# Patient Record
Sex: Male | Born: 1965 | Race: White | Hispanic: No | Marital: Married | Smoking: Never smoker
Health system: Southern US, Community
[De-identification: ages and names within clinical notes are randomized; demographics above are authoritative.]

## PROBLEM LIST (undated history)

## (undated) DIAGNOSIS — D802 Selective deficiency of immunoglobulin A [IgA]: Secondary | ICD-10-CM

## (undated) DIAGNOSIS — I10 Essential (primary) hypertension: Secondary | ICD-10-CM

## (undated) DIAGNOSIS — E119 Type 2 diabetes mellitus without complications: Secondary | ICD-10-CM

## (undated) DIAGNOSIS — E785 Hyperlipidemia, unspecified: Secondary | ICD-10-CM

## (undated) DIAGNOSIS — H9190 Unspecified hearing loss, unspecified ear: Secondary | ICD-10-CM

## (undated) DIAGNOSIS — G4730 Sleep apnea, unspecified: Secondary | ICD-10-CM

## (undated) DIAGNOSIS — L03116 Cellulitis of left lower limb: Secondary | ICD-10-CM

## (undated) DIAGNOSIS — Z8719 Personal history of other diseases of the digestive system: Secondary | ICD-10-CM

## (undated) DIAGNOSIS — F32A Depression, unspecified: Secondary | ICD-10-CM

## (undated) DIAGNOSIS — L02416 Cutaneous abscess of left lower limb: Secondary | ICD-10-CM

## (undated) DIAGNOSIS — M1990 Unspecified osteoarthritis, unspecified site: Secondary | ICD-10-CM

## (undated) DIAGNOSIS — F329 Major depressive disorder, single episode, unspecified: Secondary | ICD-10-CM

## (undated) HISTORY — PX: TOOTH EXTRACTION: SUR596

---

## 2005-07-16 ENCOUNTER — Emergency Department: Payer: Self-pay | Admitting: Emergency Medicine

## 2006-02-24 ENCOUNTER — Emergency Department: Payer: Self-pay | Admitting: Emergency Medicine

## 2006-03-02 ENCOUNTER — Emergency Department: Payer: Self-pay | Admitting: Emergency Medicine

## 2006-11-27 ENCOUNTER — Ambulatory Visit: Payer: Self-pay | Admitting: Family Medicine

## 2006-12-27 ENCOUNTER — Ambulatory Visit: Payer: Self-pay | Admitting: Internal Medicine

## 2007-01-03 ENCOUNTER — Ambulatory Visit: Payer: Self-pay | Admitting: Internal Medicine

## 2007-01-07 ENCOUNTER — Ambulatory Visit: Payer: Self-pay | Admitting: Internal Medicine

## 2007-02-02 ENCOUNTER — Ambulatory Visit: Payer: Self-pay

## 2007-12-08 ENCOUNTER — Emergency Department: Payer: Self-pay | Admitting: Emergency Medicine

## 2010-10-02 ENCOUNTER — Ambulatory Visit: Payer: Self-pay | Admitting: Family Medicine

## 2010-10-04 ENCOUNTER — Ambulatory Visit: Payer: Self-pay | Admitting: Family Medicine

## 2010-10-06 ENCOUNTER — Ambulatory Visit: Payer: Self-pay | Admitting: Family Medicine

## 2011-12-07 ENCOUNTER — Ambulatory Visit: Payer: Self-pay | Admitting: Family Medicine

## 2016-10-22 ENCOUNTER — Emergency Department: Payer: BLUE CROSS/BLUE SHIELD

## 2016-10-22 ENCOUNTER — Inpatient Hospital Stay
Admission: EM | Admit: 2016-10-22 | Discharge: 2016-10-26 | DRG: 603 | Disposition: A | Discharge status: Home / Self Care | Payer: BLUE CROSS/BLUE SHIELD | Attending: Specialist | Admitting: Specialist

## 2016-10-22 DIAGNOSIS — Z888 Allergy status to other drugs, medicaments and biological substances status: Secondary | ICD-10-CM | POA: Diagnosis not present

## 2016-10-22 DIAGNOSIS — I10 Essential (primary) hypertension: Secondary | ICD-10-CM | POA: Diagnosis present

## 2016-10-22 DIAGNOSIS — Z833 Family history of diabetes mellitus: Secondary | ICD-10-CM

## 2016-10-22 DIAGNOSIS — E782 Mixed hyperlipidemia: Secondary | ICD-10-CM | POA: Diagnosis present

## 2016-10-22 DIAGNOSIS — Z7984 Long term (current) use of oral hypoglycemic drugs: Secondary | ICD-10-CM | POA: Diagnosis not present

## 2016-10-22 DIAGNOSIS — L0390 Cellulitis, unspecified: Secondary | ICD-10-CM | POA: Diagnosis present

## 2016-10-22 DIAGNOSIS — E785 Hyperlipidemia, unspecified: Secondary | ICD-10-CM | POA: Diagnosis present

## 2016-10-22 DIAGNOSIS — E119 Type 2 diabetes mellitus without complications: Secondary | ICD-10-CM | POA: Diagnosis present

## 2016-10-22 DIAGNOSIS — L03116 Cellulitis of left lower limb: Secondary | ICD-10-CM | POA: Diagnosis present

## 2016-10-22 DIAGNOSIS — G4733 Obstructive sleep apnea (adult) (pediatric): Secondary | ICD-10-CM | POA: Diagnosis present

## 2016-10-22 DIAGNOSIS — F329 Major depressive disorder, single episode, unspecified: Secondary | ICD-10-CM | POA: Diagnosis present

## 2016-10-22 DIAGNOSIS — Z6841 Body Mass Index (BMI) 40.0 and over, adult: Secondary | ICD-10-CM | POA: Diagnosis not present

## 2016-10-22 DIAGNOSIS — Z881 Allergy status to other antibiotic agents status: Secondary | ICD-10-CM

## 2016-10-22 DIAGNOSIS — N179 Acute kidney failure, unspecified: Secondary | ICD-10-CM | POA: Diagnosis present

## 2016-10-22 DIAGNOSIS — Z9989 Dependence on other enabling machines and devices: Secondary | ICD-10-CM | POA: Diagnosis not present

## 2016-10-22 DIAGNOSIS — N400 Enlarged prostate without lower urinary tract symptoms: Secondary | ICD-10-CM | POA: Diagnosis present

## 2016-10-22 DIAGNOSIS — Z794 Long term (current) use of insulin: Secondary | ICD-10-CM

## 2016-10-22 HISTORY — DX: Essential (primary) hypertension: I10

## 2016-10-22 HISTORY — DX: Sleep apnea, unspecified: G47.30

## 2016-10-22 HISTORY — DX: Type 2 diabetes mellitus without complications: E11.9

## 2016-10-22 HISTORY — DX: Depression, unspecified: F32.A

## 2016-10-22 HISTORY — DX: Hyperlipidemia, unspecified: E78.5

## 2016-10-22 HISTORY — DX: Major depressive disorder, single episode, unspecified: F32.9

## 2016-10-22 LAB — CBC WITH DIFFERENTIAL/PLATELET
BASOS ABS: 0 10*3/uL (ref 0–0.1)
BASOS PCT: 0 %
EOS ABS: 0 10*3/uL (ref 0–0.7)
EOS PCT: 0 %
HCT: 34 % — ABNORMAL LOW (ref 40.0–52.0)
Hemoglobin: 11.7 g/dL — ABNORMAL LOW (ref 13.0–18.0)
Lymphocytes Relative: 7 %
Lymphs Abs: 0.6 10*3/uL — ABNORMAL LOW (ref 1.0–3.6)
MCH: 29.3 pg (ref 26.0–34.0)
MCHC: 34.5 g/dL (ref 32.0–36.0)
MCV: 84.9 fL (ref 80.0–100.0)
MONO ABS: 0.8 10*3/uL (ref 0.2–1.0)
Monocytes Relative: 9 %
Neutro Abs: 7.5 10*3/uL — ABNORMAL HIGH (ref 1.4–6.5)
Neutrophils Relative %: 84 %
PLATELETS: 177 10*3/uL (ref 150–440)
RBC: 4.01 MIL/uL — ABNORMAL LOW (ref 4.40–5.90)
RDW: 14.7 % — AB (ref 11.5–14.5)
WBC: 8.9 10*3/uL (ref 3.8–10.6)

## 2016-10-22 LAB — COMPREHENSIVE METABOLIC PANEL
ALT: 28 U/L (ref 17–63)
AST: 33 U/L (ref 15–41)
Albumin: 3.4 g/dL — ABNORMAL LOW (ref 3.5–5.0)
Alkaline Phosphatase: 38 U/L (ref 38–126)
Anion gap: 8 (ref 5–15)
BILIRUBIN TOTAL: 0.7 mg/dL (ref 0.3–1.2)
BUN: 49 mg/dL — AB (ref 6–20)
CO2: 26 mmol/L (ref 22–32)
CREATININE: 2.29 mg/dL — AB (ref 0.61–1.24)
Calcium: 9.2 mg/dL (ref 8.9–10.3)
Chloride: 101 mmol/L (ref 101–111)
GFR calc Af Amer: 37 mL/min — ABNORMAL LOW (ref >60)
GFR, EST NON AFRICAN AMERICAN: 32 mL/min — AB (ref >60)
Glucose, Bld: 139 mg/dL — ABNORMAL HIGH (ref 65–99)
Potassium: 3.5 mmol/L (ref 3.5–5.1)
Sodium: 135 mmol/L (ref 135–145)
TOTAL PROTEIN: 6.8 g/dL (ref 6.5–8.1)

## 2016-10-22 LAB — URINALYSIS, COMPLETE (UACMP) WITH MICROSCOPIC
Bacteria, UA: NONE SEEN
Bilirubin Urine: NEGATIVE
GLUCOSE, UA: NEGATIVE mg/dL
Hgb urine dipstick: NEGATIVE
Ketones, ur: NEGATIVE mg/dL
Leukocytes, UA: NEGATIVE
Nitrite: NEGATIVE
PROTEIN: NEGATIVE mg/dL
Specific Gravity, Urine: 1.017 (ref 1.005–1.030)
Squamous Epithelial / LPF: NONE SEEN
pH: 5 (ref 5.0–8.0)

## 2016-10-22 LAB — GLUCOSE, CAPILLARY: GLUCOSE-CAPILLARY: 165 mg/dL — AB (ref 65–99)

## 2016-10-22 LAB — LIPASE, BLOOD: LIPASE: 27 U/L (ref 11–51)

## 2016-10-22 LAB — INFLUENZA PANEL BY PCR (TYPE A & B)
INFLBPCR: NEGATIVE
Influenza A By PCR: NEGATIVE

## 2016-10-22 MED ORDER — SODIUM CHLORIDE 0.9 % IV BOLUS (SEPSIS)
1000.0000 mL | Freq: Once | INTRAVENOUS | Status: AC
  Administered 2016-10-22: 1000 mL via INTRAVENOUS

## 2016-10-22 MED ORDER — DEXTROSE 5 % IV SOLN
2.0000 g | Freq: Three times a day (TID) | INTRAVENOUS | Status: DC
  Filled 2016-10-22 (×2): qty 2

## 2016-10-22 MED ORDER — LEVOFLOXACIN IN D5W 750 MG/150ML IV SOLN: 750.0000 mg | INTRAVENOUS | Status: DC

## 2016-10-22 MED ORDER — METOPROLOL TARTRATE 50 MG PO TABS
50.0000 mg | ORAL_TABLET | Freq: Two times a day (BID) | ORAL | Status: DC
  Administered 2016-10-23 – 2016-10-26 (×8): 50 mg via ORAL
  Filled 2016-10-22 (×8): qty 1

## 2016-10-22 MED ORDER — IOPAMIDOL (ISOVUE-300) INJECTION 61%: 30.0000 mL | Freq: Once | INTRAVENOUS | Status: DC | PRN

## 2016-10-22 MED ORDER — TRAZODONE HCL 50 MG PO TABS
150.0000 mg | ORAL_TABLET | Freq: Every day | ORAL | Status: DC
  Administered 2016-10-23 – 2016-10-25 (×4): 150 mg via ORAL
  Filled 2016-10-22 (×5): qty 3

## 2016-10-22 MED ORDER — METOCLOPRAMIDE HCL 5 MG/ML IJ SOLN
10.0000 mg | Freq: Once | INTRAMUSCULAR | Status: AC
  Administered 2016-10-22: 10 mg via INTRAVENOUS
  Filled 2016-10-22: qty 2

## 2016-10-22 MED ORDER — LEVOFLOXACIN IN D5W 750 MG/150ML IV SOLN
750.0000 mg | Freq: Once | INTRAVENOUS | Status: AC
  Administered 2016-10-22: 750 mg via INTRAVENOUS
  Filled 2016-10-22: qty 150

## 2016-10-22 MED ORDER — SODIUM CHLORIDE 0.9 % IV SOLN
1.0000 g | Freq: Three times a day (TID) | INTRAVENOUS | Status: DC
  Administered 2016-10-23 (×2): 1 g via INTRAVENOUS
  Filled 2016-10-22 (×4): qty 1

## 2016-10-22 MED ORDER — ONDANSETRON HCL 4 MG/2ML IJ SOLN: 4.0000 mg | Freq: Four times a day (QID) | INTRAMUSCULAR | Status: DC | PRN

## 2016-10-22 MED ORDER — AMLODIPINE BESYLATE 10 MG PO TABS
10.0000 mg | ORAL_TABLET | Freq: Every day | ORAL | Status: DC
  Administered 2016-10-23 – 2016-10-26 (×4): 10 mg via ORAL
  Filled 2016-10-22 (×4): qty 1

## 2016-10-22 MED ORDER — ACETAMINOPHEN 325 MG PO TABS
650.0000 mg | ORAL_TABLET | Freq: Four times a day (QID) | ORAL | Status: DC | PRN
  Filled 2016-10-22: qty 2

## 2016-10-22 MED ORDER — TERAZOSIN HCL 5 MG PO CAPS
10.0000 mg | ORAL_CAPSULE | Freq: Every day | ORAL | Status: DC
  Administered 2016-10-23 – 2016-10-26 (×4): 10 mg via ORAL
  Filled 2016-10-22 (×4): qty 2

## 2016-10-22 MED ORDER — ACETAMINOPHEN 500 MG PO TABS
1000.0000 mg | ORAL_TABLET | Freq: Once | ORAL | Status: AC
  Administered 2016-10-22: 1000 mg via ORAL
  Filled 2016-10-22: qty 2

## 2016-10-22 MED ORDER — SODIUM CHLORIDE 0.9 % IV SOLN
1250.0000 mg | INTRAVENOUS | Status: DC
  Filled 2016-10-22: qty 1250

## 2016-10-22 MED ORDER — RAMIPRIL 10 MG PO CAPS
10.0000 mg | ORAL_CAPSULE | Freq: Two times a day (BID) | ORAL | Status: DC
  Administered 2016-10-23 – 2016-10-26 (×8): 10 mg via ORAL
  Filled 2016-10-22 (×10): qty 1

## 2016-10-22 MED ORDER — VANCOMYCIN HCL IN DEXTROSE 1-5 GM/200ML-% IV SOLN
1000.0000 mg | Freq: Once | INTRAVENOUS | Status: AC
  Administered 2016-10-22: 1000 mg via INTRAVENOUS
  Filled 2016-10-22: qty 200

## 2016-10-22 MED ORDER — VANCOMYCIN HCL 10 G IV SOLR
1250.0000 mg | INTRAVENOUS | Status: DC
  Administered 2016-10-23: 02:00:00 1250 mg via INTRAVENOUS
  Filled 2016-10-22 (×2): qty 1250

## 2016-10-22 MED ORDER — ACETAMINOPHEN 650 MG RE SUPP: 650.0000 mg | Freq: Four times a day (QID) | RECTAL | Status: DC | PRN

## 2016-10-22 MED ORDER — OXYCODONE HCL 5 MG PO TABS: 5.0000 mg | ORAL_TABLET | ORAL | Status: DC | PRN

## 2016-10-22 MED ORDER — INSULIN ASPART 100 UNIT/ML ~~LOC~~ SOLN
0.0000 [IU] | Freq: Three times a day (TID) | SUBCUTANEOUS | Status: DC
  Administered 2016-10-23 (×3): 2 [IU] via SUBCUTANEOUS
  Administered 2016-10-23 – 2016-10-24 (×2): 1 [IU] via SUBCUTANEOUS
  Administered 2016-10-24 – 2016-10-25 (×6): 2 [IU] via SUBCUTANEOUS
  Administered 2016-10-25: 09:00:00 1 [IU] via SUBCUTANEOUS
  Administered 2016-10-26 (×2): 2 [IU] via SUBCUTANEOUS
  Filled 2016-10-22 (×14): qty 1

## 2016-10-22 MED ORDER — SODIUM CHLORIDE 0.9 % IV SOLN
INTRAVENOUS | Status: AC
  Administered 2016-10-23: 02:00:00 via INTRAVENOUS

## 2016-10-22 MED ORDER — HEPARIN SODIUM (PORCINE) 5000 UNIT/ML IJ SOLN
5000.0000 [IU] | Freq: Three times a day (TID) | INTRAMUSCULAR | Status: DC
  Administered 2016-10-23: 5000 [IU] via SUBCUTANEOUS
  Filled 2016-10-22: qty 1

## 2016-10-22 MED ORDER — ONDANSETRON HCL 4 MG PO TABS: 4.0000 mg | ORAL_TABLET | Freq: Four times a day (QID) | ORAL | Status: DC | PRN

## 2016-10-22 MED ORDER — DEXTROSE 5 % IV SOLN
2.0000 g | Freq: Once | INTRAVENOUS | Status: AC
  Administered 2016-10-22: 2 g via INTRAVENOUS
  Filled 2016-10-22: qty 2

## 2016-10-22 NOTE — ED Triage Notes (Signed)
Pt to ED c/o multiple complaints. C/o bilateral leg pain, reports has been running fevers. Also has hip pain, fell last week off the steps. Temperature 99.0 in Ed.

## 2016-10-22 NOTE — ED Notes (Signed)
Last BM on Friday. Pt states he was recently on a long car trip at the beginning of Sept from [place].

## 2016-10-22 NOTE — Progress Notes (Addendum)
Pharmacy Antibiotic Note  Patrick Duncan is a 51 y.o. male admitted on 10/22/2016 with cellulitis.  Pharmacy has been consulted for vancomycin, levofloxacin, and aztreonam dosing.  Plan: 1. Vancomycin 1 gm IV x 1 followed in approximately 6 hours (stacked dosing) by vancomycin 1.25 gm IV Q18H, predicted trough 12 mcg/mL. Pharmacy will continue to follow and adjust as needed to maintain trough 10 to 15 mcg/mL (no abscess noted at this time).   Vd 69.7 L, 0.05 hr-1, T1/2 13.7 hr  2. Levofloxacin 750 mg IV Q24H 3. Aztreonam 2 gm IV Q8H  Aztreonam and Levaquin changed to meropenem per attending.  Height: 5\' 7"  (170.2 cm) Weight: (!) 330 lb (149.7 kg) IBW/kg (Calculated) : 66.1  Temp (24hrs), Avg:99 F (37.2 C), Min:99 F (37.2 C), Max:99 F (37.2 C)   Recent Labs Lab 10/22/16 1620  WBC 8.9  CREATININE 2.29*    Estimated Creatinine Clearance: 55.5 mL/min (A) (by C-G formula based on SCr of 2.29 mg/dL (H)).    Allergies  Allergen Reactions  . Ampicillin Diarrhea  . Claritin [Loratadine] Other (See Comments)    Irritates throat    Thank you for allowing pharmacy to be a part of this patient's care.  Laural Benes, Pharm.D., BCPS Clinical Pharmacist 10/22/2016 6:21 PM

## 2016-10-22 NOTE — ED Notes (Signed)
Pt finished with contrast, attempted to call CT, no answer.

## 2016-10-22 NOTE — Progress Notes (Signed)
Azactam 2 gm tubed to ED main

## 2016-10-22 NOTE — ED Provider Notes (Signed)
[facility] Emergency Department Provider Note  ____________________________________________  Time seen: Approximately 3:49 PM  I have reviewed the triage vital signs and the nursing notes.   HISTORY  Chief Complaint Leg Pain and Headache    HPI Patrick Duncan is a 51 y.o. male with multiple complaints that started yesterday. He reports low back pain, I'm really worsen the right, bilateral lower leg pain, fever to 100.6 yesterday. Also has some mild shortness of breath and nonproductive cough and a generalized headache. Also has generalized abdominal pain. Not sure if his abdomen is more distended. No vomiting. Last bowel movement 2 days ago.  Recently on a long car trip driving back from [place] about a month ago. On returning home he fell off the stairs bruising the right side of his body.     Past Medical History:  Diagnosis Date  . Diabetes mellitus without complication ([place])   . Hypertension   morbid obesity Obstructive sleep apnea    There are no active problems to display for this patient.    History reviewed. No pertinent surgical history. tooth extraction  Prior to Admission medications   Medication Sig Start Date End Date Taking? Authorizing Provider  amLODipine (NORVASC) 10 MG tablet Take 10 mg by mouth daily. 05/18/16  Yes [provider]  chlorthalidone (HYGROTON) 25 MG tablet Take 25 mg by mouth daily. 05/18/16  Yes [provider]  fexofenadine (ALLEGRA) 180 MG tablet Take 180 mg by mouth daily.   Yes [provider]  fluticasone (FLONASE) 50 MCG/ACT nasal spray Place 2 sprays into both nostrils daily as needed. 05/04/16  Yes [provider]  glimepiride (AMARYL) 1 MG tablet Take 1 mg by mouth daily. 09/22/16  Yes [provider]  metFORMIN (GLUCOPHAGE-XR) 500 MG 24 hr tablet Take 500 mg by mouth 2 (two) times daily. 07/31/16  Yes [provider]  metoprolol tartrate (LOPRESSOR) 50  MG tablet Take 50 mg by mouth 2 (two) times daily. 08/23/16  Yes [provider]  pioglitazone (ACTOS) 30 MG tablet Take 30 mg by mouth daily. 07/27/16  Yes [provider]  pseudoephedrine-guaifenesin (MUCINEX D) 60-600 MG 12 hr tablet Take 1 tablet by mouth every 12 (twelve) hours.   Yes [provider]  ramipril (ALTACE) 10 MG capsule Take 10 mg by mouth 2 (two) times daily. 05/18/16  Yes [provider]  terazosin (HYTRIN) 10 MG capsule Take 10 mg by mouth daily.  05/18/16  Yes [provider]  traZODone (DESYREL) 150 MG tablet Take 150 mg by mouth at bedtime. 09/06/16  Yes [provider]  triamcinolone ointment (KENALOG) 0.1 % Apply 1 application topically daily. 10/18/16  Yes [provider]  trolamine salicylate (ASPERCREME) 10 % cream Apply 1 application topically as needed for muscle pain.   Yes [provider]  vitamin B-12 (CYANOCOBALAMIN) 1000 MCG tablet Take 1,000 mcg by mouth daily.   Yes [provider]  Current Medications   Prescription Sig. Disp. Refills Start Date End Date Status  fexofenadine (ALLEGRA) 180 MG tablet  Take 180 mg by mouth once daily.     Active  naproxen sodium (ALEVE, ANAPROX) 220 MG tablet  Take 220 mg by mouth as needed for Pain.     Active  cyanocobalamin (VITAMIN B12) 1000 MCG tablet  Take 1,000 mcg by mouth once daily.     Active  dextromethorphan-guaifenesin (MUCINEX DM) 30-600 mg ER tablet  Take 1 tablet by mouth as needed.  Active  fluticasone (FLONASE) 50 mcg/actuation nasal spray  USE 2 SPRAYS INTO EACH NOSTRIL ONCE DAILY AS DIRECTED BY PHYSICIAN. 16 g  16 05/04/2016  Active  chlorthalidone 25 MG tablet  Indications: Hypertension, essential, benign TAKE ONE (1) TABLET BY MOUTH ONCE DAILY 30 tablet  6 05/18/2016  Active  amLODIPine (NORVASC) 10 MG tablet  Indications: Hypertension, essential, benign TAKE ONE TABLET EVERY MORNING 30 tablet  6  05/18/2016  Active  ramipril (ALTACE) 10 MG capsule  Indications: Hypertension, essential, benign TAKE ONE CAPSULE TWICE A DAY 60 capsule  6 05/18/2016  Active  terazosin (HYTRIN) 10 MG capsule  Indications: Hypertension, essential, benign TAKE ONE (1) CAPSULE EACH DAY. 30 capsule  6 05/18/2016  Active  fenofibrate 160 MG tablet  Indications: Mixed hyperlipidemia TAKE ONE (1) TABLET BY MOUTH ONCE DAILY 30 tablet  6 06/01/2016  Active  HYDROcodone-acetaminophen (NORCO) 5-325 mg tablet  Take 1 tablet by mouth every 4 (four) hours as needed for Pain for up to 20 doses. 20 tablet  0 06/20/2016  Active  pioglitazone (ACTOS) 30 MG tablet  TAKE ONE (1) TABLET BY MOUTH ONCE DAILY 30 tablet  3 07/27/2016  Active  metFORMIN (GLUCOPHAGE-XR) 500 MG XR tablet  Take 1 tablet (500 mg total) by mouth 2 (two) times daily. 180 tablet  0 07/31/2016  Active  metoprolol tartrate (LOPRESSOR) 50 MG tablet  Indications: Hypertension, essential, benign TAKE ONE TABLET BY MOUTH TWICE DAILY. 60 tablet  3 08/23/2016  Active  traZODone (DESYREL) 150 MG tablet  Indications: Recurrent major depressive disorder, in partial remission (CMS-HCC) 1 TABLET BY MOUTH AT BEDTIME. 30 tablet  2 09/06/2016  Active  glimepiride (AMARYL) 1 MG tablet             Allergies Ampicillin and Claritin [loratadine] glyburide  No family history on file.  Social History Social History  Substance Use Topics  . Smoking status: Never Smoker  . Smokeless tobacco: Never Used  . Alcohol use No    Review of Systems  Constitutional:   positive fever and chills.  ENT:   No sore throat. No rhinorrhea. Cardiovascular:   No chest pain or syncope. Respiratory:   positive shortness of breath and nonproductive cough. Gastrointestinal:   positive generalized abdominal pain without vomiting and diarrhea.  Musculoskeletal:   positive bilateral lower leg pain, left greater than right. All other systems reviewed and are  negative except as documented above in ROS and HPI.  ____________________________________________   PHYSICAL EXAM:  VITAL SIGNS: ED Triage Vitals  Enc Vitals Group     BP 10/22/16 1514 (!) 161/66     Pulse Rate 10/22/16 1514 97     Resp 10/22/16 1514 18     Temp 10/22/16 1514 99 F (37.2 C)     Temp Source 10/22/16 1514 Oral     SpO2 10/22/16 1514 97 %     Weight 10/22/16 1515 (!) 330 lb (149.7 kg)     Height 10/22/16 1515 5\' 7"  (1.702 m)     Head Circumference --      Peak Flow --      Pain Score --      Pain Loc --      Pain Edu? --      Excl. in [place]? --     Vital signs reviewed, nursing assessments reviewed.   Constitutional:   Alert and oriented. Well appearing and in no distress. Eyes:   No scleral icterus.  EOMI. No nystagmus. No conjunctival  pallor. PERRL. ENT   Head:   Normocephalic and atraumatic.   Nose:   No congestion/rhinnorhea.    Mouth/Throat:   dry mucous membranes, no pharyngeal erythema. No peritonsillar mass.    Neck:   No meningismus. Full ROM. Hematological/Lymphatic/Immunilogical:   No cervical lymphadenopathy. Cardiovascular:   RRR. Symmetric bilateral radial and DP pulses.  No murmurs.  Respiratory:   Normal respiratory effort without tachypnea/retractions. Breath sounds are clear and equal bilaterally. No wheezes/rales/rhonchi. Gastrointestinal:   Soft ROS tenderness. Moderately distended with some tympany.. There is no CVA tenderness.  No rebound, rigidity, or guarding. Genitourinary:   deferred Musculoskeletal:   Normal range of motion in all extremities. No joint effusions.  No lower extremity tenderness.  No edema.no midline spinal tenderness. There is tenderness in the musculature of the right lower back. Left lower extremity has increased calf circumference, 2+ pitting edema, erythema and tenderness over the lower leg. No edema on the right leg. Neurologic:   Normal speech and language.  Motor grossly intact. No gross focal  neurologic deficits are appreciated.  Skin:    Skin is warm, dry with left leg erythema as above. There is scattered insect lesions on the right lower leg which are being treated by his dermatologist.  ____________________________________________    Reva Bores (pertinent positives/negatives) (all labs ordered are listed, but only abnormal results are displayed) Labs Reviewed  COMPREHENSIVE METABOLIC PANEL - Abnormal; Notable for the following:       Result Value   Glucose, Bld 139 (*)    BUN 49 (*)    Creatinine, Ser 2.29 (*)    Albumin 3.4 (*)    GFR calc non Af Amer 32 (*)    GFR calc Af Amer 37 (*)    All other components within normal limits  CBC WITH DIFFERENTIAL/PLATELET - Abnormal; Notable for the following:    RBC 4.01 (*)    Hemoglobin 11.7 (*)    HCT 34.0 (*)    RDW 14.7 (*)    Neutro Abs 7.5 (*)    Lymphs Abs 0.6 (*)    All other components within normal limits  URINALYSIS, COMPLETE (UACMP) WITH MICROSCOPIC - Abnormal; Notable for the following:    Color, Urine YELLOW (*)    APPearance CLEAR (*)    All other components within normal limits  CULTURE, BLOOD (ROUTINE X 2)  CULTURE, BLOOD (ROUTINE X 2)  LIPASE, BLOOD  INFLUENZA PANEL BY PCR (TYPE A & B)   ____________________________________________   EKG  interpreted by me Sinus rhythm rate of 94, normal axis and intervals. Normal QRS ST segments and T waves.  ____________________________________________    RADIOLOGY  Ct Abdomen Pelvis Wo Contrast  Result Date: 10/22/2016 CLINICAL DATA:  51 y/o M; back pain and right leg pain since yesterday. Low-grade fever. EXAM: CT ABDOMEN AND PELVIS WITHOUT CONTRAST TECHNIQUE: Multidetector CT imaging of the abdomen and pelvis was performed following the standard protocol without IV contrast. COMPARISON:  10/06/2010 CT abdomen and pelvis. Left lower extremity venous ultrasound dated 10/22/2016. FINDINGS: Lower chest: No acute abnormality. Hepatobiliary: No focal liver  abnormality is seen. Hepatic steatosis. No gallstones, gallbladder wall thickening, or biliary dilatation. Pancreas: Unremarkable. No pancreatic ductal dilatation or surrounding inflammatory changes. Spleen: Normal in size without focal abnormality. Adrenals/Urinary Tract: Adrenal glands are unremarkable. Kidneys are normal, without renal calculi, focal lesion, or hydronephrosis. Bladder is unremarkable. Stomach/Bowel: Stomach is within normal limits. Appendix appears normal. No evidence of bowel wall thickening, distention, or inflammatory changes. Vascular/Lymphatic: Left-greater-than-right  iliofemoral mild lymphadenopathy and surrounding retroperitoneal edema overlying the iliacus muscles bilaterally. On the left edema extends into the left groin along the iliofemoral vessels. Reproductive: Prostate is unremarkable. Other: Small paraumbilical hernia containing fat. No abdominopelvic ascites. Musculoskeletal: Mild osteoarthrosis of the hip joints. No acute fracture. IMPRESSION: 1. Mild left-greater-than-right iliofemoral lymphadenopathy and surrounding edema of uncertain significance. Findings probably represent lymphadenitis, possibly related to an infectious or inflammatory process of the groin or lower extremities. Edema follows the left iliofemoral vessels which may be tracking along lymphatics or be related to underlying vasculitis/ vessel thrombosis. 2. Hepatic steatosis. Electronically Signed   By: Kristine Garbe M.D.   On: 10/22/2016 19:45   Dg Chest 2 View  Result Date: 10/22/2016 CLINICAL DATA:  Dyspnea and cough EXAM: CHEST  2 VIEW COMPARISON:  07/16/2005 chest radiograph. FINDINGS: Mild enlargement of the cardiac silhouette. Otherwise normal mediastinal contour. No pneumothorax. No pleural effusion. Mild pulmonary edema. No acute consolidative airspace disease. IMPRESSION: Mild congestive heart failure. Electronically Signed   By: Ilona Sorrel M.D.   On: 10/22/2016 16:30   US Venous Img  Lower Unilateral Left  Result Date: 10/22/2016 CLINICAL DATA:  Left lower extremity swelling and pain since last night EXAM: LEFT LOWER EXTREMITY VENOUS DOPPLER ULTRASOUND TECHNIQUE: Gray-scale sonography with graded compression, as well as color Doppler and duplex ultrasound were performed to evaluate the lower extremity deep venous systems from the level of the common femoral vein and including the common femoral, femoral, profunda femoral, popliteal and calf veins including the posterior tibial, peroneal and gastrocnemius veins when visible. The superficial great saphenous vein was also interrogated. Spectral Doppler was utilized to evaluate flow at rest and with distal augmentation maneuvers in the common femoral, femoral and popliteal veins. COMPARISON:  None. FINDINGS: Contralateral Common Femoral Vein: Respiratory phasicity is normal and symmetric with the symptomatic side. No evidence of thrombus. Normal compressibility. Common Femoral Vein: No evidence of thrombus. Normal compressibility, respiratory phasicity and response to augmentation. Saphenofemoral Junction: No evidence of thrombus. Normal compressibility and flow on color Doppler imaging. Profunda Femoral Vein: No evidence of thrombus. Normal compressibility and flow on color Doppler imaging. Femoral Vein: No evidence of thrombus. Normal compressibility, respiratory phasicity and response to augmentation. Popliteal Vein: No evidence of thrombus. Normal compressibility, respiratory phasicity and response to augmentation. Calf Veins: No evidence of thrombus. Normal compressibility and flow on color Doppler imaging. Superficial Great Saphenous Vein: No evidence of thrombus. Normal compressibility and flow on color Doppler imaging. Venous Reflux:  None. Other Findings: No significant abnormalities. A measured 1.0 cm short axis left inguinal lymph node demonstrates no overtly suspicious sonographic features. IMPRESSION: No evidence of DVT within the  left lower extremity. Electronically Signed   By: Ilona Sorrel M.D.   On: 10/22/2016 17:22    ____________________________________________   PROCEDURES Procedures  ____________________________________________   INITIAL IMPRESSION / ASSESSMENT AND PLAN / ED COURSE  Pertinent labs & imaging results that were available during my care of the patient were reviewed by me and considered in my medical decision making (see chart for details).  As part of my medical decision making, I reviewed the following data within the [facility] records, imaging reports, lab results, radiology reports and I personally reviewed the following imaging studies: radiograph(s), CT scan(s) and Ultrasound(s).   patient presents with constellation of symptoms for which the differential is broad, including viral syndrome including influenza, intra-abdominal process such as perforation, obstruction, appendicitis, pancreatitis, or diverticulitis, left leg cellulitis  or DVT. Due to his morbid obesity and diabetes, the patient is also at higher risk for medical complications.we'll check labs, chest x-ray, CT abdomen and pelvis, ultrasound left leg. IV fluids for hydration.  Clinical Course as of Oct 23 2023  Sun Oct 22, 2016  1719 Baseline creatinine 0.7 in EMR. CT without IV contrast. Continue ivf for acute renal insufficiency Creatinine: (!) 2.29 [PS]  1757 [place] RLE negative. Will start vancomycin for cellulitis.  [PS]  1850 Substantial delay in obtaining CT. Will give broad abx in the meantime.   [PS]    Clinical Course User Index [PS] Carrie Mew, MD     ----------------------------------------- 8:25 PM on 10/22/2016 -----------------------------------------  CT negative for any serious intra-abdominal pathology. Shows some inflammatory changes consistent with the distal left leg cellulitis. Patient and family updated on results, plan for admission. Case discussed with  the hospitalist.  ____________________________________________   FINAL CLINICAL IMPRESSION(S) / ED DIAGNOSES  Final diagnoses:  Left leg cellulitis  Acute renal failure, unspecified acute renal failure type [facility])      New Prescriptions   No medications on file     Portions of this note were generated with dragon dictation software. Dictation errors may occur despite best attempts at proofreading.    Carrie Mew, MD 10/22/16 2025

## 2016-10-22 NOTE — ED Notes (Addendum)
Pt states he started having back pain and right leg pain since yesterday. Pt also states he had a fever at work of 100.6 Pt states he is having left hip pain also radiating down left leg.  Pt does have swelling to left lower leg and redness present.  Pt c/o pain when coughing and walking. Pt also c/o headache since last night that he describes as feeling like a "mac truck" hitting him.  Pt has been eating and drinking at home. Pt states he did have some shortness of breath yesterday. Pt wears a CPAP at night.

## 2016-10-22 NOTE — H&P (Signed)
[facility] NAME: Patrick Duncan    MR#:  [id number]  DATE OF BIRTH:  May 20, 1965  DATE OF ADMISSION:  10/22/2016  PRIMARY CARE PHYSICIAN: Sofie Hartigan, MD   REQUESTING/REFERRING PHYSICIAN: Joni Fears, MD  CHIEF COMPLAINT:   Chief Complaint  Patient presents with  . Leg Pain  . Headache    HISTORY OF PRESENT ILLNESS:  Patrick Duncan  is a 51 y.o. male who presents with Left lower extremity pain, erythema, warmth for the past 24 hours or so. Workup in the ED showed no DVT, treatment for cellulitis was initiated and hospitalists were called for admission  PAST MEDICAL HISTORY:   Past Medical History:  Diagnosis Date  . Depression   . Diabetes mellitus without complication ([facility])   . HLD (hyperlipidemia)   . Hypertension   . Sleep apnea     PAST SURGICAL HISTORY:   Past Surgical History:  Procedure Laterality Date  . TOOTH EXTRACTION      SOCIAL HISTORY:   Social History  Substance Use Topics  . Smoking status: Never Smoker  . Smokeless tobacco: Never Used  . Alcohol use No    FAMILY HISTORY:   Family History  Problem Relation Age of Onset  . Benign prostatic hyperplasia Father   . Psoriasis Father   . Hypertension Mother   . Hyperlipidemia Mother   . Diabetes Maternal Grandmother   . Diabetes Paternal Grandmother     DRUG ALLERGIES:   Allergies  Allergen Reactions  . Ampicillin Diarrhea  . Claritin [Loratadine] Other (See Comments)    Irritates throat    MEDICATIONS AT HOME:   Prior to Admission medications   Medication Sig Start Date End Date Taking? Authorizing Provider  amLODipine (NORVASC) 10 MG tablet Take 10 mg by mouth daily. 05/18/16  Yes [provider]  chlorthalidone (HYGROTON) 25 MG tablet Take 25 mg by mouth daily. 05/18/16  Yes [provider]  fexofenadine (ALLEGRA) 180 MG tablet Take 180 mg by mouth daily.   Yes [provider]  fluticasone  (FLONASE) 50 MCG/ACT nasal spray Place 2 sprays into both nostrils daily as needed. 05/04/16  Yes [provider]  glimepiride (AMARYL) 1 MG tablet Take 1 mg by mouth daily. 09/22/16  Yes [provider]  metFORMIN (GLUCOPHAGE-XR) 500 MG 24 hr tablet Take 500 mg by mouth 2 (two) times daily. 07/31/16  Yes [provider]  metoprolol tartrate (LOPRESSOR) 50 MG tablet Take 50 mg by mouth 2 (two) times daily. 08/23/16  Yes [provider]  pioglitazone (ACTOS) 30 MG tablet Take 30 mg by mouth daily. 07/27/16  Yes [provider]  pseudoephedrine-guaifenesin (MUCINEX D) 60-600 MG 12 hr tablet Take 1 tablet by mouth every 12 (twelve) hours.   Yes [provider]  ramipril (ALTACE) 10 MG capsule Take 10 mg by mouth 2 (two) times daily. 05/18/16  Yes [provider]  terazosin (HYTRIN) 10 MG capsule Take 10 mg by mouth daily.  05/18/16  Yes [provider]  traZODone (DESYREL) 150 MG tablet Take 150 mg by mouth at bedtime. 09/06/16  Yes [provider]  triamcinolone ointment (KENALOG) 0.1 % Apply 1 application topically daily. 10/18/16  Yes [provider]  trolamine salicylate (ASPERCREME) 10 % cream Apply 1 application topically as needed for muscle pain.   Yes [provider]  vitamin B-12 (CYANOCOBALAMIN) 1000 MCG tablet Take 1,000 mcg by mouth daily.   Yes [provider]    REVIEW OF SYSTEMS:  Review of Systems  Constitutional: Negative for chills, fever, malaise/fatigue and weight loss.  HENT: Negative for ear pain, hearing loss and tinnitus.   Eyes: Negative for blurred vision, double vision, pain and redness.  Respiratory: Negative for cough, hemoptysis and shortness of breath.   Cardiovascular: Positive for leg swelling. Negative for chest pain, palpitations and orthopnea.  Gastrointestinal: Negative for abdominal pain, constipation, diarrhea, nausea and vomiting.  Genitourinary: Negative for  dysuria, frequency and hematuria.  Musculoskeletal: Negative for back pain, joint pain and neck pain.  Skin:       Erythema and tenderness of his left leg  Neurological: Negative for dizziness, tremors, focal weakness and weakness.  Endo/Heme/Allergies: Negative for polydipsia. Does not bruise/bleed easily.  Psychiatric/Behavioral: Negative for depression. The patient is not nervous/anxious and does not have insomnia.      VITAL SIGNS:   Vitals:   10/22/16 1930 10/22/16 2000 10/22/16 2015 10/22/16 2030  BP: (!) 122/45 (!) 116/49  (!) 106/42  Pulse: 99 98 98   Resp: (!) 29 19 (!) 23 13  Temp:      TempSrc:      SpO2: 94% 92% 94%   Weight:      Height:       Wt Readings from Last 3 Encounters:  10/22/16 (!) 149.7 kg (330 lb)    PHYSICAL EXAMINATION:  Physical Exam  Vitals reviewed. Constitutional: He is oriented to person, place, and time. He appears well-developed and well-nourished. No distress.  HENT:  Head: Normocephalic and atraumatic.  Mouth/Throat: Oropharynx is clear and moist.  Eyes: Pupils are equal, round, and reactive to light. Conjunctivae and EOM are normal. No scleral icterus.  Neck: Normal range of motion. Neck supple. No JVD present. No thyromegaly present.  Cardiovascular: Normal rate, regular rhythm and intact distal pulses.  Exam reveals no gallop and no friction rub.   No murmur heard. Respiratory: Effort normal and breath sounds normal. No respiratory distress. He has no wheezes. He has no rales.  GI: Soft. Bowel sounds are normal. He exhibits no distension. There is no tenderness.  Musculoskeletal: Normal range of motion. He exhibits no edema.  No arthritis, no gout  Lymphadenopathy:    He has no cervical adenopathy.  Neurological: He is alert and oriented to person, place, and time. No cranial nerve deficit.  No dysarthria, no aphasia  Skin: Skin is warm and dry. No rash noted. There is erythema (With tenderness and swelling of his left lower  extremity).  Psychiatric: He has a normal mood and affect. His behavior is normal. Judgment and thought content normal.    LABORATORY PANEL:   CBC  Recent Labs Lab 10/22/16 1620  WBC 8.9  HGB 11.7*  HCT 34.0*  PLT 177   ------------------------------------------------------------------------------------------------------------------  Chemistries   Recent Labs Lab 10/22/16 1620  NA 135  K 3.5  CL 101  CO2 26  GLUCOSE 139*  BUN 49*  CREATININE 2.29*  CALCIUM 9.2  AST 33  ALT 28  ALKPHOS 38  BILITOT 0.7   ------------------------------------------------------------------------------------------------------------------  Cardiac Enzymes No results for input(s): TROPONINI in the last 168 hours. ------------------------------------------------------------------------------------------------------------------  RADIOLOGY:  Ct Abdomen Pelvis Wo Contrast  Result Date: 10/22/2016 CLINICAL DATA:  51 y/o M; back pain and right leg pain since yesterday. Low-grade fever. EXAM: CT ABDOMEN AND PELVIS WITHOUT CONTRAST TECHNIQUE: Multidetector CT imaging of the abdomen and pelvis was performed following the standard protocol without IV contrast. COMPARISON:  10/06/2010  CT abdomen and pelvis. Left lower extremity venous ultrasound dated 10/22/2016. FINDINGS: Lower chest: No acute abnormality. Hepatobiliary: No focal liver abnormality is seen. Hepatic steatosis. No gallstones, gallbladder wall thickening, or biliary dilatation. Pancreas: Unremarkable. No pancreatic ductal dilatation or surrounding inflammatory changes. Spleen: Normal in size without focal abnormality. Adrenals/Urinary Tract: Adrenal glands are unremarkable. Kidneys are normal, without renal calculi, focal lesion, or hydronephrosis. Bladder is unremarkable. Stomach/Bowel: Stomach is within normal limits. Appendix appears normal. No evidence of bowel wall thickening, distention, or inflammatory changes. Vascular/Lymphatic:  Left-greater-than-right iliofemoral mild lymphadenopathy and surrounding retroperitoneal edema overlying the iliacus muscles bilaterally. On the left edema extends into the left groin along the iliofemoral vessels. Reproductive: Prostate is unremarkable. Other: Small paraumbilical hernia containing fat. No abdominopelvic ascites. Musculoskeletal: Mild osteoarthrosis of the hip joints. No acute fracture. IMPRESSION: 1. Mild left-greater-than-right iliofemoral lymphadenopathy and surrounding edema of uncertain significance. Findings probably represent lymphadenitis, possibly related to an infectious or inflammatory process of the groin or lower extremities. Edema follows the left iliofemoral vessels which may be tracking along lymphatics or be related to underlying vasculitis/ vessel thrombosis. 2. Hepatic steatosis. Electronically Signed   By: Kristine Garbe M.D.   On: 10/22/2016 19:45   Dg Chest 2 View  Result Date: 10/22/2016 CLINICAL DATA:  Dyspnea and cough EXAM: CHEST  2 VIEW COMPARISON:  07/16/2005 chest radiograph. FINDINGS: Mild enlargement of the cardiac silhouette. Otherwise normal mediastinal contour. No pneumothorax. No pleural effusion. Mild pulmonary edema. No acute consolidative airspace disease. IMPRESSION: Mild congestive heart failure. Electronically Signed   By: Ilona Sorrel M.D.   On: 10/22/2016 16:30   US Venous Img Lower Unilateral Left  Result Date: 10/22/2016 CLINICAL DATA:  Left lower extremity swelling and pain since last night EXAM: LEFT LOWER EXTREMITY VENOUS DOPPLER ULTRASOUND TECHNIQUE: Gray-scale sonography with graded compression, as well as color Doppler and duplex ultrasound were performed to evaluate the lower extremity deep venous systems from the level of the common femoral vein and including the common femoral, femoral, profunda femoral, popliteal and calf veins including the posterior tibial, peroneal and gastrocnemius veins when visible. The superficial great  saphenous vein was also interrogated. Spectral Doppler was utilized to evaluate flow at rest and with distal augmentation maneuvers in the common femoral, femoral and popliteal veins. COMPARISON:  None. FINDINGS: Contralateral Common Femoral Vein: Respiratory phasicity is normal and symmetric with the symptomatic side. No evidence of thrombus. Normal compressibility. Common Femoral Vein: No evidence of thrombus. Normal compressibility, respiratory phasicity and response to augmentation. Saphenofemoral Junction: No evidence of thrombus. Normal compressibility and flow on color Doppler imaging. Profunda Femoral Vein: No evidence of thrombus. Normal compressibility and flow on color Doppler imaging. Femoral Vein: No evidence of thrombus. Normal compressibility, respiratory phasicity and response to augmentation. Popliteal Vein: No evidence of thrombus. Normal compressibility, respiratory phasicity and response to augmentation. Calf Veins: No evidence of thrombus. Normal compressibility and flow on color Doppler imaging. Superficial Great Saphenous Vein: No evidence of thrombus. Normal compressibility and flow on color Doppler imaging. Venous Reflux:  None. Other Findings: No significant abnormalities. A measured 1.0 cm short axis left inguinal lymph node demonstrates no overtly suspicious sonographic features. IMPRESSION: No evidence of DVT within the left lower extremity. Electronically Signed   By: Ilona Sorrel M.D.   On: 10/22/2016 17:22    EKG:   Orders placed or performed during the hospital encounter of 10/22/16  . ED EKG  . ED EKG  . EKG 12-Lead  . EKG 12-Lead  IMPRESSION AND PLAN:  Principal Problem:   Cellulitis - IV antibiotics Active Problems:   AKI (acute kidney injury) ([facility]) - gentle IV fluids tonight, avoid nephrotoxins and monitor for improvement   Diabetes ([facility]) - sliding scale insulin with corresponding glucose checks   HTN (hypertension) - continue home meds   HLD (hyperlipidemia)  - continue home meds   OSA (obstructive sleep apnea) - CPAP daily at bedtime  All the records are reviewed and case discussed with ED provider. Management plans discussed with the patient and/or family.  DVT PROPHYLAXIS: SubQ heparin  GI PROPHYLAXIS: None  ADMISSION STATUS: Inpatient  CODE STATUS: Full Code Status History    This patient does not have a recorded code status. Please follow your organizational policy for patients in this situation.      TOTAL TIME TAKING CARE OF THIS PATIENT: 45 minutes.   Georgi Tuel [place] 10/22/2016, 9:26 PM  [facility]  Office  [phone]  CC: Primary care physician; Sofie Hartigan, MD  Note:  This document was prepared using Dragon voice recognition software and may include unintentional dictation errors.

## 2016-10-22 NOTE — ED Notes (Signed)
CT notified pt finished with contrast. 

## 2016-10-23 LAB — CBC
HCT: 31 % — ABNORMAL LOW (ref 40.0–52.0)
Hemoglobin: 10.6 g/dL — ABNORMAL LOW (ref 13.0–18.0)
MCH: 28.8 pg (ref 26.0–34.0)
MCHC: 34.3 g/dL (ref 32.0–36.0)
MCV: 84 fL (ref 80.0–100.0)
Platelets: 143 10*3/uL — ABNORMAL LOW (ref 150–440)
RBC: 3.69 MIL/uL — ABNORMAL LOW (ref 4.40–5.90)
RDW: 14.6 % — ABNORMAL HIGH (ref 11.5–14.5)
WBC: 6.6 10*3/uL (ref 3.8–10.6)

## 2016-10-23 LAB — BASIC METABOLIC PANEL
Anion gap: 7 (ref 5–15)
BUN: 36 mg/dL — AB (ref 6–20)
CALCIUM: 8.4 mg/dL — AB (ref 8.9–10.3)
CO2: 25 mmol/L (ref 22–32)
CREATININE: 1.22 mg/dL (ref 0.61–1.24)
Chloride: 103 mmol/L (ref 101–111)
GFR calc Af Amer: >60 mL/min (ref >60)
GFR calc non Af Amer: >60 mL/min (ref >60)
GLUCOSE: 168 mg/dL — AB (ref 65–99)
POTASSIUM: 3.6 mmol/L (ref 3.5–5.1)
SODIUM: 135 mmol/L (ref 135–145)

## 2016-10-23 LAB — GLUCOSE, CAPILLARY
Glucose-Capillary: 147 mg/dL — ABNORMAL HIGH (ref 65–99)
Glucose-Capillary: 159 mg/dL — ABNORMAL HIGH (ref 65–99)
Glucose-Capillary: 166 mg/dL — ABNORMAL HIGH (ref 65–99)
Glucose-Capillary: 165 mg/dL — ABNORMAL HIGH (ref 65–99)

## 2016-10-23 MED ORDER — SODIUM CHLORIDE 0.9 % IV SOLN
1250.0000 mg | Freq: Three times a day (TID) | INTRAVENOUS | Status: DC
  Administered 2016-10-23 – 2016-10-24 (×4): 1250 mg via INTRAVENOUS
  Filled 2016-10-23 (×6): qty 1250

## 2016-10-23 MED ORDER — CETIRIZINE HCL 10 MG PO TABS
10.0000 mg | ORAL_TABLET | Freq: Every day | ORAL | Status: DC
  Filled 2016-10-23 (×4): qty 1

## 2016-10-23 MED ORDER — VITAMIN B-12 1000 MCG PO TABS
1000.0000 ug | ORAL_TABLET | Freq: Every day | ORAL | Status: DC
  Administered 2016-10-23 – 2016-10-26 (×4): 1000 ug via ORAL
  Filled 2016-10-23 (×4): qty 1

## 2016-10-23 MED ORDER — ENOXAPARIN SODIUM 40 MG/0.4ML ~~LOC~~ SOLN
40.0000 mg | Freq: Two times a day (BID) | SUBCUTANEOUS | Status: DC
  Administered 2016-10-23 – 2016-10-26 (×6): 40 mg via SUBCUTANEOUS
  Filled 2016-10-23 (×6): qty 0.4

## 2016-10-23 NOTE — Progress Notes (Addendum)
Pharmacy Antibiotic Note  Patrick Duncan is a 51 y.o. male admitted on 10/22/2016 with cellulitis.  Pharmacy has been consulted for vancomycin and meropenem dosing.   Plan: 1.Renal function has improved. Dosing weight is 100kg  Updated: Ke: 0.092   T1/2: 7.5   VD: 70  Will adjust vancomycin dose to Vancomycin 1.25 gm IV Q8H, predicted trough 14.6 mcg/mL. Pharmacy will continue to follow and adjust as needed to maintain trough 10 to 15 mcg/mL (no abscess noted at this time).   2. Continue meropenem dose of 1g every 8 hours.    Height: 5\' 7"  (170.2 cm) Weight: (!) 330 lb (149.7 kg) IBW/kg (Calculated) : 66.1  Temp (24hrs), Avg:98.3 F (36.8 C), Min:98 F (36.7 C), Max:99 F (37.2 C)   Recent Labs Lab 10/22/16 1620 10/23/16 0429  WBC 8.9 6.6  CREATININE 2.29* 1.22    Estimated Creatinine Clearance: 104.2 mL/min (by C-G formula based on SCr of 1.22 mg/dL).    Allergies  Allergen Reactions  . Ampicillin Diarrhea  . Claritin [Loratadine] Other (See Comments)    Irritates throat    Thank you for allowing pharmacy to be a part of this patient's care.  Patrick Duncan M Patrick Duncan, Pharm.D., BCPS Clinical Pharmacist 10/23/2016 8:24 AM

## 2016-10-23 NOTE — Progress Notes (Signed)
Pnt oriented to unit and room. Pnt denies any pain or discomfort  CPAP set up at bedside per RT.  No issues or concerns at this time. Pnt resting and appears comfortable. Bed low and locked will continue to monitor and assess.

## 2016-10-23 NOTE — Progress Notes (Signed)
[facility] NAME: Patrick Duncan    MR#:  [id number]  DATE OF BIRTH:  [birth year]  SUBJECTIVE:   Patient is here due to left lower extremity redness swelling secondary to cellulitis. Patient's mother is at bedside. Pain in the left lower extremity improved since yesterday. No fever.  REVIEW OF SYSTEMS:    Review of Systems  Constitutional: Negative for chills and fever.  HENT: Negative for congestion and tinnitus.   Eyes: Negative for blurred vision and double vision.  Respiratory: Negative for cough, shortness of breath and wheezing.   Cardiovascular: Negative for chest pain, orthopnea and PND.  Gastrointestinal: Negative for abdominal pain, diarrhea, nausea and vomiting.  Genitourinary: Negative for dysuria and hematuria.  Skin: Positive for rash (LLE redness, swelling.  ).  Neurological: Negative for dizziness, sensory change and focal weakness.  All other systems reviewed and are negative.   Nutrition: Heart Healthy/Carb control Tolerating Diet: Yes Tolerating PT: Await Eval.    DRUG ALLERGIES:   Allergies  Allergen Reactions  . Ampicillin Diarrhea  . Claritin [Loratadine] Other (See Comments)    Irritates throat    VITALS:  Blood pressure (!) 110/35, pulse 72, temperature 98 F (36.7 C), resp. rate (!) 22, height 5\' 7"  (1.702 m), weight (!) 149.7 kg (330 lb), SpO2 95 %.  PHYSICAL EXAMINATION:   Physical Exam  GENERAL:  51 y.o.-year-old obese patient lying in bed in no acute distress.  EYES: Pupils equal, round, reactive to light and accommodation. No scleral icterus. Extraocular muscles intact.  HEENT: Head atraumatic, normocephalic. Oropharynx and nasopharynx clear.  NECK:  Supple, no jugular venous distention. No thyroid enlargement, no tenderness.  LUNGS: Normal breath sounds bilaterally, no wheezing, rales, rhonchi. No use of accessory muscles of respiration.  CARDIOVASCULAR: S1, S2 normal. No murmurs,  rubs, or gallops.  ABDOMEN: Soft, protuberant, nontender, nondistended. Bowel sounds present. No organomegaly or mass.  EXTREMITIES: No cyanosis, clubbing or edema b/l.   Left lower extremity redness swelling and warmth secondary to cellulitis. NEUROLOGIC: Cranial nerves II through XII are intact. No focal Motor or sensory deficits b/l. Globally weak  PSYCHIATRIC: The patient is alert and oriented x 3.  SKIN: No obvious rash, lesion, or ulcer.    LABORATORY PANEL:   CBC  Recent Labs Lab 10/23/16 0429  WBC 6.6  HGB 10.6*  HCT 31.0*  PLT 143*   ------------------------------------------------------------------------------------------------------------------  Chemistries   Recent Labs Lab 10/22/16 1620 10/23/16 0429  NA 135 135  K 3.5 3.6  CL 101 103  CO2 26 25  GLUCOSE 139* 168*  BUN 49* 36*  CREATININE 2.29* 1.22  CALCIUM 9.2 8.4*  AST 33  --   ALT 28  --   ALKPHOS 38  --   BILITOT 0.7  --    ------------------------------------------------------------------------------------------------------------------  Cardiac Enzymes No results for input(s): TROPONINI in the last 168 hours. ------------------------------------------------------------------------------------------------------------------  RADIOLOGY:  Ct Abdomen Pelvis Wo Contrast  Result Date: 10/22/2016 CLINICAL DATA:  51 y/o M; back pain and right leg pain since yesterday. Low-grade fever. EXAM: CT ABDOMEN AND PELVIS WITHOUT CONTRAST TECHNIQUE: Multidetector CT imaging of the abdomen and pelvis was performed following the standard protocol without IV contrast. COMPARISON:  10/06/2010 CT abdomen and pelvis. Left lower extremity venous ultrasound dated 10/22/2016. FINDINGS: Lower chest: No acute abnormality. Hepatobiliary: No focal liver abnormality is seen. Hepatic steatosis. No gallstones, gallbladder wall thickening, or biliary dilatation. Pancreas: Unremarkable. No pancreatic ductal dilatation or surrounding  inflammatory  changes. Spleen: Normal in size without focal abnormality. Adrenals/Urinary Tract: Adrenal glands are unremarkable. Kidneys are normal, without renal calculi, focal lesion, or hydronephrosis. Bladder is unremarkable. Stomach/Bowel: Stomach is within normal limits. Appendix appears normal. No evidence of bowel wall thickening, distention, or inflammatory changes. Vascular/Lymphatic: Left-greater-than-right iliofemoral mild lymphadenopathy and surrounding retroperitoneal edema overlying the iliacus muscles bilaterally. On the left edema extends into the left groin along the iliofemoral vessels. Reproductive: Prostate is unremarkable. Other: Small paraumbilical hernia containing fat. No abdominopelvic ascites. Musculoskeletal: Mild osteoarthrosis of the hip joints. No acute fracture. IMPRESSION: 1. Mild left-greater-than-right iliofemoral lymphadenopathy and surrounding edema of uncertain significance. Findings probably represent lymphadenitis, possibly related to an infectious or inflammatory process of the groin or lower extremities. Edema follows the left iliofemoral vessels which may be tracking along lymphatics or be related to underlying vasculitis/ vessel thrombosis. 2. Hepatic steatosis. Electronically Signed   By: Kristine Garbe M.D.   On: 10/22/2016 19:45   Dg Chest 2 View  Result Date: 10/22/2016 CLINICAL DATA:  Dyspnea and cough EXAM: CHEST  2 VIEW COMPARISON:  07/16/2005 chest radiograph. FINDINGS: Mild enlargement of the cardiac silhouette. Otherwise normal mediastinal contour. No pneumothorax. No pleural effusion. Mild pulmonary edema. No acute consolidative airspace disease. IMPRESSION: Mild congestive heart failure. Electronically Signed   By: Ilona Sorrel M.D.   On: 10/22/2016 16:30   US Venous Img Lower Unilateral Left  Result Date: 10/22/2016 CLINICAL DATA:  Left lower extremity swelling and pain since last night EXAM: LEFT LOWER EXTREMITY VENOUS DOPPLER ULTRASOUND  TECHNIQUE: Gray-scale sonography with graded compression, as well as color Doppler and duplex ultrasound were performed to evaluate the lower extremity deep venous systems from the level of the common femoral vein and including the common femoral, femoral, profunda femoral, popliteal and calf veins including the posterior tibial, peroneal and gastrocnemius veins when visible. The superficial great saphenous vein was also interrogated. Spectral Doppler was utilized to evaluate flow at rest and with distal augmentation maneuvers in the common femoral, femoral and popliteal veins. COMPARISON:  None. FINDINGS: Contralateral Common Femoral Vein: Respiratory phasicity is normal and symmetric with the symptomatic side. No evidence of thrombus. Normal compressibility. Common Femoral Vein: No evidence of thrombus. Normal compressibility, respiratory phasicity and response to augmentation. Saphenofemoral Junction: No evidence of thrombus. Normal compressibility and flow on color Doppler imaging. Profunda Femoral Vein: No evidence of thrombus. Normal compressibility and flow on color Doppler imaging. Femoral Vein: No evidence of thrombus. Normal compressibility, respiratory phasicity and response to augmentation. Popliteal Vein: No evidence of thrombus. Normal compressibility, respiratory phasicity and response to augmentation. Calf Veins: No evidence of thrombus. Normal compressibility and flow on color Doppler imaging. Superficial Great Saphenous Vein: No evidence of thrombus. Normal compressibility and flow on color Doppler imaging. Venous Reflux:  None. Other Findings: No significant abnormalities. A measured 1.0 cm short axis left inguinal lymph node demonstrates no overtly suspicious sonographic features. IMPRESSION: No evidence of DVT within the left lower extremity. Electronically Signed   By: Ilona Sorrel M.D.   On: 10/22/2016 17:22     ASSESSMENT AND PLAN:   51 year old male with past medical history of  obesity, diabetes, obstructive sleep apnea, depression, essential hypertension who presented to the hospital due to due to left lower extremity redness swelling and pain.  1. Left lower extremity cellulitis-this is the cause of patient's symptoms as mentioned above. -We'll continue IV vancomycin, follow cultures. Dopplers are negative for DVT. Patient currently afebrile and hemodynamically stable.  2. Diabetes type  2 without complication-continue sliding scale insulin.  3. Essential hypertension-continue metoprolol, Norvasc, ramipril.  4. BPH-continue Terazosin.    All the records are reviewed and case discussed with Care Management/Social Worker. Management plans discussed with the patient, family and they are in agreement.  CODE STATUS: Full code  DVT Prophylaxis: Lovenox  TOTAL TIME TAKING CARE OF THIS PATIENT: 30 minutes.   POSSIBLE D/C IN 1-2 DAYS, DEPENDING ON CLINICAL CONDITION.   Henreitta Leber M.D on 10/23/2016 at 1:05 PM  Between 7am to 6pm - Pager - [phone]  After 6pm go to [email or web address] - Proofreader  [facility]  Office  [phone]  CC: Primary care physician; Sofie Hartigan, MD

## 2016-10-23 NOTE — Care Management (Signed)
Admitted with cellulitis lower extremities.  Patient lives with his wife and up until this episode of illness, he was independent in all adls and employed.  Current with pcp.  Denies issues accessing medical care, obtaining medications or with transportation.  Receiving IV antibiotics.  Cultures neg thus far

## 2016-10-24 LAB — CBC
HEMATOCRIT: 31.7 % — AB (ref 40.0–52.0)
Hemoglobin: 11 g/dL — ABNORMAL LOW (ref 13.0–18.0)
MCH: 29.2 pg (ref 26.0–34.0)
MCHC: 34.7 g/dL (ref 32.0–36.0)
MCV: 84.1 fL (ref 80.0–100.0)
Platelets: 158 10*3/uL (ref 150–440)
RBC: 3.77 MIL/uL — ABNORMAL LOW (ref 4.40–5.90)
RDW: 14.7 % — AB (ref 11.5–14.5)
WBC: 4 10*3/uL (ref 3.8–10.6)

## 2016-10-24 LAB — HIV ANTIBODY (ROUTINE TESTING W REFLEX): HIV SCREEN 4TH GENERATION: NONREACTIVE

## 2016-10-24 LAB — GLUCOSE, CAPILLARY
GLUCOSE-CAPILLARY: 156 mg/dL — AB (ref 65–99)
GLUCOSE-CAPILLARY: 139 mg/dL — AB (ref 65–99)
GLUCOSE-CAPILLARY: 153 mg/dL — AB (ref 65–99)
Glucose-Capillary: 170 mg/dL — ABNORMAL HIGH (ref 65–99)

## 2016-10-24 LAB — VANCOMYCIN, TROUGH: Vancomycin Tr: 15 ug/mL (ref 15–20)

## 2016-10-24 MED ORDER — VANCOMYCIN HCL IN DEXTROSE 1-5 GM/200ML-% IV SOLN
1000.0000 mg | Freq: Three times a day (TID) | INTRAVENOUS | Status: DC
  Administered 2016-10-24 – 2016-10-25 (×3): 1000 mg via INTRAVENOUS
  Filled 2016-10-24 (×5): qty 200

## 2016-10-24 NOTE — Progress Notes (Signed)
[place] at [place] NAME: Patrick Duncan    MR#:  [id number]  DATE OF BIRTH:  10/15/1965  SUBJECTIVE:   Still has significant LLE redness.  Still having some pain.  No fever or any other associated symptoms.   REVIEW OF SYSTEMS:    Review of Systems  Constitutional: Negative for chills and fever.  HENT: Negative for congestion and tinnitus.   Eyes: Negative for blurred vision and double vision.  Respiratory: Negative for cough, shortness of breath and wheezing.   Cardiovascular: Negative for chest pain, orthopnea and PND.  Gastrointestinal: Negative for abdominal pain, diarrhea, nausea and vomiting.  Genitourinary: Negative for dysuria and hematuria.  Skin: Positive for rash (LLE redness, swelling.  ).  Neurological: Negative for dizziness, sensory change and focal weakness.  All other systems reviewed and are negative.   Nutrition: Heart Healthy/Carb control Tolerating Diet: Yes Tolerating PT: Await Eval.    DRUG ALLERGIES:   Allergies  Allergen Reactions  . Ampicillin Diarrhea  . Claritin [Loratadine] Other (See Comments)    Irritates throat    VITALS:  Blood pressure (!) 152/62, pulse 71, temperature 97.9 F (36.6 C), temperature source Oral, resp. rate 20, height 5\' 7"  (1.702 m), weight (!) 149.7 kg (330 lb), SpO2 97 %.  PHYSICAL EXAMINATION:   Physical Exam  GENERAL:  51 y.o.-year-old obese patient lying in bed in no acute distress.  EYES: Pupils equal, round, reactive to light and accommodation. No scleral icterus. Extraocular muscles intact.  HEENT: Head atraumatic, normocephalic. Oropharynx and nasopharynx clear.  NECK:  Supple, no jugular venous distention. No thyroid enlargement, no tenderness.  LUNGS: Normal breath sounds bilaterally, no wheezing, rales, rhonchi. No use of accessory muscles of respiration.  CARDIOVASCULAR: S1, S2 normal. No murmurs, rubs, or gallops.  ABDOMEN: Soft, protuberant, nontender,  nondistended. Bowel sounds present. No organomegaly or mass.  EXTREMITIES: No cyanosis, clubbing or edema b/l.   Left lower extremity redness swelling and warmth secondary to cellulitis. NEUROLOGIC: Cranial nerves II through XII are intact. No focal Motor or sensory deficits b/l. Globally weak  PSYCHIATRIC: The patient is alert and oriented x 3.  SKIN: No obvious rash, lesion, or ulcer.    LABORATORY PANEL:   CBC  Recent Labs Lab 10/24/16 0317  WBC 4.0  HGB 11.0*  HCT 31.7*  PLT 158   ------------------------------------------------------------------------------------------------------------------  Chemistries   Recent Labs Lab 10/22/16 1620 10/23/16 0429  NA 135 135  K 3.5 3.6  CL 101 103  CO2 26 25  GLUCOSE 139* 168*  BUN 49* 36*  CREATININE 2.29* 1.22  CALCIUM 9.2 8.4*  AST 33  --   ALT 28  --   ALKPHOS 38  --   BILITOT 0.7  --    ------------------------------------------------------------------------------------------------------------------  Cardiac Enzymes No results for input(s): TROPONINI in the last 168 hours. ------------------------------------------------------------------------------------------------------------------  RADIOLOGY:  Ct Abdomen Pelvis Wo Contrast  Result Date: 10/22/2016 CLINICAL DATA:  51 y/o M; back pain and right leg pain since yesterday. Low-grade fever. EXAM: CT ABDOMEN AND PELVIS WITHOUT CONTRAST TECHNIQUE: Multidetector CT imaging of the abdomen and pelvis was performed following the standard protocol without IV contrast. COMPARISON:  10/06/2010 CT abdomen and pelvis. Left lower extremity venous ultrasound dated 10/22/2016. FINDINGS: Lower chest: No acute abnormality. Hepatobiliary: No focal liver abnormality is seen. Hepatic steatosis. No gallstones, gallbladder wall thickening, or biliary dilatation. Pancreas: Unremarkable. No pancreatic ductal dilatation or surrounding inflammatory changes. Spleen: Normal in size without focal  abnormality.  Adrenals/Urinary Tract: Adrenal glands are unremarkable. Kidneys are normal, without renal calculi, focal lesion, or hydronephrosis. Bladder is unremarkable. Stomach/Bowel: Stomach is within normal limits. Appendix appears normal. No evidence of bowel wall thickening, distention, or inflammatory changes. Vascular/Lymphatic: Left-greater-than-right iliofemoral mild lymphadenopathy and surrounding retroperitoneal edema overlying the iliacus muscles bilaterally. On the left edema extends into the left groin along the iliofemoral vessels. Reproductive: Prostate is unremarkable. Other: Small paraumbilical hernia containing fat. No abdominopelvic ascites. Musculoskeletal: Mild osteoarthrosis of the hip joints. No acute fracture. IMPRESSION: 1. Mild left-greater-than-right iliofemoral lymphadenopathy and surrounding edema of uncertain significance. Findings probably represent lymphadenitis, possibly related to an infectious or inflammatory process of the groin or lower extremities. Edema follows the left iliofemoral vessels which may be tracking along lymphatics or be related to underlying vasculitis/ vessel thrombosis. 2. Hepatic steatosis. Electronically Signed   By: Kristine Garbe M.D.   On: 10/22/2016 19:45   Dg Chest 2 View  Result Date: 10/22/2016 CLINICAL DATA:  Dyspnea and cough EXAM: CHEST  2 VIEW COMPARISON:  07/16/2005 chest radiograph. FINDINGS: Mild enlargement of the cardiac silhouette. Otherwise normal mediastinal contour. No pneumothorax. No pleural effusion. Mild pulmonary edema. No acute consolidative airspace disease. IMPRESSION: Mild congestive heart failure. Electronically Signed   By: Ilona Sorrel M.D.   On: 10/22/2016 16:30   US Venous Img Lower Unilateral Left  Result Date: 10/22/2016 CLINICAL DATA:  Left lower extremity swelling and pain since last night EXAM: LEFT LOWER EXTREMITY VENOUS DOPPLER ULTRASOUND TECHNIQUE: Gray-scale sonography with graded compression, as  well as color Doppler and duplex ultrasound were performed to evaluate the lower extremity deep venous systems from the level of the common femoral vein and including the common femoral, femoral, profunda femoral, popliteal and calf veins including the posterior tibial, peroneal and gastrocnemius veins when visible. The superficial great saphenous vein was also interrogated. Spectral Doppler was utilized to evaluate flow at rest and with distal augmentation maneuvers in the common femoral, femoral and popliteal veins. COMPARISON:  None. FINDINGS: Contralateral Common Femoral Vein: Respiratory phasicity is normal and symmetric with the symptomatic side. No evidence of thrombus. Normal compressibility. Common Femoral Vein: No evidence of thrombus. Normal compressibility, respiratory phasicity and response to augmentation. Saphenofemoral Junction: No evidence of thrombus. Normal compressibility and flow on color Doppler imaging. Profunda Femoral Vein: No evidence of thrombus. Normal compressibility and flow on color Doppler imaging. Femoral Vein: No evidence of thrombus. Normal compressibility, respiratory phasicity and response to augmentation. Popliteal Vein: No evidence of thrombus. Normal compressibility, respiratory phasicity and response to augmentation. Calf Veins: No evidence of thrombus. Normal compressibility and flow on color Doppler imaging. Superficial Great Saphenous Vein: No evidence of thrombus. Normal compressibility and flow on color Doppler imaging. Venous Reflux:  None. Other Findings: No significant abnormalities. A measured 1.0 cm short axis left inguinal lymph node demonstrates no overtly suspicious sonographic features. IMPRESSION: No evidence of DVT within the left lower extremity. Electronically Signed   By: Ilona Sorrel M.D.   On: 10/22/2016 17:22     ASSESSMENT AND PLAN:   51 year old male with past medical history of obesity, diabetes, obstructive sleep apnea, depression, essential  hypertension who presented to the hospital due to due to left lower extremity redness swelling and pain.  1. Left lower extremity cellulitis- this is the cause of patient's symptoms as mentioned above. - We'll continue IV vancomycin.  Still has some redness, swelling presently. Doppler (-) for DVT.   2. Diabetes type 2 without complication-continue sliding scale insulin.  3. Essential hypertension-continue metoprolol, Norvasc, ramipril.  4. BPH-continue Terazosin.   Hope d/c tomorrow on Oral abx if clinically improved.   All the records are reviewed and case discussed with Care Management/Social Worker. Management plans discussed with the patient, family and they are in agreement.  CODE STATUS: Full code  DVT Prophylaxis: Lovenox  TOTAL TIME TAKING CARE OF THIS PATIENT: 25 minutes.   POSSIBLE D/C IN 1-2 DAYS, DEPENDING ON CLINICAL CONDITION.   Henreitta Leber M.D on 10/24/2016 at 1:31 PM  Between 7am to 6pm - Pager - [phone]  After 6pm go to [email or web address] - Proofreader  [facility]  Office  [phone]  CC: Primary care physician; Sofie Hartigan, MD

## 2016-10-24 NOTE — Progress Notes (Addendum)
Pharmacy Antibiotic Note  Patrick Duncan is a 51 y.o. male admitted on 10/22/2016 with cellulitis.  Pharmacy has been consulted for vancomycin dosing.   Plan: 1. Dosing weight is 100kg  Updated: Ke: 0.092   T1/2: 7.5   VD: 70  10/9 1017 Vanc trough 15. Level was drawn 45 min late. Patient is high risk of accumulation. Will adjust dose to Vancomycin 1 gm IV Q8H, and redraw vanc trough after 4 additional doses. Patient is at high risk of accumulation.   Pharmacy will continue to follow and adjust as needed to maintain trough 10 to 15 mcg/mL (no abscess noted at this time).   Height: 5\' 7"  (170.2 cm) Weight: (!) 330 lb (149.7 kg) IBW/kg (Calculated) : 66.1  Temp (24hrs), Avg:98.1 F (36.7 C), Min:97.5 F (36.4 C), Max:98.7 F (37.1 C)   Recent Labs Lab 10/22/16 1620 10/23/16 0429 10/24/16 0317 10/24/16 1017  WBC 8.9 6.6 4.0  --   CREATININE 2.29* 1.22  --   --   VANCOTROUGH  --   --   --  15    Estimated Creatinine Clearance: 104.2 mL/min (by C-G formula based on SCr of 1.22 mg/dL).    Allergies  Allergen Reactions  . Ampicillin Diarrhea  . Claritin [Loratadine] Other (See Comments)    Irritates throat    Thank you for allowing pharmacy to be a part of this patient's care.  Ramsey Midgett M [name], Pharm.D., BCPS Clinical Pharmacist 10/24/2016 10:49 AM

## 2016-10-25 LAB — CREATININE, SERUM: CREATININE: 0.8 mg/dL (ref 0.61–1.24)

## 2016-10-25 LAB — GLUCOSE, CAPILLARY
GLUCOSE-CAPILLARY: 181 mg/dL — AB (ref 65–99)
GLUCOSE-CAPILLARY: 157 mg/dL — AB (ref 65–99)
Glucose-Capillary: 176 mg/dL — ABNORMAL HIGH (ref 65–99)
Glucose-Capillary: 193 mg/dL — ABNORMAL HIGH (ref 65–99)

## 2016-10-25 MED ORDER — DEXTROSE 5 % IV SOLN
Freq: Three times a day (TID) | INTRAVENOUS | Status: DC
  Administered 2016-10-25 – 2016-10-26 (×3): via INTRAVENOUS
  Filled 2016-10-25 (×6): qty 20

## 2016-10-25 MED ORDER — METFORMIN HCL ER 500 MG PO TB24
500.0000 mg | ORAL_TABLET | Freq: Two times a day (BID) | ORAL | Status: DC
  Administered 2016-10-25 – 2016-10-26 (×2): 500 mg via ORAL
  Filled 2016-10-25 (×3): qty 1

## 2016-10-25 MED ORDER — GLIMEPIRIDE 2 MG PO TABS
1.0000 mg | ORAL_TABLET | Freq: Every day | ORAL | Status: DC
  Administered 2016-10-26: 09:00:00 1 mg via ORAL
  Filled 2016-10-25: qty 1

## 2016-10-25 MED ORDER — METFORMIN HCL 500 MG PO TABS
500.0000 mg | ORAL_TABLET | Freq: Two times a day (BID) | ORAL | Status: DC
  Filled 2016-10-25: qty 1

## 2016-10-25 MED ORDER — CEFAZOLIN SODIUM-DEXTROSE 2-4 GM/100ML-% IV SOLN
2.0000 g | Freq: Three times a day (TID) | INTRAVENOUS | Status: DC
  Filled 2016-10-25 (×3): qty 100

## 2016-10-25 NOTE — Progress Notes (Signed)
[place] at [place] NAME: Patrick Duncan    MR#:  [id number]  DATE OF BIRTH:  09/19/1965  SUBJECTIVE:   Redness has somewhat improved since yesterday.  No fever.  Overall feels better.  REVIEW OF SYSTEMS:    Review of Systems  Constitutional: Negative for chills and fever.  HENT: Negative for congestion and tinnitus.   Eyes: Negative for blurred vision and double vision.  Respiratory: Negative for cough, shortness of breath and wheezing.   Cardiovascular: Negative for chest pain, orthopnea and PND.  Gastrointestinal: Negative for abdominal pain, diarrhea, nausea and vomiting.  Genitourinary: Negative for dysuria and hematuria.  Skin: Positive for rash (LLE redness, swelling.  ).  Neurological: Negative for dizziness, sensory change and focal weakness.  All other systems reviewed and are negative.   Nutrition: Heart Healthy/Carb control Tolerating Diet: Yes Tolerating PT: Await Eval.    DRUG ALLERGIES:   Allergies  Allergen Reactions  . Ampicillin Diarrhea  . Claritin [Loratadine] Other (See Comments)    Irritates throat    VITALS:  Blood pressure 127/64, pulse 72, temperature 97.9 F (36.6 C), temperature source Oral, resp. rate 20, height 5\' 7"  (1.702 m), weight (!) 149.7 kg (330 lb), SpO2 95 %.  PHYSICAL EXAMINATION:   Physical Exam  GENERAL:  51 y.o.-year-old obese patient lying in bed in no acute distress.  EYES: Pupils equal, round, reactive to light and accommodation. No scleral icterus. Extraocular muscles intact.  HEENT: Head atraumatic, normocephalic. Oropharynx and nasopharynx clear.  NECK:  Supple, no jugular venous distention. No thyroid enlargement, no tenderness.  LUNGS: Normal breath sounds bilaterally, no wheezing, rales, rhonchi. No use of accessory muscles of respiration.  CARDIOVASCULAR: S1, S2 normal. No murmurs, rubs, or gallops.  ABDOMEN: Soft, protuberant, nontender, nondistended. Bowel sounds  present. No organomegaly or mass.  EXTREMITIES: No cyanosis, clubbing or edema b/l.   Left lower extremity redness swelling and warmth secondary to cellulitis. NEUROLOGIC: Cranial nerves II through XII are intact. No focal Motor or sensory deficits b/l. Globally weak  PSYCHIATRIC: The patient is alert and oriented x 3.  SKIN: No obvious rash, lesion, or ulcer.    LABORATORY PANEL:   CBC  Recent Labs Lab 10/24/16 0317  WBC 4.0  HGB 11.0*  HCT 31.7*  PLT 158   ------------------------------------------------------------------------------------------------------------------  Chemistries   Recent Labs Lab 10/22/16 1620 10/23/16 0429 10/25/16 0529  NA 135 135  --   K 3.5 3.6  --   CL 101 103  --   CO2 26 25  --   GLUCOSE 139* 168*  --   BUN 49* 36*  --   CREATININE 2.29* 1.22 0.80  CALCIUM 9.2 8.4*  --   AST 33  --   --   ALT 28  --   --   ALKPHOS 38  --   --   BILITOT 0.7  --   --    ------------------------------------------------------------------------------------------------------------------  Cardiac Enzymes No results for input(s): TROPONINI in the last 168 hours. ------------------------------------------------------------------------------------------------------------------  RADIOLOGY:  No results found.   ASSESSMENT AND PLAN:   51 year old male with past medical history of obesity, diabetes, obstructive sleep apnea, depression, essential hypertension who presented to the hospital due to due to left lower extremity redness swelling and pain.  1. Left lower extremity cellulitis- this is the cause of patient's symptoms as mentioned above. -Redness and warmth has improved since admission. I will change IV vancomycin to Ancef. Anticipate possible discharge tomorrow on  oral antibiotics. - Doppler (-) for DVT.   2. Diabetes type 2 without complication-continue sliding scale insulin. - Blood sugar stable so far.  3. Essential hypertension-continue  metoprolol, Norvasc, ramipril.  4. BPH-continue Terazosin.   All the records are reviewed and case discussed with Care Management/Social Worker. Management plans discussed with the patient, family and they are in agreement.  CODE STATUS: Full code  DVT Prophylaxis: Lovenox  TOTAL TIME TAKING CARE OF THIS PATIENT: 25 minutes.   POSSIBLE D/C IN 1-2 DAYS, DEPENDING ON CLINICAL CONDITION.   Henreitta Leber M.D on 10/25/2016 at 1:13 PM  Between 7am to 6pm - Pager - [phone]  After 6pm go to [email or web address] - Proofreader  [facility]  Office  [phone]  CC: Primary care physician; Sofie Hartigan, MD

## 2016-10-26 LAB — GLUCOSE, CAPILLARY
GLUCOSE-CAPILLARY: 187 mg/dL — AB (ref 65–99)
GLUCOSE-CAPILLARY: 178 mg/dL — AB (ref 65–99)

## 2016-10-26 MED ORDER — DOXYCYCLINE HYCLATE 100 MG PO CAPS: 100.0000 mg | ORAL_CAPSULE | Freq: Two times a day (BID) | ORAL | 0 refills | Status: AC

## 2016-10-26 NOTE — Discharge Summary (Signed)
[place] at [place] NAME: Patrick Duncan    MR#:  [id number]  [place] OF BIRTH:  1965/02/13  DATE OF ADMISSION:  10/22/2016 ADMITTING PHYSICIAN: Lance Coon, MD  DATE OF DISCHARGE: 10/26/2016  PRIMARY CARE PHYSICIAN: Sofie Hartigan, MD    ADMISSION DIAGNOSIS:  Left leg cellulitis [Q94.765] Acute renal failure, unspecified acute renal failure type ([place]) [N17.9]  DISCHARGE DIAGNOSIS:  Principal Problem:   Cellulitis Active Problems:   Diabetes ([place])   HTN (hypertension)   HLD (hyperlipidemia)   OSA (obstructive sleep apnea)   AKI (acute kidney injury) ([place])   SECONDARY DIAGNOSIS:   Past Medical History:  Diagnosis Date  . Depression   . Diabetes mellitus without complication ([place])   . HLD (hyperlipidemia)   . Hypertension   . Sleep apnea     HOSPITAL COURSE:   51 year old male with past medical history of obesity, diabetes, obstructive sleep apnea, depression, essential hypertension who presented to the hospital due to due to left lower extremity redness swelling and pain.  1. Left lower extremity cellulitis- this was the cause of patient's symptoms as mentioned above. he patient underwent Dopplers of his lower extremities which were negative for DVT. Patient was treated with IV vancomycin initially and then switched over to IV Ancef. His redness and swelling and warmth in his leg is improved. He is able to ambulate without much pain. She is being discharged on oral Keflex for additional 10 days.  2. Diabetes type 2 without complication- while in the hospital pt. Was on SSI and now being discharged On Oral Metformin, Glimeperide.  - Blood sugar stable so far.  3. Essential hypertension- pt. Will continue metoprolol, Norvasc, ramipril.  4. BPH- pt. Will continue Terazosin.   DISCHARGE CONDITIONS:   Stable.   CONSULTS OBTAINED:    DRUG ALLERGIES:   Allergies  Allergen Reactions  . Ampicillin Diarrhea  .  Claritin [Loratadine] Other (See Comments)    Irritates throat    DISCHARGE MEDICATIONS:   Allergies as of 10/26/2016      Reactions   Ampicillin Diarrhea   Claritin [loratadine] Other (See Comments)   Irritates throat      Medication List    TAKE these medications   amLODipine 10 MG tablet Commonly known as:  NORVASC Take 10 mg by mouth daily.   chlorthalidone 25 MG tablet Commonly known as:  HYGROTON Take 25 mg by mouth daily.   doxycycline 100 MG capsule Commonly known as:  VIBRAMYCIN Take 1 capsule (100 mg total) by mouth 2 (two) times daily.   fexofenadine 180 MG tablet Commonly known as:  ALLEGRA Take 180 mg by mouth daily.   fluticasone 50 MCG/ACT nasal spray Commonly known as:  FLONASE Place 2 sprays into both nostrils daily as needed.   glimepiride 1 MG tablet Commonly known as:  AMARYL Take 1 mg by mouth daily.   metFORMIN 500 MG 24 hr tablet Commonly known as:  GLUCOPHAGE-XR Take 500 mg by mouth 2 (two) times daily.   metoprolol tartrate 50 MG tablet Commonly known as:  LOPRESSOR Take 50 mg by mouth 2 (two) times daily.   pioglitazone 30 MG tablet Commonly known as:  ACTOS Take 30 mg by mouth daily.   pseudoephedrine-guaifenesin 60-600 MG 12 hr tablet Commonly known as:  MUCINEX D Take 1 tablet by mouth every 12 (twelve) hours.   ramipril 10 MG capsule Commonly known as:  ALTACE Take 10 mg by mouth 2 (two) times  daily.   terazosin 10 MG capsule Commonly known as:  HYTRIN Take 10 mg by mouth daily.   traZODone 150 MG tablet Commonly known as:  DESYREL Take 150 mg by mouth at bedtime.   triamcinolone ointment 0.1 % Commonly known as:  KENALOG Apply 1 application topically daily.   trolamine salicylate 10 % cream Commonly known as:  ASPERCREME Apply 1 application topically as needed for muscle pain.   vitamin B-12 1000 MCG tablet Commonly known as:  CYANOCOBALAMIN Take 1,000 mcg by mouth daily.         DISCHARGE  INSTRUCTIONS:   DIET:  Cardiac diet and Diabetic diet  DISCHARGE CONDITION:  Stable  ACTIVITY:  Activity as tolerated  OXYGEN:  Home Oxygen: No.   Oxygen Delivery: room air  DISCHARGE LOCATION:  home   If you experience worsening of your admission symptoms, develop shortness of breath, life threatening emergency, suicidal or homicidal thoughts you must seek medical attention immediately by calling 911 or calling your MD immediately  if symptoms less severe.  You Must read complete instructions/literature along with all the possible adverse reactions/side effects for all the Medicines you take and that have been prescribed to you. Take any new Medicines after you have completely understood and accpet all the possible adverse reactions/side effects.   Please note  You were cared for by a hospitalist during your hospital stay. If you have any questions about your discharge medications or the care you received while you were in the hospital after you are discharged, you can call the unit and asked to speak with the hospitalist on call if the hospitalist that took care of you is not available. Once you are discharged, your primary care physician will handle any further medical issues. Please note that NO REFILLS for any discharge medications will be authorized once you are discharged, as it is imperative that you return to your primary care physician (or establish a relationship with a primary care physician if you do not have one) for your aftercare needs so that they can reassess your need for medications and monitor your lab values.     Today   Redness, swelling, warmth much improved since admission.  No fever, N/V or any other associated symptoms. Will d/c home today.   VITAL SIGNS:  Blood pressure (!) 134/59, pulse 76, temperature 97.6 F (36.4 C), temperature source Oral, resp. rate 18, height 5\' 7"  (1.702 m), weight (!) 149.7 kg (330 lb), SpO2 95 %.  I/O:   Intake/Output  Summary (Last 24 hours) at 10/26/16 1323 Last data filed at 10/25/16 1848  Gross per 24 hour  Intake              580 ml  Output                0 ml  Net              580 ml    PHYSICAL EXAMINATION:   GENERAL:  51 y.o.-year-old obese patient lying in bed in no acute distress.  EYES: Pupils equal, round, reactive to light and accommodation. No scleral icterus. Extraocular muscles intact.  HEENT: Head atraumatic, normocephalic. Oropharynx and nasopharynx clear.  NECK:  Supple, no jugular venous distention. No thyroid enlargement, no tenderness.  LUNGS: Normal breath sounds bilaterally, no wheezing, rales, rhonchi. No use of accessory muscles of respiration.  CARDIOVASCULAR: S1, S2 normal. No murmurs, rubs, or gallops.  ABDOMEN: Soft, protuberant, nontender, nondistended. Bowel sounds present. No organomegaly or  mass.  EXTREMITIES: No cyanosis, clubbing or edema b/l.   Left lower extremity redness swelling and warmth improved. NEUROLOGIC: Cranial nerves II through XII are intact. No focal Motor or sensory deficits b/l. Globally weak  PSYCHIATRIC: The patient is alert and oriented x 3.  SKIN: No obvious rash, lesion, or ulcer.   DATA REVIEW:   CBC  Recent Labs Lab 10/24/16 0317  WBC 4.0  HGB 11.0*  HCT 31.7*  PLT 158    Chemistries   Recent Labs Lab 10/22/16 1620 10/23/16 0429 10/25/16 0529  NA 135 135  --   K 3.5 3.6  --   CL 101 103  --   CO2 26 25  --   GLUCOSE 139* 168*  --   BUN 49* 36*  --   CREATININE 2.29* 1.22 0.80  CALCIUM 9.2 8.4*  --   AST 33  --   --   ALT 28  --   --   ALKPHOS 38  --   --   BILITOT 0.7  --   --     Cardiac Enzymes No results for input(s): TROPONINI in the last 168 hours.  Microbiology Results  Results for orders placed or performed during the hospital encounter of 10/22/16  Culture, blood (routine x 2)     Status: None (Preliminary result)   Collection Time: 10/22/16  4:20 PM  Result Value Ref Range Status   Specimen  Description BLOOD RIGHT ANTECUBITAL  Final   Special Requests   Final    BOTTLES DRAWN AEROBIC AND ANAEROBIC Blood Culture adequate volume   Culture NO GROWTH 4 DAYS  Final   Report Status PENDING  Incomplete  Culture, blood (routine x 2)     Status: None (Preliminary result)   Collection Time: 10/22/16  4:26 PM  Result Value Ref Range Status   Specimen Description BLOOD LEFT ANTECUBITAL  Final   Special Requests   Final    BOTTLES DRAWN AEROBIC AND ANAEROBIC Blood Culture adequate volume   Culture NO GROWTH 4 DAYS  Final   Report Status PENDING  Incomplete    RADIOLOGY:  No results found.    Management plans discussed with the patient, family and they are in agreement.  CODE STATUS:     Code Status Orders        Start     Ordered   10/22/16 2342  Full code  Continuous     10/22/16 2341    Code Status History    Date Active Date Inactive Code Status Order ID Comments User Context   This patient has a current code status but no historical code status.      TOTAL TIME TAKING CARE OF THIS PATIENT: 40 minutes.    Henreitta Leber M.D on 10/26/2016 at 1:23 PM  Between 7am to 6pm - Pager - [phone]  After 6pm go to [email or web address] - Proofreader  [facility]  Office  [phone]  CC: Primary care physician; Sofie Hartigan, MD

## 2016-10-26 NOTE — Progress Notes (Signed)
Pt is being discharged. Discharge papers given and ex[plained to pt. Pt verbalized understanding. Meds and f/u appointment reviewed with pt. RX given.

## 2016-10-27 LAB — CULTURE, BLOOD (ROUTINE X 2)
Culture: NO GROWTH
Culture: NO GROWTH
SPECIAL REQUESTS: ADEQUATE
Special Requests: ADEQUATE

## 2016-10-27 NOTE — Progress Notes (Signed)
[place] was admitted to the Hospital on 10/22/2016 and Discharged  10/27/2016 and should be excused from work/school   for 7 days starting 10/22/2016 , may return to work/school without any restrictions.  Call Abel Presto MD, [facility]  [phone] with questions.  Henreitta Leber M.D on 10/27/2016,at 3:30 PM

## 2018-03-14 ENCOUNTER — Ambulatory Visit
Admission: EM | Admit: 2018-03-14 | Discharge: 2018-03-14 | Disposition: A | Discharge status: Home / Self Care | Payer: PRIVATE HEALTH INSURANCE | Attending: Family Medicine | Admitting: Family Medicine

## 2018-03-14 ENCOUNTER — Other Ambulatory Visit: Payer: Self-pay

## 2018-03-14 ENCOUNTER — Encounter: Payer: Self-pay | Admitting: Emergency Medicine

## 2018-03-14 DIAGNOSIS — L03012 Cellulitis of left finger: Secondary | ICD-10-CM

## 2018-03-14 HISTORY — DX: Morbid (severe) obesity due to excess calories: E66.01

## 2018-03-14 MED ORDER — DOXYCYCLINE HYCLATE 100 MG PO CAPS: 100.0000 mg | ORAL_CAPSULE | Freq: Two times a day (BID) | ORAL | 0 refills | Status: DC

## 2018-03-14 MED ORDER — MUPIROCIN 2 % EX OINT: 1.0000 "application " | TOPICAL_OINTMENT | Freq: Three times a day (TID) | CUTANEOUS | 0 refills | Status: DC

## 2018-03-14 NOTE — ED Triage Notes (Signed)
Patient in today c/o left ring finger infection, pain and swelling x 3 days. Patient denies fever.

## 2018-03-14 NOTE — Discharge Instructions (Addendum)
Wet a washcloth under the faucet and then place in the microwave oven for 10 to 15 seconds.  Place the cloth over the abscess leaving it there for 10 minutes.  Dry the area thoroughly and apply mupirocin ointment to all areas of hardness/redness.  Perform this 3-4 times each and every day until the abscess has resolved.  Be certain to take all of the doxycycline.  If the area appears worsening return to our clinic or go to your primary care physician .  Follow-up in 2 days for recheck of the wound

## 2018-03-14 NOTE — ED Provider Notes (Signed)
[facility]    CSN: [id number] Arrival date & time: 03/14/18  1238     History   Chief Complaint Chief Complaint  Patient presents with  . infected finger    left ring    HPI Patrick Duncan is a 53 y.o. male.   HPI  53 year Old male presents with left ring finger paronychial infection.  He has had the pain and swelling for 3 days.  Has had no fever or chills.  He has had more pain recently and has become tense.  He has a large amount of erythema along the nail bed base of the third nondominant finger.       Past Medical History:  Diagnosis Date  . Depression   . Diabetes mellitus without complication ([place])   . HLD (hyperlipidemia)   . Hypertension   . Morbid obesity ([place])   . Sleep apnea     Patient Active Problem List   Diagnosis Date Noted  . Cellulitis 10/22/2016  . Diabetes ([place]) 10/22/2016  . HTN (hypertension) 10/22/2016  . HLD (hyperlipidemia) 10/22/2016  . OSA (obstructive sleep apnea) 10/22/2016  . AKI (acute kidney injury) ([place]) 10/22/2016    Past Surgical History:  Procedure Laterality Date  . TOOTH EXTRACTION         Home Medications    Prior to Admission medications   Medication Sig Start Date End Date Taking? Authorizing Provider  amLODipine (NORVASC) 10 MG tablet Take 10 mg by mouth daily. 05/18/16  Yes [provider]  chlorthalidone (HYGROTON) 25 MG tablet Take 25 mg by mouth daily. 05/18/16  Yes [provider]  fenofibrate 160 MG tablet Take 1 tablet by mouth daily. 07/25/17  Yes [provider]  fexofenadine (ALLEGRA) 180 MG tablet Take 180 mg by mouth daily.   Yes [provider]  fluticasone (FLONASE) 50 MCG/ACT nasal spray Place 2 sprays into both nostrils daily as needed. 05/04/16  Yes [provider]  glimepiride (AMARYL) 1 MG tablet Take 1 mg by mouth daily. 09/22/16  Yes [provider]  metFORMIN (GLUCOPHAGE-XR) 500 MG 24 hr tablet Take 500 mg by mouth 2 (two)  times daily. 07/31/16  Yes [provider]  metoprolol tartrate (LOPRESSOR) 50 MG tablet Take 50 mg by mouth 2 (two) times daily. 08/23/16  Yes [provider]  pioglitazone (ACTOS) 30 MG tablet Take 30 mg by mouth daily. 07/27/16  Yes [provider]  pseudoephedrine-guaifenesin (MUCINEX D) 60-600 MG 12 hr tablet Take 1 tablet by mouth every 12 (twelve) hours.   Yes [provider]  ramipril (ALTACE) 10 MG capsule Take 10 mg by mouth 2 (two) times daily. 05/18/16  Yes [provider]  Semaglutide 7 MG TABS Take 1 tablet by mouth daily. 02/04/18  Yes [provider]  terazosin (HYTRIN) 10 MG capsule Take 10 mg by mouth daily.  05/18/16  Yes [provider]  traZODone (DESYREL) 150 MG tablet Take 150 mg by mouth at bedtime. 09/06/16  Yes [provider]  trolamine salicylate (ASPERCREME) 10 % cream Apply 1 application topically as needed for muscle pain.   Yes [provider]  vitamin B-12 (CYANOCOBALAMIN) 1000 MCG tablet Take 1,000 mcg by mouth daily.   Yes [provider]  doxycycline (VIBRAMYCIN) 100 MG capsule Take 1 capsule (100 mg total) by mouth 2 (two) times daily. 03/14/18   Lorin Picket, PA-C  mupirocin ointment (BACTROBAN) 2 % Apply 1 application topically 3 (three) times daily.  03/14/18   Lorin Picket, PA-C  naproxen sodium (ALEVE) 220 MG tablet Take 220 mg by mouth daily as needed.    [provider]    Family History Family History  Problem Relation Age of Onset  . Benign prostatic hyperplasia Father   . Psoriasis Father   . Hypertension Mother   . Hyperlipidemia Mother   . Diabetes Maternal Grandmother   . Diabetes Paternal Grandmother     Social History Social History   Tobacco Use  . Smoking status: Never Smoker  . Smokeless tobacco: Never Used  Substance Use Topics  . Alcohol use: No  . Drug use: No     Allergies   Ampicillin and Claritin [loratadine]   Review of  Systems Review of Systems  Constitutional: Positive for activity change. Negative for chills, fatigue and fever.  Skin: Positive for color change and wound.  All other systems reviewed and are negative.    Physical Exam Triage Vital Signs ED Triage Vitals  Enc Vitals Group     BP 03/14/18 1302 139/66     Pulse Rate 03/14/18 1302 81     Resp 03/14/18 1302 18     Temp 03/14/18 1302 97.7 F (36.5 C)     Temp Source 03/14/18 1302 Oral     SpO2 03/14/18 1302 95 %     Weight 03/14/18 1302 (!) 350 lb (158.8 kg)     Height 03/14/18 1302 5\' 7"  (1.702 m)     Head Circumference --      Peak Flow --      Pain Score 03/14/18 1301 5     Pain Loc --      Pain Edu? --      Excl. in [place]? --    No data found.  Updated Vital Signs BP 139/66 (BP Location: Left Arm)   Pulse 81   Temp 97.7 F (36.5 C) (Oral)   Resp 18   Ht 5\' 7"  (1.702 m)   Wt (!) 350 lb (158.8 kg)   SpO2 95%   BMI 54.82 kg/m   Visual Acuity Right Eye Distance:   Left Eye Distance:   Bilateral Distance:    Right Eye Near:   Left Eye Near:    Bilateral Near:     Physical Exam Vitals signs and nursing note reviewed.  Constitutional:      General: He is not in acute distress.    Appearance: Normal appearance. He is obese. He is not ill-appearing, toxic-appearing or diaphoretic.  HENT:     Head: Normocephalic and atraumatic.     Nose: Nose normal.  Eyes:     General:        Right eye: No discharge.        Left eye: No discharge.     Conjunctiva/sclera: Conjunctivae normal.  Neck:     Musculoskeletal: Normal range of motion and neck supple.  Musculoskeletal: Normal range of motion.        General: Swelling and tenderness present.  Skin:    General: Skin is warm and dry.     Findings: Erythema present.     Comments: Emanation of the left nondominant ring finger shows a large paronychia of the base of the nail.  Very purulent.  Has surrounding erythema.  The finger pulp is firm but no specific fluctuance is  perceived.  Neurological:     General: No focal deficit present.     Mental Status: He is alert and oriented to person,  place, and time.  Psychiatric:        Mood and Affect: Mood normal.        Behavior: Behavior normal.        Thought Content: Thought content normal.        Judgment: Judgment normal.      UC Treatments / Results  Labs (all labs ordered are listed, but only abnormal results are displayed) Labs Reviewed - No data to display  EKG None  Radiology No results found.  Procedures After informed consent and explaining the procedure to the patient.  The area of the distal finger was prepped with Hibiclens.  18-gauge needle on a 5 cc syringe was then utilized to lightly puncture the area of purulence with immediate return of large amount of pus.  This was expressed more with gentle pressure.  Afterwards a dry sterile dressing was applied along with a stack splint for protection.  She tolerated the procedure well  Medications Ordered in UC Medications - No data to display  Initial Impression / Assessment and Plan / UC Course  I have reviewed the triage vital signs and the nursing notes.  Pertinent labs & imaging results that were available during my care of the patient were reviewed by me and considered in my medical decision making (see chart for details).   Patient will apply warm compresses 3-4 times a day.  Full instructions were provided to the patient.  The area thoroughly and apply Bactroban ointment.  I also placed him on doxycycline twice daily for 7 days.  We will see him in 2 days for follow-up to make sure that a felon has not developed.  Final Clinical Impressions(s) / UC Diagnoses   Final diagnoses:  Paronychia of left ring finger     Discharge Instructions     Wet a washcloth under the faucet and then place in the microwave oven for 10 to 15 seconds.  Place the cloth over the abscess leaving it there for 10 minutes.  Dry the area thoroughly and  apply mupirocin ointment to all areas of hardness/redness.  Perform this 3-4 times each and every day until the abscess has resolved.  Be certain to take all of the doxycycline.  If the area appears worsening return to our clinic or go to your primary care physician .  Follow-up in 2 days for recheck of the wound   ED Prescriptions    Medication Sig Dispense Auth. Provider   mupirocin ointment (BACTROBAN) 2 % Apply 1 application topically 3 (three) times daily. 22 g Crecencio Mc P, PA-C   doxycycline (VIBRAMYCIN) 100 MG capsule Take 1 capsule (100 mg total) by mouth 2 (two) times daily. 14 capsule Lorin Picket, PA-C     Controlled Substance Prescriptions [place] Controlled Substance Registry consulted? Not Applicable   Lorin Picket, PA-C 03/14/18 [id number]

## 2018-03-16 ENCOUNTER — Other Ambulatory Visit: Payer: Self-pay

## 2018-03-16 ENCOUNTER — Ambulatory Visit
Admission: EM | Admit: 2018-03-16 | Discharge: 2018-03-16 | Disposition: A | Discharge status: Home / Self Care | Payer: PRIVATE HEALTH INSURANCE | Attending: Family Medicine | Admitting: Family Medicine

## 2018-03-16 ENCOUNTER — Encounter: Payer: Self-pay | Admitting: Emergency Medicine

## 2018-03-16 DIAGNOSIS — Z5189 Encounter for other specified aftercare: Secondary | ICD-10-CM

## 2018-03-16 DIAGNOSIS — Z4800 Encounter for change or removal of nonsurgical wound dressing: Secondary | ICD-10-CM

## 2018-03-16 DIAGNOSIS — L03012 Cellulitis of left finger: Secondary | ICD-10-CM

## 2018-03-16 NOTE — ED Provider Notes (Signed)
[facility] ____________________________________________  Time seen: Approximately 11:05 AM  I have reviewed the triage vital signs and the nursing notes.   HISTORY  Chief Complaint Wound Check   HPI Patrick Duncan is a 53 y.o. male presenting for evaluation of wound check to left ring finger.  Patient was seen in urgent care 2 days ago for infection to ring finger and he had an I&D performed.  Started on doxycycline and Bactroban.  Patient states that the area is still red and only slightly tender, but states he feels much better than previously.  States no accompanying fevers.  States pain is better and able to sleep better at night.  Also reports swelling is improved.  Has continued to apply warm compresses and keeping clean.  Denies decreased range of motion, increased pain or pain radiation.  Sofie Hartigan, MD: PCP   Past Medical History:  Diagnosis Date  . Depression   . Diabetes mellitus without complication ([place])   . HLD (hyperlipidemia)   . Hypertension   . Morbid obesity ([place])   . Sleep apnea     Patient Active Problem List   Diagnosis Date Noted  . Cellulitis 10/22/2016  . Diabetes ([place]) 10/22/2016  . HTN (hypertension) 10/22/2016  . HLD (hyperlipidemia) 10/22/2016  . OSA (obstructive sleep apnea) 10/22/2016  . AKI (acute kidney injury) ([place]) 10/22/2016    Past Surgical History:  Procedure Laterality Date  . TOOTH EXTRACTION       No current facility-administered medications for this encounter.   Current Outpatient Medications:  .  amLODipine (NORVASC) 10 MG tablet, Take 10 mg by mouth daily., Disp: , Rfl:  .  chlorthalidone (HYGROTON) 25 MG tablet, Take 25 mg by mouth daily., Disp: , Rfl:  .  doxycycline (VIBRAMYCIN) 100 MG capsule, Take 1 capsule (100 mg total) by mouth 2 (two) times daily., Disp: 14 capsule, Rfl: 0 .  fenofibrate 160 MG tablet, Take 1 tablet by mouth daily., Disp: , Rfl:  .  fexofenadine (ALLEGRA) 180 MG tablet,  Take 180 mg by mouth daily., Disp: , Rfl:  .  fluticasone (FLONASE) 50 MCG/ACT nasal spray, Place 2 sprays into both nostrils daily as needed., Disp: , Rfl:  .  glimepiride (AMARYL) 1 MG tablet, Take 1 mg by mouth daily., Disp: , Rfl:  .  metFORMIN (GLUCOPHAGE-XR) 500 MG 24 hr tablet, Take 500 mg by mouth 2 (two) times daily., Disp: , Rfl:  .  metoprolol tartrate (LOPRESSOR) 50 MG tablet, Take 50 mg by mouth 2 (two) times daily., Disp: , Rfl:  .  mupirocin ointment (BACTROBAN) 2 %, Apply 1 application topically 3 (three) times daily., Disp: 22 g, Rfl: 0 .  naproxen sodium (ALEVE) 220 MG tablet, Take 220 mg by mouth daily as needed., Disp: , Rfl:  .  pioglitazone (ACTOS) 30 MG tablet, Take 30 mg by mouth daily., Disp: , Rfl:  .  ramipril (ALTACE) 10 MG capsule, Take 10 mg by mouth 2 (two) times daily., Disp: , Rfl:  .  Semaglutide 7 MG TABS, Take 1 tablet by mouth daily., Disp: , Rfl:  .  terazosin (HYTRIN) 10 MG capsule, Take 10 mg by mouth daily. , Disp: , Rfl:  .  traZODone (DESYREL) 150 MG tablet, Take 150 mg by mouth at bedtime., Disp: , Rfl:  .  trolamine salicylate (ASPERCREME) 10 % cream, Apply 1 application topically as needed for muscle pain., Disp: , Rfl:  .  vitamin B-12 (CYANOCOBALAMIN) 1000 MCG tablet, Take  1,000 mcg by mouth daily., Disp: , Rfl:  .  pseudoephedrine-guaifenesin (MUCINEX D) 60-600 MG 12 hr tablet, Take 1 tablet by mouth every 12 (twelve) hours., Disp: , Rfl:   Allergies Ampicillin and Claritin [loratadine]  Family History  Problem Relation Age of Onset  . Benign prostatic hyperplasia Father   . Psoriasis Father   . Hypertension Mother   . Hyperlipidemia Mother   . Diabetes Maternal Grandmother   . Diabetes Paternal Grandmother     Social History Social History   Tobacco Use  . Smoking status: Never Smoker  . Smokeless tobacco: Never Used  Substance Use Topics  . Alcohol use: No  . Drug use: No    Review of Systems Constitutional: No  fever Cardiovascular: Denies chest pain. Respiratory: Denies shortness of breath. Gastrointestinal: No abdominal pain.  Skin: Negative for rash.  ____________________________________________   PHYSICAL EXAM:  VITAL SIGNS: ED Triage Vitals  Enc Vitals Group     BP 03/16/18 0919 (!) 147/67     Pulse Rate 03/16/18 0919 82     Resp 03/16/18 0919 17     Temp 03/16/18 0919 97.7 F (36.5 C)     Temp Source 03/16/18 0919 Oral     SpO2 03/16/18 0919 96 %     Weight 03/16/18 0916 (!) 350 lb (158.8 kg)     Height 03/16/18 0916 5\' 7"  (1.702 m)     Head Circumference --      Peak Flow --      Pain Score 03/16/18 0916 0     Pain Loc --      Pain Edu? --      Excl. in [place]? --     Constitutional: Alert and oriented. Well appearing and in no acute distress.  ENT      Head: Normocephalic and atraumatic. Cardiovascular: Normal rate, regular rhythm. Grossly normal heart sounds.  Good peripheral circulation. Respiratory: Normal respiratory effort without tachypnea nor retractions. Breath sounds are clear and equal bilaterally. No wheezes, rales, rhonchi. Musculoskeletal: Steady gait. Neurologic:  Normal speech and language. Speech is normal. No gait instability.  Skin:  Skin is warm, dry.  Except: Left hand ring finger distal dorsal aspect and surrounding base of nail erythema with minimal edema, minimal purulence at cuticle base with expression, mild tenderness to palpation, full range of motion present, normal distal sensation, no motor or tendon deficit noted, no palmar tenderness or edema. Psychiatric: Mood and affect are normal. Speech and behavior are normal. Patient exhibits appropriate insight and judgment   ___________________________________________   LABS (all labs ordered are listed, but only abnormal results are displayed)  Labs Reviewed - No data to display ____________________________________________  PROCEDURES Procedures   INITIAL IMPRESSION / ASSESSMENT AND PLAN / ED  COURSE  Pertinent labs & imaging results that were available during my care of the patient were reviewed by me and considered in my medical decision making (see chart for details).  Well-appearing patient.  No acute distress.  Wound appears to be improving.  Continue current care, discussed cleaning as well as supportive care.  Discussed red follow-up and return parameters.  Discussed follow up with Primary care physician this week. Discussed follow up and return parameters including no resolution or any worsening concerns. Patient verbalized understanding and agreed to plan.   ____________________________________________   FINAL CLINICAL IMPRESSION(S) / ED DIAGNOSES  Final diagnoses:  Acute paronychia of finger of left hand  Visit for wound check     ED Discharge Orders  None       Note: This dictation was prepared with Dragon dictation along with smaller phrase technology. Any transcriptional errors that result from this process are unintentional.         Marylene Land, NP 03/16/18 1151

## 2018-03-16 NOTE — Discharge Instructions (Addendum)
Continue medication as prescribed.  Clean as discussed.  Allow some open air time.  Continue warm compresses.  Closely monitor as discussed.  Follow-up in 2 to 3 days as needed.  Follow up with your primary care physician this week as needed. Return to Urgent care for new or worsening concerns.

## 2018-03-16 NOTE — ED Triage Notes (Signed)
Patient here for wound check to his left ring finger.  Patient was last seen here on 03/14/18. Patient reports continued redness.

## 2018-06-13 ENCOUNTER — Other Ambulatory Visit: Payer: Self-pay

## 2018-06-13 ENCOUNTER — Other Ambulatory Visit (HOSPITAL_COMMUNITY): Payer: Self-pay | Admitting: Internal Medicine

## 2018-06-13 ENCOUNTER — Ambulatory Visit
Admission: RE | Admit: 2018-06-13 | Discharge: 2018-06-13 | Disposition: A | Discharge status: Home / Self Care | Payer: PRIVATE HEALTH INSURANCE | Source: Ambulatory Visit | Attending: Internal Medicine | Admitting: Internal Medicine

## 2018-06-13 ENCOUNTER — Other Ambulatory Visit: Payer: Self-pay | Admitting: Internal Medicine

## 2018-06-13 DIAGNOSIS — R600 Localized edema: Secondary | ICD-10-CM | POA: Diagnosis not present

## 2019-06-30 ENCOUNTER — Other Ambulatory Visit: Payer: Self-pay | Admitting: Dermatology

## 2019-09-11 ENCOUNTER — Ambulatory Visit (INDEPENDENT_AMBULATORY_CARE_PROVIDER_SITE_OTHER): Payer: PRIVATE HEALTH INSURANCE | Admitting: Dermatology

## 2019-09-11 ENCOUNTER — Other Ambulatory Visit: Payer: Self-pay

## 2019-09-11 DIAGNOSIS — H33329 Round hole, unspecified eye: Secondary | ICD-10-CM

## 2019-09-11 DIAGNOSIS — R351 Nocturia: Secondary | ICD-10-CM | POA: Diagnosis not present

## 2019-09-11 DIAGNOSIS — L304 Erythema intertrigo: Secondary | ICD-10-CM

## 2019-09-11 DIAGNOSIS — L732 Hidradenitis suppurativa: Secondary | ICD-10-CM | POA: Diagnosis not present

## 2019-09-11 DIAGNOSIS — Z8669 Personal history of other diseases of the nervous system and sense organs: Secondary | ICD-10-CM

## 2019-09-11 MED ORDER — DOXYCYCLINE HYCLATE 100 MG PO CAPS: ORAL_CAPSULE | ORAL | 5 refills | Status: DC

## 2019-09-11 MED ORDER — KETOCONAZOLE 2 % EX CREA: TOPICAL_CREAM | CUTANEOUS | 5 refills | Status: DC

## 2019-09-11 NOTE — Patient Instructions (Signed)
Continue doxycycline 100mg  take 1 capsule by mouth every day with food and plenty of fluids. Doxycycline should be taken with food to prevent nausea. Do not lay down for 30 minutes after taking. Be cautious with sun exposure and use good sun protection while on this medication. Pregnant women should not take this medication.    Continue CLn wash to the groin/buttocks/upper thighs in the shower daily.   Continue ketoconazole 2% cream to affected areas rash in groin once a day as needed.  Start CeraVe Cream - Apply to groin area every night before putting on diaper.

## 2019-09-11 NOTE — Progress Notes (Signed)
   Follow-Up Visit   Subjective  Patrick Duncan is a 54 y.o. male who presents for the following: Hidradenitis suppurativa (6 month follow-up, improved. Patient states he has 1-2 outbreaks on buttocks, upper legs each month. He is taking doxycycline 100 1 po QD and using CLn sport wash. The medicine helps decrease size and number of bumps.) and Intertrigo (groin. Few flares, but improves with ketoconazole 2% cream.). Pt is satisfied with his improvement and control for Hidradenitis and Intertrigo on current treatments and prefers to continue for now.  He does not want to pursue Humira at this time.  The following portions of the chart were reviewed this encounter and updated as appropriate:  Tobacco  Allergies  Meds  Problems  Med Hx  Surg Hx  Fam Hx     Review of Systems:  No other skin or systemic complaints except as noted in HPI or Assessment and Plan.  Objective  Well appearing patient in no apparent distress; mood and affect are within normal limits.  A focused examination was performed including face, groin, legs. Relevant physical exam findings are noted in the Assessment and Plan.  Objective  Buttocks, Upper thighs: No outbreaks today per patient. Not examined today.  Objective  Groin: Patient states he is clear today. Not examined.  Objective  Groin: Patient not examined today.   Assessment & Plan  Hidradenitis suppurativa Buttocks, Upper thighs  Continue doxycycline 100mg  take 1 po QD with food and plenty of drink dsp #30 5Rf.  Continue CLn Sport Wash QD in shower.  Discussed Humira, but don't recommend at this time. Patient is pleased and doing well with current treatment.  Doxycycline should be taken with food to prevent nausea. Do not lay down for 30 minutes after taking. Be cautious with sun exposure and use good sun protection while on this medication. Pregnant women should not take this medication.     Erythema intertrigo Groin  Improved with  current treatment.  Continue ketoconazole 2% cream Apply to AAs QD prn rash dsp 60g 5Rf.  ketoconazole (NIZORAL) 2 % cream - Groin  Nocturia Groin  With accidents  Discussed with patient this may exacerbate intertrigo of the groin and may need to use Ketoconazole 2% Cream more often.  Recommend patient use CeraVe Cream at night to groin area under diaper.  Retinal hole, unspecified laterality Eye  History of  Patient to continue regular follow-ups with ophthalmologist.  History of sleep apnea Head  Patient to continue regular follow-ups with pulmonologist.  Return in about 6 months (around 03/13/2020) for HS, Intertrigo.  Lindi Adie, CMA, am acting as scribe for Sarina Ser, MD .  Documentation: I have reviewed the above documentation for accuracy and completeness, and I agree with the above.  Sarina Ser, MD

## 2019-09-18 ENCOUNTER — Encounter: Payer: Self-pay | Admitting: Dermatology

## 2020-03-16 ENCOUNTER — Other Ambulatory Visit: Payer: Self-pay

## 2020-03-16 ENCOUNTER — Ambulatory Visit (INDEPENDENT_AMBULATORY_CARE_PROVIDER_SITE_OTHER): Payer: 59 | Admitting: Dermatology

## 2020-03-16 ENCOUNTER — Encounter: Payer: Self-pay | Admitting: Dermatology

## 2020-03-16 DIAGNOSIS — L02416 Cutaneous abscess of left lower limb: Secondary | ICD-10-CM

## 2020-03-16 DIAGNOSIS — L0291 Cutaneous abscess, unspecified: Secondary | ICD-10-CM

## 2020-03-16 DIAGNOSIS — L732 Hidradenitis suppurativa: Secondary | ICD-10-CM

## 2020-03-16 DIAGNOSIS — L03116 Cellulitis of left lower limb: Secondary | ICD-10-CM | POA: Diagnosis not present

## 2020-03-16 MED ORDER — DOXYCYCLINE HYCLATE 100 MG PO TABS: 100.0000 mg | ORAL_TABLET | Freq: Two times a day (BID) | ORAL | 2 refills | Status: AC

## 2020-03-16 MED ORDER — MUPIROCIN 2 % EX OINT: 1.0000 "application " | TOPICAL_OINTMENT | Freq: Two times a day (BID) | CUTANEOUS | 1 refills | Status: DC

## 2020-03-16 NOTE — Patient Instructions (Signed)
Increase Doxycycline 100 mg 1 tablet twice daily with food and plenty of fluid.  Clean area daily with soap and water. Apply Mupirocin ointment daily.

## 2020-03-16 NOTE — Progress Notes (Signed)
   Follow-Up Visit   Subjective  Patrick Duncan is a 55 y.o. male who presents for the following: Other (Sores on back of left leg since January. They have been bleeding and are very sore. Had a flare of hidradenitis in January as well as a upper respiratory infection).  The following portions of the chart were reviewed this encounter and updated as appropriate:   Tobacco  Allergies  Meds  Problems  Med Hx  Surg Hx  Fam Hx     Review of Systems:  No other skin or systemic complaints except as noted in HPI or Assessment and Plan.  Objective  Well appearing patient in no apparent distress; mood and affect are within normal limits.  A focused examination was performed including left post thigh. Relevant physical exam findings are noted in the Assessment and Plan.  Objective  Left Thigh - Posterior: 2 ulcers with crust   Assessment & Plan  Hidradenitis suppurativa - flare Left Thigh - Posterior Increase Doxycyline 100 mg 1 po bid with food and plenty of fluid  Hidradenitis Suppurativa is a chronic; persistent; non-curable, but treatable condition due to abnormal inflamed sweat glands in the body folds (axilla, inframammary, groin, medial thighs), causing recurrent painful cysts and scarring. It can be associated with severe scarring acne and cysts; abscesses and scarring of scalp. The goal is control and prevention of flares, as it is not curable. Scars are permanent and can be thickened. Treatment may include daily use of topical medication and oral antibiotics.  Oral isotretinoin may also be helpful.  For more severe cases, Humira (a biologic injection) may be prescribed to decrease the inflammatory process and prevent flares.  When indicated, inflamed cysts may also be treated surgically.  doxycycline (VIBRA-TABS) 100 MG tablet - Left Thigh - Posterior  Cellulitis of left lower extremity Left Thigh - Posterior I & D followed by bacterial culture performed today followed by  mupirocin and dressing mupirocin ointment (BACTROBAN) 2 % - Left Thigh - Posterior  Anaerobic and Aerobic Culture - Left Thigh - Posterior  Incision and Drainage - Left Thigh - Posterior Location: left post thigh  Informed Consent: Discussed risks (permanent scarring, light or dark discoloration, infection, pain, bleeding, bruising, redness, damage to adjacent structures, and recurrence of the lesion) and benefits of the procedure, as well as the alternatives.  Informed consent was obtained.  Preparation: The area was prepped with alcohol.  Anesthesia: Lidocaine 1% with epinephrine  Procedure Details: An incision was made overlying the lesion. The lesion drained pus and blood.  A small amount of fluid was drained.    Antibiotic ointment and a sterile pressure dressing were applied. The patient tolerated procedure well.  Total number of lesions drained: 1  Plan: The patient was instructed on post-op care. Recommend OTC analgesia as needed for pain.  Return for Follow up as scheduled.  I, Ashok Cordia, CMA, am acting as scribe for Sarina Ser, MD .  Documentation: I have reviewed the above documentation for accuracy and completeness, and I agree with the above.  Sarina Ser, MD

## 2020-03-21 LAB — ANAEROBIC AND AEROBIC CULTURE

## 2020-03-22 ENCOUNTER — Ambulatory Visit: Payer: PRIVATE HEALTH INSURANCE | Admitting: Dermatology

## 2020-03-22 ENCOUNTER — Telehealth: Payer: Self-pay

## 2020-03-22 MED ORDER — CEPHALEXIN 500 MG PO CAPS: 500.0000 mg | ORAL_CAPSULE | Freq: Two times a day (BID) | ORAL | 0 refills | Status: AC

## 2020-03-22 NOTE — Telephone Encounter (Signed)
Tried to call patient regarding cultures results. Mailbox is full and could not leave a message/hd

## 2020-03-22 NOTE — Telephone Encounter (Signed)
Patient advised and RX sent in 

## 2020-03-22 NOTE — Telephone Encounter (Signed)
-----   Message from Ralene Bathe, MD sent at 03/22/2020  9:05 AM EST ----- Culture + for Staphylococcus aureus (not susceptible to Tetracycline) Discontinue Doxycycline for now. Start Cephalexin 500 mg 1 po bid disp #20 0rf   -- send in to pharmacy  Keep follow up appt

## 2020-03-22 NOTE — Addendum Note (Signed)
Addended by: Johnsie Kindred R on: 03/22/2020 11:36 AM   Modules accepted: Orders

## 2020-04-05 ENCOUNTER — Other Ambulatory Visit: Payer: Self-pay

## 2020-04-05 ENCOUNTER — Ambulatory Visit (INDEPENDENT_AMBULATORY_CARE_PROVIDER_SITE_OTHER): Payer: 59 | Admitting: Dermatology

## 2020-04-05 DIAGNOSIS — L03116 Cellulitis of left lower limb: Secondary | ICD-10-CM | POA: Diagnosis not present

## 2020-04-05 DIAGNOSIS — L03115 Cellulitis of right lower limb: Secondary | ICD-10-CM

## 2020-04-05 DIAGNOSIS — L304 Erythema intertrigo: Secondary | ICD-10-CM

## 2020-04-05 DIAGNOSIS — L732 Hidradenitis suppurativa: Secondary | ICD-10-CM | POA: Diagnosis not present

## 2020-04-05 DIAGNOSIS — L03119 Cellulitis of unspecified part of limb: Secondary | ICD-10-CM

## 2020-04-05 MED ORDER — CEPHALEXIN 500 MG PO CAPS: 500.0000 mg | ORAL_CAPSULE | Freq: Two times a day (BID) | ORAL | 0 refills | Status: AC

## 2020-04-05 NOTE — Progress Notes (Signed)
   Follow-Up Visit   Subjective  Patrick Duncan is a 55 y.o. male who presents for the following: Follow-up (Cellulitis with + staph culture follow up of left post thigh - healed but he had another place come up on his right thigh. That place is healing. He finished Cephalexin last Thursday). Patient also with hidradenitis and intertrigo.  The following portions of the chart were reviewed this encounter and updated as appropriate:   Tobacco  Allergies  Meds  Problems  Med Hx  Surg Hx  Fam Hx     Review of Systems:  No other skin or systemic complaints except as noted in HPI or Assessment and Plan.  Objective  Well appearing patient in no apparent distress; mood and affect are within normal limits.  A focused examination was performed including legs. Relevant physical exam findings are noted in the Assessment and Plan.  Objective  Right post lat thigh, left post thigh: Healing I&D sites of left post thigh.  Furuncle of right post lat thigh.   Assessment & Plan  Cellulitis of lower extremity - new spot + culture for Staph aureus (resistant to doxycycline; sensitive to Cephalexin) Right post lat thigh, left post thigh New area of cellulitis and furunculosis of right post lat thigh - restart Cephalexin 500 mg 1 po bid   Start Cln sport wash to legs daily  cephALEXin (KEFLEX) 500 MG capsule - Right post lat thigh, left post thigh  Hidradenitis suppurativa Groin area Hidradenitis Suppurativa is a chronic; persistent; non-curable, but treatable condition due to abnormal inflamed sweat glands in the body folds (axilla, inframammary, groin, medial thighs), causing recurrent painful cysts and scarring. It can be associated with severe scarring acne and cysts; abscesses and scarring of scalp. The goal is control and prevention of flares, as it is not curable. Scars are permanent and can be thickened. Treatment may include daily use of topical medication and oral antibiotics.  Oral  isotretinoin may also be helpful.  For more severe cases, Humira (a biologic injection) may be prescribed to decrease the inflammatory process and prevent flares.  When indicated, inflamed cysts may also be treated surgically.  Restart Doxycycline 100 mg 1 po qd with food and plenty of fluid in 3 weeks.  Continue Cln Sport wash daily  Consider Humira and/or Isotretinoin in future.  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  It is exacerbated by increased movement / activity; sweating; and higher atmospheric temperature.  Continue Ketoconazole 2% cream qhs prn  Other Related Medications ketoconazole (NIZORAL) 2 % cream  Return in about 2 months (around 06/05/2020).   I, Ashok Cordia, CMA, am acting as scribe for Sarina Ser, MD .  Documentation: I have reviewed the above documentation for accuracy and completeness, and I agree with the above.  Sarina Ser, MD

## 2020-04-05 NOTE — Patient Instructions (Addendum)
Restart Cephalexin 500 mg 1 tablet twice daily  Continue Cln sport wash to legs and groin daily  Restart Doxycycline 100 mg 1 tablet daily with food and plenty of fluid in 3 weeks.

## 2020-04-07 ENCOUNTER — Encounter: Payer: Self-pay | Admitting: Dermatology

## 2020-04-20 ENCOUNTER — Telehealth: Payer: Self-pay

## 2020-04-20 NOTE — Telephone Encounter (Signed)
Hives (if he has hives) can be due to infection or medication or several other reasons. He had and infection and is on a new medication (cephalexin). It sounds fine to try Benadryl to see how he does.

## 2020-04-20 NOTE — Telephone Encounter (Signed)
Pt called to let us know about a rash that has started on mainly his left arm and some on the right arm. He states that he thinks it may be due to the cephalexin. Pt saw his primary again this morning and they told him it looked like hives and told him to call us. I offered pt a spot with Dr. Nicole Kindred today to be seen due to your schedule being full. Pt declined and wants to try benadryl and will call back if not cleared.

## 2020-05-11 ENCOUNTER — Telehealth: Payer: Self-pay

## 2020-05-11 NOTE — Telephone Encounter (Signed)
Patient called over the weekend with a new spot on leg. I called patient yesterday but he did not answer and his voicemail is full. Spoke with patient this morning and patient went to Urgent Care yesterday. He was prescribed a 7 day course of Sulfa Antibiotic 800-160mg . Advised patient to finish course prescribed by urgent care yesterday and too call early next week if he has no improvement.

## 2020-05-19 ENCOUNTER — Ambulatory Visit (INDEPENDENT_AMBULATORY_CARE_PROVIDER_SITE_OTHER): Payer: 59 | Admitting: Dermatology

## 2020-05-19 ENCOUNTER — Other Ambulatory Visit: Payer: Self-pay

## 2020-05-19 DIAGNOSIS — L03115 Cellulitis of right lower limb: Secondary | ICD-10-CM

## 2020-05-19 DIAGNOSIS — L03818 Cellulitis of other sites: Secondary | ICD-10-CM

## 2020-05-19 NOTE — Progress Notes (Signed)
   Follow-Up Visit   Subjective  Patrick Duncan is a 55 y.o. male who presents for the following: Skin Problem (Pt c/o sore spot on right leg x 2 weeks pt went to the walk in clinic and was prescribed Sulfa ds tablets he finished 2 nights ago ).  The following portions of the chart were reviewed this encounter and updated as appropriate:   Tobacco  Allergies  Meds  Problems  Med Hx  Surg Hx  Fam Hx     Review of Systems:  No other skin or systemic complaints except as noted in HPI or Assessment and Plan.  Objective  Well appearing patient in no apparent distress; mood and affect are within normal limits.  A focused examination was performed including face, right thigh . Relevant physical exam findings are noted in the Assessment and Plan.  Objective  Right Thigh: 6 cm edematous, erythematous plaque with a central 1 cm ulcer    Assessment & Plan  Cellulitis with ulcer and drainage of the right thigh. Treated with oral sulfur antibiotics and improving Right Thigh Bacterial culture performed today  Patient finished Bactrim ds 2 days ago, so we will wait to prescribe anything until we get the bacterial culture results   Area cleansed with Puracyn, applied Mupirocin ointment and non stick pad and a wrap. Continue cleansing and mupirocin application at least once a day.  Other Related Procedures Anaerobic and Aerobic Culture  Return if symptoms worsen or fail to improve.  IMarye Round, CMA, am acting as scribe for Sarina Ser, MD .  Documentation: I have reviewed the above documentation for accuracy and completeness, and I agree with the above.  Sarina Ser, MD

## 2020-05-21 ENCOUNTER — Encounter: Payer: Self-pay | Admitting: Dermatology

## 2020-05-27 ENCOUNTER — Telehealth: Payer: Self-pay

## 2020-05-27 LAB — ANAEROBIC AND AEROBIC CULTURE

## 2020-05-27 LAB — SPECIMEN STATUS REPORT

## 2020-05-27 MED ORDER — SULFAMETHOXAZOLE-TRIMETHOPRIM 800-160 MG PO TABS: 1.0000 | ORAL_TABLET | Freq: Two times a day (BID) | ORAL | 0 refills | Status: AC

## 2020-05-27 NOTE — Telephone Encounter (Signed)
Voicemail box full unable to leave a message. Medication sent to pharmacy.

## 2020-05-27 NOTE — Telephone Encounter (Signed)
Patient advised of information per results. Patient was prescribed Doxycycline by PCP due to cellulitis in the groin area. Advised patient to call PCP to advise them of results that we received today and the RX that was sent in by Dr. Nehemiah Massed.   Dr. Nehemiah Massed sending you this message to review due to office visit with PCP yesterday and possible infection there.

## 2020-05-27 NOTE — Telephone Encounter (Signed)
-----   Message from Ralene Bathe, MD sent at 05/27/2020  1:02 PM EDT ----- + Staph aureus - sensitive to sulfa (pt allergic to Penicillin and Cephalexin) Start Bactrim DS 1 po bid x 10 days disp #20 with 1 rf  (generic OK)  Call pt and send in med to Pharmacy

## 2020-05-31 NOTE — Telephone Encounter (Signed)
Called patient but no answer and voicemail box is full. 

## 2020-05-31 NOTE — Telephone Encounter (Signed)
Please advise pt that he had rash to Cephalexin recently. He had just discontinued Bactrim DS (sulfa antibiotic) 2 days prior to his last visit. So, I believe he can take Bactrim DS.  Please let me know if this is incorrect. thanks

## 2020-05-31 NOTE — Telephone Encounter (Signed)
Patient called and left voicemail Friday that he has had rash to Bactrim in the past and does not want to take this. Patient's PCP advised patient to continue Doxycycline due to issue in groin area but also suggested new RX from you.

## 2020-06-02 NOTE — Telephone Encounter (Signed)
Called patient but no answer. Voicemail full.

## 2020-06-02 NOTE — Telephone Encounter (Signed)
Patient advised of information.

## 2020-06-02 NOTE — Telephone Encounter (Signed)
I agree with the PCPs recommendations -- and if all clears, he should be OK as this area was already draining. But - in the future, if further problems -- due to the sensitivities of the bacteria cultured and the allergy to Penicillin; Cepalexin (Oxacillin) and Sulfa meds - all manifest by rashes; we may need to consult an Infectious Disease doctor. We will not do that yet - only if persistent / recurrent issues. Please advise pt and he should keep his fu appts

## 2020-06-02 NOTE — Telephone Encounter (Signed)
Spoke with patient this morning. Patient state he has had hives/rashes to both Cephalexin and Bactrim. Patient went back to PCP who had prescribed him a round of Prednisone which he has completed and told him to continue Doxycycline.

## 2020-06-07 ENCOUNTER — Other Ambulatory Visit: Payer: Self-pay

## 2020-06-07 ENCOUNTER — Ambulatory Visit (INDEPENDENT_AMBULATORY_CARE_PROVIDER_SITE_OTHER): Payer: 59 | Admitting: Dermatology

## 2020-06-07 DIAGNOSIS — L03115 Cellulitis of right lower limb: Secondary | ICD-10-CM

## 2020-06-07 DIAGNOSIS — L02415 Cutaneous abscess of right lower limb: Secondary | ICD-10-CM

## 2020-06-07 MED ORDER — MUPIROCIN 2 % EX OINT: 1.0000 "application " | TOPICAL_OINTMENT | Freq: Two times a day (BID) | CUTANEOUS | 3 refills | Status: DC

## 2020-06-07 MED ORDER — HIBICLENS 4 % EX LIQD: Freq: Every day | CUTANEOUS | 3 refills | Status: DC | PRN

## 2020-06-07 NOTE — Patient Instructions (Signed)

## 2020-06-07 NOTE — Progress Notes (Signed)
   Follow-Up Visit   Subjective  Patrick Duncan is a 55 y.o. male who presents for the following: Follow-up (Cellulitis follow up of right thigh - culture positive for staph and sensitive to cephalexin and sulfa both of which he is sensitive to. He had a rash come up on his scrotum and he saw his PCP, who put him on Prednisone and Doxycycline.).  The following portions of the chart were reviewed this encounter and updated as appropriate:   Tobacco  Allergies  Meds  Problems  Med Hx  Surg Hx  Fam Hx     Review of Systems:  No other skin or systemic complaints except as noted in HPI or Assessment and Plan.  Objective  Well appearing patient in no apparent distress; mood and affect are within normal limits.  A focused examination was performed including legs. Relevant physical exam findings are noted in the Assessment and Plan.  Objective  Right Thigh - Posterior: Healing ulcer sites   Assessment & Plan  Recurrent Cellulitis and abscess of right lower extremity - Culture + for Staph aureus sensitive to Sulfa and Oxicillin (resistant to tetracycline); but pt has allergy to Sulfa and Penicillin and Cephalexin. Currently calm today. Right Thigh - Posterior Areas cleaned with Puracyn. Mupirocin and band aids applied.   Advised patient, due to his rash reactions/ allergy to cephalexin and sulfa antibiotics, if he continues to get abscesses, he will need to evaluated by an infectious disease physician.  Recommend wash legs and groin with Hibiclens daily. Advised patient to apply mupirocin to any open areas.  chlorhexidine (HIBICLENS) 4 % external liquid - Right Thigh - Posterior  mupirocin ointment (BACTROBAN) 2 % - Right Thigh - Posterior  Return in about 6 months (around 12/08/2020) for Follow up.   I, Ashok Cordia, CMA, am acting as scribe for Sarina Ser, MD .  Documentation: I have reviewed the above documentation for accuracy and completeness, and I agree with the  above.  Sarina Ser, MD

## 2020-06-12 ENCOUNTER — Encounter: Payer: Self-pay | Admitting: Dermatology

## 2020-07-12 ENCOUNTER — Encounter: Payer: Self-pay | Admitting: *Deleted

## 2020-07-13 ENCOUNTER — Other Ambulatory Visit: Payer: Self-pay

## 2020-07-13 ENCOUNTER — Encounter: Payer: Self-pay | Admitting: *Deleted

## 2020-07-13 ENCOUNTER — Ambulatory Visit: Payer: 59 | Admitting: Anesthesiology

## 2020-07-13 ENCOUNTER — Encounter
Admission: RE | Disposition: A | Discharge status: Home / Self Care | Payer: Self-pay | Source: Home / Self Care | Attending: Gastroenterology

## 2020-07-13 ENCOUNTER — Ambulatory Visit
Admission: RE | Admit: 2020-07-13 | Discharge: 2020-07-13 | Disposition: A | Discharge status: Home / Self Care | Payer: 59 | Attending: Gastroenterology | Admitting: Gastroenterology

## 2020-07-13 DIAGNOSIS — E119 Type 2 diabetes mellitus without complications: Secondary | ICD-10-CM | POA: Insufficient documentation

## 2020-07-13 DIAGNOSIS — Z881 Allergy status to other antibiotic agents status: Secondary | ICD-10-CM | POA: Insufficient documentation

## 2020-07-13 DIAGNOSIS — Z79899 Other long term (current) drug therapy: Secondary | ICD-10-CM | POA: Diagnosis not present

## 2020-07-13 DIAGNOSIS — Z880 Allergy status to penicillin: Secondary | ICD-10-CM | POA: Insufficient documentation

## 2020-07-13 DIAGNOSIS — Z7984 Long term (current) use of oral hypoglycemic drugs: Secondary | ICD-10-CM | POA: Insufficient documentation

## 2020-07-13 DIAGNOSIS — I10 Essential (primary) hypertension: Secondary | ICD-10-CM | POA: Insufficient documentation

## 2020-07-13 DIAGNOSIS — Z6841 Body Mass Index (BMI) 40.0 and over, adult: Secondary | ICD-10-CM | POA: Insufficient documentation

## 2020-07-13 DIAGNOSIS — Z1211 Encounter for screening for malignant neoplasm of colon: Secondary | ICD-10-CM | POA: Diagnosis not present

## 2020-07-13 HISTORY — DX: Unspecified hearing loss, unspecified ear: H91.90

## 2020-07-13 HISTORY — DX: Cutaneous abscess of left lower limb: L02.416

## 2020-07-13 HISTORY — DX: Unspecified osteoarthritis, unspecified site: M19.90

## 2020-07-13 HISTORY — PX: COLONOSCOPY WITH PROPOFOL: SHX5780

## 2020-07-13 HISTORY — DX: Selective deficiency of immunoglobulin a (iga): D80.2

## 2020-07-13 HISTORY — DX: Cutaneous abscess of left lower limb: L03.116

## 2020-07-13 HISTORY — DX: Cellulitis of left lower limb: L03.116

## 2020-07-13 HISTORY — DX: Personal history of other diseases of the digestive system: Z87.19

## 2020-07-13 LAB — GLUCOSE, CAPILLARY: Glucose-Capillary: 210 mg/dL — ABNORMAL HIGH (ref 70–99)

## 2020-07-13 SURGERY — COLONOSCOPY WITH PROPOFOL
Anesthesia: General

## 2020-07-13 MED ORDER — PROPOFOL 500 MG/50ML IV EMUL
INTRAVENOUS | Status: AC
  Filled 2020-07-13: qty 50

## 2020-07-13 MED ORDER — SODIUM CHLORIDE 0.9 % IV SOLN: INTRAVENOUS | Status: DC

## 2020-07-13 MED ORDER — PHENYLEPHRINE HCL (PRESSORS) 10 MG/ML IV SOLN
INTRAVENOUS | Status: DC | PRN
  Administered 2020-07-13: 150 ug via INTRAVENOUS

## 2020-07-13 MED ORDER — PROPOFOL 10 MG/ML IV BOLUS
INTRAVENOUS | Status: DC | PRN
  Administered 2020-07-13: 80 mg via INTRAVENOUS

## 2020-07-13 MED ORDER — PROPOFOL 10 MG/ML IV BOLUS
INTRAVENOUS | Status: AC
  Filled 2020-07-13: qty 20

## 2020-07-13 MED ORDER — PROPOFOL 500 MG/50ML IV EMUL
INTRAVENOUS | Status: DC | PRN
  Administered 2020-07-13: 140 ug/kg/min via INTRAVENOUS

## 2020-07-13 NOTE — Op Note (Signed)
[facility] Patient Name: Patrick Duncan Procedure Date: 07/13/2020 11:33 AM MRN: [id number] Account #: [id number] Date of Birth: 1965-05-29 Admit Type: Outpatient Age: 55 Room: [facility] ENDO ROOM 1 Gender: Male Note Status: Finalized Procedure:             Colonoscopy Indications:           Screening for colorectal malignant neoplasm Providers:             Andrey Farmer MD, MD Referring MD:          Sofie Hartigan (Referring MD) Medicines:             Monitored Anesthesia Care Complications:         No immediate complications. Procedure:             Pre-Anesthesia Assessment:                        - Prior to the procedure, a History and Physical was                         performed, and patient medications and allergies were                         reviewed. The patient is competent. The risks and                         benefits of the procedure and the sedation options and                         risks were discussed with the patient. All questions                         were answered and informed consent was obtained.                         Patient identification and proposed procedure were                         verified by the physician, the nurse, the anesthetist                         and the technician in the endoscopy suite. Mental                         Status Examination: alert and oriented. Airway                         Examination: normal oropharyngeal airway and neck                         mobility. Respiratory Examination: clear to                         auscultation. CV Examination: normal. Prophylactic                         Antibiotics: The patient does not require prophylactic  antibiotics. Prior Anticoagulants: The patient has                         taken no previous anticoagulant or antiplatelet                         agents. ASA Grade Assessment: III - A patient with                          severe systemic disease. After reviewing the risks and                         benefits, the patient was deemed in satisfactory                         condition to undergo the procedure. The anesthesia                         plan was to use monitored anesthesia care (MAC).                         Immediately prior to administration of medications,                         the patient was re-assessed for adequacy to receive                         sedatives. The heart rate, respiratory rate, oxygen                         saturations, blood pressure, adequacy of pulmonary                         ventilation, and response to care were monitored                         throughout the procedure. The physical status of the                         patient was re-assessed after the procedure.                        After obtaining informed consent, the colonoscope was                         passed under direct vision. Throughout the procedure,                         the patient's blood pressure, pulse, and oxygen                         saturations were monitored continuously. The                         Colonoscope was introduced through the anus with the                         intention of advancing to the cecum. The scope was  advanced to the ascending colon before the procedure                         was aborted. Medications were given. The colonoscopy                         was aborted due to the difficulty of the procedure.                         The patient tolerated the procedure well. The quality                         of the bowel preparation was good. Findings:      The perianal and digital rectal examinations were normal.      The colon (entire examined portion) appeared normal.      The exam was otherwise without abnormality on direct and retroflexion       views. Impression:            - The entire examined colon is normal.                         - The examination was otherwise normal on direct and                         retroflexion views.                        - No specimens collected. Recommendation:        - Discharge patient to home.                        - Resume previous diet.                        - Perform a virtual colonoscopy at appointment to be                         scheduled. Procedure Code(s):     --- Professional ---                        Z3086, 55, Colorectal cancer screening; colonoscopy on                         individual not meeting criteria for high risk Diagnosis Code(s):     --- Professional ---                        Z12.11, Encounter for screening for malignant neoplasm                         of colon CPT copyright 2019 [employer]. All rights reserved. The codes documented in this report are preliminary and upon coder review may  be revised to meet current compliance requirements. Andrey Farmer MD, MD 07/13/2020 12:17:37 PM Number of Addenda: 0 Note Initiated On: 07/13/2020 11:33 AM Total Procedure Duration: 0 hours 19 minutes 24 seconds  Estimated Blood Loss:  Estimated blood loss: none. Estimated blood loss: none.      [facility]

## 2020-07-13 NOTE — Interval H&P Note (Signed)
History and Physical Interval Note:  07/13/2020 11:43 AM  Patrick Duncan  has presented today for surgery, with the diagnosis of COLON CANCER SCREENING.  The various methods of treatment have been discussed with the patient and family. After consideration of risks, benefits and other options for treatment, the patient has consented to  Procedure(s): COLONOSCOPY WITH PROPOFOL (N/A) as a surgical intervention.  The patient's history has been reviewed, patient examined, no change in status, stable for surgery.  I have reviewed the patient's chart and labs.  Questions were answered to the patient's satisfaction.     Lesly Rubenstein  Ok to proceed with colonoscopy

## 2020-07-13 NOTE — Anesthesia Postprocedure Evaluation (Signed)
Anesthesia Post Note  Patient: DINK CREPS  Procedure(s) Performed: COLONOSCOPY WITH PROPOFOL  Patient location during evaluation: Endoscopy Anesthesia Type: General Level of consciousness: awake and alert Pain management: pain level controlled Vital Signs Assessment: post-procedure vital signs reviewed and stable Respiratory status: spontaneous breathing, nonlabored ventilation, respiratory function stable and patient connected to nasal cannula oxygen Cardiovascular status: blood pressure returned to baseline and stable Postop Assessment: no apparent nausea or vomiting Anesthetic complications: no   No notable events documented.   Last Vitals:  Vitals:   07/13/20 1045 07/13/20 1214  BP: (!) 169/68 (!) 108/43  Pulse: 76 80  Resp: 17 15  Temp: (!) 36.2 C   SpO2: 98% 100%    Last Pain:  Vitals:   07/13/20 1214  TempSrc:   PainSc: 0-No pain                 Martha Clan

## 2020-07-13 NOTE — Transfer of Care (Signed)
Immediate Anesthesia Transfer of Care Note  Patient: Patrick Duncan  Procedure(s) Performed: COLONOSCOPY WITH PROPOFOL  Patient Location: PACU  Anesthesia Type:General  Level of Consciousness: awake and alert   Airway & Oxygen Therapy: Patient Spontanous Breathing and Patient connected to nasal cannula oxygen  Post-op Assessment: Report given to RN and Post -op Vital signs reviewed and stable  Post vital signs: Reviewed and stable  Last Vitals:  Vitals Value Taken Time  BP 108/43 07/13/20 1214  Temp    Pulse 80 07/13/20 1214  Resp 18 07/13/20 1214  SpO2 100 % 07/13/20 1214  Vitals shown include unvalidated device data.  Last Pain:  Vitals:   07/13/20 1045  TempSrc: Temporal  PainSc: 0-No pain         Complications: No notable events documented.

## 2020-07-13 NOTE — H&P (Signed)
Outpatient short stay form Pre-procedure 07/13/2020 11:41 AM Patrick Duncan  Primary Physician: Dr. Ellison Hughs  Reason for visit:  Screening colonoscopy  History of present illness:   55 y/o gentleman with morbid obesity, hypertension, and DM II here for screening colonoscopy. No family history of GI malignancies. No blood thinners. No abdominal surgeries.    Current Facility-Administered Medications:    0.9 %  sodium chloride infusion, , Intravenous, Continuous, Bostyn Kunkler, Hilton Cork, Duncan, Last Rate: 20 mL/hr at 07/13/20 1108, New Bag at 07/13/20 1108  Medications Prior to Admission  Medication Sig Dispense Refill Last Dose   acarbose (PRECOSE) 25 MG tablet Take 25 mg by mouth 3 (three) times daily with meals.   Past Week   acetaminophen (TYLENOL) 650 MG CR tablet Take 650 mg by mouth every 8 (eight) hours as needed for pain.   Past Week   amLODipine (NORVASC) 10 MG tablet Take 10 mg by mouth daily.   07/13/2020   chlorhexidine (HIBICLENS) 4 % external liquid Apply topically daily as needed. To wash legs and groin area 120 mL 3 Past Month   chlorthalidone (HYGROTON) 25 MG tablet Take 25 mg by mouth daily.   07/13/2020   dorzolamide-timolol (COSOPT) 22.3-6.8 MG/ML ophthalmic solution Place 1 drop into the right eye 2 (two) times daily.      doxycycline (VIBRAMYCIN) 100 MG capsule Take 100 mg by mouth daily.   Past Month   fenofibrate 160 MG tablet Take 1 tablet by mouth daily.   Past Week   fexofenadine (ALLEGRA) 180 MG tablet Take 180 mg by mouth daily.   Past Week   fluticasone (FLONASE) 50 MCG/ACT nasal spray Place 2 sprays into both nostrils daily as needed.   07/13/2020   glimepiride (AMARYL) 1 MG tablet Take 1 mg by mouth daily.   Past Week   Hydrocortisone-Aloe 1 % CREA Apply 1 application topically in the morning and at bedtime. To itchy area on legs   Past Month   ketoconazole (NIZORAL) 2 % cream Apply to rash in groin once a day as needed for flares. 60 g 5 Past Month    lidocaine (LMX) 4 % cream Apply 1 application topically as needed.      lovastatin (MEVACOR) 10 MG tablet Take 10 mg by mouth daily.   07/12/2020   metFORMIN (GLUCOPHAGE-XR) 500 MG 24 hr tablet Take 500 mg by mouth 2 (two) times daily.   Past Week   metoprolol tartrate (LOPRESSOR) 50 MG tablet Take 50 mg by mouth 2 (two) times daily.   07/13/2020   naproxen sodium (ALEVE) 220 MG tablet Take 220 mg by mouth daily as needed.   Past Week   pioglitazone (ACTOS) 30 MG tablet Take 30 mg by mouth daily.   Past Week   ramipril (ALTACE) 10 MG capsule Take 10 mg by mouth 2 (two) times daily.   07/13/2020   Semaglutide 7 MG TABS Take 1 tablet by mouth daily.   Past Week   spironolactone (ALDACTONE) 50 MG tablet Take 50 mg by mouth daily.   Past Week   STEGLATRO 15 MG TABS tablet Take 15 mg by mouth daily.   Past Week   terazosin (HYTRIN) 10 MG capsule Take 10 mg by mouth daily.    07/12/2020   traZODone (DESYREL) 150 MG tablet Take 150 mg by mouth at bedtime.   Past Week   trolamine salicylate (ASPERCREME) 10 % cream Apply 1 application topically as needed for muscle pain.   Past  Month   vitamin B-12 (CYANOCOBALAMIN) 1000 MCG tablet Take 1,000 mcg by mouth daily.   Past Week   mupirocin ointment (BACTROBAN) 2 % Apply 1 application topically 3 (three) times daily. 22 g 0    mupirocin ointment (BACTROBAN) 2 % Apply 1 application topically 2 (two) times daily. 22 g 1    mupirocin ointment (BACTROBAN) 2 % Apply 1 application topically 2 (two) times daily. 22 g 3    pseudoephedrine-guaifenesin (MUCINEX D) 60-600 MG 12 hr tablet Take 1 tablet by mouth every 12 (twelve) hours.        Allergies  Allergen Reactions   Ampicillin Diarrhea   Bactrim [Sulfamethoxazole-Trimethoprim] Hives   Cephalexin    Claritin [Loratadine] Other (See Comments)    Irritates throat   Penicillins Rash     Past Medical History:  Diagnosis Date   Arthritis    Cellulitis and abscess of left leg    Depression    Diabetes  mellitus without complication (HCC)    Hearing loss    History of IBS    HLD (hyperlipidemia)    Hypertension    IgA deficiency (HCC)    Morbid obesity (HCC)    Sleep apnea     Review of systems:  Otherwise negative.    Physical Exam  Gen: Alert, oriented. Appears stated age.  HEENT: PERRLA. Lungs: No respiratory distress CV: RRR Abd: soft, benign, no masses Ext: No edema    Planned procedures: Proceed with colonoscopy. The patient understands the nature of the planned procedure, indications, risks, alternatives and potential complications including but not limited to bleeding, infection, perforation, damage to internal organs and possible oversedation/side effects from anesthesia. The patient agrees and gives consent to proceed.  Please refer to procedure notes for findings, recommendations and patient disposition/instructions.     Patrick Duncan [facility] 07/13/2020  11:41 AM

## 2020-07-13 NOTE — Anesthesia Preprocedure Evaluation (Signed)
Anesthesia Evaluation  Patient identified by MRN, date of birth, ID band Patient awake    Reviewed: Allergy & Precautions, H&P , NPO status , Patient's Chart, lab work & pertinent test results, reviewed documented beta blocker date and time   History of Anesthesia Complications Negative for: history of anesthetic complications  Airway Mallampati: III  TM Distance: >3 FB Neck ROM: full  Mouth opening: Limited Mouth Opening  Dental  (+) Dental Advidsory Given, Teeth Intact, Missing   Pulmonary neg shortness of breath, sleep apnea and Continuous Positive Airway Pressure Ventilation , neg COPD, neg recent URI,    Pulmonary exam normal breath sounds clear to auscultation       Cardiovascular Exercise Tolerance: Good hypertension, (-) angina(-) Past MI and (-) Cardiac Stents Normal cardiovascular exam(-) dysrhythmias (-) Valvular Problems/Murmurs Rhythm:regular Rate:Normal     Neuro/Psych PSYCHIATRIC DISORDERS Depression negative neurological ROS     GI/Hepatic Neg liver ROS, GERD  ,  Endo/Other  diabetesMorbid obesity  Renal/GU negative Renal ROS  negative genitourinary   Musculoskeletal   Abdominal   Peds  Hematology negative hematology ROS (+)   Anesthesia Other Findings Past Medical History: No date: Arthritis No date: Cellulitis and abscess of left leg No date: Depression No date: Diabetes mellitus without complication (HCC) No date: Hearing loss No date: History of IBS No date: HLD (hyperlipidemia) No date: Hypertension 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[place]) No date: Morbid obesity ([place]) No date: Sleep apnea   Reproductive/Obstetrics negative OB ROS                             Anesthesia Physical Anesthesia Plan  ASA: 3  Anesthesia Plan: General   Post-op Pain Management:    Induction: Intravenous  PONV Risk Score and Plan: 2 and TIVA and Propofol infusion  Airway Management  Planned: Natural Airway and Nasal Cannula  Additional Equipment:   Intra-op Plan:   Post-operative Plan:   Informed Consent: I have reviewed the patients History and Physical, chart, labs and discussed the procedure including the risks, benefits and alternatives for the proposed anesthesia with the patient or authorized representative who has indicated his/her understanding and acceptance.     Dental Advisory Given  Plan Discussed with: Anesthesiologist, CRNA and Surgeon  Anesthesia Plan Comments:         Anesthesia Quick Evaluation

## 2020-07-14 ENCOUNTER — Encounter: Payer: Self-pay | Admitting: Gastroenterology

## 2020-07-14 ENCOUNTER — Other Ambulatory Visit: Payer: Self-pay | Admitting: Gastroenterology

## 2020-12-06 ENCOUNTER — Other Ambulatory Visit: Payer: Self-pay | Admitting: Dermatology

## 2020-12-06 DIAGNOSIS — L03115 Cellulitis of right lower limb: Secondary | ICD-10-CM

## 2020-12-14 ENCOUNTER — Emergency Department: Payer: Medicare HMO

## 2020-12-14 ENCOUNTER — Other Ambulatory Visit: Payer: Self-pay

## 2020-12-14 ENCOUNTER — Encounter: Payer: Self-pay | Admitting: Intensive Care

## 2020-12-14 ENCOUNTER — Emergency Department
Admission: EM | Admit: 2020-12-14 | Discharge: 2020-12-14 | Disposition: A | Discharge status: Home / Self Care | Payer: Medicare HMO | Attending: Emergency Medicine | Admitting: Emergency Medicine

## 2020-12-14 DIAGNOSIS — D72819 Decreased white blood cell count, unspecified: Secondary | ICD-10-CM | POA: Diagnosis not present

## 2020-12-14 DIAGNOSIS — Z7984 Long term (current) use of oral hypoglycemic drugs: Secondary | ICD-10-CM | POA: Diagnosis not present

## 2020-12-14 DIAGNOSIS — E1165 Type 2 diabetes mellitus with hyperglycemia: Secondary | ICD-10-CM | POA: Diagnosis present

## 2020-12-14 DIAGNOSIS — Z79899 Other long term (current) drug therapy: Secondary | ICD-10-CM | POA: Diagnosis not present

## 2020-12-14 DIAGNOSIS — I10 Essential (primary) hypertension: Secondary | ICD-10-CM | POA: Diagnosis not present

## 2020-12-14 DIAGNOSIS — D649 Anemia, unspecified: Secondary | ICD-10-CM | POA: Insufficient documentation

## 2020-12-14 DIAGNOSIS — R739 Hyperglycemia, unspecified: Secondary | ICD-10-CM

## 2020-12-14 LAB — CBG MONITORING, ED
Glucose-Capillary: 430 mg/dL — ABNORMAL HIGH (ref 70–99)
Glucose-Capillary: 325 mg/dL — ABNORMAL HIGH (ref 70–99)

## 2020-12-14 LAB — CBC
HCT: 23.5 % — ABNORMAL LOW (ref 39.0–52.0)
Hemoglobin: 8.2 g/dL — ABNORMAL LOW (ref 13.0–17.0)
MCH: 33.6 pg (ref 26.0–34.0)
MCHC: 34.9 g/dL (ref 30.0–36.0)
MCV: 96.3 fL (ref 80.0–100.0)
Platelets: 321 10*3/uL (ref 150–400)
RBC: 2.44 MIL/uL — ABNORMAL LOW (ref 4.22–5.81)
RDW: 16.1 % — ABNORMAL HIGH (ref 11.5–15.5)
WBC: 2.7 10*3/uL — ABNORMAL LOW (ref 4.0–10.5)
nRBC: 1.1 % — ABNORMAL HIGH (ref 0.0–0.2)

## 2020-12-14 LAB — URINALYSIS, ROUTINE W REFLEX MICROSCOPIC
Bilirubin Urine: NEGATIVE
Glucose, UA: >1000 mg/dL — AB
Hgb urine dipstick: NEGATIVE
Ketones, ur: NEGATIVE mg/dL
Leukocytes,Ua: NEGATIVE
Nitrite: NEGATIVE
Protein, ur: NEGATIVE mg/dL
Specific Gravity, Urine: <1.005 — ABNORMAL LOW (ref 1.005–1.030)
pH: 5 (ref 5.0–8.0)

## 2020-12-14 LAB — TROPONIN I (HIGH SENSITIVITY): Troponin I (High Sensitivity): 6 ng/L (ref <18)

## 2020-12-14 LAB — BASIC METABOLIC PANEL
Anion gap: 11 (ref 5–15)
BUN: 40 mg/dL — ABNORMAL HIGH (ref 6–20)
CO2: 23 mmol/L (ref 22–32)
Calcium: 8.9 mg/dL (ref 8.9–10.3)
Chloride: 93 mmol/L — ABNORMAL LOW (ref 98–111)
Creatinine, Ser: 1.23 mg/dL (ref 0.61–1.24)
GFR, Estimated: >60 mL/min (ref >60)
Glucose, Bld: 417 mg/dL — ABNORMAL HIGH (ref 70–99)
Potassium: 4.2 mmol/L (ref 3.5–5.1)
Sodium: 127 mmol/L — ABNORMAL LOW (ref 135–145)

## 2020-12-14 NOTE — Discharge Instructions (Addendum)
Your red blood cell count also known as the hemoglobin was 8.2 today which is lower than it has been in the past.  It is not at the level that you would need a transfusion however this is something you should discussed with your doctor as you may need to have another colonoscopy or further work-up for why you are anemic.  Your white blood cell count was also mildly low which is noticed leukopenia.  This is also something you should follow-up with your primary care provider about.  Your blood glucose was elevated today but you were not found to be in diabetic ketoacidosis.  Please take your diabetes medicines as prescribed.  Continue to check your blood sugar daily and please follow-up with your endocrinologist as you may need to have your medication adjusted.  Please also try to limit your carbohydrate intake and attempt to control the hyperglycemia.

## 2020-12-14 NOTE — ED Triage Notes (Signed)
Patient c/o sob and hyperglycemia. Patient stated his blood sugar was over 600 at home. Takes metformin and fluid pill daily. C/o dry mouth.

## 2020-12-14 NOTE — ED Notes (Signed)
Pt waving this RN over upset because he hasn't eaten since this morning, wanting to know when he can eat and how much longer its going to be, pt telling this RN that "someone needs to get off their butt and get him back to a room" pt offered peanut butter or for his wife to go downstairs to get him protein as to not eat carbs and raise his blood sugar further Pt requesting water and peanut butter

## 2020-12-14 NOTE — ED Provider Notes (Signed)
[facility]  ____________________________________________   Event Date/Time   First MD Initiated Contact with Patient 12/14/20 1639     (approximate)  I have reviewed the triage vital signs and the nursing notes.   HISTORY  Chief Complaint Hyperglycemia and Shortness of Breath    HPI Patrick Duncan is a 55 y.o. male with past medical history of type 2 diabetes on metformin, obesity, hypertension, hyperlipidemia who presents with an elevated blood sugar and shortness of breath.  Patient has not checked his blood sugar consistently and then over the weekend started checking it and it was significantly elevated in the 500s.  He notes that he has had dry mouth and increased urination and feeling somewhat fatigued.  He also notes more chronic exertional dyspnea but denies chest pain, palpitations or syncope.  He denies nausea, vomiting abdominal pain.  Denies fevers or chills.  He denies dysuria.  She is on multiple oral antihyperglycemic's including metformin, acro Prose, glimepiride and semaglutide.  He is compliant with this.  He does follow with an endocrinologist is not on insulin.  Prior to this weekend he is not routinely checking his blood sugars.         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[place])    Morbid obesity ([place])    Sleep apnea     Patient Active Problem List   Diagnosis Date Noted   Cellulitis 10/22/2016   Diabetes ([place]) 10/22/2016   HTN (hypertension) 10/22/2016   HLD (hyperlipidemia) 10/22/2016   OSA (obstructive sleep apnea) 10/22/2016   AKI (acute kidney injury) ([place]) 10/22/2016    Past Surgical History:  Procedure Laterality Date   COLONOSCOPY WITH PROPOFOL N/A 07/13/2020   Procedure: COLONOSCOPY WITH PROPOFOL;   Surgeon: Lesly Rubenstein, MD;  Location: [facility] ENDOSCOPY;  Service: Endoscopy;  Laterality: N/A;   TOOTH EXTRACTION      Prior to Admission medications   Medication Sig Start Date End Date Taking? Authorizing Provider  acarbose (PRECOSE) 25 MG tablet Take 25 mg by mouth 3 (three) times daily with meals.    [provider]  acetaminophen (TYLENOL) 650 MG CR tablet Take 650 mg by mouth every 8 (eight) hours as needed for pain.    [provider]  amLODipine (NORVASC) 10 MG tablet Take 10 mg by mouth daily. 05/18/16   [provider]  chlorthalidone (HYGROTON) 25 MG tablet Take 25 mg by mouth daily. 05/18/16   [provider]  dorzolamide-timolol (COSOPT) 22.3-6.8 MG/ML ophthalmic solution Place 1 drop into the right eye 2 (two) times daily.    [provider]  doxycycline (VIBRAMYCIN) 100 MG capsule Take 100 mg by mouth daily.    [provider]  fenofibrate 160 MG tablet Take 1 tablet by mouth daily. 07/25/17   [provider]  fexofenadine (ALLEGRA) 180 MG tablet Take 180 mg by mouth daily.    [provider]  fluticasone (FLONASE) 50 MCG/ACT nasal spray Place 2 sprays into both nostrils daily as needed. 05/04/16   [provider]  glimepiride (AMARYL) 1 MG tablet Take 1 mg by mouth daily. 09/22/16   [provider]  HIBICLENS 4 % external liquid APPLY TOPICALLY DAILY AS NEEDED.  TO [place] AREA 12/06/20   Ralene Bathe, MD  Hydrocortisone-Aloe 1 % CREA Apply 1 application topically in the morning and at bedtime. To itchy area on legs    [provider]  ketoconazole (NIZORAL) 2 % cream Apply to rash in groin once a day as needed for flares. 09/11/19   Ralene Bathe, MD  lidocaine (LMX) 4 % cream Apply 1 application topically as needed.    [provider]  lovastatin (MEVACOR) 10 MG tablet Take 10 mg by mouth daily. 08/12/19   [provider]  metFORMIN (GLUCOPHAGE-XR)  500 MG 24 hr tablet Take 500 mg by mouth 2 (two) times daily. 07/31/16   [provider]  metoprolol tartrate (LOPRESSOR) 50 MG tablet Take 50 mg by mouth 2 (two) times daily. 08/23/16   [provider]  mupirocin ointment (BACTROBAN) 2 % Apply 1 application topically 3 (three) times daily. 03/14/18   Lorin Picket, PA-C  mupirocin ointment (BACTROBAN) 2 % Apply 1 application topically 2 (two) times daily. 03/16/20   Ralene Bathe, MD  mupirocin ointment (BACTROBAN) 2 % Apply 1 application topically 2 (two) times daily. 06/07/20   Ralene Bathe, MD  naproxen sodium (ALEVE) 220 MG tablet Take 220 mg by mouth daily as needed.    [provider]  pioglitazone (ACTOS) 30 MG tablet Take 30 mg by mouth daily. 07/27/16   [provider]  pseudoephedrine-guaifenesin (MUCINEX D) 60-600 MG 12 hr tablet Take 1 tablet by mouth every 12 (twelve) hours.    [provider]  ramipril (ALTACE) 10 MG capsule Take 10 mg by mouth 2 (two) times daily. 05/18/16   [provider]  Semaglutide 7 MG TABS Take 1 tablet by mouth daily. 02/04/18   [provider]  spironolactone (ALDACTONE) 50 MG tablet Take 50 mg by mouth daily.    [provider]  STEGLATRO 15 MG TABS tablet Take 15 mg by mouth daily. 08/25/19   [provider]  terazosin (HYTRIN) 10 MG capsule Take 10 mg by mouth daily.  05/18/16   [provider]  traZODone (DESYREL) 150 MG tablet Take 150 mg by mouth at bedtime. 09/06/16   [provider]  trolamine salicylate (ASPERCREME) 10 % cream Apply 1 application topically as needed for muscle pain.    [provider]  vitamin B-12 (CYANOCOBALAMIN) 1000 MCG tablet Take 1,000 mcg by mouth daily.    [provider]    Allergies Ampicillin, Bactrim [sulfamethoxazole-trimethoprim], Cephalexin, Claritin [loratadine], and Penicillins  Family History  Problem Relation Age of Onset   Benign prostatic  hyperplasia Father    Psoriasis Father    Hypertension Mother    Hyperlipidemia Mother    Diabetes Maternal Grandmother    Diabetes Paternal Grandmother     Social History Social History   Tobacco Use   Smoking status: Never   Smokeless tobacco: Never  Vaping Use   Vaping Use: Never used  Substance Use Topics   Alcohol use: No   Drug use: No    Review of Systems   Review of Systems  Constitutional:  Negative for chills and fever.  Respiratory:  Positive for shortness of breath. Negative for cough.   Cardiovascular:  Negative for chest pain.  Gastrointestinal:  Negative for abdominal pain, nausea and vomiting.  Endocrine: Positive for polydipsia and polyuria.  Genitourinary:  Negative for dysuria.  All other systems reviewed and are negative.  Physical Exam Updated Vital Signs BP Marland Kitchen)  119/53 (BP Location: Right Arm)   Pulse 94   Temp 98 F (36.7 C) (Oral)   Resp 18   Ht 5\' 7"  (1.702 m)   Wt (!) 158.3 kg   SpO2 96%   BMI 54.66 kg/m   Physical Exam Vitals and nursing note reviewed.  Constitutional:      General: He is not in acute distress.    Appearance: Normal appearance. He is obese.  HENT:     Head: Normocephalic and atraumatic.  Eyes:     General: No scleral icterus.    Conjunctiva/sclera: Conjunctivae normal.  Cardiovascular:     Rate and Rhythm: Normal rate and regular rhythm.  Pulmonary:     Effort: Pulmonary effort is normal. No respiratory distress.     Breath sounds: Normal breath sounds. No wheezing.  Abdominal:     Palpations: Abdomen is soft. There is no mass.  Musculoskeletal:        General: No deformity or signs of injury.     Cervical back: Normal range of motion.  Skin:    General: Skin is warm.     Coloration: Skin is not jaundiced or pale.     Comments: Small wound on the left inner thigh, no surrounding cellulitis or drainage  Neurological:     General: No focal deficit present.     Mental Status: He is alert and oriented to  person, place, and time. Mental status is at baseline.  Psychiatric:        Mood and Affect: Mood normal.        Behavior: Behavior normal.     LABS (all labs ordered are listed, but only abnormal results are displayed)  Labs Reviewed  BASIC METABOLIC PANEL - Abnormal; Notable for the following components:      Result Value   Sodium 127 (*)    Chloride 93 (*)    Glucose, Bld 417 (*)    BUN 40 (*)    All other components within normal limits  CBC - Abnormal; Notable for the following components:   WBC 2.7 (*)    RBC 2.44 (*)    Hemoglobin 8.2 (*)    HCT 23.5 (*)    RDW 16.1 (*)    nRBC 1.1 (*)    All other components within normal limits  URINALYSIS, ROUTINE W REFLEX MICROSCOPIC - Abnormal; Notable for the following components:   Specific Gravity, Urine <1.005 (*)    Glucose, UA >1,000 (*)    All other components within normal limits  CBG MONITORING, ED - Abnormal; Notable for the following components:   Glucose-Capillary 430 (*)    All other components within normal limits  CBG MONITORING, ED - Abnormal; Notable for the following components:   Glucose-Capillary 325 (*)    All other components within normal limits  CBG MONITORING, ED  TROPONIN I (HIGH SENSITIVITY)   ____________________________________________  EKG  Incomplete right bundle branch block, normal sinus rhythm, normal axis, no acute ischemic changes ____________________________________________  RADIOLOGY I, Madelin Headings, personally viewed and evaluated these images (plain radiographs) as part of my medical decision making, as well as reviewing the written report by the radiologist.  ED MD interpretation:  I reviewed the CXR which does not show any acute cardiopulmonary process      ____________________________________________   PROCEDURES  Procedure(s) performed (including Critical Care):  Procedures   ____________________________________________   INITIAL IMPRESSION / ASSESSMENT AND  PLAN / ED COURSE  55 year old male presents with both shortness  of breath and hyperglycemia.  Hyperglycemia was really the acute issue shortness of breath is been going on longer.  Blood sugars in the 400s.  Had been about 500 at home.  Notably patient does not really check his blood sugar consistently and overall knee the past several days when he is started checking it has been high at home.  Does endorse some polyuria and polydipsia.  He has no other focal infectious symptoms.  Patient is on multiple p.o. agents but not on insulin.  He does follow with an endocrinologist.  Labs obtained which do not show any evidence of DKA, normal anion gap and bicarb.  Blood sugar on repeat in the ED was around 300.  In terms of his shortness of breath, this seems to been more of a longstanding issue.  He is not hypoxic here is no infiltrate on x-ray negative troponin and reassuring EKG.  Low suspicion for PE given this is been going on for some time and he has no chest pain or abnormal vital signs.  Also low suspicion for ACS given his reassuring EKG negative troponin and again that this seems to have been more longstanding.  Of note patient's hemoglobin is 8.2 from a prior that was around 11.  He denies any melena or bright red blood per rectum or any other source of bleeding.  He is also mildly leukopenic with a white blood cell count of 2.7.  Platelets are normal.  I did recommend that he follow-up with his primary care provider given this new anemia.  Discussed endocrinology follow-up as well.  Patient is stable for discharge.  Clinical Course as of 12/15/20 0032  Tue Dec 14, 2020  1752 Troponin I (High Sensitivity): 6 [KM]    Clinical Course User Index [KM] Rada Hay, MD     ____________________________________________   FINAL CLINICAL IMPRESSION(S) / ED DIAGNOSES  Final diagnoses:  Hyperglycemia  Anemia, unspecified type  Leukopenia, unspecified type     ED Discharge Orders     None         Note:  This document was prepared using Dragon voice recognition software and may include unintentional dictation errors.    Rada Hay, MD 12/15/20 [id number]

## 2020-12-14 NOTE — ED Provider Notes (Signed)
  Emergency Medicine Provider Triage Evaluation Note  Patrick Duncan , a 55 y.o.male,  was evaluated in triage.  Pt complains of shortness of breath and hyperglycemia.  Patient states that he has been short of breath for the past few days, he noticed that his blood sugar was in the high 500s.  Denies chest pain, abdominal pain, nausea/vomiting, or fever   Review of Systems  Positive: SOB Negative: Denies fever, chest pain, vomiting  Physical Exam   Vitals:   12/14/20 1125  BP: (!) 105/47  Pulse: 85  Resp: 19  Temp: 99.2 F (37.3 C)  SpO2: 100%   Gen:   Awake, no distress   Resp:  Normal effort  MSK:   Moves extremities without difficulty  Other:    Medical Decision Making  Given the patient's initial medical screening exam, the following diagnostic evaluation has been ordered. The patient will be placed in the appropriate treatment space, once one is available, to complete the evaluation and treatment. I have discussed the plan of care with the patient and I have advised the patient that an ED physician or mid-level practitioner will reevaluate their condition after the test results have been received, as the results may give them additional insight into the type of treatment they may need.    Diagnostics: Labs, EKG, CXR  Treatments: none immediately   Teodoro Spray, [place] 12/14/20 1135    Vladimir Crofts, MD 12/14/20 1310

## 2020-12-21 ENCOUNTER — Other Ambulatory Visit: Payer: Self-pay

## 2020-12-21 ENCOUNTER — Ambulatory Visit: Payer: Medicare HMO | Admitting: Dermatology

## 2020-12-21 ENCOUNTER — Inpatient Hospital Stay
Admission: EM | Admit: 2020-12-21 | Discharge: 2020-12-23 | DRG: 394 | Disposition: A | Discharge status: Home / Self Care | Payer: Medicare HMO | Attending: Internal Medicine | Admitting: Internal Medicine

## 2020-12-21 DIAGNOSIS — Z20822 Contact with and (suspected) exposure to covid-19: Secondary | ICD-10-CM | POA: Diagnosis present

## 2020-12-21 DIAGNOSIS — E611 Iron deficiency: Secondary | ICD-10-CM | POA: Diagnosis present

## 2020-12-21 DIAGNOSIS — Z7984 Long term (current) use of oral hypoglycemic drugs: Secondary | ICD-10-CM

## 2020-12-21 DIAGNOSIS — K921 Melena: Secondary | ICD-10-CM | POA: Diagnosis present

## 2020-12-21 DIAGNOSIS — L97121 Non-pressure chronic ulcer of left thigh limited to breakdown of skin: Secondary | ICD-10-CM

## 2020-12-21 DIAGNOSIS — R350 Frequency of micturition: Secondary | ICD-10-CM | POA: Diagnosis present

## 2020-12-21 DIAGNOSIS — G4733 Obstructive sleep apnea (adult) (pediatric): Secondary | ICD-10-CM | POA: Diagnosis present

## 2020-12-21 DIAGNOSIS — E871 Hypo-osmolality and hyponatremia: Secondary | ICD-10-CM | POA: Diagnosis present

## 2020-12-21 DIAGNOSIS — D649 Anemia, unspecified: Secondary | ICD-10-CM | POA: Diagnosis not present

## 2020-12-21 DIAGNOSIS — D802 Selective deficiency of immunoglobulin A [IgA]: Secondary | ICD-10-CM | POA: Diagnosis present

## 2020-12-21 DIAGNOSIS — I129 Hypertensive chronic kidney disease with stage 1 through stage 4 chronic kidney disease, or unspecified chronic kidney disease: Secondary | ICD-10-CM | POA: Diagnosis present

## 2020-12-21 DIAGNOSIS — K922 Gastrointestinal hemorrhage, unspecified: Secondary | ICD-10-CM | POA: Diagnosis present

## 2020-12-21 DIAGNOSIS — T8089XA Other complications following infusion, transfusion and therapeutic injection, initial encounter: Secondary | ICD-10-CM | POA: Diagnosis not present

## 2020-12-21 DIAGNOSIS — I3139 Other pericardial effusion (noninflammatory): Secondary | ICD-10-CM | POA: Diagnosis present

## 2020-12-21 DIAGNOSIS — E1122 Type 2 diabetes mellitus with diabetic chronic kidney disease: Secondary | ICD-10-CM | POA: Diagnosis present

## 2020-12-21 DIAGNOSIS — I4510 Unspecified right bundle-branch block: Secondary | ICD-10-CM | POA: Diagnosis present

## 2020-12-21 DIAGNOSIS — K640 First degree hemorrhoids: Secondary | ICD-10-CM | POA: Diagnosis present

## 2020-12-21 DIAGNOSIS — D62 Acute posthemorrhagic anemia: Secondary | ICD-10-CM | POA: Diagnosis present

## 2020-12-21 DIAGNOSIS — L03116 Cellulitis of left lower limb: Secondary | ICD-10-CM | POA: Diagnosis not present

## 2020-12-21 DIAGNOSIS — N182 Chronic kidney disease, stage 2 (mild): Secondary | ICD-10-CM | POA: Diagnosis present

## 2020-12-21 DIAGNOSIS — L98491 Non-pressure chronic ulcer of skin of other sites limited to breakdown of skin: Secondary | ICD-10-CM

## 2020-12-21 DIAGNOSIS — Y848 Other medical procedures as the cause of abnormal reaction of the patient, or of later complication, without mention of misadventure at the time of the procedure: Secondary | ICD-10-CM | POA: Diagnosis not present

## 2020-12-21 DIAGNOSIS — Z880 Allergy status to penicillin: Secondary | ICD-10-CM | POA: Diagnosis not present

## 2020-12-21 DIAGNOSIS — Z8249 Family history of ischemic heart disease and other diseases of the circulatory system: Secondary | ICD-10-CM | POA: Diagnosis not present

## 2020-12-21 DIAGNOSIS — Z6841 Body Mass Index (BMI) 40.0 and over, adult: Secondary | ICD-10-CM | POA: Diagnosis not present

## 2020-12-21 DIAGNOSIS — Z833 Family history of diabetes mellitus: Secondary | ICD-10-CM

## 2020-12-21 DIAGNOSIS — N179 Acute kidney failure, unspecified: Secondary | ICD-10-CM | POA: Diagnosis present

## 2020-12-21 DIAGNOSIS — E785 Hyperlipidemia, unspecified: Secondary | ICD-10-CM | POA: Diagnosis present

## 2020-12-21 DIAGNOSIS — Z79899 Other long term (current) drug therapy: Secondary | ICD-10-CM

## 2020-12-21 LAB — URINALYSIS, ROUTINE W REFLEX MICROSCOPIC
Bacteria, UA: NONE SEEN
Bilirubin Urine: NEGATIVE
Glucose, UA: >=500 mg/dL — AB
Hgb urine dipstick: NEGATIVE
Ketones, ur: NEGATIVE mg/dL
Leukocytes,Ua: NEGATIVE
Nitrite: NEGATIVE
Protein, ur: NEGATIVE mg/dL
Specific Gravity, Urine: 1.012 (ref 1.005–1.030)
pH: 6 (ref 5.0–8.0)

## 2020-12-21 LAB — CBC
HCT: 20.5 % — ABNORMAL LOW (ref 39.0–52.0)
Hemoglobin: 7 g/dL — ABNORMAL LOW (ref 13.0–17.0)
MCH: 34 pg (ref 26.0–34.0)
MCHC: 34.1 g/dL (ref 30.0–36.0)
MCV: 99.5 fL (ref 80.0–100.0)
Platelets: 341 10*3/uL (ref 150–400)
RBC: 2.06 MIL/uL — ABNORMAL LOW (ref 4.22–5.81)
RDW: 16.8 % — ABNORMAL HIGH (ref 11.5–15.5)
WBC: 2.3 10*3/uL — ABNORMAL LOW (ref 4.0–10.5)
nRBC: 0 % (ref 0.0–0.2)

## 2020-12-21 LAB — RESP PANEL BY RT-PCR (FLU A&B, COVID) ARPGX2
Influenza A by PCR: NEGATIVE
Influenza B by PCR: NEGATIVE
SARS Coronavirus 2 by RT PCR: NEGATIVE

## 2020-12-21 LAB — IRON AND TIBC
Iron: 78 ug/dL (ref 45–182)
Saturation Ratios: 17 % — ABNORMAL LOW (ref 17.9–39.5)
TIBC: 452 ug/dL — ABNORMAL HIGH (ref 250–450)
UIBC: 374 ug/dL

## 2020-12-21 LAB — BASIC METABOLIC PANEL
Anion gap: 10 (ref 5–15)
BUN: 80 mg/dL — ABNORMAL HIGH (ref 6–20)
CO2: 23 mmol/L (ref 22–32)
Calcium: 9.1 mg/dL (ref 8.9–10.3)
Chloride: 93 mmol/L — ABNORMAL LOW (ref 98–111)
Creatinine, Ser: 1.85 mg/dL — ABNORMAL HIGH (ref 0.61–1.24)
GFR, Estimated: 43 mL/min — ABNORMAL LOW (ref >60)
Glucose, Bld: 338 mg/dL — ABNORMAL HIGH (ref 70–99)
Potassium: 4.3 mmol/L (ref 3.5–5.1)
Sodium: 126 mmol/L — ABNORMAL LOW (ref 135–145)

## 2020-12-21 LAB — HEMOGLOBIN AND HEMATOCRIT, BLOOD
HCT: 19.1 % — ABNORMAL LOW (ref 39.0–52.0)
Hemoglobin: 6.5 g/dL — ABNORMAL LOW (ref 13.0–17.0)

## 2020-12-21 LAB — SODIUM: Sodium: 129 mmol/L — ABNORMAL LOW (ref 135–145)

## 2020-12-21 LAB — CBG MONITORING, ED: Glucose-Capillary: 286 mg/dL — ABNORMAL HIGH (ref 70–99)

## 2020-12-21 LAB — FERRITIN: Ferritin: 242 ng/mL (ref 24–336)

## 2020-12-21 LAB — ABO/RH: ABO/RH(D): O POS

## 2020-12-21 MED ORDER — SODIUM CHLORIDE 0.9 % IV SOLN: INTRAVENOUS | Status: DC

## 2020-12-21 MED ORDER — PANTOPRAZOLE SODIUM 40 MG IV SOLR
40.0000 mg | Freq: Once | INTRAVENOUS | Status: AC
  Administered 2020-12-21: 40 mg via INTRAVENOUS
  Filled 2020-12-21: qty 40

## 2020-12-21 MED ORDER — INSULIN ASPART 100 UNIT/ML IJ SOLN
0.0000 [IU] | INTRAMUSCULAR | Status: DC
  Administered 2020-12-21: 3 [IU] via SUBCUTANEOUS
  Administered 2020-12-22 (×3): 2 [IU] via SUBCUTANEOUS
  Administered 2020-12-22: 3 [IU] via SUBCUTANEOUS
  Administered 2020-12-22 (×3): 2 [IU] via SUBCUTANEOUS
  Administered 2020-12-23 (×2): 1 [IU] via SUBCUTANEOUS
  Filled 2020-12-21 (×9): qty 1

## 2020-12-21 MED ORDER — FENOFIBRATE 160 MG PO TABS
160.0000 mg | ORAL_TABLET | Freq: Every day | ORAL | Status: DC
  Administered 2020-12-22 – 2020-12-23 (×2): 160 mg via ORAL
  Filled 2020-12-21 (×2): qty 1

## 2020-12-21 MED ORDER — PANTOPRAZOLE SODIUM 40 MG IV SOLR: 40.0000 mg | Freq: Two times a day (BID) | INTRAVENOUS | Status: DC

## 2020-12-21 MED ORDER — SODIUM CHLORIDE 0.9% IV SOLUTION: Freq: Once | INTRAVENOUS | Status: DC

## 2020-12-21 MED ORDER — ONDANSETRON HCL 4 MG/2ML IJ SOLN: 4.0000 mg | Freq: Four times a day (QID) | INTRAMUSCULAR | Status: DC | PRN

## 2020-12-21 MED ORDER — ONDANSETRON HCL 4 MG PO TABS: 4.0000 mg | ORAL_TABLET | Freq: Four times a day (QID) | ORAL | Status: DC | PRN

## 2020-12-21 MED ORDER — DOXYCYCLINE HYCLATE 100 MG PO TABS: ORAL_TABLET | ORAL | 2 refills | Status: DC

## 2020-12-21 MED ORDER — MUPIROCIN 2 % EX OINT: TOPICAL_OINTMENT | CUTANEOUS | 3 refills | Status: DC

## 2020-12-21 MED ORDER — BISACODYL 5 MG PO TBEC: 5.0000 mg | DELAYED_RELEASE_TABLET | Freq: Every day | ORAL | Status: DC | PRN

## 2020-12-21 MED ORDER — MORPHINE SULFATE (PF) 2 MG/ML IV SOLN: 1.0000 mg | INTRAVENOUS | Status: DC | PRN

## 2020-12-21 MED ORDER — OXYCODONE HCL 5 MG PO TABS: 5.0000 mg | ORAL_TABLET | ORAL | Status: DC | PRN

## 2020-12-21 MED ORDER — PRAVASTATIN SODIUM 20 MG PO TABS
10.0000 mg | ORAL_TABLET | Freq: Every day | ORAL | Status: DC
  Administered 2020-12-22: 18:00:00 10 mg via ORAL
  Filled 2020-12-21: qty 1

## 2020-12-21 NOTE — ED Provider Notes (Signed)
[facility] Emergency Department Provider Note  ____________________________________________   Event Date/Time   First MD Initiated Contact with Patient 12/21/20 1656     (approximate)  I have reviewed the triage vital signs and the nursing notes.   HISTORY  Chief Complaint Weakness    HPI Patrick Duncan is a 55 y.o. male with diabetes, hypertension, hyperlipidemia, obesity who comes in with concerns for drop in hemoglobin.  Patient was seen in the emergency room his hemoglobin was found to be 8 but he was denied any bleeding at that time.  Since that he has developed some bright red blood per rectum.  He followed up with his primary care doctor and they did a stool occult test that was positive for blood.  They repeated the CBC and had dropped to 7.6.  They are try to get him outpatient GI follow-up and they had labs drawn again this morning but it sounds like it had dropped further so he was told to go to the emergency room to be evaluated.  Patient does report some increasing weakness, fatigue, constant, nothing makes it better or worse.  He states that he has not had any rectal bleeding in the last 1 to 2 days.          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)    Morbid obesity ([place])    Sleep apnea     Patient Active Problem List   Diagnosis Date Noted   Cellulitis 10/22/2016   Diabetes ([place]) 10/22/2016   HTN (hypertension) 10/22/2016   HLD (hyperlipidemia) 10/22/2016   OSA (obstructive sleep apnea) 10/22/2016   AKI (acute kidney injury) ([place]) 10/22/2016    Past Surgical History:  Procedure Laterality Date   COLONOSCOPY WITH PROPOFOL N/A 07/13/2020   Procedure: COLONOSCOPY WITH PROPOFOL;  Surgeon: Lesly Rubenstein, MD;   Location: [facility] ENDOSCOPY;  Service: Endoscopy;  Laterality: N/A;   TOOTH EXTRACTION      Prior to Admission medications   Medication Sig Start Date End Date Taking? Authorizing Provider  acarbose (PRECOSE) 25 MG tablet Take 25 mg by mouth 3 (three) times daily with meals.    [provider]  acetaminophen (TYLENOL) 650 MG CR tablet Take 650 mg by mouth every 8 (eight) hours as needed for pain.    [provider]  amLODipine (NORVASC) 10 MG tablet Take 10 mg by mouth daily. 05/18/16   [provider]  chlorthalidone (HYGROTON) 25 MG tablet Take 25 mg by mouth daily. 05/18/16   [provider]  dorzolamide-timolol (COSOPT) 22.3-6.8 MG/ML ophthalmic solution Place 1 drop into the right eye 2 (two) times daily.    [provider]  doxycycline (VIBRA-TABS) 100 MG tablet For boils - take one tab po BID x 14 days. Take with food. 12/21/20   Ralene Bathe, MD  doxycycline (VIBRAMYCIN) 100 MG capsule Take 100 mg by mouth daily.    [provider]  FARXIGA 10 MG TABS tablet Take 10 mg by mouth daily. 12/13/20   [provider]  fenofibrate 160 MG tablet Take 1 tablet by mouth daily. 07/25/17   [provider]  fexofenadine (ALLEGRA) 180 MG tablet Take 180 mg by  mouth daily.    [provider]  fluticasone (FLONASE) 50 MCG/ACT nasal spray Place 2 sprays into both nostrils daily as needed. 05/04/16   [provider]  glimepiride (AMARYL) 1 MG tablet Take 1 mg by mouth daily. 09/22/16   [provider]  HIBICLENS 4 % external liquid APPLY TOPICALLY DAILY AS NEEDED. TO [place] AREA 12/06/20   Ralene Bathe, MD  Hydrocortisone-Aloe 1 % CREA Apply 1 application topically in the morning and at bedtime. To itchy area on legs    [provider]  ketoconazole (NIZORAL) 2 % cream Apply to rash in groin once a day as needed for flares. 09/11/19   Ralene Bathe, MD  lidocaine (LMX) 4 % cream Apply 1  application topically as needed.    [provider]  lovastatin (MEVACOR) 10 MG tablet Take 10 mg by mouth daily. 08/12/19   [provider]  metFORMIN (GLUCOPHAGE-XR) 500 MG 24 hr tablet Take 500 mg by mouth 2 (two) times daily. 07/31/16   [provider]  metoprolol tartrate (LOPRESSOR) 50 MG tablet Take 50 mg by mouth 2 (two) times daily. 08/23/16   [provider]  mupirocin ointment (BACTROBAN) 2 % Apply to boils and ulcers QD PRN flares. 12/21/20   Ralene Bathe, MD  naproxen sodium (ALEVE) 220 MG tablet Take 220 mg by mouth daily as needed.    [provider]  pioglitazone (ACTOS) 30 MG tablet Take 30 mg by mouth daily. 07/27/16   [provider]  pseudoephedrine-guaifenesin (MUCINEX D) 60-600 MG 12 hr tablet Take 1 tablet by mouth every 12 (twelve) hours. Patient not taking: Reported on 12/21/2020    [provider]  ramipril (ALTACE) 10 MG capsule Take 10 mg by mouth 2 (two) times daily. 05/18/16   [provider]  Semaglutide 7 MG TABS Take 1 tablet by mouth daily. 02/04/18   [provider]  spironolactone (ALDACTONE) 50 MG tablet Take 50 mg by mouth daily.    [provider]  STEGLATRO 15 MG TABS tablet Take 15 mg by mouth daily. 08/25/19   [provider]  terazosin (HYTRIN) 10 MG capsule Take 10 mg by mouth daily.  05/18/16   [provider]  traZODone (DESYREL) 150 MG tablet Take 150 mg by mouth at bedtime. 09/06/16   [provider]  TRESIBA FLEXTOUCH 200 UNIT/ML FlexTouch Pen Inject into the skin. 12/17/20   [provider]  trolamine salicylate (ASPERCREME) 10 % cream Apply 1 application topically as needed for muscle pain.    [provider]  vitamin B-12 (CYANOCOBALAMIN) 1000 MCG tablet Take 1,000 mcg by mouth daily.    [provider]    Allergies Ampicillin, Bactrim [sulfamethoxazole-trimethoprim], Cephalexin, Claritin [loratadine], and  Penicillins  Family History  Problem Relation Age of Onset   Benign prostatic hyperplasia Father    Psoriasis Father    Hypertension Mother    Hyperlipidemia Mother    Diabetes Maternal Grandmother    Diabetes Paternal Grandmother     Social History Social History   Tobacco Use   Smoking status: Never   Smokeless tobacco: Never  Vaping Use   Vaping Use: Never used  Substance Use Topics   Alcohol use: No   Drug use: No      Review of Systems Constitutional: No fever/chills weakness, fatigue Eyes: No visual changes. ENT: No sore throat. Cardiovascular: Denies chest pain. Respiratory: Denies shortness of breath. Gastrointestinal: No abdominal pain.  No  nausea, no vomiting.  No diarrhea.  No constipation. Genitourinary: Negative for dysuria. Musculoskeletal: Negative for back pain.  GI bleeding Skin: Negative for rash. Neurological: Negative for headaches, focal weakness or numbness. All other ROS negative ____________________________________________   PHYSICAL EXAM:  VITAL SIGNS: ED Triage Vitals  Enc Vitals Group     BP 12/21/20 1427 (!) 132/51     Pulse Rate 12/21/20 1427 86     Resp 12/21/20 1427 19     Temp 12/21/20 1427 97.7 F (36.5 C)     Temp Source 12/21/20 1427 Oral     SpO2 12/21/20 1427 97 %     Weight --      Height --      Head Circumference --      Peak Flow --      Pain Score 12/21/20 1422 0     Pain Loc --      Pain Edu? --      Excl. in [place]? --     Constitutional: Alert and oriented.  Elevated BMI eyes: Conjunctivae are normal. EOMI. Head: Atraumatic. Nose: No congestion/rhinnorhea. Mouth/Throat: Mucous membranes are moist.   Neck: No stridor. Trachea Midline. FROM Cardiovascular: Normal rate, regular rhythm. Grossly normal heart sounds.  Good peripheral circulation. Respiratory: Normal respiratory effort.  No retractions. Lungs CTAB. Gastrointestinal: Soft and nontender. No distention. No abdominal bruits.  Musculoskeletal:  Compression socks noted no joint effusions. Neurologic:  Normal speech and language. No gross focal neurologic deficits are appreciated.  Skin:  Skin is warm, dry and intact. No rash noted. Psychiatric: Mood and affect are normal. Speech and behavior are normal. GU: No stool test but no gross blood  ____________________________________________   LABS (all labs ordered are listed, but only abnormal results are displayed)  Labs Reviewed  BASIC METABOLIC PANEL - Abnormal; Notable for the following components:      Result Value   Sodium 126 (*)    Chloride 93 (*)    Glucose, Bld 338 (*)    BUN 80 (*)    Creatinine, Ser 1.85 (*)    GFR, Estimated 43 (*)    All other components within normal limits  CBC - Abnormal; Notable for the following components:   WBC 2.3 (*)    RBC 2.06 (*)    Hemoglobin 7.0 (*)    HCT 20.5 (*)    RDW 16.8 (*)    All other components within normal limits  URINALYSIS, ROUTINE W REFLEX MICROSCOPIC - Abnormal; Notable for the following components:   Color, Urine STRAW (*)    APPearance CLEAR (*)    Glucose, UA >=500 (*)    All other components within normal limits  IRON AND TIBC  FERRITIN  CBG MONITORING, ED  TYPE AND SCREEN  ABO/RH   ____________________________________________   ED ECG REPORT I, Vanessa Oxford, the attending physician, personally viewed and interpreted this ECG.  Normal sinus rate of 89, no ST elevation, no T wave versions except for V2, normal intervals except incomplete right bundle branch block ____________________________________________    PROCEDURES  Procedure(s) performed (including Critical Care):  .Critical Care Performed by: Vanessa Washburn, MD Authorized by: Vanessa , MD   Critical care provider statement:    Critical care time (minutes):  30   Critical care was necessary to treat or prevent imminent or life-threatening deterioration of the following conditions: symptomatic anemia.   Critical care was time  spent personally by me on the following activities:  Development of  treatment plan with patient or surrogate, discussions with consultants, evaluation of patient's response to treatment, examination of patient, ordering and review of laboratory studies, ordering and review of radiographic studies, ordering and performing treatments and interventions, pulse oximetry, re-evaluation of patient's condition and review of old charts   ____________________________________________   INITIAL IMPRESSION / ASSESSMENT AND PLAN / ED COURSE  Patrick Duncan was evaluated in Emergency Department on 12/21/2020 for the symptoms described in the history of present illness. He was evaluated in the context of the global COVID-19 pandemic, which necessitated consideration that the patient might be at risk for infection with the SARS-CoV-2 virus that causes COVID-19. Institutional protocols and algorithms that pertain to the evaluation of patients at risk for COVID-19 are in a state of rapid change based on information released by regulatory bodies including the CDC and federal and state organizations. These policies and algorithms were followed during the patient's care in the ED.    Patient comes in with lowering hemoglobin the setting of concern for GI bleed.  Sounds less likely to be upper GI bleed but will give a dose of Protonix.  On my rectal exam he had no stool to evaluate for melena but denies symptoms of melena.  I do not have any gross blood noted and he states that he had a bowel movement today that was normal.  His hemoglobin is downtrending to 7.  We discussed doing a blood transfusions for symptoms but patient has a lot of antibodies noted therefore will discuss further with the hospital team.  After discussion with hospitalist Given hemoglobin is downtrending at 7 we will start prepare and transfuse 1 unit.  Sugars are elevated but no evidence of DKA.  Will admit to the hospital team for further work-up  and management   ____________________________________________   FINAL CLINICAL IMPRESSION(S) / ED DIAGNOSES   Final diagnoses:  Symptomatic anemia      MEDICATIONS GIVEN DURING THIS VISIT:  Medications  pantoprazole (PROTONIX) injection 40 mg (has no administration in time range)  0.9 %  sodium chloride infusion (Manually program via Guardrails IV Fluids) (has no administration in time range)     ED Discharge Orders     None        Note:  This document was prepared using Dragon voice recognition software and may include unintentional dictation errors.    Vanessa Granjeno, MD 12/21/20 [phone]

## 2020-12-21 NOTE — ED Notes (Signed)
Received word from [facility] regarding pt's blood type. Pt has + antibodies screen and needs another lav tube.

## 2020-12-21 NOTE — Progress Notes (Signed)
   Follow-Up Visit   Subjective  Patrick Duncan is a 55 y.o. male who presents for the following: Skin lesion (Around thanksgiving patient did have a lesion come up on the back of his L post leg - he used Ketoconazole 2% cream but then lesion started oozing so he switched to Mupirocin 2% ointment. History of Recurrent Cellulitis and abscess of right lower extremity - Culture + for Staph aureus sensitive to Sulfa and Oxicillin (resistant to tetracycline); but pt has allergy to Sulfa and Penicillin and Cephalexin. ).  The following portions of the chart were reviewed this encounter and updated as appropriate:   Tobacco  Allergies  Meds  Problems  Med Hx  Surg Hx  Fam Hx     Review of Systems:  No other skin or systemic complaints except as noted in HPI or Assessment and Plan.  Objective  Well appearing patient in no apparent distress; mood and affect are within normal limits.  A focused examination was performed including the B/L lower legs. Relevant physical exam findings are noted in the Assessment and Plan.  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 Left Medial Thigh  Hx of drainage but doesn't appear to be infected today - Recurrent Cellulitis and abscess of right lower extremity - Culture + for Staph aureus sensitive to Sulfa and Oxicillin (resistant to tetracycline); but pt has allergy to Sulfa and Penicillin and Cephalexin. Currently calm today.  Continue Mupirocin 2% ointment to aa until healed. Will send in refills of Doxycycline 100mg  po BID x 10 days PRN recurrence.   doxycycline (VIBRA-TABS) 100 MG tablet - Left Medial Thigh For boils - take one tab po BID x 14 days. Take with food.  mupirocin ointment (BACTROBAN) 2 % - Left Medial Thigh Apply to boils and ulcers QD PRN flares.  Return in about 6 months (around 06/21/2021) for hx of cellulitis  .  Luther Redo, CMA, am acting as scribe for Sarina Ser, MD . Documentation: I have reviewed the above documentation for accuracy and completeness, and I agree with the above.  Sarina Ser, MD

## 2020-12-21 NOTE — ED Triage Notes (Signed)
Pt presents to ED with c/o of weakness. Pt states PCP sent pt here for "all my blood work dropping" Pt states he had a few instances of wiping after a BM and noticing bright red blood. Pt states weakness started a week ago. Pt states being seen recently for SOB and sent home. NAD noted at this time.

## 2020-12-21 NOTE — Patient Instructions (Signed)

## 2020-12-21 NOTE — ED Triage Notes (Signed)
Pt comes with c/o weakness and abnormal labs. Pt states his MD told him his hgb was dropping. Pt states he just wants to sleep. Pt denies any pain.  Pt states sugars have been elevated.

## 2020-12-21 NOTE — H&P (Signed)
History and Physical    CHANANYA CANIZALEZ NWG:[id number] DOB: 1965/10/24 DOA: 12/21/2020  PCP: Sofie Hartigan, MD Patient coming from: home    Chief Complaint: bright red blood per rectum   HPI: 55 y/o M w/ PMH of morbid obesity, DM2, HTN, HLD, OSA on CPAP, IgA deficiency who presents w/ bright red blood per rectum x 2 days. The last time the pt saw bright red blood per rectum was on 2 days ago on saturaday. Pt had seen his PCP and H&H continued to drop so pt was sent the ER. Of note, pt had a colonoscopy in June 2022 here and they did not find anything as per pt. Pt denies ever having a EGD. Pt does use NSAIDs but cannot tell me how often he takes NSAIDs. Pt denies any fever, chills, sweating, cough, chest pain, shortness of breath, nausea, vomiting, abd pain, dysuria, urinary urgency, urinary frequency, diarrhea or constipation.   Review of Systems: As per HPI otherwise 14 point review of systems negative.    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[place])    Morbid obesity ([place])    Sleep apnea     Past Surgical History:  Procedure Laterality Date   COLONOSCOPY WITH PROPOFOL N/A 07/13/2020   Procedure: COLONOSCOPY WITH PROPOFOL;  Surgeon: Lesly Rubenstein, MD;  Location: [facility] ENDOSCOPY;  Service: Endoscopy;  Laterality: N/A;   TOOTH EXTRACTION       reports that he has never smoked. He has never used smokeless tobacco. He reports that he does not drink alcohol and does not use drugs.  Allergies  Allergen Reactions   Ampicillin Diarrhea   Bactrim [Sulfamethoxazole-Trimethoprim] Hives   Cephalexin    Claritin [Loratadine] Other (See Comments)    Irritates throat   Penicillins Rash    Family History  Problem Relation Age of Onset   Benign prostatic hyperplasia  Father    Psoriasis Father    Hypertension Mother    Hyperlipidemia Mother    Diabetes Maternal Grandmother    Diabetes Paternal Grandmother     Prior to Admission medications   Medication Sig Start Date End Date Taking? Authorizing Provider  acarbose (PRECOSE) 25 MG tablet Take 25 mg by mouth 3 (three) times daily with meals.    [provider]  acetaminophen (TYLENOL) 650 MG CR tablet Take 650 mg by mouth every 8 (eight) hours as needed for pain.    [provider]  amLODipine (NORVASC) 10 MG tablet Take 10 mg by mouth daily. 05/18/16   [provider]  chlorthalidone (HYGROTON) 25 MG tablet Take 25 mg by mouth daily. 05/18/16   [provider]  dorzolamide-timolol (COSOPT) 22.3-6.8 MG/ML ophthalmic solution Place 1 drop into the right eye 2 (two) times daily.    [provider]  doxycycline (VIBRA-TABS) 100 MG tablet For boils - take one tab po BID x 14 days. Take with food. 12/21/20   Ralene Bathe, MD  doxycycline (VIBRAMYCIN) 100 MG capsule Take 100 mg by mouth daily.    [provider]  FARXIGA 10 MG TABS tablet Take 10 mg by mouth daily. 12/13/20   [provider]  fenofibrate 160 MG tablet Take 1 tablet by mouth  daily. 07/25/17   [provider]  fexofenadine (ALLEGRA) 180 MG tablet Take 180 mg by mouth daily.    [provider]  fluticasone (FLONASE) 50 MCG/ACT nasal spray Place 2 sprays into both nostrils daily as needed. 05/04/16   [provider]  glimepiride (AMARYL) 1 MG tablet Take 1 mg by mouth daily. 09/22/16   [provider]  HIBICLENS 4 % external liquid APPLY TOPICALLY DAILY AS NEEDED. TO [place] AREA 12/06/20   Ralene Bathe, MD  Hydrocortisone-Aloe 1 % CREA Apply 1 application topically in the morning and at bedtime. To itchy area on legs    [provider]  ketoconazole (NIZORAL) 2 % cream Apply to rash in groin once a day as needed for flares. 09/11/19    Ralene Bathe, MD  lidocaine (LMX) 4 % cream Apply 1 application topically as needed.    [provider]  lovastatin (MEVACOR) 10 MG tablet Take 10 mg by mouth daily. 08/12/19   [provider]  metFORMIN (GLUCOPHAGE-XR) 500 MG 24 hr tablet Take 500 mg by mouth 2 (two) times daily. 07/31/16   [provider]  metoprolol tartrate (LOPRESSOR) 50 MG tablet Take 50 mg by mouth 2 (two) times daily. 08/23/16   [provider]  mupirocin ointment (BACTROBAN) 2 % Apply to boils and ulcers QD PRN flares. 12/21/20   Ralene Bathe, MD  naproxen sodium (ALEVE) 220 MG tablet Take 220 mg by mouth daily as needed.    [provider]  pioglitazone (ACTOS) 30 MG tablet Take 30 mg by mouth daily. 07/27/16   [provider]  pseudoephedrine-guaifenesin (MUCINEX D) 60-600 MG 12 hr tablet Take 1 tablet by mouth every 12 (twelve) hours. Patient not taking: Reported on 12/21/2020    [provider]  ramipril (ALTACE) 10 MG capsule Take 10 mg by mouth 2 (two) times daily. 05/18/16   [provider]  Semaglutide 7 MG TABS Take 1 tablet by mouth daily. 02/04/18   [provider]  spironolactone (ALDACTONE) 50 MG tablet Take 50 mg by mouth daily.    [provider]  STEGLATRO 15 MG TABS tablet Take 15 mg by mouth daily. 08/25/19   [provider]  terazosin (HYTRIN) 10 MG capsule Take 10 mg by mouth daily.  05/18/16   [provider]  traZODone (DESYREL) 150 MG tablet Take 150 mg by mouth at bedtime. 09/06/16   [provider]  TRESIBA FLEXTOUCH 200 UNIT/ML FlexTouch Pen Inject into the skin. 12/17/20   [provider]  trolamine salicylate (ASPERCREME) 10 % cream Apply 1 application topically as needed for muscle pain.    [provider]  vitamin B-12 (CYANOCOBALAMIN) 1000 MCG tablet Take 1,000 mcg by mouth daily.    [provider]    Physical Exam: Vitals:   12/21/20 1427  BP: (!)  132/51  Pulse: 86  Resp: 19  Temp: 97.7 F (36.5 C)  TempSrc: Oral  SpO2: 97%    Constitutional: NAD, calm, comfortable. Morbidly obese Vitals:   12/21/20 1427  BP: (!) 132/51  Pulse: 86  Resp: 19  Temp: 97.7 F (36.5 C)  TempSrc: Oral  SpO2: 97%   Eyes: PERRL, lids and conjunctivae normal ENMT: Mucous membranes are moist. Neck: normal, supple Respiratory: dimished breath sounds b/l otherwise clear  Cardiovascular: S1/S2+, no rubs / gallops. Abdomen: soft, obese, no tenderness, Bowel sounds positive.  Musculoskeletal: no clubbing / cyanosis. No joint deformity upper and lower  extremities.  Skin: no rashes, lesions, ulcers.  Neurologic: CN 2-12 grossly intact. Moves all extremities  Psychiatric: Normal judgment and insight. Alert and oriented x 3. Normal mood.    Labs on Admission: I have personally reviewed following labs and imaging studies  CBC: Recent Labs  Lab 12/21/20 1435  WBC 2.3*  HGB 7.0*  HCT 20.5*  MCV 99.5  PLT 518   Basic Metabolic Panel: Recent Labs  Lab 12/21/20 1435  NA 126*  K 4.3  CL 93*  CO2 23  GLUCOSE 338*  BUN 80*  CREATININE 1.85*  CALCIUM 9.1   GFR: Estimated Creatinine Clearance: 67.3 mL/min (A) (by C-G formula based on SCr of 1.85 mg/dL (H)). Liver Function Tests: No results for input(s): AST, ALT, ALKPHOS, BILITOT, PROT, ALBUMIN in the last 168 hours. No results for input(s): LIPASE, AMYLASE in the last 168 hours. No results for input(s): AMMONIA in the last 168 hours. Coagulation Profile: No results for input(s): INR, PROTIME in the last 168 hours. Cardiac Enzymes: No results for input(s): CKTOTAL, CKMB, CKMBINDEX, TROPONINI in the last 168 hours. BNP (last 3 results) No results for input(s): PROBNP in the last 8760 hours. HbA1C: No results for input(s): HGBA1C in the last 72 hours. CBG: No results for input(s): GLUCAP in the last 168 hours. Lipid Profile: No results for input(s): CHOL, HDL, LDLCALC, TRIG, CHOLHDL,  LDLDIRECT in the last 72 hours. Thyroid Function Tests: No results for input(s): TSH, T4TOTAL, FREET4, T3FREE, THYROIDAB in the last 72 hours. Anemia Panel: No results for input(s): VITAMINB12, FOLATE, FERRITIN, TIBC, IRON, RETICCTPCT in the last 72 hours. Urine analysis:    Component Value Date/Time   COLORURINE STRAW (A) 12/21/2020 1435   APPEARANCEUR CLEAR (A) 12/21/2020 1435   LABSPEC 1.012 12/21/2020 1435   PHURINE 6.0 12/21/2020 1435   GLUCOSEU >=500 (A) 12/21/2020 1435   HGBUR NEGATIVE 12/21/2020 1435   BILIRUBINUR NEGATIVE 12/21/2020 1435   KETONESUR NEGATIVE 12/21/2020 1435   PROTEINUR NEGATIVE 12/21/2020 1435   NITRITE NEGATIVE 12/21/2020 1435   LEUKOCYTESUR NEGATIVE 12/21/2020 1435    Radiological Exams on Admission: No results found.  EKG: Independently reviewed.   Assessment/Plan Active Problems:   * No active hospital problems. * Possible GI bleed: bright red blood per rectum. Outpatient fecal occult was positive. H&H was dropping recently as outpatient & sent here by PCP. Uses NSAIDs at home but cannot say often he does. Continue on IV PPI. NPO. Will transfuse 1 unit of pRBCs as Hb 7.0. GI consulted, Dr. Bonna Gains   Likely acute blood loss anemia: likely secondary to above. Will transfuse 1 unit of pRBCs. Repeat H&H 4 hours post transfusion  AKI vs CKD: likely CKD. Unknown baseline Cr/GFR. Currently IIIa. Avoid nephrotoxic meds. Continue on IVFs  Hyponatremia: will start IVFs. Repeat Na level ordered. Will continue to monitor   IgA deficiency: dx when pt was 5 years.   HTN: hold home dose of amlodipine, chlorthalidone, ramipril, metoprolol, spironolactone secondary to possible GI bleed and concern for hypotension  OSA: continue on CPAP qhs   DM2: likely poorly controlled. HbA1c ordered. Will start SSI w/ accuchecks. Hold home dose of metformin, glimepiride, actos   Morbid obesity: BMI is >35. Complicates overall care and prognosis  HLD: will continue on  home dose of fenofibrate, statin    DVT prophylaxis: SCDs Code Status: full  Family Communication: discussed pt's care w/ pt's wife at bedside and answered her questions  Disposition Plan: unclear Consults called: GI, Dr. Bonna Gains  Admission  status: admit inpatient    Wyvonnia Dusky MD [facility]   If 7PM-7AM, please contact night-coverage   12/21/2020, 5:24 PM

## 2020-12-22 ENCOUNTER — Encounter: Payer: Self-pay | Admitting: Internal Medicine

## 2020-12-22 DIAGNOSIS — K922 Gastrointestinal hemorrhage, unspecified: Secondary | ICD-10-CM

## 2020-12-22 DIAGNOSIS — D649 Anemia, unspecified: Secondary | ICD-10-CM

## 2020-12-22 LAB — CBC
HCT: 18.7 % — ABNORMAL LOW (ref 39.0–52.0)
HCT: 21.6 % — ABNORMAL LOW (ref 39.0–52.0)
HCT: 24 % — ABNORMAL LOW (ref 39.0–52.0)
Hemoglobin: 6.3 g/dL — ABNORMAL LOW (ref 13.0–17.0)
Hemoglobin: 7.4 g/dL — ABNORMAL LOW (ref 13.0–17.0)
Hemoglobin: 8.3 g/dL — ABNORMAL LOW (ref 13.0–17.0)
MCH: 33.3 pg (ref 26.0–34.0)
MCH: 33 pg (ref 26.0–34.0)
MCH: 33.3 pg (ref 26.0–34.0)
MCHC: 33.7 g/dL (ref 30.0–36.0)
MCHC: 34.3 g/dL (ref 30.0–36.0)
MCHC: 34.6 g/dL (ref 30.0–36.0)
MCV: 98.9 fL (ref 80.0–100.0)
MCV: 96.4 fL (ref 80.0–100.0)
MCV: 96.4 fL (ref 80.0–100.0)
Platelets: 330 10*3/uL (ref 150–400)
Platelets: 306 10*3/uL (ref 150–400)
Platelets: 341 10*3/uL (ref 150–400)
RBC: 1.89 MIL/uL — ABNORMAL LOW (ref 4.22–5.81)
RBC: 2.24 MIL/uL — ABNORMAL LOW (ref 4.22–5.81)
RBC: 2.49 MIL/uL — ABNORMAL LOW (ref 4.22–5.81)
RDW: 17.1 % — ABNORMAL HIGH (ref 11.5–15.5)
RDW: 18 % — ABNORMAL HIGH (ref 11.5–15.5)
RDW: 18.1 % — ABNORMAL HIGH (ref 11.5–15.5)
WBC: 1.8 10*3/uL — ABNORMAL LOW (ref 4.0–10.5)
WBC: 3.8 10*3/uL — ABNORMAL LOW (ref 4.0–10.5)
WBC: 3.8 10*3/uL — ABNORMAL LOW (ref 4.0–10.5)
nRBC: 0 % (ref 0.0–0.2)
nRBC: 0.5 % — ABNORMAL HIGH (ref 0.0–0.2)
nRBC: 0.5 % — ABNORMAL HIGH (ref 0.0–0.2)

## 2020-12-22 LAB — URINALYSIS, COMPLETE (UACMP) WITH MICROSCOPIC
Bilirubin Urine: NEGATIVE
Glucose, UA: 500 mg/dL — AB
Hgb urine dipstick: NEGATIVE
Ketones, ur: 15 mg/dL — AB
Leukocytes,Ua: NEGATIVE
Nitrite: NEGATIVE
Protein, ur: NEGATIVE mg/dL
Specific Gravity, Urine: 1.015 (ref 1.005–1.030)
Squamous Epithelial / HPF: NONE SEEN (ref 0–5)
pH: 7 (ref 5.0–8.0)

## 2020-12-22 LAB — GLUCOSE, CAPILLARY
Glucose-Capillary: 209 mg/dL — ABNORMAL HIGH (ref 70–99)
Glucose-Capillary: 219 mg/dL — ABNORMAL HIGH (ref 70–99)
Glucose-Capillary: 214 mg/dL — ABNORMAL HIGH (ref 70–99)
Glucose-Capillary: 214 mg/dL — ABNORMAL HIGH (ref 70–99)
Glucose-Capillary: 226 mg/dL — ABNORMAL HIGH (ref 70–99)
Glucose-Capillary: 285 mg/dL — ABNORMAL HIGH (ref 70–99)
Glucose-Capillary: 224 mg/dL — ABNORMAL HIGH (ref 70–99)

## 2020-12-22 LAB — IRON AND TIBC
Iron: 78 ug/dL (ref 45–182)
Saturation Ratios: 17 % — ABNORMAL LOW (ref 17.9–39.5)
TIBC: 455 ug/dL — ABNORMAL HIGH (ref 250–450)
UIBC: 377 ug/dL

## 2020-12-22 LAB — BASIC METABOLIC PANEL
Anion gap: 8 (ref 5–15)
BUN: 70 mg/dL — ABNORMAL HIGH (ref 6–20)
CO2: 22 mmol/L (ref 22–32)
Calcium: 9 mg/dL (ref 8.9–10.3)
Chloride: 99 mmol/L (ref 98–111)
Creatinine, Ser: 1.38 mg/dL — ABNORMAL HIGH (ref 0.61–1.24)
GFR, Estimated: >60 mL/min (ref >60)
Glucose, Bld: 215 mg/dL — ABNORMAL HIGH (ref 70–99)
Potassium: 4.1 mmol/L (ref 3.5–5.1)
Sodium: 129 mmol/L — ABNORMAL LOW (ref 135–145)

## 2020-12-22 LAB — PREPARE RBC (CROSSMATCH)

## 2020-12-22 LAB — FOLATE: Folate: 21.4 ng/mL (ref >5.9)

## 2020-12-22 LAB — MAGNESIUM: Magnesium: 2.4 mg/dL (ref 1.7–2.4)

## 2020-12-22 LAB — VITAMIN B12: Vitamin B-12: 990 pg/mL — ABNORMAL HIGH (ref 180–914)

## 2020-12-22 LAB — HIV ANTIBODY (ROUTINE TESTING W REFLEX): HIV Screen 4th Generation wRfx: NONREACTIVE

## 2020-12-22 LAB — FERRITIN: Ferritin: 232 ng/mL (ref 24–336)

## 2020-12-22 MED ORDER — DIPHENHYDRAMINE HCL 50 MG/ML IJ SOLN
25.0000 mg | Freq: Once | INTRAMUSCULAR | Status: AC
  Administered 2020-12-22: 25 mg via INTRAVENOUS
  Filled 2020-12-22: qty 1

## 2020-12-22 MED ORDER — PEG 3350-KCL-NA BICARB-NACL 420 G PO SOLR
4000.0000 mL | Freq: Once | ORAL | Status: AC
  Administered 2020-12-22: 4000 mL via ORAL
  Filled 2020-12-22: qty 4000

## 2020-12-22 MED ORDER — SODIUM CHLORIDE 0.9 % IV BOLUS
500.0000 mL | Freq: Once | INTRAVENOUS | Status: AC
Start: 2020-12-22 — End: 2020-12-22
  Administered 2020-12-22: 500 mL via INTRAVENOUS

## 2020-12-22 MED ORDER — SODIUM CHLORIDE 0.9 % IV SOLN: INTRAVENOUS | Status: DC

## 2020-12-22 MED ORDER — SODIUM CHLORIDE 0.9% IV SOLUTION: Freq: Once | INTRAVENOUS | Status: AC

## 2020-12-22 MED ORDER — ACETAMINOPHEN 325 MG PO TABS
650.0000 mg | ORAL_TABLET | Freq: Once | ORAL | Status: AC
  Administered 2020-12-22: 650 mg via ORAL
  Filled 2020-12-22: qty 2

## 2020-12-22 MED ORDER — INSULIN GLARGINE-YFGN 100 UNIT/ML ~~LOC~~ SOLN
10.0000 [IU] | Freq: Every day | SUBCUTANEOUS | Status: DC
  Administered 2020-12-22 – 2020-12-23 (×2): 10 [IU] via SUBCUTANEOUS
  Filled 2020-12-22 (×3): qty 0.1

## 2020-12-22 NOTE — Progress Notes (Addendum)
Blood transfusion reaction and blood completed at 12:02pm. Blood products,IV tubing and saline transported to lab - returned @ 12:20pm.

## 2020-12-22 NOTE — Progress Notes (Signed)
Inpatient Diabetes Program Recommendations  AACE/ADA: New Consensus Statement on Inpatient Glycemic Control (2015)  Target Ranges:  Prepandial:   less than 140 mg/dL      Peak postprandial:   less than 180 mg/dL (1-2 hours)      Critically ill patients:  140 - 180 mg/dL   Lab Results  Component Value Date   GLUCAP 214 (H) 12/22/2020    Review of Glycemic Control  Latest Reference Range & Units 12/14/20 11:24 12/14/20 16:58 12/21/20 19:32 12/22/20 01:05 12/22/20 04:58 12/22/20 07:42 12/22/20 11:59  Glucose-Capillary 70 - 99 mg/dL 430 (H) 325 (H) 286 (H) 209 (H) 219 (H) 214 (H) 214 (H)  (H): Data is abnormally high Diabetes history: DM2 Outpatient Diabetes medications: Tresiba 30 units qd, Actos 30 mg qd, Farxiga 10 mg qd, Metformin 500 mg am, 1 gm q pm Current orders for Inpatient glycemic control: Novolog correction 0-6 units q 4 hrs.  Inpatient Diabetes Program Recommendations:   Noted patient is NPO. CBGs running > 180 range. Please consider: -Semglee 15 units qd Secure chat sent to Dr. Reesa Chew.  Thank you, Nani Gasser. Tilly Pernice, RN, MSN, CDE  Diabetes Coordinator Inpatient Glycemic Control Team Team Pager [phone] (8am-5pm) 12/22/2020 12:57 PM

## 2020-12-22 NOTE — Progress Notes (Signed)
   12/22/20 1300  Assess: MEWS Score  Temp 98.7 F (37.1 C)  BP (!) 98/44  Pulse Rate (!) 117  Resp 18  SpO2 95 %  O2 Device Room Air  Assess: MEWS Score  MEWS Temp 0  MEWS Systolic 1  MEWS Pulse 2  MEWS RR 0  MEWS LOC 0  MEWS Score 3  MEWS Score Color Yellow  Assess: if the MEWS score is Yellow or Red  Were vital signs taken at a resting state? Yes  Focused Assessment No change from prior assessment  Does the patient meet 2 or more of the SIRS criteria? No  MEWS guidelines implemented *See Row Information* Yes  Treat  MEWS Interventions Escalated (See documentation below)  Pain Scale 0-10  Pain Score 0  Assess: SIRS CRITERIA  SIRS Temperature  0  SIRS Pulse 1  SIRS Respirations  0  SIRS WBC 0  SIRS Score Sum  1

## 2020-12-22 NOTE — Plan of Care (Signed)

## 2020-12-22 NOTE — Progress Notes (Signed)
PROGRESS NOTE    Patrick Duncan  BEM:[id number] DOB: August 09, 1965 DOA: 12/21/2020 PCP: Sofie Hartigan, MD   Brief Narrative: Taken from H&P.  55 y/o M w/ PMH of morbid obesity, DM2, HTN, HLD, OSA on CPAP, IgA deficiency who presents w/ bright red blood per rectum x 2 days. The last time the pt saw bright red blood per rectum was on 2 days ago on saturaday. Pt had seen his PCP and H&H continued to drop so pt was sent the ER.  Apparently patient had a colonoscopy done in June 22 followed by cholangiography at [facility] due to incomplete prep, angiography done in September 2022 was negative for any abnormal findings.  Did show a small pericardial effusion and patient was seeing his cardiologist for that reason. Patient do take Aleve as needed for headaches and some arthritis pills from [place], unable to tell me the name couple of times daily.  Patient did not had any bleeding since Saturday, last bowel movement was yesterday and it was brown.  Patient denies any dark-colored stools.  No nausea or vomiting.  He denies any other recent illness.  Patient was also found to have warm antibodies in his blood during cross and match.  Initially 1 unit was dispatched, patient developed some shakiness and tachycardia after getting half of the unit.  Remained afebrile and no breathing difficulties.  Blood transfusion was discontinued and blood bank was notified, they did not find any mismatch on testing.  After discussing with medical director of blood transfusion we decided to proceed with washed blood due to his IgA deficiency.  His hemoglobin was 6.3 this morning and apparently he cannot get any procedure or anesthesia unless hemoglobin is above 7. GI also ask for cardiology clearance for his pericardial effusion which was noted during cholangiography at [facility] in September 2022-Dr. Nehemiah Massed was notified as he is Dr. Etta Quill patient.  Subjective: Patient was seen and examined twice, 1 during morning rounds  and 1 after getting blood transfusion reaction.  Denies any complaints.  Shakiness improved after stopping the blood.  He was feeling hungry and wants to eat something.  Has not noticed any bleeding per rectum since Saturday.  Denies any melena.  Last bowel movement was yesterday.  Assessment & Plan:   Principal Problem:   GI bleed  Acute blood loss anemia.  Patient did had bright blood per rectum, not sure about the amount that he noticed it twice, first on Friday and then second on Saturday.  Drop in hemoglobin seems disproportionate.  Both WBCs and hemoglobin is decreasing.  Platelets within normal limit.  Denies any acute illness as bone marrow suppression with some GI bleeding can be a possibility. History of all NSAIDs use but he denies any melena GI was consulted and they are planning to do an EGD and colonoscopy tomorrow but need cardiac clearance for a pericardial effusion noted on colonosgraphy in September 2022. Iron studies with iron and ferritin levels but decreased saturation and increased TIBC which is consistent with some iron deficiency, most likely acute with bleeding. -Please see blood transfusion reaction below. -We will give him a clear liquid diet, and most likely colonoscopy prep later today and n.p.o. after midnight. -Monitor hemoglobin and transfuse to keep above 7.  Blood transfusion reaction.  Patient with IgA deficiency and also found to have warm antibodies during crossmatch.  Received half a unit and developed some shakiness and tachycardia.  Blood transfusion was discontinued and sent back to blood bank, repeat  testing was negative for any mismatch.  Most likely secondary to his IgA deficiency.  After discussion with blood bank we will proceed with washed blood, ordered and should be coming from [place] later today. GI wants hemoglobin to be above 7 for any anesthesia. -Blood transfusion with 1 unit of washed blood. -Monitor for any adverse reaction.  AKI with CKD  stage II.  Most likely with GI bleed.  BUN and creatinine improving, baseline around 1.2, creatinine at 1.38 today. -Continue to monitor -Avoid nephrotoxins  Concern of pericardial effusion.  Patient had some exertional dyspnea which appears to be at his baseline.  Denies any chest pain or difficulty breathing. -Repeat echocardiogram -Cardiology consult for clearance  Mild hyponatremia.  Corrected sodium this morning was 131.  Patient was on chlorthalidone at home. -Continue to monitor  Hypertension.  Blood pressure within goal, did become little softer with transfusion reaction. -Keep holding home meds, he was on amlodipine, chlorthalidone, ramipril, metoprolol and spironolactone at home. -We will discontinue chlorthalidone for hyponatremia.  Hyperlipidemia. -Continue home dose of fenofibrate and statin.  Type 2 diabetes mellitus.  On multiple home medications.  CBG elevated. -Continue with SSI-we will titrate once started taking p.o. -Add 10 units of Semglee  IgA deficiency.  Diagnosed at the age of 62. -Should get washed blood products  Morbid obesity. Estimated body mass index is 54.66 kg/m as calculated from the following:   Height as of 12/14/20: 5\' 7"  (1.702 m).   Weight as of 12/14/20: 158.3 kg.   OSA. -Continue with CPAP at night and during sleep  Objective: Vitals:   12/22/20 0946 12/22/20 1042 12/22/20 1057 12/22/20 1205  BP: (!) 123/48 (!) 126/53 (!) 119/52 (!) 128/44  Pulse: (!) 103 89 89 (!) 126  Resp: 16 18 18 20   Temp: 98.2 F (36.8 C) 98.3 F (36.8 C) 97.6 F (36.4 C) 98.3 F (36.8 C)  TempSrc:  Oral Oral Oral  SpO2: 98% 100% 100% 100%    Intake/Output Summary (Last 24 hours) at 12/22/2020 1253 Last data filed at 12/22/2020 1202 Gross per 24 hour  Intake 842 ml  Output 200 ml  Net 642 ml   There were no vitals filed for this visit.  Examination:  General exam: Appears calm and comfortable  Respiratory system: Clear to auscultation.  Respiratory effort normal. Cardiovascular system: S1 & S2 heard, RRR.  Gastrointestinal system: Soft, obese belly, nontender, nondistended, bowel sounds positive. Central nervous system: Alert and oriented. No focal neurological deficits.Symmetric 5 x 5 power. Extremities: No edema, no cyanosis, pulses intact and symmetrical. Psychiatry: Judgement and insight appear normal.   DVT prophylaxis: SCDs Code Status: Full Family Communication: Discussed with wife at bedside Disposition Plan:  Status is: Inpatient  Remains inpatient appropriate because: Severity of illness   Level of care: Med-Surg  All the records are reviewed and case discussed with Care Management/Social Worker. Management plans discussed with the patient, nursing and they are in agreement.  Consultants:  GI Cardiology  Procedures:  Antimicrobials:   Data Reviewed: I have personally reviewed following labs and imaging studies  CBC: Recent Labs  Lab 12/21/20 1435 12/21/20 2316 12/22/20 0217  WBC 2.3*  --  1.8*  HGB 7.0* 6.5* 6.3*  HCT 20.5* 19.1* 18.7*  MCV 99.5  --  98.9  PLT 341  --  161   Basic Metabolic Panel: Recent Labs  Lab 12/21/20 1435 12/21/20 2316 12/22/20 0217  NA 126* 129* 129*  K 4.3  --  4.1  CL  93*  --  99  CO2 23  --  22  GLUCOSE 338*  --  215*  BUN 80*  --  70*  CREATININE 1.85*  --  1.38*  CALCIUM 9.1  --  9.0  MG  --   --  2.4   GFR: Estimated Creatinine Clearance: 90.2 mL/min (A) (by C-G formula based on SCr of 1.38 mg/dL (H)). Liver Function Tests: No results for input(s): AST, ALT, ALKPHOS, BILITOT, PROT, ALBUMIN in the last 168 hours. No results for input(s): LIPASE, AMYLASE in the last 168 hours. No results for input(s): AMMONIA in the last 168 hours. Coagulation Profile: No results for input(s): INR, PROTIME in the last 168 hours. Cardiac Enzymes: No results for input(s): CKTOTAL, CKMB, CKMBINDEX, TROPONINI in the last 168 hours. BNP (last 3 results) No results  for input(s): PROBNP in the last 8760 hours. HbA1C: No results for input(s): HGBA1C in the last 72 hours. CBG: Recent Labs  Lab 12/21/20 1932 12/22/20 0105 12/22/20 0458 12/22/20 0742 12/22/20 1159  GLUCAP 286* 209* 219* 214* 214*   Lipid Profile: No results for input(s): CHOL, HDL, LDLCALC, TRIG, CHOLHDL, LDLDIRECT in the last 72 hours. Thyroid Function Tests: No results for input(s): TSH, T4TOTAL, FREET4, T3FREE, THYROIDAB in the last 72 hours. Anemia Panel: Recent Labs    12/21/20 1435 12/22/20 0217  VITAMINB12  --  990*  FOLATE  --  21.4  FERRITIN 242 232  TIBC 452* 455*  IRON 78 78   Sepsis Labs: No results for input(s): PROCALCITON, LATICACIDVEN in the last 168 hours.  Recent Results (from the past 240 hour(s))  Resp Panel by RT-PCR (Flu A&B, Covid) Nasopharyngeal Swab     Status: None   Collection Time: 12/21/20  5:11 PM   Specimen: Nasopharyngeal Swab; Nasopharyngeal(NP) swabs in vial transport medium  Result Value Ref Range Status   SARS Coronavirus 2 by RT PCR NEGATIVE NEGATIVE Final    Comment: (NOTE) SARS-CoV-2 target nucleic acids are NOT DETECTED.  The SARS-CoV-2 RNA is generally detectable in upper respiratory specimens during the acute phase of infection. The lowest concentration of SARS-CoV-2 viral copies this assay can detect is 138 copies/mL. A negative result does not preclude SARS-Cov-2 infection and should not be used as the sole basis for treatment or other patient management decisions. A negative result may occur with  improper specimen collection/handling, submission of specimen other than nasopharyngeal swab, presence of viral mutation(s) within the areas targeted by this assay, and inadequate number of viral copies(<138 copies/mL). A negative result must be combined with clinical observations, patient history, and epidemiological information. The expected result is Negative.  Fact Sheet for Patients:   EntrepreneurPulse.com.au  Fact Sheet for Healthcare Providers:  IncredibleEmployment.be  This test is no t yet approved or cleared by the [place] FDA and  has been authorized for detection and/or diagnosis of SARS-CoV-2 by FDA under an Emergency Use Authorization (EUA). This EUA will remain  in effect (meaning this test can be used) for the duration of the COVID-19 declaration under Section 564(b)(1) of the Act, 21 U.S.C.section 360bbb-3(b)(1), unless the authorization is terminated  or revoked sooner.       Influenza A by PCR NEGATIVE NEGATIVE Final   Influenza B by PCR NEGATIVE NEGATIVE Final    Comment: (NOTE) The Xpert Xpress SARS-CoV-2/FLU/RSV plus assay is intended as an aid in the diagnosis of influenza from Nasopharyngeal swab specimens and should not be used as a sole basis for treatment. Nasal washings and aspirates  are unacceptable for Xpert Xpress SARS-CoV-2/FLU/RSV testing.  Fact Sheet for Patients: EntrepreneurPulse.com.au  Fact Sheet for Healthcare Providers: IncredibleEmployment.be  This test is not yet approved or cleared by the [place] FDA and has been authorized for detection and/or diagnosis of SARS-CoV-2 by FDA under an Emergency Use Authorization (EUA). This EUA will remain in effect (meaning this test can be used) for the duration of the COVID-19 declaration under Section 564(b)(1) of the Act, 21 U.S.C. section 360bbb-3(b)(1), unless the authorization is terminated or revoked.  Performed at [facility], [address], [place], [place] [address]      Radiology Studies: No results found.  Scheduled Meds:  sodium chloride   Intravenous Once   fenofibrate  160 mg Oral Daily   insulin aspart  0-6 Units Subcutaneous Q4H   [START ON 12/25/2020] pantoprazole  40 mg Intravenous Q12H   polyethylene glycol-electrolytes  4,000 mL Oral Once   pravastatin  10 mg  Oral q1800   Continuous Infusions:  sodium chloride 60 mL/hr at 12/22/20 0722   sodium chloride       LOS: 1 day   Time spent: 50 minutes. More than 50% of the time was spent in counseling/coordination of care  [name], MD [facility]  If 7PM-7AM, please contact night-coverage [email or web address]  12/22/2020, 12:53 PM   This record has been created using Systems analyst. Errors have been sought and corrected,but may not always be located. Such creation errors do not reflect on the standard of care.

## 2020-12-22 NOTE — Consult Note (Addendum)
[facility] Consultation Note  Patient ID: Patrick Duncan, MRN: [id number], DOB/AGE: 06-19-1965 55 y.o. Admit date: 12/21/2020   Date of Consult: 12/22/2020 Primary Physician: Sofie Hartigan, MD Primary Cardiologist: Dr. Lujean Amel   Chief Complaint:  Chief Complaint  Patient presents with   Weakness   Reason for Consult: medical optimization prior to colonoscopy on 12/23/2020.  HPI: 55 y.o. male with a past medical history of current posteriorpericardial effusion, chronic dyspnea on exertion, hypertension, hyperlipidemia, type 2 diabetes, obesity, obstructive sleep apnea, and IgA deficiency who presented to [facility] emergency department on 12/21/2020 with bright red blood per rectum.  Cardiology consulted for medical optimization prior to colonoscopy on 12/23/2020.  During interview pt states he currently feels slightly SOB at rest and gets winded when walking to the bathroom. He felt some heart racing earlier in the day with his tranfusion reaction and admits to some LEE in his left leg he states is chronic in nature from a prior cellulitis infection this year. He normally sleeps with 3 pillows underneath him at night but thinks he can lay flat for a procedure, provided he is mindful of his back. He denies any chest pain. He reports difficulty ambulating long distances due to a prior ankle injury in 2008.   He reports a history of wisdom teeth removal in 9983 without complication.  No family history of reactions to anesthesia.  He has never smoked tobacco and does not drink alcohol. He is allergic to ampicillin, bactrim, keflex, claritin, and penicillins.   He reports history of moderate pericardial effusion seen on CT colonography 09/2020.  Subsequent echocardiogram 10/2020 revealed LVEF greater than 55% and a moderate pericardial effusion.  Echocardiogram 11/22/2020 revealed LVEF greater than 55% and a small pericardial effusion with trivial MR and TR and the plan was to  perform serial echocardiograms and diuretic therapy which patient has been tolerating well. He denies prior history of stress testing or prior cardiac catheterization.   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[place])    Morbid obesity ([place])    Sleep apnea       Surgical History:  Past Surgical History:  Procedure Laterality Date   COLONOSCOPY WITH PROPOFOL N/A 07/13/2020   Procedure: COLONOSCOPY WITH PROPOFOL;  Surgeon: Lesly Rubenstein, MD;  Location: [facility] ENDOSCOPY;  Service: Endoscopy;  Laterality: N/A;   TOOTH EXTRACTION       Home Meds: Prior to Admission medications   Medication Sig Start Date End Date Taking? Authorizing Provider  acarbose (PRECOSE) 25 MG tablet Take 25 mg by mouth 3 (three) times daily with meals.   Yes [provider]  acetaminophen (TYLENOL) 650 MG CR tablet Take 650 mg by mouth every 8 (eight) hours as needed for pain.   Yes [provider]  amLODipine (NORVASC) 10 MG tablet Take 10 mg by mouth daily. 05/18/16  Yes [provider]  chlorthalidone (HYGROTON) 25 MG tablet Take 25 mg by mouth daily. 05/18/16  Yes [provider]  dorzolamide-timolol (COSOPT) 22.3-6.8 MG/ML ophthalmic solution Place 1 drop into the right eye 2 (two) times daily.   Yes [provider]  FARXIGA 10 MG TABS tablet Take 10 mg by mouth daily. 12/13/20  Yes [provider]  fenofibrate 160 MG tablet Take 160 mg by mouth daily. 07/25/17  Yes [provider]  fexofenadine (ALLEGRA) 180 MG tablet Take 180 mg by mouth daily.   Yes [provider]  fluticasone (FLONASE) 50 MCG/ACT nasal spray Place 2 sprays into both nostrils daily as needed. 05/04/16  Yes [provider]  HIBICLENS 4 % external liquid APPLY  TOPICALLY DAILY AS NEEDED. TO [place] AREA 12/06/20  Yes Ralene Bathe, MD  Hydrocortisone-Aloe 1 % CREA Apply 1 application topically in the morning and at bedtime. To itchy area on legs   Yes [provider]  ketoconazole (NIZORAL) 2 % cream Apply to rash in groin once a day as needed for flares. 09/11/19  Yes Ralene Bathe, MD  lidocaine (LMX) 4 % cream Apply 1 application topically as needed.   Yes [provider]  lovastatin (MEVACOR) 10 MG tablet Take 10 mg by mouth daily. 08/12/19  Yes [provider]  metFORMIN (GLUCOPHAGE-XR) 500 MG 24 hr tablet Take 500-1,000 mg by mouth 2 (two) times daily. Takes 1 tablet with breakfast and 2 tablets with supper 07/31/16  Yes [provider]  metoprolol tartrate (LOPRESSOR) 50 MG tablet Take 50 mg by mouth 2 (two) times daily. 08/23/16  Yes [provider]  mupirocin ointment (BACTROBAN) 2 % Apply to boils and ulcers QD PRN flares. 12/21/20  Yes Ralene Bathe, MD  naproxen sodium (ALEVE) 220 MG tablet Take 220 mg by mouth daily as needed.   Yes [provider]  pioglitazone (ACTOS) 30 MG tablet Take 30 mg by mouth daily. 07/27/16  Yes [provider]  pseudoephedrine-guaifenesin (MUCINEX D) 60-600 MG 12 hr tablet Take 1 tablet by mouth every 12 (twelve) hours.   Yes [provider]  ramipril (ALTACE) 10 MG capsule Take 10 mg by mouth 2 (two) times daily. 05/18/16  Yes [provider]  spironolactone (ALDACTONE) 50 MG tablet Take 50 mg by mouth daily.   Yes [provider]  terazosin (HYTRIN) 10 MG capsule Take 10 mg by mouth daily.  05/18/16  Yes [provider]  torsemide (DEMADEX) 20 MG tablet Take 20 mg by mouth daily.   Yes [provider]  traZODone (DESYREL) 150 MG tablet Take 150 mg by mouth at bedtime. 09/06/16  Yes [provider]  TRESIBA FLEXTOUCH 200 UNIT/ML FlexTouch Pen Inject 30 Units into the skin in the morning.  12/17/20  Yes [provider]  trolamine salicylate (ASPERCREME) 10 % cream Apply 1 application topically as needed for muscle pain.   Yes [provider]  vitamin B-12 (CYANOCOBALAMIN) 1000 MCG tablet Take 1,000 mcg by mouth daily.   Yes [provider]  dorzolamide (TRUSOPT) 2 % ophthalmic solution SMARTSIG:In Eye(s) Patient not taking: Reported on 12/21/2020 12/18/20   [provider]  doxycycline (VIBRA-TABS) 100 MG tablet For boils - take one tab po BID x 14 days. Take with food. Patient not taking: Reported on 12/21/2020 12/21/20   Ralene Bathe, MD  glimepiride (AMARYL) 1 MG tablet Take 1 mg by mouth daily. Patient not taking: Reported on 12/21/2020 09/22/16   [provider]  Semaglutide 7 MG TABS Take 1 tablet by mouth daily. Patient not taking: Reported on 12/21/2020 02/04/18   [provider]  STEGLATRO 15 MG TABS tablet Take 15 mg by mouth daily. Patient not taking: Reported on 12/21/2020 08/25/19   [provider]    Inpatient Medications:  sodium chloride   Intravenous Once   fenofibrate  160 mg Oral Daily   insulin aspart  0-6 Units Subcutaneous Q4H   insulin glargine-yfgn  10 Units Subcutaneous Daily   [START ON 12/25/2020] pantoprazole  40 mg Intravenous Q12H   polyethylene glycol-electrolytes  4,000 mL Oral Once   pravastatin  10 mg Oral q1800    sodium chloride 60 mL/hr at 12/22/20 0722   sodium chloride      Allergies:  Allergies  Allergen Reactions   Ampicillin Diarrhea   Bactrim [Sulfamethoxazole-Trimethoprim] Hives   Cephalexin    Claritin [Loratadine] Other (See Comments)    Irritates throat   Penicillins Rash    Social History   Socioeconomic History   Marital status: Married    Spouse name: Not on file   Number of children: Not on file   Years of education: Not on file   Highest education level: Not on file  Occupational History   Not on file  Tobacco Use   Smoking status: Never    Smokeless tobacco: Never  Vaping Use   Vaping Use: Never used  Substance and Sexual Activity   Alcohol use: No   Drug use: No   Sexual activity: Not on file  Other Topics Concern   Not on file  Social History Narrative   Not on file   Social Determinants of Health   Financial Resource Strain: Not on file  Food Insecurity: Not on file  Transportation Needs: Not on file  Physical Activity: Not on file  Stress: Not on file  Social Connections: Not on file  Intimate Partner Violence: Not on file     Family History  Problem Relation Age of Onset   Benign prostatic hyperplasia Father    Psoriasis Father    Hypertension Mother    Hyperlipidemia Mother    Diabetes Maternal Grandmother    Diabetes Paternal Grandmother      Review of Systems Positive for mild shortness of breath, orthopnea Negative for: General:  chills, fever, night sweats or weight changes.  Cardiovascular: PND orthopnea syncope dizziness  Dermatological skin lesions rashes Respiratory: Cough congestion Urologic: Frequent urination urination at night and hematuria Abdominal: negative for nausea, vomiting, diarrhea, bright red blood per rectum, melena, or hematemesis Neurologic: negative for visual changes, and/or hearing changes  All other systems reviewed and are otherwise negative except as noted above.  Labs: No results for input(s): CKTOTAL, CKMB, TROPONINI in the last 72 hours. Lab Results  Component Value Date   WBC 1.8 (L) 12/22/2020   HGB 6.3 (L) 12/22/2020   HCT 18.7 (L) 12/22/2020   MCV 98.9 12/22/2020   PLT 330 12/22/2020    Recent Labs  Lab 12/22/20 0217  NA 129*  K 4.1  CL 99  CO2 22  BUN 70*  CREATININE 1.38*  CALCIUM 9.0  GLUCOSE 215*   No results found for: CHOL, HDL, LDLCALC, TRIG No results found for: DDIMER  Radiology/Studies:  DG Chest 2 View  Result Date: 12/14/2020 CLINICAL DATA:  Short of breath and hyperglycemia EXAM: CHEST - 2 VIEW COMPARISON:  None. FINDINGS:  Normal mediastinum and cardiac silhouette. Normal pulmonary vasculature. No evidence of effusion, infiltrate, or pneumothorax. No acute bony abnormality. IMPRESSION: No acute cardiopulmonary process. Electronically Signed   By: Suzy Bouchard M.D.   On: 12/14/2020 12:20    EKG: Normal sinus rhythm with incomplete right bundle branch block,rate of 89.  Weights: There were no vitals filed for this visit.   Physical  Exam: Blood pressure (!) 98/44, pulse (!) 117, temperature 98.7 F (37.1 C), resp. rate 18, SpO2 95 %. There is no height or weight on file to calculate BMI. General: Pleasant obese Caucasian male, well nourished, in no acute distress sitting upright in hospital bed eating lunch.  Wife present at bedside Head eyes ears nose throat: Normocephalic, atraumatic, sclera non-icteric. Neck: supple, no apparent masses Lungs: Normal respiratory effort on room air.  no wheezes, no rales, no rhonchi.  Heart: Tachycardic rate and normal rhythm with easily ascultated S1 S2. no murmur gallop, no rub, jugular venous pressure is normal Abdomen: protuberant appearing.  Extremities: Left lower extremity with trace edema and some mild dermatitis on anterior shin and RLE without edema. no cyanosis, no clubbing, no ulcers  Peripheral : 2+ bilateral radial pulses,  2+ bilateral dorsal pedal pulse Neuro: Alert and oriented. Moves all extremities spontaneously. Musculoskeletal: Normal muscle tone without kyphosis Psych:  Responds to questions appropriately with a normal affect.    Assessment: 55 year old male past medical history of current posterior pericardial effusion, chronic dyspnea on exertion, hypertension, hyperlipidemia, type 2 diabetes, obesity, obstructive sleep apnea, and IgA deficiency who presented to [facility] emergency department on 12/21/2020 with bright red blood per rectum.  Cardiology consulted for medical optimization prior to colonoscopy on 12/23/2020.  Plan: #small pericardial effusion  by echocardiogram 11/22/2020 -Since the effusion appears to be small and improving without evidence of significant hemodynamic complications, no further testing required before colonoscopy.  -Patient is considered low risk for cardiac complications prior to procedure and his pending echocardiogram barrier to his colonoscopy tomorrow, per Dr. Nehemiah Massed.  Discussed in detail with patient and he is in agreement with this plan of care.  #Hypertension -current home regimen for blood pressure is Amlodipine 10 mg, metoprolol 50 mg BID, ramipril 10 mg, torsemide 20 mg once daily, and spironolactone which are currently being held after his transfusion reaction and soft BP. -can start back medications if blood pressure is outside goal  #hyperlipidemia -Continue fenofibrate and statin  #AKI on CKD -avoid nephrotoxic agents   Signed, Blain PA-C [facility] Cardiology 12/22/2020, 1:24 PM  The patient has been interviewed and examined. I agree with assessment and plan above. Serafina Royals MD [facility]

## 2020-12-22 NOTE — Consult Note (Signed)
Patrick Duncan , MD [address] [place], [place], [place], [address] [id number], [place], [place], [place], [address] Phone: [phone]  Fax: [phone]  Consultation  Referring Provider:     Dr Reesa Chew  Primary Care Physician:  Sofie Hartigan, MD Primary Gastroenterologist:  Dr. Haig Prophet         Reason for Consultation:     GI bleed  Date of Admission:  12/21/2020 Date of Consultation:  12/22/2020         HPI:   SHAFIQ LARCH is a 55 y.o. male he underwent a screening colonoscopy in June 2022 by Dr. Haig Prophet.  .  Note mentions that the scope was passed to the ascending colon before the procedure was aborted.  It was aborted due to difficulty of the procedure.  Quality of the preparation was good.Subsequently he underwent a CT colonography that showed the exam is limited due to body habitus but no polyps or masses identified within the colon.  Moderate-sized pericardial effusion was noted. No clear diverticulosis of the colon was noted either.   The patient presents to the hospital with bright red blood per rectum of 2 days duration.  Due to drop in hemoglobin the patient was sent to the ER.  No prior EGD.  In February 2020 hemoglobin is 11.3 g.  On admission hemoglobin was 7 g with an MCV of 99.5 and this morning hemoglobin of 6.3 g BUN was 70 creatinine of 1.38.  Denies any hematemesis.  He says he takes some arthritis medication but does not contested on a regular basis which is over-the-counter.  Unsure of which 1.  When I went to the room to speak to him he was having a transfusion reaction and was shivering.  Denies any bleeding at this point of time.  No abdominal pain at this point of time. 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[place])    Morbid obesity ([place])     Sleep apnea     Past Surgical History:  Procedure Laterality Date   COLONOSCOPY WITH PROPOFOL N/A 07/13/2020   Procedure: COLONOSCOPY WITH PROPOFOL;  Surgeon: Lesly Rubenstein, MD;  Location: [facility] ENDOSCOPY;  Service: Endoscopy;  Laterality: N/A;   TOOTH EXTRACTION      Prior to Admission medications   Medication Sig Start Date End Date Taking? Authorizing Provider  acarbose (PRECOSE) 25 MG tablet Take 25 mg by mouth 3 (three) times daily with meals.   Yes [provider]  acetaminophen (TYLENOL) 650 MG CR tablet Take 650 mg by mouth every 8 (eight) hours as needed for pain.   Yes [provider]  amLODipine (NORVASC) 10 MG tablet Take 10 mg by mouth daily. 05/18/16  Yes [provider]  chlorthalidone (HYGROTON) 25 MG tablet Take 25 mg by mouth daily. 05/18/16  Yes [provider]  dorzolamide-timolol (COSOPT) 22.3-6.8 MG/ML ophthalmic solution Place 1 drop into the right eye 2 (two) times daily.   Yes [provider]  FARXIGA 10 MG TABS tablet Take 10 mg by mouth daily. 12/13/20  Yes [provider]  fenofibrate 160 MG tablet Take 160 mg by mouth daily. 07/25/17  Yes [provider]  fexofenadine (ALLEGRA) 180  MG tablet Take 180 mg by mouth daily.   Yes [provider]  fluticasone (FLONASE) 50 MCG/ACT nasal spray Place 2 sprays into both nostrils daily as needed. 05/04/16  Yes [provider]  HIBICLENS 4 % external liquid APPLY TOPICALLY DAILY AS NEEDED. TO [place] AREA 12/06/20  Yes Ralene Bathe, MD  Hydrocortisone-Aloe 1 % CREA Apply 1 application topically in the morning and at bedtime. To itchy area on legs   Yes [provider]  ketoconazole (NIZORAL) 2 % cream Apply to rash in groin once a day as needed for flares. 09/11/19  Yes Ralene Bathe, MD  lidocaine (LMX) 4 % cream Apply 1 application topically as needed.   Yes [provider]  lovastatin (MEVACOR) 10 MG tablet  Take 10 mg by mouth daily. 08/12/19  Yes [provider]  metFORMIN (GLUCOPHAGE-XR) 500 MG 24 hr tablet Take 500-1,000 mg by mouth 2 (two) times daily. Takes 1 tablet with breakfast and 2 tablets with supper 07/31/16  Yes [provider]  metoprolol tartrate (LOPRESSOR) 50 MG tablet Take 50 mg by mouth 2 (two) times daily. 08/23/16  Yes [provider]  mupirocin ointment (BACTROBAN) 2 % Apply to boils and ulcers QD PRN flares. 12/21/20  Yes Ralene Bathe, MD  naproxen sodium (ALEVE) 220 MG tablet Take 220 mg by mouth daily as needed.   Yes [provider]  pioglitazone (ACTOS) 30 MG tablet Take 30 mg by mouth daily. 07/27/16  Yes [provider]  pseudoephedrine-guaifenesin (MUCINEX D) 60-600 MG 12 hr tablet Take 1 tablet by mouth every 12 (twelve) hours.   Yes [provider]  ramipril (ALTACE) 10 MG capsule Take 10 mg by mouth 2 (two) times daily. 05/18/16  Yes [provider]  spironolactone (ALDACTONE) 50 MG tablet Take 50 mg by mouth daily.   Yes [provider]  terazosin (HYTRIN) 10 MG capsule Take 10 mg by mouth daily.  05/18/16  Yes [provider]  torsemide (DEMADEX) 20 MG tablet Take 20 mg by mouth daily.   Yes [provider]  traZODone (DESYREL) 150 MG tablet Take 150 mg by mouth at bedtime. 09/06/16  Yes [provider]  TRESIBA FLEXTOUCH 200 UNIT/ML FlexTouch Pen Inject 30 Units into the skin in the morning. 12/17/20  Yes [provider]  trolamine salicylate (ASPERCREME) 10 % cream Apply 1 application topically as needed for muscle pain.   Yes [provider]  vitamin B-12 (CYANOCOBALAMIN) 1000 MCG tablet Take 1,000 mcg by mouth daily.   Yes [provider]  dorzolamide (TRUSOPT) 2 % ophthalmic solution SMARTSIG:In Eye(s) Patient not taking: Reported on 12/21/2020 12/18/20   [provider]  doxycycline (VIBRA-TABS) 100 MG tablet For boils - take one tab po  BID x 14 days. Take with food. Patient not taking: Reported on 12/21/2020 12/21/20   Ralene Bathe, MD  glimepiride (AMARYL) 1 MG tablet Take 1 mg by mouth daily. Patient not taking: Reported on 12/21/2020 09/22/16   [provider]  Semaglutide 7 MG TABS Take 1 tablet by mouth daily. Patient not taking: Reported on 12/21/2020 02/04/18   [provider]  STEGLATRO 15 MG TABS tablet Take 15 mg by mouth daily. Patient not taking: Reported on 12/21/2020 08/25/19   [provider]    Family History  Problem Relation Age of Onset   Benign prostatic hyperplasia Father    Psoriasis Father    Hypertension Mother  Hyperlipidemia Mother    Diabetes Maternal Grandmother    Diabetes Paternal Grandmother      Social History   Tobacco Use   Smoking status: Never   Smokeless tobacco: Never  Vaping Use   Vaping Use: Never used  Substance Use Topics   Alcohol use: No   Drug use: No    Allergies as of 12/21/2020 - Review Complete 12/21/2020  Allergen Reaction Noted   Ampicillin Diarrhea 10/22/2016   Bactrim [sulfamethoxazole-trimethoprim] Hives 07/12/2020   Cephalexin  05/19/2020   Claritin [loratadine] Other (See Comments) 10/22/2016   Penicillins Rash 07/12/2020    Review of Systems:    All systems reviewed and negative except where noted in HPI.   Physical Exam:  Vital signs in last 24 hours: Temp:  [97.6 F (36.4 C)-98.4 F (36.9 C)] 98.3 F (36.8 C) (12/07 1205) Pulse Rate:  [86-126] 126 (12/07 1205) Resp:  [16-20] 20 (12/07 1205) BP: (119-151)/(43-53) 128/44 (12/07 1205) SpO2:  [93 %-100 %] 100 % (12/07 1205) Last BM Date: 12/21/20 General:   Just had his blood transfusion stopped he was having a reaction shivering. Head:  Normocephalic and atraumatic. Eyes:   No icterus.   Conjunctiva pink. PERRLA. Ears:  Normal auditory acuity. Neck:  Supple; no masses or thyroidomegaly Lungs: Respirations even and unlabored. Lungs clear to auscultation  bilaterally.   No wheezes, crackles, or rhonchi.  Heart:  Regular rate and rhythm;  Without murmur, clicks, rubs or gallops Abdomen:  Soft, nondistended, nontender. Normal bowel sounds. No appreciable masses or hepatomegaly.  No rebound or guarding.  Neurologic:  Alert and oriented x3;  grossly normal neurologically. Skin:  Intact without significant lesions or rashes. Cervical Nodes:  No significant cervical adenopathy. Psych:  Alert and cooperative. Normal affect.  LAB RESULTS: Recent Labs    12/21/20 1435 12/21/20 2316 12/22/20 0217  WBC 2.3*  --  1.8*  HGB 7.0* 6.5* 6.3*  HCT 20.5* 19.1* 18.7*  PLT 341  --  330   BMET Recent Labs    12/21/20 1435 12/21/20 2316 12/22/20 0217  NA 126* 129* 129*  K 4.3  --  4.1  CL 93*  --  99  CO2 23  --  22  GLUCOSE 338*  --  215*  BUN 80*  --  70*  CREATININE 1.85*  --  1.38*  CALCIUM 9.1  --  9.0   LFT No results for input(s): PROT, ALBUMIN, AST, ALT, ALKPHOS, BILITOT, BILIDIR, IBILI in the last 72 hours. PT/INR No results for input(s): LABPROT, INR in the last 72 hours.  STUDIES: No results found.    Impression / Plan:   MARTI ACEBO is a 55 y.o. y/o male Presents to the emergency room with a 2-day history of rectal bleeding.  Patient had a colonoscopy in June 2022 where Dr. Haig Prophet could have decreased the ascending colon due to difficulty with the procedure could not reach the cecum.  This was followed by CT colonography both of which showed no diverticulosis of colon.  No internal hemorrhoids were noted on evaluation.  No polyps were resected.  No prior EGD.  On this admission hemoglobin dropped from a baseline of 11 g to 6 g.  No significant elevation in BUN/creatinine ratio.  The patient has multiple comorbidities including hypertension hyperlipidemia OSA on CPAP IgA deficiency.  Differentials include a small bowel bleed versus a diverticular bleed which was not initially documented versus AVMs in the ascending colon  which was not clearly visualized versus  possible NSAID related bleeding.  Plan 1. Monitor CBC and transfuse as needed 2.  Check iron studies and give IV iron if needed. 3.  EGD and colonoscopy tomorrow after hemoglobin levels are greater than 7 and the patient has had cardiac evaluation for the prior pericardial effusion seen on the CT scan of the abdomen back in 10/05/2020.  If EGD and colonoscopy is negative will need capsule study of the small bowel. 4.  In the interim if patient has active bleeding consider a tagged RBC scan or CT angiogram. 5.  Stop all NSAID use  I have discussed alternative options, risks & benefits,  which include, but are not limited to, bleeding, infection, perforation,respiratory complication & drug reaction.  The patient agrees with this plan & written consent will be obtained.     Thank you for involving me in the care of this patient.      LOS: 1 day   Patrick Bellows, MD  12/22/2020, 12:08 PM

## 2020-12-23 ENCOUNTER — Inpatient Hospital Stay: Payer: Medicare HMO | Admitting: Anesthesiology

## 2020-12-23 ENCOUNTER — Encounter
Admission: EM | Disposition: A | Discharge status: Home / Self Care | Payer: Self-pay | Source: Home / Self Care | Attending: Internal Medicine

## 2020-12-23 ENCOUNTER — Encounter: Payer: Self-pay | Admitting: Internal Medicine

## 2020-12-23 ENCOUNTER — Inpatient Hospital Stay (HOSPITAL_COMMUNITY)
Admit: 2020-12-23 | Discharge: 2020-12-23 | Disposition: A | Discharge status: Home / Self Care | Payer: Medicare HMO | Attending: Internal Medicine | Admitting: Internal Medicine

## 2020-12-23 DIAGNOSIS — D649 Anemia, unspecified: Secondary | ICD-10-CM | POA: Diagnosis not present

## 2020-12-23 DIAGNOSIS — I3139 Other pericardial effusion (noninflammatory): Secondary | ICD-10-CM | POA: Diagnosis not present

## 2020-12-23 HISTORY — PX: ESOPHAGOGASTRODUODENOSCOPY (EGD) WITH PROPOFOL: SHX5813

## 2020-12-23 HISTORY — PX: COLONOSCOPY WITH PROPOFOL: SHX5780

## 2020-12-23 LAB — ECHOCARDIOGRAM COMPLETE
AR max vel: 4.64 cm2
AV Area VTI: 4.99 cm2
AV Area mean vel: 3.99 cm2
AV Mean grad: 4 mmHg
AV Peak grad: 7.4 mmHg
Ao pk vel: 1.36 m/s
Area-P 1/2: 3.33 cm2
MV VTI: 3.76 cm2
S' Lateral: 3.5 cm

## 2020-12-23 LAB — BASIC METABOLIC PANEL
Anion gap: 7 (ref 5–15)
BUN: 35 mg/dL — ABNORMAL HIGH (ref 6–20)
CO2: 23 mmol/L (ref 22–32)
Calcium: 8.6 mg/dL — ABNORMAL LOW (ref 8.9–10.3)
Chloride: 105 mmol/L (ref 98–111)
Creatinine, Ser: 1.08 mg/dL (ref 0.61–1.24)
GFR, Estimated: >60 mL/min (ref >60)
Glucose, Bld: 191 mg/dL — ABNORMAL HIGH (ref 70–99)
Potassium: 4.1 mmol/L (ref 3.5–5.1)
Sodium: 135 mmol/L (ref 135–145)

## 2020-12-23 LAB — CBC
HCT: 21.9 % — ABNORMAL LOW (ref 39.0–52.0)
HCT: 25 % — ABNORMAL LOW (ref 39.0–52.0)
Hemoglobin: 7.5 g/dL — ABNORMAL LOW (ref 13.0–17.0)
Hemoglobin: 8.4 g/dL — ABNORMAL LOW (ref 13.0–17.0)
MCH: 33.2 pg (ref 26.0–34.0)
MCH: 32.2 pg (ref 26.0–34.0)
MCHC: 34.2 g/dL (ref 30.0–36.0)
MCHC: 33.6 g/dL (ref 30.0–36.0)
MCV: 96.9 fL (ref 80.0–100.0)
MCV: 95.8 fL (ref 80.0–100.0)
Platelets: 303 10*3/uL (ref 150–400)
Platelets: 335 10*3/uL (ref 150–400)
RBC: 2.26 MIL/uL — ABNORMAL LOW (ref 4.22–5.81)
RBC: 2.61 MIL/uL — ABNORMAL LOW (ref 4.22–5.81)
RDW: 19.3 % — ABNORMAL HIGH (ref 11.5–15.5)
RDW: 19 % — ABNORMAL HIGH (ref 11.5–15.5)
WBC: 1.6 10*3/uL — ABNORMAL LOW (ref 4.0–10.5)
WBC: 3.3 10*3/uL — ABNORMAL LOW (ref 4.0–10.5)
nRBC: 0 % (ref 0.0–0.2)
nRBC: 0 % (ref 0.0–0.2)

## 2020-12-23 LAB — GLUCOSE, CAPILLARY
Glucose-Capillary: 200 mg/dL — ABNORMAL HIGH (ref 70–99)
Glucose-Capillary: 169 mg/dL — ABNORMAL HIGH (ref 70–99)
Glucose-Capillary: 160 mg/dL — ABNORMAL HIGH (ref 70–99)
Glucose-Capillary: 191 mg/dL — ABNORMAL HIGH (ref 70–99)

## 2020-12-23 LAB — HEMOGLOBIN A1C
Hgb A1c MFr Bld: 9.4 % — ABNORMAL HIGH (ref 4.8–5.6)
Mean Plasma Glucose: 223 mg/dL

## 2020-12-23 SURGERY — COLONOSCOPY WITH PROPOFOL
Anesthesia: General

## 2020-12-23 MED ORDER — FERROUS SULFATE 325 (65 FE) MG PO TBEC: 325.0000 mg | DELAYED_RELEASE_TABLET | Freq: Two times a day (BID) | ORAL | 3 refills | Status: DC

## 2020-12-23 MED ORDER — SODIUM CHLORIDE 0.9 % IV SOLN
INTRAVENOUS | Status: DC
  Administered 2020-12-23: 1000 mL via INTRAVENOUS

## 2020-12-23 MED ORDER — PROPOFOL 500 MG/50ML IV EMUL
INTRAVENOUS | Status: DC | PRN
  Administered 2020-12-23: 150 ug/kg/min via INTRAVENOUS

## 2020-12-23 MED ORDER — FENTANYL CITRATE (PF) 100 MCG/2ML IJ SOLN
INTRAMUSCULAR | Status: DC | PRN
  Administered 2020-12-23: 25 ug via INTRAVENOUS
  Administered 2020-12-23: 50 ug via INTRAVENOUS
  Administered 2020-12-23: 25 ug via INTRAVENOUS

## 2020-12-23 MED ORDER — LIDOCAINE HCL (CARDIAC) PF 100 MG/5ML IV SOSY
PREFILLED_SYRINGE | INTRAVENOUS | Status: DC | PRN
  Administered 2020-12-23: 100 mg via INTRAVENOUS

## 2020-12-23 MED ORDER — PROPOFOL 500 MG/50ML IV EMUL
INTRAVENOUS | Status: AC
  Filled 2020-12-23: qty 50

## 2020-12-23 MED ORDER — LIDOCAINE HCL (PF) 2 % IJ SOLN
INTRAMUSCULAR | Status: AC
  Filled 2020-12-23: qty 5

## 2020-12-23 MED ORDER — FENTANYL CITRATE (PF) 100 MCG/2ML IJ SOLN
INTRAMUSCULAR | Status: AC
  Filled 2020-12-23: qty 2

## 2020-12-23 NOTE — Progress Notes (Signed)
Attempted hand off to receiving floor. RN unavailable

## 2020-12-23 NOTE — Plan of Care (Signed)
Problem: Education: Goal: Knowledge of General Education information will improve Description: Including pain rating scale, medication(s)/side effects and non-pharmacologic comfort measures 12/23/2020 1500 by Trula Slade, RN Outcome: Adequate for Discharge 12/23/2020 [phone] by Trula Slade, RN Outcome: Progressing 12/23/2020 0828 by Trula Slade, RN Outcome: Progressing   Problem: Health Behavior/Discharge Planning: Goal: Ability to manage health-related needs will improve 12/23/2020 1500 by Trula Slade, RN Outcome: Adequate for Discharge 12/23/2020 [phone] by Trula Slade, RN Outcome: Progressing 12/23/2020 0828 by Trula Slade, RN Outcome: Progressing   Problem: Clinical Measurements: Goal: Ability to maintain clinical measurements within normal limits will improve 12/23/2020 1500 by Trula Slade, RN Outcome: Adequate for Discharge 12/23/2020 [phone] by Trula Slade, RN Outcome: Progressing 12/23/2020 0828 by Trula Slade, RN Outcome: Progressing Goal: Will remain free from infection 12/23/2020 1500 by Trula Slade, RN Outcome: Adequate for Discharge 12/23/2020 [phone] by Trula Slade, RN Outcome: Progressing 12/23/2020 0828 by Trula Slade, RN Outcome: Progressing Goal: Diagnostic test results will improve 12/23/2020 1500 by Trula Slade, RN Outcome: Adequate for Discharge 12/23/2020 [phone] by Trula Slade, RN Outcome: Progressing 12/23/2020 0828 by Trula Slade, RN Outcome: Progressing Goal: Respiratory complications will improve 12/23/2020 1500 by Trula Slade, RN Outcome: Adequate for Discharge 12/23/2020 [phone] by Trula Slade, RN Outcome: Progressing 12/23/2020 0828 by Trula Slade, RN Outcome: Progressing Goal: Cardiovascular complication will be avoided 12/23/2020 1500 by Trula Slade, RN Outcome: Adequate for Discharge 12/23/2020 [phone] by Trula Slade, RN Outcome: Progressing 12/23/2020  0828 by Trula Slade, RN Outcome: Progressing   Problem: Activity: Goal: Risk for activity intolerance will decrease 12/23/2020 1500 by Trula Slade, RN Outcome: Adequate for Discharge 12/23/2020 [phone] by Trula Slade, RN Outcome: Progressing 12/23/2020 0828 by Trula Slade, RN Outcome: Progressing   Problem: Nutrition: Goal: Adequate nutrition will be maintained 12/23/2020 1500 by Trula Slade, RN Outcome: Adequate for Discharge 12/23/2020 [phone] by Trula Slade, RN Outcome: Progressing 12/23/2020 0828 by Trula Slade, RN Outcome: Progressing   Problem: Coping: Goal: Level of anxiety will decrease 12/23/2020 1500 by Trula Slade, RN Outcome: Adequate for Discharge 12/23/2020 [phone] by Trula Slade, RN Outcome: Progressing 12/23/2020 0828 by Trula Slade, RN Outcome: Progressing   Problem: Elimination: Goal: Will not experience complications related to bowel motility 12/23/2020 1500 by Trula Slade, RN Outcome: Adequate for Discharge 12/23/2020 [phone] by Trula Slade, RN Outcome: Progressing 12/23/2020 0828 by Trula Slade, RN Outcome: Progressing Goal: Will not experience complications related to urinary retention 12/23/2020 1500 by Trula Slade, RN Outcome: Adequate for Discharge 12/23/2020 [phone] by Trula Slade, RN Outcome: Progressing 12/23/2020 0828 by Trula Slade, RN Outcome: Progressing   Problem: Pain Managment: Goal: General experience of comfort will improve 12/23/2020 1500 by Trula Slade, RN Outcome: Adequate for Discharge 12/23/2020 [phone] by Trula Slade, RN Outcome: Progressing 12/23/2020 0828 by Trula Slade, RN Outcome: Progressing   Problem: Safety: Goal: Ability to remain free from injury will improve 12/23/2020 1500 by Trula Slade, RN Outcome: Adequate for Discharge 12/23/2020 [phone] by Trula Slade, RN Outcome: Progressing 12/23/2020 0828 by Trula Slade,  RN Outcome: Progressing   Problem: Skin Integrity: Goal: Risk for impaired skin integrity will decrease 12/23/2020 1500 by Trula Slade, RN Outcome: Adequate for Discharge 12/23/2020 0838 by Trula Slade, RN Outcome: Progressing 12/23/2020 0828 by Trula Slade, RN  Outcome: Progressing   

## 2020-12-23 NOTE — Discharge Summary (Signed)
Physician Discharge Summary  Patrick Duncan DTO:[id number] DOB: August 21, 1965 DOA: 12/21/2020  PCP: Sofie Hartigan, MD  Admit date: 12/21/2020 Discharge date: 12/23/2020  Admitted From: Home Disposition: Home  Recommendations for Outpatient Follow-up:  Follow up with PCP in 1-2 weeks Follow-up with gastroenterology in 1 to 2 weeks Follow-up with your cardiologist according to their recommendations Please obtain BMP/CBC in one week Please follow up on the following pending results: None  Home Health: No Equipment/Devices: None Discharge Condition: Stable CODE STATUS: Full Diet recommendation: Heart Healthy / Carb Modified   Brief/Interim Summary:  55 y/o M w/ PMH of morbid obesity, DM2, HTN, HLD, OSA on CPAP, IgA deficiency who presents w/ bright red blood per rectum x 2 days. The last time the pt saw bright red blood per rectum was on 2 days ago on saturaday. Pt had seen his PCP and H&H continued to drop so pt was sent the ER.  Apparently patient had a colonoscopy done in June 22 followed by cholangiography at [facility] due to incomplete prep, cholangiography done in September 2022 was negative for any abnormal findings. Patient was found to have hemoglobin of 6.5 with elevated BUN and creatinine on admission.  He was admitted for GI bleed.  He was also found to have warm antibodies in his blood during cross and match.  Initially 1 unit was dispatched, patient developed some shakiness and tachycardia after getting half of the unit.  Remained afebrile and no breathing difficulties.  Blood transfusion was discontinued and blood bank was notified, they did not find any mismatch on testing.  After discussing with medical director of blood transfusion we decided to proceed with washed blood due to his IgA deficiency.  Patient received 1 unit of washed blood without any difficulty.  Hemoglobin in the morning was 7.5.  There was some fluctuation in his hemoglobin which seems disproportionate from his  concern of GI bleed.  There was also some leukopenia noted.  If his counts continue to get worse or remain persistently low then he will get benefit from a hematology evaluation. Because of the concern of IgA nephropathy he should be getting washed blood products in the future.  GI was consulted and after getting clearance from cardiology he underwent EGD and colonoscopy.  Did not had any more bleeding while in the hospital.  EGD was unremarkable.  Colonoscopy with some internal hemorrhoids which could be the reason of his bleeding per rectum.  He was discharged on iron supplement and will follow-up with his gastroenterologist as an outpatient.  He was noted to have some pericardial effusion during cholangiography at [facility], he was seeing his cardiologist for that reason.  Repeat echocardiogram with no changes, no sign of cardiac tamponade. Cardiology cleared him for anesthesia and he will continue follow-up with them as an outpatient for further management.  Patient was also found to have mild hyponatremia.  He was on chlorthalidone at home which was discontinued.  His PCP should be able to follow-up. He was also found to have an multiple medications for high blood pressure.  His blood pressure remained within normal limit without any antihypertensives.  He was advised to follow-up with his primary care provider and titrate his antihypertensives as needed.  Chlorthalidone was discontinued as mentioned above.  He was also on multiple medications for diabetes on his med list.  Not sure what he was ready taking.  His PCP should be able to review his med rec and made appropriate changes.  Patient also has  an history of obstructive sleep apnea and continue to use CPAP at night.  He was also found to have morbid obesity with BMI of 29.52 which will complicate his prognosis.  He was counseled for weight loss and continue to have counseling by PCP.  Patient will continue his home medications and need a close  follow-up with his PCP for med rec and further management.  Discharge Diagnoses:  Principal Problem:   GI bleed Active Problems:   Symptomatic anemia   Discharge Instructions  Discharge Instructions     Diet - low sodium heart healthy   Complete by: As directed    Discharge instructions   Complete by: As directed    It was pleasure taking care of you. You EGD and colonoscopy was without any obvious bleeding.  You do have some internal hemorrhoids which can cause similar presentation and painless bleeding as you experience before. Please follow-up with your gastroenterologist for further recommendations. You were on multiple medications for blood pressure, your blood pressure was within normal limit without any of them while in the hospital.  We discontinued chlorthalidone due to borderline low sodium. You need to discuss with your primary care doctor the need of keeping multiple antihypertensives. You are also being started on iron supplement. If your counts remains low, you need to see an hematologist for further recommendations. You need to have your levels checked earlier next week by your primary care doctor.   Increase activity slowly   Complete by: As directed       Allergies as of 12/23/2020       Reactions   Ampicillin Diarrhea   Bactrim [sulfamethoxazole-trimethoprim] Hives   Cephalexin    Claritin [loratadine] Other (See Comments)   Irritates throat   Penicillins Rash        Medication List     STOP taking these medications    chlorthalidone 25 MG tablet Commonly known as: HYGROTON   dorzolamide 2 % ophthalmic solution Commonly known as: TRUSOPT   doxycycline 100 MG tablet Commonly known as: VIBRA-TABS   glimepiride 1 MG tablet Commonly known as: AMARYL   naproxen sodium 220 MG tablet Commonly known as: ALEVE   pseudoephedrine-guaifenesin 60-600 MG 12 hr tablet Commonly known as: MUCINEX D   Semaglutide 7 MG Tabs   Steglatro 15 MG Tabs  tablet Generic drug: ertugliflozin L-PyroglutamicAc       TAKE these medications    acarbose 25 MG tablet Commonly known as: PRECOSE Take 25 mg by mouth 3 (three) times daily with meals.   acetaminophen 650 MG CR tablet Commonly known as: TYLENOL Take 650 mg by mouth every 8 (eight) hours as needed for pain.   amLODipine 10 MG tablet Commonly known as: NORVASC Take 10 mg by mouth daily.   dorzolamide-timolol 22.3-6.8 MG/ML ophthalmic solution Commonly known as: COSOPT Place 1 drop into the right eye 2 (two) times daily.   Farxiga 10 MG Tabs tablet Generic drug: dapagliflozin propanediol Take 10 mg by mouth daily.   fenofibrate 160 MG tablet Take 160 mg by mouth daily.   ferrous sulfate 325 (65 FE) MG EC tablet Take 1 tablet (325 mg total) by mouth 2 (two) times daily.   fexofenadine 180 MG tablet Commonly known as: ALLEGRA Take 180 mg by mouth daily.   fluticasone 50 MCG/ACT nasal spray Commonly known as: FLONASE Place 2 sprays into both nostrils daily as needed.   Hibiclens 4 % external liquid Generic drug: chlorhexidine APPLY TOPICALLY DAILY AS NEEDED. TO  WASHLEGS AND GROIN AREA   Hydrocortisone-Aloe 1 % Crea Apply 1 application topically in the morning and at bedtime. To itchy area on legs   ketoconazole 2 % cream Commonly known as: NIZORAL Apply to rash in groin once a day as needed for flares.   lidocaine 4 % cream Commonly known as: LMX Apply 1 application topically as needed.   lovastatin 10 MG tablet Commonly known as: MEVACOR Take 10 mg by mouth daily.   metFORMIN 500 MG 24 hr tablet Commonly known as: GLUCOPHAGE-XR Take 500-1,000 mg by mouth 2 (two) times daily. Takes 1 tablet with breakfast and 2 tablets with supper   metoprolol tartrate 50 MG tablet Commonly known as: LOPRESSOR Take 50 mg by mouth 2 (two) times daily.   mupirocin ointment 2 % Commonly known as: BACTROBAN Apply to boils and ulcers QD PRN flares.   pioglitazone 30 MG  tablet Commonly known as: ACTOS Take 30 mg by mouth daily.   ramipril 10 MG capsule Commonly known as: ALTACE Take 10 mg by mouth 2 (two) times daily.   spironolactone 50 MG tablet Commonly known as: ALDACTONE Take 50 mg by mouth daily.   terazosin 10 MG capsule Commonly known as: HYTRIN Take 10 mg by mouth daily.   torsemide 20 MG tablet Commonly known as: DEMADEX Take 20 mg by mouth daily.   traZODone 150 MG tablet Commonly known as: DESYREL Take 150 mg by mouth at bedtime.   Tyler Aas FlexTouch 200 UNIT/ML FlexTouch Pen Generic drug: insulin degludec Inject 30 Units into the skin in the morning.   trolamine salicylate 10 % cream Commonly known as: ASPERCREME Apply 1 application topically as needed for muscle pain.   vitamin B-12 1000 MCG tablet Commonly known as: CYANOCOBALAMIN Take 1,000 mcg by mouth daily.        Follow-up Information     Feldpausch, Chrissie Noa, MD. Schedule an appointment as soon as possible for a visit.   Specialty: Family Medicine Contact information: [address] [place] [id number] [phone]         Lesly Rubenstein, MD. Schedule an appointment as soon as possible for a visit in 1 week(s).   Specialty: Gastroenterology Contact information: [place] [address] [phone]                Allergies  Allergen Reactions   Ampicillin Diarrhea   Bactrim [Sulfamethoxazole-Trimethoprim] Hives   Cephalexin    Claritin [Loratadine] Other (See Comments)    Irritates throat   Penicillins Rash    Consultations: GI Cardiology  Procedures/Studies: DG Chest 2 View  Result Date: 12/14/2020 CLINICAL DATA:  Short of breath and hyperglycemia EXAM: CHEST - 2 VIEW COMPARISON:  None. FINDINGS: Normal mediastinum and cardiac silhouette. Normal pulmonary vasculature. No evidence of effusion, infiltrate, or pneumothorax. No acute bony abnormality. IMPRESSION: No acute cardiopulmonary process. Electronically  Signed   By: Suzy Bouchard M.D.   On: 12/14/2020 12:20   ECHOCARDIOGRAM COMPLETE  Result Date: 12/23/2020    ECHOCARDIOGRAM REPORT   Patient Name:   Patrick Duncan Date of Exam: 12/23/2020 Medical Rec #:  [id number]         Height:       67.0 in Accession #:    [id number]        Weight:       349.0 lb Date of Birth:  04-22-1965        BSA:  2.562 m Patient Age:    30 years          BP:           129/44 mmHg Patient Gender: M                 HR:           82 bpm. Exam Location:  [facility] Procedure: 2D Echo, Cardiac Doppler and Color Doppler Indications:     Pericardial Effusion I31.3  History:         Patient has no prior history of Echocardiogram examinations.                  Risk Factors:Diabetes, Hypertension, Dyslipidemia and Sleep                  Apnea.  Sonographer:     Sherrie Sport Referring Phys:  [id number] [facility] Makhari Dovidio Diagnosing Phys: Ida Rogue MD  Sonographer Comments: Suboptimal apical window and no subcostal window. IMPRESSIONS  1. Left ventricular ejection fraction, by estimation, is 60 to 65%. The left ventricle has normal function. The left ventricle has no regional wall motion abnormalities. There is moderate left ventricular hypertrophy. Left ventricular diastolic parameters are consistent with Grade II diastolic dysfunction (pseudonormalization).  2. Right ventricular systolic function is normal. The right ventricular size is normal. There is normal pulmonary artery systolic pressure. The estimated right ventricular systolic pressure is 07.6 mmHg.  3. Left atrial size was mildly dilated.  4. A small to moderate noncircumferential pericardial effusion is present.No tamponade.  5. The mitral valve is normal in structure. No evidence of mitral valve regurgitation. No evidence of mitral stenosis.  6. The aortic valve was not well visualized. Aortic valve regurgitation is not visualized. No aortic stenosis is present.  7. The inferior vena cava is normal in size with greater than 50%  respiratory variability, suggesting right atrial pressure of 3 mmHg. FINDINGS  Left Ventricle: Left ventricular ejection fraction, by estimation, is 60 to 65%. The left ventricle has normal function. The left ventricle has no regional wall motion abnormalities. The left ventricular internal cavity size was normal in size. There is  moderate left ventricular hypertrophy. Left ventricular diastolic parameters are consistent with Grade II diastolic dysfunction (pseudonormalization). Right Ventricle: The right ventricular size is normal. No increase in right ventricular wall thickness. Right ventricular systolic function is normal. There is normal pulmonary artery systolic pressure. The tricuspid regurgitant velocity is 1.59 m/s, and  with an assumed right atrial pressure of 5 mmHg, the estimated right ventricular systolic pressure is 22.6 mmHg. Left Atrium: Left atrial size was mildly dilated. Right Atrium: Right atrial size was normal in size. Pericardium: A small pericardial effusion is present. There is no evidence of cardiac tamponade. Mitral Valve: The mitral valve is normal in structure. No evidence of mitral valve regurgitation. No evidence of mitral valve stenosis. MV peak gradient, 5.7 mmHg. The mean mitral valve gradient is 3.0 mmHg. Tricuspid Valve: The tricuspid valve is normal in structure. Tricuspid valve regurgitation is mild . No evidence of tricuspid stenosis. Aortic Valve: The aortic valve was not well visualized. Aortic valve regurgitation is not visualized. No aortic stenosis is present. Aortic valve mean gradient measures 4.0 mmHg. Aortic valve peak gradient measures 7.4 mmHg. Aortic valve area, by VTI measures 4.99 cm. Pulmonic Valve: The pulmonic valve was normal in structure. Pulmonic valve regurgitation is not visualized. No evidence of pulmonic stenosis. Aorta: The aortic root is normal in size and  structure. Venous: The inferior vena cava is normal in size with greater than 50% respiratory  variability, suggesting right atrial pressure of 3 mmHg. IAS/Shunts: No atrial level shunt detected by color flow Doppler.  LEFT VENTRICLE PLAX 2D LVIDd:         5.70 cm   Diastology LVIDs:         3.50 cm   LV e' medial:    6.64 cm/s LV PW:         1.40 cm   LV E/e' medial:  18.4 LV IVS:        1.20 cm   LV e' lateral:   10.30 cm/s LVOT diam:     2.20 cm   LV E/e' lateral: 11.8 LV SV:         111 LV SV Index:   43 LVOT Area:     3.80 cm  RIGHT VENTRICLE RV Basal diam:  5.00 cm RV S prime:     14.90 cm/s TAPSE (M-mode): 5.2 cm LEFT ATRIUM              Index        RIGHT ATRIUM           Index LA diam:        4.70 cm  1.83 cm/m   RA Area:     46.30 cm LA Vol (A2C):   126.0 ml 49.18 ml/m  RA Volume:   221.00 ml 86.26 ml/m LA Vol (A4C):   105.0 ml 40.98 ml/m LA Biplane Vol: 118.0 ml 46.06 ml/m  AORTIC VALVE                    PULMONIC VALVE AV Area (Vmax):    4.64 cm     PV Vmax:        1.06 m/s AV Area (Vmean):   3.99 cm     PV Vmean:       82.200 cm/s AV Area (VTI):     4.99 cm     PV VTI:         0.204 m AV Vmax:           136.00 cm/s  PV Peak grad:   4.5 mmHg AV Vmean:          92.900 cm/s  PV Mean grad:   3.0 mmHg AV VTI:            0.222 m      RVOT Peak grad: 7 mmHg AV Peak Grad:      7.4 mmHg AV Mean Grad:      4.0 mmHg LVOT Vmax:         166.00 cm/s LVOT Vmean:        97.500 cm/s LVOT VTI:          0.291 m LVOT/AV VTI ratio: 1.31  AORTA Ao Root diam: 3.33 cm MITRAL VALVE                TRICUSPID VALVE MV Area (PHT): 3.33 cm     TR Peak grad:   10.1 mmHg MV Area VTI:   3.76 cm     TR Vmax:        159.00 cm/s MV Peak grad:  5.7 mmHg MV Mean grad:  3.0 mmHg     SHUNTS MV Vmax:       1.19 m/s     Systemic VTI:  0.29 m MV Vmean:      82.0 cm/s  Systemic Diam: 2.20 cm MV Decel Time: 228 msec     Pulmonic VTI:  0.260 m MV E velocity: 122.00 cm/s MV A velocity: 121.00 cm/s MV E/A ratio:  1.01 Ida Rogue MD Electronically signed by Ida Rogue MD Signature Date/Time: 12/23/2020/10:15:34 AM    Final      Subjective: Patient was seen and examined during morning rounds today.  He was waiting for his procedure.  No more per rectum bleeding.  Patient was having watery brown bowel movements with colonoscopy prep.  Denies any abdominal pain.  Discharge Exam: Vitals:   12/23/20 1313 12/23/20 1324  BP: 137/72 138/72  Pulse: 94 95  Resp: 15 14  Temp: 97.7 F (36.5 C)   SpO2: 99% 100%   Vitals:   12/23/20 1251 12/23/20 1300 12/23/20 1313 12/23/20 1324  BP: (!) 117/50 129/63 137/72 138/72  Pulse: 99 98 94 95  Resp: (!) 25 (!) 23 15 14   Temp:   97.7 F (36.5 C)   TempSrc:   Temporal   SpO2: 95% 98% 99% 100%    General: Pt is alert, awake, not in acute distress Cardiovascular: RRR, S1/S2 +, no rubs, no gallops Respiratory: CTA bilaterally, no wheezing, no rhonchi Abdominal: Soft, NT, ND, bowel sounds + Extremities: no edema, no cyanosis   The results of significant diagnostics from this hospitalization (including imaging, microbiology, ancillary and laboratory) are listed below for reference.    Microbiology: Recent Results (from the past 240 hour(s))  Resp Panel by RT-PCR (Flu A&B, Covid) Nasopharyngeal Swab     Status: None   Collection Time: 12/21/20  5:11 PM   Specimen: Nasopharyngeal Swab; Nasopharyngeal(NP) swabs in vial transport medium  Result Value Ref Range Status   SARS Coronavirus 2 by RT PCR NEGATIVE NEGATIVE Final    Comment: (NOTE) SARS-CoV-2 target nucleic acids are NOT DETECTED.  The SARS-CoV-2 RNA is generally detectable in upper respiratory specimens during the acute phase of infection. The lowest concentration of SARS-CoV-2 viral copies this assay can detect is 138 copies/mL. A negative result does not preclude SARS-Cov-2 infection and should not be used as the sole basis for treatment or other patient management decisions. A negative result may occur with  improper specimen collection/handling, submission of specimen other than nasopharyngeal swab,  presence of viral mutation(s) within the areas targeted by this assay, and inadequate number of viral copies(<138 copies/mL). A negative result must be combined with clinical observations, patient history, and epidemiological information. The expected result is Negative.  Fact Sheet for Patients:  EntrepreneurPulse.com.au  Fact Sheet for Healthcare Providers:  IncredibleEmployment.be  This test is no t yet approved or cleared by the [place] FDA and  has been authorized for detection and/or diagnosis of SARS-CoV-2 by FDA under an Emergency Use Authorization (EUA). This EUA will remain  in effect (meaning this test can be used) for the duration of the COVID-19 declaration under Section 564(b)(1) of the Act, 21 U.S.C.section 360bbb-3(b)(1), unless the authorization is terminated  or revoked sooner.       Influenza A by PCR NEGATIVE NEGATIVE Final   Influenza B by PCR NEGATIVE NEGATIVE Final    Comment: (NOTE) The Xpert Xpress SARS-CoV-2/FLU/RSV plus assay is intended as an aid in the diagnosis of influenza from Nasopharyngeal swab specimens and should not be used as a sole basis for treatment. Nasal washings and aspirates are unacceptable for Xpert Xpress SARS-CoV-2/FLU/RSV testing.  Fact Sheet for Patients: EntrepreneurPulse.com.au  Fact Sheet for Healthcare Providers: IncredibleEmployment.be  This test is  not yet approved or cleared by the [place] and has been authorized for detection and/or diagnosis of SARS-CoV-2 by FDA under an Emergency Use Authorization (EUA). This EUA will remain in effect (meaning this test can be used) for the duration of the COVID-19 declaration under Section 564(b)(1) of the Act, 21 U.S.C. section 360bbb-3(b)(1), unless the authorization is terminated or revoked.  Performed at [facility], [address] [place], [place] [address]       Labs: BNP (last 3 results) No results for input(s): BNP in the last 8760 hours. Basic Metabolic Panel: Recent Labs  Lab 12/21/20 1435 12/21/20 2316 12/22/20 0217 12/23/20 0631  NA 126* 129* 129* 135  K 4.3  --  4.1 4.1  CL 93*  --  99 105  CO2 23  --  22 23  GLUCOSE 338*  --  215* 191*  BUN 80*  --  70* 35*  CREATININE 1.85*  --  1.38* 1.08  CALCIUM 9.1  --  9.0 8.6*  MG  --   --  2.4  --    Liver Function Tests: No results for input(s): AST, ALT, ALKPHOS, BILITOT, PROT, ALBUMIN in the last 168 hours. No results for input(s): LIPASE, AMYLASE in the last 168 hours. No results for input(s): AMMONIA in the last 168 hours. CBC: Recent Labs  Lab 12/21/20 1435 12/21/20 2316 12/22/20 0217 12/22/20 1333 12/22/20 1650 12/23/20 0631  WBC 2.3*  --  1.8* 3.8* 3.8* 1.6*  HGB 7.0* 6.5* 6.3* 7.4* 8.3* 7.5*  HCT 20.5* 19.1* 18.7* 21.6* 24.0* 21.9*  MCV 99.5  --  98.9 96.4 96.4 96.9  PLT 341  --  330 306 341 303   Cardiac Enzymes: No results for input(s): CKTOTAL, CKMB, CKMBINDEX, TROPONINI in the last 168 hours. BNP: Invalid input(s): POCBNP CBG: Recent Labs  Lab 12/22/20 1956 12/22/20 2326 12/23/20 0425 12/23/20 0759 12/23/20 1127  GLUCAP 285* 224* 200* 169* 160*   D-Dimer No results for input(s): DDIMER in the last 72 hours. Hgb A1c Recent Labs    12/21/20 1435  HGBA1C 9.4*   Lipid Profile No results for input(s): CHOL, HDL, LDLCALC, TRIG, CHOLHDL, LDLDIRECT in the last 72 hours. Thyroid function studies No results for input(s): TSH, T4TOTAL, T3FREE, THYROIDAB in the last 72 hours.  Invalid input(s): FREET3 Anemia work up Recent Labs    12/21/20 1435 12/22/20 0217  VITAMINB12  --  990*  FOLATE  --  21.4  FERRITIN 242 232  TIBC 452* 455*  IRON 78 78   Urinalysis    Component Value Date/Time   COLORURINE YELLOW 12/22/2020 1500   APPEARANCEUR CLEAR (A) 12/22/2020 1500   LABSPEC 1.015 12/22/2020 1500   PHURINE 7.0 12/22/2020 1500   GLUCOSEU 500  (A) 12/22/2020 1500   HGBUR NEGATIVE 12/22/2020 1500   BILIRUBINUR NEGATIVE 12/22/2020 1500   KETONESUR 15 (A) 12/22/2020 1500   PROTEINUR NEGATIVE 12/22/2020 1500   NITRITE NEGATIVE 12/22/2020 1500   LEUKOCYTESUR NEGATIVE 12/22/2020 1500   Sepsis Labs Invalid input(s): PROCALCITONIN,  WBC,  LACTICIDVEN Microbiology Recent Results (from the past 240 hour(s))  Resp Panel by RT-PCR (Flu A&B, Covid) Nasopharyngeal Swab     Status: None   Collection Time: 12/21/20  5:11 PM   Specimen: Nasopharyngeal Swab; Nasopharyngeal(NP) swabs in vial transport medium  Result Value Ref Range Status   SARS Coronavirus 2 by RT PCR NEGATIVE NEGATIVE Final    Comment: (NOTE) SARS-CoV-2 target nucleic acids are NOT DETECTED.  The SARS-CoV-2 RNA  is generally detectable in upper respiratory specimens during the acute phase of infection. The lowest concentration of SARS-CoV-2 viral copies this assay can detect is 138 copies/mL. A negative result does not preclude SARS-Cov-2 infection and should not be used as the sole basis for treatment or other patient management decisions. A negative result may occur with  improper specimen collection/handling, submission of specimen other than nasopharyngeal swab, presence of viral mutation(s) within the areas targeted by this assay, and inadequate number of viral copies(<138 copies/mL). A negative result must be combined with clinical observations, patient history, and epidemiological information. The expected result is Negative.  Fact Sheet for Patients:  EntrepreneurPulse.com.au  Fact Sheet for Healthcare Providers:  IncredibleEmployment.be  This test is no t yet approved or cleared by the [place] FDA and  has been authorized for detection and/or diagnosis of SARS-CoV-2 by FDA under an Emergency Use Authorization (EUA). This EUA will remain  in effect (meaning this test can be used) for the duration of the COVID-19  declaration under Section 564(b)(1) of the Act, 21 U.S.C.section 360bbb-3(b)(1), unless the authorization is terminated  or revoked sooner.       Influenza A by PCR NEGATIVE NEGATIVE Final   Influenza B by PCR NEGATIVE NEGATIVE Final    Comment: (NOTE) The Xpert Xpress SARS-CoV-2/FLU/RSV plus assay is intended as an aid in the diagnosis of influenza from Nasopharyngeal swab specimens and should not be used as a sole basis for treatment. Nasal washings and aspirates are unacceptable for Xpert Xpress SARS-CoV-2/FLU/RSV testing.  Fact Sheet for Patients: EntrepreneurPulse.com.au  Fact Sheet for Healthcare Providers: IncredibleEmployment.be  This test is not yet approved or cleared by the [place] FDA and has been authorized for detection and/or diagnosis of SARS-CoV-2 by FDA under an Emergency Use Authorization (EUA). This EUA will remain in effect (meaning this test can be used) for the duration of the COVID-19 declaration under Section 564(b)(1) of the Act, 21 U.S.C. section 360bbb-3(b)(1), unless the authorization is terminated or revoked.  Performed at [facility], [address], [place], [place] [address]     Time coordinating discharge: Over 30 minutes  SIGNED:  [name], MD  [facility] 12/23/2020, 2:28 PM  If 7PM-7AM, please contact night-coverage [email or web address]  This record has been created using Systems analyst. Errors have been sought and corrected,but may not always be located. Such creation errors do not reflect on the standard of care.

## 2020-12-23 NOTE — Progress Notes (Signed)
*  PRELIMINARY RESULTS* Echocardiogram 2D Echocardiogram has been performed.  Patrick Duncan 12/23/2020, 8:53 AM

## 2020-12-23 NOTE — Plan of Care (Signed)

## 2020-12-23 NOTE — Op Note (Signed)
[facility] Gastroenterology Patient Name: Patrick Duncan Procedure Date: 12/23/2020 12:09 PM MRN: [id number] Account #: [id number] Date of Birth: 05-11-1965 Admit Type: Inpatient Age: 55 Room: [facility] ENDO ROOM 3 Gender: Male Note Status: Finalized Instrument Name: Michaelle Birks [id number] Procedure:             Upper GI endoscopy Indications:           Hematochezia Providers:             Lucilla Lame MD, MD Referring MD:          No Local Md, MD (Referring MD) Medicines:             Propofol per Anesthesia Complications:         No immediate complications. Procedure:             Pre-Anesthesia Assessment:                        - Prior to the procedure, a History and Physical was                         performed, and patient medications and allergies were                         reviewed. The patient's tolerance of previous                         anesthesia was also reviewed. The risks and benefits                         of the procedure and the sedation options and risks                         were discussed with the patient. All questions were                         answered, and informed consent was obtained. Prior                         Anticoagulants: The patient has taken no previous                         anticoagulant or antiplatelet agents. ASA Grade                         Assessment: III - A patient with severe systemic                         disease. After reviewing the risks and benefits, the                         patient was deemed in satisfactory condition to                         undergo the procedure.                        After obtaining informed consent, the endoscope was  passed under direct vision. Throughout the procedure,                         the patient's blood pressure, pulse, and oxygen                         saturations were monitored continuously. The Endoscope                         was  introduced through the mouth, and advanced to the                         second part of duodenum. The upper GI endoscopy was                         accomplished without difficulty. The patient tolerated                         the procedure well. Findings:      The examined esophagus was normal.      The stomach was normal.      The examined duodenum was normal. Impression:            - Normal esophagus.                        - Normal stomach.                        - Normal examined duodenum.                        - No specimens collected. Recommendation:        - Return patient to hospital ward for ongoing care.                        - Resume previous diet.                        - Continue present medications.                        - Perform a colonoscopy today. Procedure Code(s):     --- Professional ---                        [phone], Esophagogastroduodenoscopy, flexible,                         transoral; diagnostic, including collection of                         specimen(s) by brushing or washing, when performed                         (separate procedure) Diagnosis Code(s):     --- Professional ---                        K92.1, Melena (includes Hematochezia) CPT copyright 2019 American Medical Association. All rights reserved. The codes documented in this report are preliminary and upon coder review may  be revised to meet  current compliance requirements. Lucilla Lame MD, MD 12/23/2020 12:23:47 PM This report has been signed electronically. Number of Addenda: 0 Note Initiated On: 12/23/2020 12:09 PM Estimated Blood Loss:  Estimated blood loss: none.      [facility]

## 2020-12-23 NOTE — Op Note (Signed)
[facility] Gastroenterology Patient Name: Patrick Duncan Procedure Date: 12/23/2020 12:08 PM MRN: [id number] Account #: [id number] Date of Birth: [birth year] Admit Type: Inpatient Age: 55 Room: [facility] ENDO ROOM 3 Gender: Male Note Status: Finalized Instrument Name: Jasper Riling [id number] Procedure:             Colonoscopy Indications:           Hematochezia Providers:             Lucilla Lame MD, MD Referring MD:          No Local Md, MD (Referring MD) Medicines:             Propofol per Anesthesia Complications:         No immediate complications. Procedure:             Pre-Anesthesia Assessment:                        - Prior to the procedure, a History and Physical was                         performed, and patient medications and allergies were                         reviewed. The patient's tolerance of previous                         anesthesia was also reviewed. The risks and benefits                         of the procedure and the sedation options and risks                         were discussed with the patient. All questions were                         answered, and informed consent was obtained. Prior                         Anticoagulants: The patient has taken no previous                         anticoagulant or antiplatelet agents. ASA Grade                         Assessment: III - A patient with severe systemic                         disease. After reviewing the risks and benefits, the                         patient was deemed in satisfactory condition to                         undergo the procedure.                        After obtaining informed consent, the colonoscope was  passed under direct vision. Throughout the procedure,                         the patient's blood pressure, pulse, and oxygen                         saturations were monitored continuously. The                         Colonoscope was introduced  through the anus and                         advanced to the the cecum, identified by appendiceal                         orifice and ileocecal valve. The colonoscopy was                         performed without difficulty. The patient tolerated                         the procedure well. The quality of the bowel                         preparation was excellent. Findings:      The perianal and digital rectal examinations were normal.      Non-bleeding internal hemorrhoids were found during retroflexion. The       hemorrhoids were Grade I (internal hemorrhoids that do not prolapse). Impression:            - Non-bleeding internal hemorrhoids.                        - No specimens collected. Recommendation:        - Return patient to hospital ward for ongoing care.                        - Resume previous diet.                        - Continue present medications. Procedure Code(s):     --- Professional ---                        [phone], Colonoscopy, flexible; diagnostic, including                         collection of specimen(s) by brushing or washing, when                         performed (separate procedure) Diagnosis Code(s):     --- Professional ---                        K92.1, Melena (includes Hematochezia) CPT copyright 2019 American Medical Association. All rights reserved. The codes documented in this report are preliminary and upon coder review may  be revised to meet current compliance requirements. Lucilla Lame MD, MD 12/23/2020 12:46:39 PM This report has been signed electronically. Number of Addenda: 0 Note Initiated On: 12/23/2020 12:08 PM Scope Withdrawal Time: 0 hours 4 minutes  28 seconds  Total Procedure Duration: 0 hours 20 minutes 30 seconds  Estimated Blood Loss:  Estimated blood loss: none.      [facility]

## 2020-12-23 NOTE — Transfer of Care (Signed)
Immediate Anesthesia Transfer of Care Note  Patient: Patrick Duncan  Procedure(s) Performed: COLONOSCOPY WITH PROPOFOL ESOPHAGOGASTRODUODENOSCOPY (EGD) WITH PROPOFOL  Patient Location: PACU  Anesthesia Type:General  Level of Consciousness: awake, alert  and oriented  Airway & Oxygen Therapy: Patient Spontanous Breathing  Post-op Assessment: Report given to RN and Post -op Vital signs reviewed and stable  Post vital signs: Reviewed and stable  Last Vitals:  Vitals Value Taken Time  BP 117/50 12/23/20 1251  Temp    Pulse 99 12/23/20 1251  Resp 28 12/23/20 1251  SpO2 95 % 12/23/20 1251  Vitals shown include unvalidated device data.  Last Pain:  Vitals:   12/23/20 1133  TempSrc: Temporal  PainSc: 0-No pain      Patients Stated Pain Goal: 0 (02/98/47 3085)  Complications: No notable events documented.

## 2020-12-23 NOTE — Progress Notes (Addendum)
[facility] Encounter Note  Patient: Patrick Duncan / Admit Date: 12/21/2020 / Date of Encounter: 12/23/2020, 11:12 AM   Subjective: Patient is in good spirits this morning and states he feels better than yesterday.  Wife is present at bedside.  He had his echo done this morning which showed a small-moderate non-circumferential pericardial effusion which is unchanged from prior study 1 month ago.  Patient denies any chest pain, shortness of breath, worsening leg swelling. He is NPO in anticipation of colonoscopy this morning.   Review of Systems: Positive for: None Negative for: Vision change, hearing change, syncope, dizziness, nausea, vomiting,diarrhea, bloody stool, stomach pain, cough, congestion, diaphoresis, urinary frequency, urinary pain,skin lesions, skin rashes Others previously listed  Objective:  Physical Exam: Blood pressure (!) 143/43, pulse 81, temperature 97.8 F (36.6 C), temperature source Oral, resp. rate 16, SpO2 100 %. There is no height or weight on file to calculate BMI. General: Pleasant obese Caucasian male, well nourished, in no acute distress sitting upright in hospital bed eating lunch.  Wife present at bedside Head eyes ears nose throat: Normocephalic, atraumatic, sclera non-icteric. Neck: supple, no apparent masses Lungs: Normal respiratory effort on room air.  no wheezes, no rales, no rhonchi.  Heart: Regular rate and normal rhythm with easily ascultated S1 S2. no murmur gallop, no rub, jugular venous pressure is normal Abdomen: obese appearing without distention.  Extremities: Left lower extremity with trace edema and some mild dermatitis on anterior shin and RLE without edema. no cyanosis, no clubbing, no ulcers  Peripheral : 2+ bilateral radial pulses,  2+ bilateral dorsal pedal pulse Neuro: Alert and oriented. Moves all extremities spontaneously. Psych:  Responds to questions appropriately with a normal affect.  Intake/Output  Summary (Last 24 hours) at 12/23/2020 1112 Last data filed at 12/22/2020 2323 Gross per 24 hour  Intake 574 ml  Output --  Net 574 ml    Inpatient Medications:   sodium chloride   Intravenous Once   fenofibrate  160 mg Oral Daily   insulin aspart  0-6 Units Subcutaneous Q4H   insulin glargine-yfgn  10 Units Subcutaneous Daily   [START ON 12/25/2020] pantoprazole  40 mg Intravenous Q12H   pravastatin  10 mg Oral q1800   Infusions:   sodium chloride 60 mL/hr at 12/22/20 2343   sodium chloride 500 mL/hr at 12/22/20 1449   sodium chloride      Labs: Recent Labs    12/22/20 0217 12/23/20 0631  NA 129* 135  K 4.1 4.1  CL 99 105  CO2 22 23  GLUCOSE 215* 191*  BUN 70* 35*  CREATININE 1.38* 1.08  CALCIUM 9.0 8.6*  MG 2.4  --    No results for input(s): AST, ALT, ALKPHOS, BILITOT, PROT, ALBUMIN in the last 72 hours. Recent Labs    12/22/20 1650 12/23/20 0631  WBC 3.8* 1.6*  HGB 8.3* 7.5*  HCT 24.0* 21.9*  MCV 96.4 96.9  PLT 341 303   No results for input(s): CKTOTAL, CKMB, TROPONINI in the last 72 hours. Invalid input(s): POCBNP Recent Labs    12/21/20 1435  HGBA1C 9.4*     Weights: There were no vitals filed for this visit.   Radiology/Studies:  DG Chest 2 View  Result Date: 12/14/2020 CLINICAL DATA:  Short of breath and hyperglycemia EXAM: CHEST - 2 VIEW COMPARISON:  None. FINDINGS: Normal mediastinum and cardiac silhouette. Normal pulmonary vasculature. No evidence of effusion, infiltrate, or pneumothorax. No acute bony abnormality. IMPRESSION: No acute cardiopulmonary  process. Electronically Signed   By: Suzy Bouchard M.D.   On: 12/14/2020 12:20   ECHOCARDIOGRAM COMPLETE  Result Date: 12/23/2020    ECHOCARDIOGRAM REPORT   Patient Name:   Patrick Duncan Date of Exam: 12/23/2020 Medical Rec #:  [id number]         Height:       67.0 in Accession #:    [id number]        Weight:       349.0 lb Date of Birth:  [birth year]        BSA:          2.562 m Patient  Age:    55 years          BP:           129/44 mmHg Patient Gender: M                 HR:           82 bpm. Exam Location:  [facility] Procedure: 2D Echo, Cardiac Doppler and Color Doppler Indications:     Pericardial Effusion I31.3  History:         Patient has no prior history of Echocardiogram examinations.                  Risk Factors:Diabetes, Hypertension, Dyslipidemia and Sleep                  Apnea.  Sonographer:     Sherrie Sport Referring Phys:  [id number] [facility] AMIN Diagnosing Phys: Ida Rogue MD  Sonographer Comments: Suboptimal apical window and no subcostal window. IMPRESSIONS  1. Left ventricular ejection fraction, by estimation, is 60 to 65%. The left ventricle has normal function. The left ventricle has no regional wall motion abnormalities. There is moderate left ventricular hypertrophy. Left ventricular diastolic parameters are consistent with Grade II diastolic dysfunction (pseudonormalization).  2. Right ventricular systolic function is normal. The right ventricular size is normal. There is normal pulmonary artery systolic pressure. The estimated right ventricular systolic pressure is 34.1 mmHg.  3. Left atrial size was mildly dilated.  4. A small to moderate noncircumferential pericardial effusion is present.No tamponade.  5. The mitral valve is normal in structure. No evidence of mitral valve regurgitation. No evidence of mitral stenosis.  6. The aortic valve was not well visualized. Aortic valve regurgitation is not visualized. No aortic stenosis is present.  7. The inferior vena cava is normal in size with greater than 50% respiratory variability, suggesting right atrial pressure of 3 mmHg. FINDINGS  Left Ventricle: Left ventricular ejection fraction, by estimation, is 60 to 65%. The left ventricle has normal function. The left ventricle has no regional wall motion abnormalities. The left ventricular internal cavity size was normal in size. There is  moderate left ventricular hypertrophy. Left  ventricular diastolic parameters are consistent with Grade II diastolic dysfunction (pseudonormalization). Right Ventricle: The right ventricular size is normal. No increase in right ventricular wall thickness. Right ventricular systolic function is normal. There is normal pulmonary artery systolic pressure. The tricuspid regurgitant velocity is 1.59 m/s, and  with an assumed right atrial pressure of 5 mmHg, the estimated right ventricular systolic pressure is 96.2 mmHg. Left Atrium: Left atrial size was mildly dilated. Right Atrium: Right atrial size was normal in size. Pericardium: A small pericardial effusion is present. There is no evidence of cardiac tamponade. Mitral Valve: The mitral valve is normal in structure. No evidence of mitral valve regurgitation. No evidence  of mitral valve stenosis. MV peak gradient, 5.7 mmHg. The mean mitral valve gradient is 3.0 mmHg. Tricuspid Valve: The tricuspid valve is normal in structure. Tricuspid valve regurgitation is mild . No evidence of tricuspid stenosis. Aortic Valve: The aortic valve was not well visualized. Aortic valve regurgitation is not visualized. No aortic stenosis is present. Aortic valve mean gradient measures 4.0 mmHg. Aortic valve peak gradient measures 7.4 mmHg. Aortic valve area, by VTI measures 4.99 cm. Pulmonic Valve: The pulmonic valve was normal in structure. Pulmonic valve regurgitation is not visualized. No evidence of pulmonic stenosis. Aorta: The aortic root is normal in size and structure. Venous: The inferior vena cava is normal in size with greater than 50% respiratory variability, suggesting right atrial pressure of 3 mmHg. IAS/Shunts: No atrial level shunt detected by color flow Doppler.  LEFT VENTRICLE PLAX 2D LVIDd:         5.70 cm   Diastology LVIDs:         3.50 cm   LV e' medial:    6.64 cm/s LV PW:         1.40 cm   LV E/e' medial:  18.4 LV IVS:        1.20 cm   LV e' lateral:   10.30 cm/s LVOT diam:     2.20 cm   LV E/e' lateral:  11.8 LV SV:         111 LV SV Index:   43 LVOT Area:     3.80 cm  RIGHT VENTRICLE RV Basal diam:  5.00 cm RV S prime:     14.90 cm/s TAPSE (M-mode): 5.2 cm LEFT ATRIUM              Index        RIGHT ATRIUM           Index LA diam:        4.70 cm  1.83 cm/m   RA Area:     46.30 cm LA Vol (A2C):   126.0 ml 49.18 ml/m  RA Volume:   221.00 ml 86.26 ml/m LA Vol (A4C):   105.0 ml 40.98 ml/m LA Biplane Vol: 118.0 ml 46.06 ml/m  AORTIC VALVE                    PULMONIC VALVE AV Area (Vmax):    4.64 cm     PV Vmax:        1.06 m/s AV Area (Vmean):   3.99 cm     PV Vmean:       82.200 cm/s AV Area (VTI):     4.99 cm     PV VTI:         0.204 m AV Vmax:           136.00 cm/s  PV Peak grad:   4.5 mmHg AV Vmean:          92.900 cm/s  PV Mean grad:   3.0 mmHg AV VTI:            0.222 m      RVOT Peak grad: 7 mmHg AV Peak Grad:      7.4 mmHg AV Mean Grad:      4.0 mmHg LVOT Vmax:         166.00 cm/s LVOT Vmean:        97.500 cm/s LVOT VTI:          0.291 m LVOT/AV VTI ratio: 1.31  AORTA Ao  Root diam: 3.33 cm MITRAL VALVE                TRICUSPID VALVE MV Area (PHT): 3.33 cm     TR Peak grad:   10.1 mmHg MV Area VTI:   3.76 cm     TR Vmax:        159.00 cm/s MV Peak grad:  5.7 mmHg MV Mean grad:  3.0 mmHg     SHUNTS MV Vmax:       1.19 m/s     Systemic VTI:  0.29 m MV Vmean:      82.0 cm/s    Systemic Diam: 2.20 cm MV Decel Time: 228 msec     Pulmonic VTI:  0.260 m MV E velocity: 122.00 cm/s MV A velocity: 121.00 cm/s MV E/A ratio:  1.01 Ida Rogue MD Electronically signed by Ida Rogue MD Signature Date/Time: 12/23/2020/10:15:34 AM    Final      Assessment and Recommendation  55 y.o. male 55 year old male past medical history of current posterior, noncircumferential pericardial effusion, chronic dyspnea on exertion, hypertension, hyperlipidemia, type 2 diabetes, obesity, obstructive sleep apnea, and IgA deficiency who presented to [facility] emergency department on 12/21/2020 with bright red blood per rectum.   Cardiology consulted for medical optimization prior to colonoscopy on 12/23/2020.    Plan: #small pericardial effusion -Since the effusion appears to be small and improving without evidence of significant hemodynamic complications, no further testing required before colonoscopy.  -Echocardiogram performed today revealed small to moderate noncircumferential pericardial effusion without evidence of tamponade. -Patient is considered low risk for cardiac complications prior to his colonoscopy today, per Dr. Nehemiah Massed. Discussed in detail with patient and he is in agreement with this plan of care.   #Hypertension -current home regimen for blood pressure is Amlodipine 10 mg, metoprolol 50 mg BID, ramipril 10 mg, torsemide 20 mg once daily, and spironolactone which are currently being held after his transfusion reaction and soft BP on 12/7. -Recommend restarting home blood pressure medications tomorrow morning   #hyperlipidemia -Continue fenofibrate and pravastatin   #AKI on CKD -avoid nephrotoxic agents   Signed, Orinda Kenner, PA-C  The patient has been interviewed and examined. I agree with assessment and plan above. Serafina Royals MD [facility]

## 2020-12-23 NOTE — Anesthesia Preprocedure Evaluation (Addendum)
Anesthesia Evaluation  Patient identified by MRN, date of birth, ID band Patient awake    Reviewed: Allergy & Precautions, H&P , NPO status , Patient's Chart, lab work & pertinent test results  History of Anesthesia Complications Negative for: history of anesthetic complications  Airway Mallampati: III  TM Distance: <3 FB Neck ROM: full   Comment: Large neck Dental no notable dental hx.    Pulmonary sleep apnea and Continuous Positive Airway Pressure Ventilation , neg COPD,    Pulmonary exam normal        Cardiovascular hypertension, (-) angina(-) Past MI and (-) Cardiac Stents negative cardio ROS Normal cardiovascular exam(-) dysrhythmias   Pericardial effusion - improved from moderate to small Normal LV function "Plan has been to perform serial echocardiograms and diuretic therapy which patient has been tolerating well"  Low risk for cardiac complications per Cardiology   Neuro/Psych PSYCHIATRIC DISORDERS Depression negative neurological ROS     GI/Hepatic negative GI ROS, Neg liver ROS, BRBPR   Endo/Other  diabetesMorbid obesity (BMI 55)  Renal/GU Renal disease (CKD)  negative genitourinary   Musculoskeletal  (+) Arthritis ,   Abdominal   Peds  Hematology  (+) Blood dyscrasia (Hgb 7.5 g/dL), anemia ,   Anesthesia Other Findings "55 y.o. male with a past medical history of current posterior pericardial effusion, chronic dyspnea on exertion, hypertension, hyperlipidemia, type 2 diabetes, obesity, obstructive sleep apnea, and IgA deficiency who presented to [facility] emergency department on 12/21/2020 with bright red blood per rectum."  Reproductive/Obstetrics negative OB ROS                          Anesthesia Physical Anesthesia Plan  ASA: 4  Anesthesia Plan: General   Post-op Pain Management:    Induction:   PONV Risk Score and Plan: Propofol infusion and TIVA  Airway Management  Planned: Nasal CPAP  Additional Equipment:   Intra-op Plan:   Post-operative Plan:   Informed Consent: I have reviewed the patients History and Physical, chart, labs and discussed the procedure including the risks, benefits and alternatives for the proposed anesthesia with the patient or authorized representative who has indicated his/her understanding and acceptance.     Dental Advisory Given  Plan Discussed with: Anesthesiologist, CRNA and Surgeon  Anesthesia Plan Comments: (No N/V.  Appropriately NPO.  Reasonable to proceed with IVGA native airway, GETA backup.)      Anesthesia Quick Evaluation

## 2020-12-23 NOTE — Anesthesia Procedure Notes (Signed)
Date/Time: 12/23/2020 12:27 PM Performed by: Vaughan Sine Pre-anesthesia Checklist: Patient identified, Emergency Drugs available, Suction available, Patient being monitored and Timeout performed Patient Re-evaluated:Patient Re-evaluated prior to induction Oxygen Delivery Method: Supernova nasal CPAP Preoxygenation: Pre-oxygenation with 100% oxygen Induction Type: IV induction Airway Equipment and Method: Bite block Placement Confirmation: positive ETCO2 and CO2 detector

## 2020-12-23 NOTE — TOC Initial Note (Signed)
Transition of Care [facility]) - Initial/Assessment Note    Patient Details  Name: JAQUANN GUARISCO MRN: [id number] Date of Birth: 1965/12/08  Transition of Care [facility]) CM/SW Contact:    Conception Oms, RN Phone Number: 12/23/2020, 9:48 AM  Clinical Narrative:    Transition of Care [facility]) Screening Note   Patient Details  Name: CALYB MCQUARRIE Date of Birth: 09/22/1965   Transition of Care [facility]) CM/SW Contact:    Conception Oms, RN Phone Number: 12/23/2020, 9:48 AM    Transition of Care Department [facility]) has reviewed patient and no TOC needs have been identified at this time. We will continue to monitor patient advancement through interdisciplinary progression rounds. If new patient transition needs arise, please place a TOC consult.                       Patient Goals and CMS Choice        Expected Discharge Plan and Services                                                Prior Living Arrangements/Services                       Activities of Daily Living Home Assistive Devices/Equipment: Cane (specify quad or straight) ADL Screening (condition at time of admission) Patient's cognitive ability adequate to safely complete daily activities?: Yes Is the patient deaf or have difficulty hearing?: No Does the patient have difficulty seeing, even when wearing glasses/contacts?: No Does the patient have difficulty concentrating, remembering, or making decisions?: No Patient able to express need for assistance with ADLs?: Yes Does the patient have difficulty dressing or bathing?: No Independently performs ADLs?: Yes (appropriate for developmental age) Does the patient have difficulty walking or climbing stairs?: Yes Weakness of Legs: Both Weakness of Arms/Hands: Both  Permission Sought/Granted                  Emotional Assessment              Admission diagnosis:  GI bleed [K92.2] Symptomatic anemia [D64.9] Patient Active  Problem List   Diagnosis Date Noted   Symptomatic anemia    GI bleed 12/21/2020   Cellulitis 10/22/2016   Diabetes ([place]) 10/22/2016   HTN (hypertension) 10/22/2016   HLD (hyperlipidemia) 10/22/2016   OSA (obstructive sleep apnea) 10/22/2016   AKI (acute kidney injury) ([place]) 10/22/2016   PCP:  Sofie Hartigan, MD Pharmacy:   [facility], [address] [place] [address] Phone: [phone] Fax: [phone]     Social Determinants of Health (SDOH) Interventions    Readmission Risk Interventions No flowsheet data found.

## 2020-12-24 ENCOUNTER — Encounter: Payer: Self-pay | Admitting: Gastroenterology

## 2020-12-24 LAB — TYPE AND SCREEN
ABO/RH(D): O POS
Antibody Screen: POSITIVE
DAT, IgG: POSITIVE
DAT, complement: POSITIVE
Unit division: 0
Unit division: 0
Unit division: 0
Unit division: 0

## 2020-12-24 LAB — BPAM RBC
Blood Product Expiration Date: 202212081404
Blood Product Expiration Date: 202301072359
Blood Product Expiration Date: 202301102359
Blood Product Expiration Date: 202301122359
ISSUE DATE / TIME: 202212071037
ISSUE DATE / TIME: 202212071945
ISSUE DATE / TIME: 202212090835
Unit Type and Rh: 5100
Unit Type and Rh: 5100
Unit Type and Rh: 5100
Unit Type and Rh: 5100

## 2020-12-24 LAB — TRANSFUSION REACTION
DAT C3: POSITIVE
Post RXN DAT IgG: POSITIVE

## 2020-12-24 NOTE — Anesthesia Postprocedure Evaluation (Signed)
Anesthesia Post Note  Patient: Patrick Duncan  Procedure(s) Performed: COLONOSCOPY WITH PROPOFOL ESOPHAGOGASTRODUODENOSCOPY (EGD) WITH PROPOFOL  Patient location during evaluation: PACU Anesthesia Type: General Level of consciousness: awake and alert Pain management: pain level controlled Vital Signs Assessment: post-procedure vital signs reviewed and stable Respiratory status: spontaneous breathing, nonlabored ventilation, respiratory function stable and patient connected to nasal cannula oxygen Cardiovascular status: blood pressure returned to baseline and stable Postop Assessment: no apparent nausea or vomiting Anesthetic complications: no   No notable events documented.   Last Vitals:  Vitals:   12/23/20 1434 12/23/20 1550  BP: (!) 155/52 (!) 115/43  Pulse: 79 92  Resp: 16 18  Temp: 36.7 C 36.7 C  SpO2: 100% 98%    Last Pain:  Vitals:   12/23/20 1434  TempSrc: Oral  PainSc:                  [name]

## 2020-12-28 ENCOUNTER — Inpatient Hospital Stay
Admission: EM | Admit: 2020-12-28 | Discharge: 2021-01-04 | DRG: 812 | Disposition: A | Discharge status: Home / Self Care | Payer: Medicare HMO | Attending: Internal Medicine | Admitting: Internal Medicine

## 2020-12-28 ENCOUNTER — Emergency Department: Payer: Medicare HMO

## 2020-12-28 ENCOUNTER — Encounter: Payer: Self-pay | Admitting: Emergency Medicine

## 2020-12-28 DIAGNOSIS — Z79899 Other long term (current) drug therapy: Secondary | ICD-10-CM | POA: Diagnosis not present

## 2020-12-28 DIAGNOSIS — Z833 Family history of diabetes mellitus: Secondary | ICD-10-CM | POA: Diagnosis not present

## 2020-12-28 DIAGNOSIS — N179 Acute kidney failure, unspecified: Secondary | ICD-10-CM | POA: Diagnosis present

## 2020-12-28 DIAGNOSIS — I10 Essential (primary) hypertension: Secondary | ICD-10-CM | POA: Diagnosis present

## 2020-12-28 DIAGNOSIS — D61818 Other pancytopenia: Secondary | ICD-10-CM | POA: Diagnosis present

## 2020-12-28 DIAGNOSIS — Z883 Allergy status to other anti-infective agents status: Secondary | ICD-10-CM | POA: Diagnosis not present

## 2020-12-28 DIAGNOSIS — D649 Anemia, unspecified: Secondary | ICD-10-CM

## 2020-12-28 DIAGNOSIS — Z8249 Family history of ischemic heart disease and other diseases of the circulatory system: Secondary | ICD-10-CM | POA: Diagnosis not present

## 2020-12-28 DIAGNOSIS — Z881 Allergy status to other antibiotic agents status: Secondary | ICD-10-CM

## 2020-12-28 DIAGNOSIS — Z6841 Body Mass Index (BMI) 40.0 and over, adult: Secondary | ICD-10-CM

## 2020-12-28 DIAGNOSIS — K648 Other hemorrhoids: Secondary | ICD-10-CM | POA: Diagnosis present

## 2020-12-28 DIAGNOSIS — D62 Acute posthemorrhagic anemia: Secondary | ICD-10-CM | POA: Diagnosis present

## 2020-12-28 DIAGNOSIS — D500 Iron deficiency anemia secondary to blood loss (chronic): Secondary | ICD-10-CM | POA: Diagnosis not present

## 2020-12-28 DIAGNOSIS — D72819 Decreased white blood cell count, unspecified: Secondary | ICD-10-CM

## 2020-12-28 DIAGNOSIS — F32A Depression, unspecified: Secondary | ICD-10-CM | POA: Diagnosis present

## 2020-12-28 DIAGNOSIS — Z79891 Long term (current) use of opiate analgesic: Secondary | ICD-10-CM

## 2020-12-28 DIAGNOSIS — K922 Gastrointestinal hemorrhage, unspecified: Secondary | ICD-10-CM | POA: Diagnosis present

## 2020-12-28 DIAGNOSIS — Z7984 Long term (current) use of oral hypoglycemic drugs: Secondary | ICD-10-CM

## 2020-12-28 DIAGNOSIS — E8881 Metabolic syndrome: Secondary | ICD-10-CM | POA: Diagnosis present

## 2020-12-28 DIAGNOSIS — Z794 Long term (current) use of insulin: Secondary | ICD-10-CM | POA: Diagnosis not present

## 2020-12-28 DIAGNOSIS — D802 Selective deficiency of immunoglobulin A [IgA]: Secondary | ICD-10-CM | POA: Diagnosis present

## 2020-12-28 DIAGNOSIS — Z20822 Contact with and (suspected) exposure to covid-19: Secondary | ICD-10-CM | POA: Diagnosis present

## 2020-12-28 DIAGNOSIS — E119 Type 2 diabetes mellitus without complications: Secondary | ICD-10-CM | POA: Diagnosis present

## 2020-12-28 DIAGNOSIS — G4733 Obstructive sleep apnea (adult) (pediatric): Secondary | ICD-10-CM | POA: Diagnosis present

## 2020-12-28 DIAGNOSIS — E785 Hyperlipidemia, unspecified: Secondary | ICD-10-CM | POA: Diagnosis present

## 2020-12-28 DIAGNOSIS — Z888 Allergy status to other drugs, medicaments and biological substances status: Secondary | ICD-10-CM

## 2020-12-28 DIAGNOSIS — Z880 Allergy status to penicillin: Secondary | ICD-10-CM

## 2020-12-28 DIAGNOSIS — M1990 Unspecified osteoarthritis, unspecified site: Secondary | ICD-10-CM | POA: Diagnosis present

## 2020-12-28 DIAGNOSIS — H9190 Unspecified hearing loss, unspecified ear: Secondary | ICD-10-CM | POA: Diagnosis present

## 2020-12-28 DIAGNOSIS — M7989 Other specified soft tissue disorders: Secondary | ICD-10-CM

## 2020-12-28 LAB — CBC WITH DIFFERENTIAL/PLATELET
Abs Immature Granulocytes: 0.01 10*3/uL (ref 0.00–0.07)
Basophils Absolute: 0 10*3/uL (ref 0.0–0.1)
Basophils Relative: 0 %
Eosinophils Absolute: 0 10*3/uL (ref 0.0–0.5)
Eosinophils Relative: 0 %
HCT: 17 % — ABNORMAL LOW (ref 39.0–52.0)
Hemoglobin: 5.8 g/dL — ABNORMAL LOW (ref 13.0–17.0)
Immature Granulocytes: 1 %
Lymphocytes Relative: 8 %
Lymphs Abs: 0.2 10*3/uL — ABNORMAL LOW (ref 0.7–4.0)
MCH: 33.1 pg (ref 26.0–34.0)
MCHC: 34.1 g/dL (ref 30.0–36.0)
MCV: 97.1 fL (ref 80.0–100.0)
Monocytes Absolute: 0.3 10*3/uL (ref 0.1–1.0)
Monocytes Relative: 15 %
Neutro Abs: 1.7 10*3/uL (ref 1.7–7.7)
Neutrophils Relative %: 76 %
Platelets: 255 10*3/uL (ref 150–400)
RBC: 1.75 MIL/uL — ABNORMAL LOW (ref 4.22–5.81)
RDW: 18.4 % — ABNORMAL HIGH (ref 11.5–15.5)
WBC: 2.1 10*3/uL — ABNORMAL LOW (ref 4.0–10.5)
nRBC: 0 % (ref 0.0–0.2)

## 2020-12-28 LAB — COMPREHENSIVE METABOLIC PANEL
ALT: 17 U/L (ref 0–44)
AST: 20 U/L (ref 15–41)
Albumin: 3.5 g/dL (ref 3.5–5.0)
Alkaline Phosphatase: 35 U/L — ABNORMAL LOW (ref 38–126)
Anion gap: 7 (ref 5–15)
BUN: 92 mg/dL — ABNORMAL HIGH (ref 6–20)
CO2: 22 mmol/L (ref 22–32)
Calcium: 8.8 mg/dL — ABNORMAL LOW (ref 8.9–10.3)
Chloride: 103 mmol/L (ref 98–111)
Creatinine, Ser: 2.06 mg/dL — ABNORMAL HIGH (ref 0.61–1.24)
GFR, Estimated: 38 mL/min — ABNORMAL LOW (ref >60)
Glucose, Bld: 132 mg/dL — ABNORMAL HIGH (ref 70–99)
Potassium: 4.5 mmol/L (ref 3.5–5.1)
Sodium: 132 mmol/L — ABNORMAL LOW (ref 135–145)
Total Bilirubin: 1.2 mg/dL (ref 0.3–1.2)
Total Protein: 6.8 g/dL (ref 6.5–8.1)

## 2020-12-28 LAB — RESP PANEL BY RT-PCR (FLU A&B, COVID) ARPGX2
Influenza A by PCR: NEGATIVE
Influenza B by PCR: NEGATIVE
SARS Coronavirus 2 by RT PCR: NEGATIVE

## 2020-12-28 LAB — HEMATOCRIT: HCT: 16.8 % — ABNORMAL LOW (ref 39.0–52.0)

## 2020-12-28 LAB — HEMOGLOBIN: Hemoglobin: 5.6 g/dL — ABNORMAL LOW (ref 13.0–17.0)

## 2020-12-28 MED ORDER — ONDANSETRON HCL 4 MG/2ML IJ SOLN: 4.0000 mg | Freq: Four times a day (QID) | INTRAMUSCULAR | Status: DC | PRN

## 2020-12-28 MED ORDER — ONDANSETRON HCL 4 MG PO TABS: 4.0000 mg | ORAL_TABLET | Freq: Four times a day (QID) | ORAL | Status: DC | PRN

## 2020-12-28 MED ORDER — HYDRALAZINE HCL 20 MG/ML IJ SOLN: 5.0000 mg | INTRAMUSCULAR | Status: DC | PRN

## 2020-12-28 MED ORDER — SODIUM CHLORIDE 0.9 % IV SOLN
10.0000 mL/h | Freq: Once | INTRAVENOUS | Status: AC
  Administered 2020-12-28: 10 mL/h via INTRAVENOUS

## 2020-12-28 MED ORDER — MORPHINE SULFATE (PF) 2 MG/ML IV SOLN: 2.0000 mg | INTRAVENOUS | Status: DC | PRN

## 2020-12-28 MED ORDER — INSULIN ASPART 100 UNIT/ML IJ SOLN
0.0000 [IU] | Freq: Three times a day (TID) | INTRAMUSCULAR | Status: DC
  Administered 2020-12-29 – 2020-12-30 (×2): 3 [IU] via SUBCUTANEOUS
  Administered 2020-12-30: 2 [IU] via SUBCUTANEOUS
  Administered 2020-12-31: 18:00:00 5 [IU] via SUBCUTANEOUS
  Administered 2020-12-31: 3 [IU] via SUBCUTANEOUS
  Administered 2020-12-31 – 2021-01-01 (×2): 5 [IU] via SUBCUTANEOUS
  Administered 2021-01-01 – 2021-01-02 (×3): 8 [IU] via SUBCUTANEOUS
  Administered 2021-01-02: 14:00:00 5 [IU] via SUBCUTANEOUS
  Administered 2021-01-02: 11:00:00 8 [IU] via SUBCUTANEOUS
  Administered 2021-01-03 – 2021-01-04 (×3): 5 [IU] via SUBCUTANEOUS
  Filled 2020-12-28 (×13): qty 1

## 2020-12-28 MED ORDER — LACTATED RINGERS IV BOLUS
1000.0000 mL | Freq: Once | INTRAVENOUS | Status: AC
  Administered 2020-12-28: 1000 mL via INTRAVENOUS

## 2020-12-28 MED ORDER — LACTATED RINGERS IV SOLN: INTRAVENOUS | Status: DC

## 2020-12-28 MED ORDER — ACETAMINOPHEN 650 MG RE SUPP: 650.0000 mg | Freq: Four times a day (QID) | RECTAL | Status: DC | PRN

## 2020-12-28 MED ORDER — ACETAMINOPHEN 325 MG PO TABS: 650.0000 mg | ORAL_TABLET | Freq: Four times a day (QID) | ORAL | Status: DC | PRN

## 2020-12-28 NOTE — ED Notes (Addendum)
Spoke w/ blood bank staff, will need 2 units for BT for this pt,  per blood bank , will have to request and order washed PRBC d/t Pt +antibodies. Consent for BT secured  Per blood bank staff, blood will be coming from [place].. no ETA given. Dr Posey Pronto made aware

## 2020-12-28 NOTE — ED Triage Notes (Signed)
Pt to the ED with c/o low hgb, he was at his doctor today and they took his blood. His HBG is 6.2 today, last time when he was here he had to get blood that was washed. He did have endoscopy and colonoscopy last Thursday and they were unable to find where he was bleeding at.

## 2020-12-28 NOTE — ED Notes (Signed)
physician at bedside.

## 2020-12-28 NOTE — ED Provider Notes (Signed)
[facility] Emergency Department Provider Note   ____________________________________________   Event Date/Time   First MD Initiated Contact with Patient 12/28/20 2050     (approximate)  I have reviewed the triage vital signs and the nursing notes.   HISTORY  Chief Complaint Abnormal Labs    HPI Patrick Duncan is a 55 y.o. male with past medical history of hypertension, hyperlipidemia, diabetes, OSA, and IgA deficiency who presents to the ED complaining of abnormal labs.  Patient reports that he had follow-up appointment with his PCP today following recent admission for anemia and GI bleed.  He was told that his hemoglobin was 6.2 and that he needed to go to the ER for transfusion and admission.  He states he has not noticed any blood in his stools recently and denies any dark tarry stool.  He denies any pain in his abdomen and has not had any nausea or vomiting.  He does report some discomfort in the right upper arm that is ongoing from the time of his recent admission.  He denies any lightheadedness, chest pain, or shortness of breath.  He does not take any blood thinners.        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[place])    Morbid obesity ([place])    Sleep apnea     Patient Active Problem List   Diagnosis Date Noted   Acute blood loss anemia 12/28/2020   Symptomatic anemia    GI bleed 12/21/2020   Cellulitis 10/22/2016   Diabetes ([place]) 10/22/2016   HTN (hypertension) 10/22/2016   HLD (hyperlipidemia) 10/22/2016   OSA (obstructive sleep apnea) 10/22/2016   AKI (acute kidney injury) ([place]) 10/22/2016    Past Surgical History:  Procedure Laterality Date   COLONOSCOPY WITH PROPOFOL N/A 07/13/2020   Procedure: COLONOSCOPY WITH PROPOFOL;   Surgeon: Lesly Rubenstein, MD;  Location: [facility] ENDOSCOPY;  Service: Endoscopy;  Laterality: N/A;   COLONOSCOPY WITH PROPOFOL N/A 12/23/2020   Procedure: COLONOSCOPY WITH PROPOFOL;  Surgeon: Lucilla Lame, MD;  Location: [facility] ENDOSCOPY;  Service: Endoscopy;  Laterality: N/A;   ESOPHAGOGASTRODUODENOSCOPY (EGD) WITH PROPOFOL N/A 12/23/2020   Procedure: ESOPHAGOGASTRODUODENOSCOPY (EGD) WITH PROPOFOL;  Surgeon: Lucilla Lame, MD;  Location[facility] ENDOSCOPY;  Service: Endoscopy;  Laterality: N/A;   TOOTH EXTRACTION      Prior to Admission medications   Medication Sig Start Date End Date Taking? Authorizing Provider  acarbose (PRECOSE) 25 MG tablet Take 25 mg by mouth 3 (three) times daily with meals.    [provider]  acetaminophen (TYLENOL) 650 MG CR tablet Take 650 mg by mouth every 8 (eight) hours as needed for pain.    [provider]  amLODipine (NORVASC) 10 MG tablet Take 10 mg by mouth daily. 05/18/16   [provider]  dorzolamide-timolol (COSOPT) 22.3-6.8 MG/ML ophthalmic solution Place 1 drop into the right eye 2 (two) times daily.    [provider]  FARXIGA 10 MG TABS tablet Take 10 mg by mouth daily. 12/13/20   [provider]  fenofibrate 160 MG tablet Take 160 mg by mouth daily. 07/25/17   [provider]  ferrous sulfate 325 (65 FE) MG EC tablet Take 1 tablet (325  mg total) by mouth 2 (two) times daily. 12/23/20 12/23/21  [name], MD  fexofenadine (ALLEGRA) 180 MG tablet Take 180 mg by mouth daily.    [provider]  fluticasone (FLONASE) 50 MCG/ACT nasal spray Place 2 sprays into both nostrils daily as needed. 05/04/16   [provider]  HIBICLENS 4 % external liquid APPLY TOPICALLY DAILY AS NEEDED. TO [place] AREA 12/06/20   Ralene Bathe, MD  Hydrocortisone-Aloe 1 % CREA Apply 1 application topically in the morning and at bedtime. To itchy area on legs    [provider]  ketoconazole  (NIZORAL) 2 % cream Apply to rash in groin once a day as needed for flares. 09/11/19   Ralene Bathe, MD  lidocaine (LMX) 4 % cream Apply 1 application topically as needed.    [provider]  lovastatin (MEVACOR) 10 MG tablet Take 10 mg by mouth daily. 08/12/19   [provider]  metFORMIN (GLUCOPHAGE-XR) 500 MG 24 hr tablet Take 500-1,000 mg by mouth 2 (two) times daily. Takes 1 tablet with breakfast and 2 tablets with supper 07/31/16   [provider]  metoprolol tartrate (LOPRESSOR) 50 MG tablet Take 50 mg by mouth 2 (two) times daily. 08/23/16   [provider]  mupirocin ointment (BACTROBAN) 2 % Apply to boils and ulcers QD PRN flares. 12/21/20   Ralene Bathe, MD  pioglitazone (ACTOS) 30 MG tablet Take 30 mg by mouth daily. 07/27/16   [provider]  ramipril (ALTACE) 10 MG capsule Take 10 mg by mouth 2 (two) times daily. 05/18/16   [provider]  spironolactone (ALDACTONE) 50 MG tablet Take 50 mg by mouth daily.    [provider]  terazosin (HYTRIN) 10 MG capsule Take 10 mg by mouth daily.  05/18/16   [provider]  torsemide (DEMADEX) 20 MG tablet Take 20 mg by mouth daily.    [provider]  traZODone (DESYREL) 150 MG tablet Take 150 mg by mouth at bedtime. 09/06/16   [provider]  TRESIBA FLEXTOUCH 200 UNIT/ML FlexTouch Pen Inject 30 Units into the skin in the morning. 12/17/20   [provider]  trolamine salicylate (ASPERCREME) 10 % cream Apply 1 application topically as needed for muscle pain.    [provider]  vitamin B-12 (CYANOCOBALAMIN) 1000 MCG tablet Take 1,000 mcg by mouth daily.    [provider]    Allergies Ampicillin, Bactrim [sulfamethoxazole-trimethoprim], Cephalexin, Claritin [loratadine], and Penicillins  Family History  Problem Relation Age of Onset   Benign prostatic hyperplasia Father    Psoriasis Father    Hypertension Mother     Hyperlipidemia Mother    Diabetes Maternal Grandmother    Diabetes Paternal Grandmother     Social History Social History   Tobacco Use   Smoking status: Never   Smokeless tobacco: Never  Vaping Use   Vaping Use: Never used  Substance Use Topics   Alcohol use: No   Drug use: No    Review of Systems  Constitutional: No fever/chills Eyes: No visual changes. ENT: No sore throat. Cardiovascular: Denies chest pain. Respiratory: Denies shortness of breath. Gastrointestinal: No abdominal pain.  No nausea, no vomiting.  No diarrhea.  No constipation. Genitourinary: Negative for dysuria. Musculoskeletal: Negative for back pain.  Positive for right arm pain. Skin: Negative for rash. Neurological: Negative for headaches, focal weakness or numbness.  ____________________________________________   PHYSICAL EXAM:  VITAL SIGNS: ED Triage Vitals  Enc  Vitals Group     BP 12/28/20 1825 (!) 110/30     Pulse Rate 12/28/20 1825 91     Resp 12/28/20 1825 20     Temp 12/28/20 1825 98.4 F (36.9 C)     Temp Source 12/28/20 1825 Oral     SpO2 12/28/20 1825 98 %     Weight 12/28/20 1826 (!) 349 lb (158.3 kg)     Height 12/28/20 1826 5\' 7"  (1.702 m)     Head Circumference --      Peak Flow --      Pain Score 12/28/20 1826 5     Pain Loc --      Pain Edu? --      Excl. in [place]? --     Constitutional: Alert and oriented. Eyes: Conjunctivae are normal. Head: Atraumatic. Nose: No congestion/rhinnorhea. Mouth/Throat: Mucous membranes are moist. Neck: Normal ROM Cardiovascular: Normal rate, regular rhythm. Grossly normal heart sounds.  2+ radial pulses bilaterally. Respiratory: Normal respiratory effort.  No retractions. Lungs CTAB. Gastrointestinal: Soft and nontender. No distention.  Rectal exam with light brown guaiac-negative stool. Genitourinary: deferred Musculoskeletal: No lower extremity tenderness nor edema.  Tenderness to palpation over right biceps with small area of  erythema proximally. Neurologic:  Normal speech and language. No gross focal neurologic deficits are appreciated. Skin:  Skin is warm, dry and intact. No rash noted. Psychiatric: Mood and affect are normal. Speech and behavior are normal.  ____________________________________________   LABS (all labs ordered are listed, but only abnormal results are displayed)  Labs Reviewed  CBC WITH DIFFERENTIAL/PLATELET - Abnormal; Notable for the following components:      Result Value   WBC 2.1 (*)    RBC 1.75 (*)    Hemoglobin 5.8 (*)    HCT 17.0 (*)    RDW 18.4 (*)    Lymphs Abs 0.2 (*)    All other components within normal limits  COMPREHENSIVE METABOLIC PANEL - Abnormal; Notable for the following components:   Sodium 132 (*)    Glucose, Bld 132 (*)    BUN 92 (*)    Creatinine, Ser 2.06 (*)    Calcium 8.8 (*)    Alkaline Phosphatase 35 (*)    GFR, Estimated 38 (*)    All other components within normal limits  RESP PANEL BY RT-PCR (FLU A&B, COVID) ARPGX2  CBC  HEMOGLOBIN  HEMOGLOBIN  HEMATOCRIT  HEMATOCRIT  COMPREHENSIVE METABOLIC PANEL  TYPE AND SCREEN  PREPARE RBC (CROSSMATCH)  PREPARE RBC (CROSSMATCH)   ____________________________________________  EKG  ED ECG REPORT I, Blake Divine, the attending physician, personally viewed and interpreted this ECG.   Date: 12/28/2020  EKG Time: 21:48  Rate: 85  Rhythm: normal sinus rhythm  Axis: Normal  Intervals:none  ST&T Change: None   PROCEDURES  Procedure(s) performed (including Critical Care):  .Critical Care Performed by: Blake Divine, MD Authorized by: Blake Divine, MD   Critical care provider statement:    Critical care time (minutes):  30   Critical care time was exclusive of:  Separately billable procedures and treating other patients and teaching time   Critical care was necessary to treat or prevent imminent or life-threatening deterioration of the following conditions:  Circulatory failure    Critical care was time spent personally by me on the following activities:  Development of treatment plan with patient or surrogate, discussions with consultants, evaluation of patient's response to treatment, examination of patient, ordering and review of laboratory studies, ordering and review of  radiographic studies, ordering and performing treatments and interventions, pulse oximetry, re-evaluation of patient's condition and review of old charts   I assumed direction of critical care for this patient from another provider in my specialty: no     Care discussed with: admitting provider     ____________________________________________   INITIAL IMPRESSION / ASSESSMENT AND PLAN / ED COURSE      55 year old male with past medical history of hypertension, hyperlipidemia, diabetes, OSA, and IgA deficiency who presents to the ED with worsening hemoglobin on outpatient labs following recent admission for GI bleed.  During his recent admission, he had endoscopy and colonoscopy that were not able to identify source of bleeding, no apparent GI bleed today as stool is guaiac negative.  He has no abdominal tenderness, only complains of tenderness in his right upper arm where there is some mild erythema.  This does not appear consistent with a cellulitis but we will further assess with ultrasound for DVT.  No chest pain or shortness of breath to suggest PE at this time.  Repeat hemoglobin is now at 5.8 and we will transfuse 2 units of PRBCs.  He will require washed red blood cells given his history of IgA deficiency.  Patient remains hemodynamically stable, Case discussed with hospitalist for admission      ____________________________________________   FINAL CLINICAL IMPRESSION(S) / ED DIAGNOSES  Final diagnoses:  Arm swelling  Anemia, unspecified type     ED Discharge Orders     None        Note:  This document was prepared using Dragon voice recognition software and may include  unintentional dictation errors.    Blake Divine, MD 12/28/20 2330

## 2020-12-28 NOTE — H&P (Signed)
History and Physical    ADMIR CANDELAS JWJ:[id number] DOB: 1965-02-07 DOA: 12/28/2020  PCP: Sofie Hartigan, MD    Patient coming from:  Home   Chief Complaint:  Hemoglobin of 6.2.   HPI:  Patrick Duncan is a 55 y.o. male seen in ed with complaints of hemoglobin of 6.2.  Patient coming to [place] from home/ pcp office.  Patient was recently admitted on 12/21/2020 with bright red blood per rectum patient does use Aleve which I have asked him to refrain from using Aleve and any NSAIDs. Patient states that other than weakness he was actually doing well and was not feeling bad in any way.  Patient denies any headaches blurred vision chest pain shortness of breath nausea vomiting.  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  Pt has past medical history of arthritis, cellulitis, depression, diabetes mellitus type 2, hypertension, IgA deficiency, morbid obesity, sleep apnea, hyperlipidemia.  Patient was admitted on 12 -6-22 fro anemia and had transfusion and egd and colonoscopy.  ED Course:  Vitals:   12/28/20 2130 12/28/20 2200 12/28/20 2230 12/28/20 2300  BP: (!) 107/43 (!) 105/37 95/66 (!) 116/55  Pulse: 84 86 87 89  Resp: 16 15 13 17   Temp: 98.1 F (36.7 C)     TempSrc: Oral     SpO2: 97% 98% 100% 100%  Weight:      Height:      In the emergency room patient is alert awake and oriented oxygenating above 90% on room air. Initial CMP shows a creatinine of 2.08 with glucose of 132, BUN of 92, calcium of 8.8, white count of 2.1 hemoglobin of 5.8 platelets within normal limits. Pt ordered 2 units PRBC.   Review of Systems:  Review of Systems  Constitutional:  Positive for malaise/fatigue.  Neurological:  Positive for weakness.  All other systems reviewed and are negative.   Past Medical History:  Diagnosis Date   Arthritis    Cellulitis and abscess of left leg    Depression     Diabetes mellitus without complication (HCC)    Hearing loss    History of IBS    HLD (hyperlipidemia)    Hypertension    IgA deficiency ([place])    Morbid obesity ([name])    Sleep apnea     Past Surgical History:  Procedure Laterality Date   COLONOSCOPY WITH PROPOFOL N/A 07/13/2020   Procedure: COLONOSCOPY WITH PROPOFOL;  Surgeon: Lesly Rubenstein, MD;  Location: [facility] ENDOSCOPY;  Service: Endoscopy;  Laterality: N/A;   COLONOSCOPY WITH PROPOFOL N/A 12/23/2020   Procedure: COLONOSCOPY WITH PROPOFOL;  Surgeon: Lucilla Lame, MD;  Location: [facility] ENDOSCOPY;  Service: Endoscopy;  Laterality: N/A;   ESOPHAGOGASTRODUODENOSCOPY (EGD) WITH PROPOFOL N/A 12/23/2020   Procedure: ESOPHAGOGASTRODUODENOSCOPY (EGD) WITH PROPOFOL;  Surgeon: Lucilla Lame, MD;  Location: [facility] ENDOSCOPY;  Service: Endoscopy;  Laterality: N/A;   TOOTH EXTRACTION       reports that he has never smoked. He has never used smokeless tobacco. He reports that he does not drink alcohol and does not use drugs.  Allergies  Allergen Reactions   Ampicillin Diarrhea   Bactrim [Sulfamethoxazole-Trimethoprim] Hives   Cephalexin    Claritin [Loratadine] Other (See Comments)    Irritates throat   Penicillins Rash    Family History  Problem Relation Age of Onset   Benign prostatic hyperplasia Father    Psoriasis Father    Hypertension Mother  Hyperlipidemia Mother    Diabetes Maternal Grandmother    Diabetes Paternal Grandmother     Prior to Admission medications   Medication Sig Start Date End Date Taking? Authorizing Provider  acarbose (PRECOSE) 25 MG tablet Take 25 mg by mouth 3 (three) times daily with meals.    [provider]  acetaminophen (TYLENOL) 650 MG CR tablet Take 650 mg by mouth every 8 (eight) hours as needed for pain.    [provider]  amLODipine (NORVASC) 10 MG tablet Take 10 mg by mouth daily. 05/18/16   [provider]  dorzolamide-timolol (COSOPT) 22.3-6.8 MG/ML ophthalmic  solution Place 1 drop into the right eye 2 (two) times daily.    [provider]  FARXIGA 10 MG TABS tablet Take 10 mg by mouth daily. 12/13/20   [provider]  fenofibrate 160 MG tablet Take 160 mg by mouth daily. 07/25/17   [provider]  ferrous sulfate 325 (65 FE) MG EC tablet Take 1 tablet (325 mg total) by mouth 2 (two) times daily. 12/23/20 12/23/21  [name], MD  fexofenadine (ALLEGRA) 180 MG tablet Take 180 mg by mouth daily.    [provider]  fluticasone (FLONASE) 50 MCG/ACT nasal spray Place 2 sprays into both nostrils daily as needed. 05/04/16   [provider]  HIBICLENS 4 % external liquid APPLY TOPICALLY DAILY AS NEEDED. TO [place] AREA 12/06/20   Ralene Bathe, MD  Hydrocortisone-Aloe 1 % CREA Apply 1 application topically in the morning and at bedtime. To itchy area on legs    [provider]  ketoconazole (NIZORAL) 2 % cream Apply to rash in groin once a day as needed for flares. 09/11/19   Ralene Bathe, MD  lidocaine (LMX) 4 % cream Apply 1 application topically as needed.    [provider]  lovastatin (MEVACOR) 10 MG tablet Take 10 mg by mouth daily. 08/12/19   [provider]  metFORMIN (GLUCOPHAGE-XR) 500 MG 24 hr tablet Take 500-1,000 mg by mouth 2 (two) times daily. Takes 1 tablet with breakfast and 2 tablets with supper 07/31/16   [provider]  metoprolol tartrate (LOPRESSOR) 50 MG tablet Take 50 mg by mouth 2 (two) times daily. 08/23/16   [provider]  mupirocin ointment (BACTROBAN) 2 % Apply to boils and ulcers QD PRN flares. 12/21/20   Ralene Bathe, MD  pioglitazone (ACTOS) 30 MG tablet Take 30 mg by mouth daily. 07/27/16   [provider]  ramipril (ALTACE) 10 MG capsule Take 10 mg by mouth 2 (two) times daily. 05/18/16   [provider]  spironolactone (ALDACTONE) 50 MG tablet Take 50 mg by mouth daily.    [provider]   terazosin (HYTRIN) 10 MG capsule Take 10 mg by mouth daily.  05/18/16   [provider]  torsemide (DEMADEX) 20 MG tablet Take 20 mg by mouth daily.    [provider]  traZODone (DESYREL) 150 MG tablet Take 150 mg by mouth at bedtime. 09/06/16   [provider]  TRESIBA FLEXTOUCH 200 UNIT/ML FlexTouch Pen Inject 30 Units into the skin in the morning. 12/17/20   [provider]  trolamine salicylate (ASPERCREME) 10 % cream Apply 1 application topically as needed for muscle pain.    [provider]  vitamin B-12 (CYANOCOBALAMIN) 1000 MCG tablet Take 1,000 mcg by mouth daily.    [provider]    Physical Exam: Vitals:   12/28/20 2130  12/28/20 2200 12/28/20 2230 12/28/20 2300  BP: (!) 107/43 (!) 105/37 95/66 (!) 116/55  Pulse: 84 86 87 89  Resp: 16 15 13 17   Temp: 98.1 F (36.7 C)     TempSrc: Oral     SpO2: 97% 98% 100% 100%  Weight:      Height:       Physical Exam Vitals reviewed.  Constitutional:      General: He is not in acute distress.    Appearance: Normal appearance. He is obese. He is not ill-appearing.  HENT:     Head: Normocephalic and atraumatic.     Right Ear: External ear normal.     Left Ear: External ear normal.     Nose: Nose normal.     Mouth/Throat:     Mouth: Mucous membranes are dry.  Eyes:     Extraocular Movements: Extraocular movements intact.     Pupils: Pupils are equal, round, and reactive to light.  Neck:     Vascular: No carotid bruit.  Cardiovascular:     Rate and Rhythm: Normal rate and regular rhythm.     Pulses: Normal pulses.     Heart sounds: Normal heart sounds.  Pulmonary:     Effort: Pulmonary effort is normal.     Breath sounds: Normal breath sounds.  Abdominal:     General: Bowel sounds are normal. There is no distension.     Palpations: Abdomen is soft.     Tenderness: There is no abdominal tenderness. There is no guarding.  Musculoskeletal:     Right lower leg: No edema.      Left lower leg: No edema.  Skin:    General: Skin is warm.  Neurological:     General: No focal deficit present.     Mental Status: He is alert and oriented to person, place, and time.  Psychiatric:        Mood and Affect: Mood normal.        Behavior: Behavior normal.     Labs on Admission: I have personally reviewed following labs and imaging studies  No results for input(s): CKTOTAL, CKMB, TROPONINI in the last 72 hours. Lab Results  Component Value Date   WBC 2.1 (L) 12/28/2020   HGB 5.8 (L) 12/28/2020   HCT 17.0 (L) 12/28/2020   MCV 97.1 12/28/2020   PLT 255 12/28/2020    Recent Labs  Lab 12/28/20 2116  NA 132*  K 4.5  CL 103  CO2 22  BUN 92*  CREATININE 2.06*  CALCIUM 8.8*  PROT 6.8  BILITOT 1.2  ALKPHOS 35*  ALT 17  AST 20  GLUCOSE 132*   No results found for: CHOL, HDL, LDLCALC, TRIG No results found for: DDIMER Invalid input(s): POCBNP   COVID-19 Labs No results for input(s): DDIMER, FERRITIN, LDH, CRP in the last 72 hours. Lab Results  Component Value Date   [place] NEGATIVE 12/28/2020   [place] NEGATIVE 12/21/2020    Radiological Exams on Admission: US Venous Img Upper Uni Right(DVT)  Result Date: 12/28/2020 CLINICAL DATA:  Right arm swelling EXAM: RIGHT UPPER EXTREMITY VENOUS DOPPLER ULTRASOUND TECHNIQUE: Gray-scale sonography with graded compression, as well as color Doppler and duplex ultrasound were performed to evaluate the upper extremity deep venous system from the level of the subclavian vein and including the jugular, axillary, basilic, radial, ulnar and upper cephalic vein. Spectral Doppler was utilized to evaluate flow at rest and with distal augmentation maneuvers. COMPARISON:  None. FINDINGS: Contralateral Subclavian Vein:  Respiratory phasicity is normal and symmetric with the symptomatic side. No evidence of thrombus. Normal compressibility. Internal Jugular Vein: No evidence of thrombus. Normal compressibility, respiratory  phasicity and response to augmentation. Subclavian Vein: No evidence of thrombus. Normal compressibility, respiratory phasicity and response to augmentation. Axillary Vein: No evidence of thrombus. Normal compressibility, respiratory phasicity and response to augmentation. Cephalic Vein: Occlusive thrombus seen in the left cephalic vein from the shoulder to the antecubital region. Basilic Vein: No evidence of thrombus. Normal compressibility, respiratory phasicity and response to augmentation. Brachial Veins: No evidence of thrombus. Normal compressibility, respiratory phasicity and response to augmentation. Radial Veins: No evidence of thrombus. Normal compressibility, respiratory phasicity and response to augmentation. Ulnar Veins: No evidence of thrombus. Normal compressibility, respiratory phasicity and response to augmentation. Venous Reflux:  None visualized. Other Findings:  None visualized. IMPRESSION: Occlusive thrombus in the right upper arm cephalic vein from the shoulder to the elbow. Electronically Signed   By: Rolm Baptise M.D.   On: 12/28/2020 22:44    EKG: Independently reviewed.  SR 85 and q wave in lead III.  SCDs Assessment/Plan: Principal Problem:   Acute blood loss anemia Active Problems:   GI bleed   HTN (hypertension)   Diabetes (HCC)   AKI (acute kidney injury) (HCC)   OSA (obstructive sleep apnea)   Acute blood loss anemia/GI bleed:  Source of bleeding not clear currently. CT angio once creatinine is normal. GI consult message sent to Dr. Marius Ditch. Type and cross 2 units PRBC is pending as patient needs it washed RBCs.  Hypertension: Blood pressure (!) 116/55, pulse 89, temperature 98.1 F (36.7 C), temperature source Oral, resp. rate 17, height 5\' 7"  (1.702 m), weight (!) 158.3 kg, SpO2 100 %. Will discontinue HCTZ. Monitor for metoprolol.  Diabetes mellitus type 2: Tell her that we have modest sliding scale insulin, glycemic protocol.  Acute kidney  injury: Avoid contrast study, administer half liter bolus in edition Lab Results  Component Value Date   CREATININE 2.06 (H) 12/28/2020   CREATININE 1.08 12/23/2020   CREATININE 1.38 (H) 12/22/2020   Obstructive sleep apnea: Continue with home settings and CPAP/AutoPap titration.  DVT prophylaxis:  SCDs  Code Status:  Full code  Family Communication:  Brooker,Allison (Spouse)  [phone] (Mobile)   Disposition Plan:  Home  Consults called:  None  Admission status: Inpatient   Para Skeans MD [facility] [phone] How to contact the [facility] Attending or Consulting provider [place] or covering provider during after hours [place], for this patient.    Check the care team in [facility] and look for a) attending/consulting [facility] provider listed and b) the [facility] team listed Log into [email or web address] and use 's universal password to access. If you do not have the password, please contact the hospital operator. Locate the [facility] provider you are looking for under [facility] and page to a number that you can be directly reached. If you still have difficulty reaching the provider, please page the [facility] (Director on Call) for the Hospitalists listed on amion for assistance. [email or web address] Password [facility] 12/28/2020, 11:33 PM

## 2020-12-29 ENCOUNTER — Other Ambulatory Visit: Payer: Self-pay

## 2020-12-29 ENCOUNTER — Encounter
Admission: EM | Disposition: A | Discharge status: Home / Self Care | Payer: Self-pay | Source: Home / Self Care | Attending: Internal Medicine

## 2020-12-29 DIAGNOSIS — D62 Acute posthemorrhagic anemia: Secondary | ICD-10-CM | POA: Diagnosis present

## 2020-12-29 DIAGNOSIS — D500 Iron deficiency anemia secondary to blood loss (chronic): Secondary | ICD-10-CM

## 2020-12-29 HISTORY — PX: GIVENS CAPSULE STUDY: SHX5432

## 2020-12-29 LAB — CBG MONITORING, ED
Glucose-Capillary: 121 mg/dL — ABNORMAL HIGH (ref 70–99)
Glucose-Capillary: 109 mg/dL — ABNORMAL HIGH (ref 70–99)

## 2020-12-29 LAB — RETIC PANEL
Immature Retic Fract: 12.7 % (ref 2.3–15.9)
RBC.: 1.65 MIL/uL — ABNORMAL LOW (ref 4.22–5.81)
Retic Count, Absolute: 45.9 10*3/uL (ref 19.0–186.0)
Retic Ct Pct: 2.8 % (ref 0.4–3.1)
Reticulocyte Hemoglobin: 37 pg (ref >27.9)

## 2020-12-29 LAB — COMPREHENSIVE METABOLIC PANEL
ALT: 17 U/L (ref 0–44)
AST: 21 U/L (ref 15–41)
Albumin: 3.4 g/dL — ABNORMAL LOW (ref 3.5–5.0)
Alkaline Phosphatase: 33 U/L — ABNORMAL LOW (ref 38–126)
Anion gap: 7 (ref 5–15)
BUN: 83 mg/dL — ABNORMAL HIGH (ref 6–20)
CO2: 22 mmol/L (ref 22–32)
Calcium: 8.7 mg/dL — ABNORMAL LOW (ref 8.9–10.3)
Chloride: 105 mmol/L (ref 98–111)
Creatinine, Ser: 1.55 mg/dL — ABNORMAL HIGH (ref 0.61–1.24)
GFR, Estimated: 53 mL/min — ABNORMAL LOW (ref >60)
Glucose, Bld: 110 mg/dL — ABNORMAL HIGH (ref 70–99)
Potassium: 4.1 mmol/L (ref 3.5–5.1)
Sodium: 134 mmol/L — ABNORMAL LOW (ref 135–145)
Total Bilirubin: 1.2 mg/dL (ref 0.3–1.2)
Total Protein: 6.4 g/dL — ABNORMAL LOW (ref 6.5–8.1)

## 2020-12-29 LAB — CBC
HCT: 16.4 % — ABNORMAL LOW (ref 39.0–52.0)
HCT: 21 % — ABNORMAL LOW (ref 39.0–52.0)
Hemoglobin: 5.5 g/dL — ABNORMAL LOW (ref 13.0–17.0)
Hemoglobin: 7.3 g/dL — ABNORMAL LOW (ref 13.0–17.0)
MCH: 32.7 pg (ref 26.0–34.0)
MCH: 32.2 pg (ref 26.0–34.0)
MCHC: 33.5 g/dL (ref 30.0–36.0)
MCHC: 34.8 g/dL (ref 30.0–36.0)
MCV: 97.6 fL (ref 80.0–100.0)
MCV: 92.5 fL (ref 80.0–100.0)
Platelets: 250 10*3/uL (ref 150–400)
Platelets: 286 10*3/uL (ref 150–400)
RBC: 1.68 MIL/uL — ABNORMAL LOW (ref 4.22–5.81)
RBC: 2.27 MIL/uL — ABNORMAL LOW (ref 4.22–5.81)
RDW: 18.4 % — ABNORMAL HIGH (ref 11.5–15.5)
RDW: 18.7 % — ABNORMAL HIGH (ref 11.5–15.5)
WBC: 1.5 10*3/uL — ABNORMAL LOW (ref 4.0–10.5)
WBC: 2.2 10*3/uL — ABNORMAL LOW (ref 4.0–10.5)
nRBC: 0 % (ref 0.0–0.2)
nRBC: 0 % (ref 0.0–0.2)

## 2020-12-29 LAB — GLUCOSE, CAPILLARY
Glucose-Capillary: 97 mg/dL (ref 70–99)
Glucose-Capillary: 180 mg/dL — ABNORMAL HIGH (ref 70–99)
Glucose-Capillary: 125 mg/dL — ABNORMAL HIGH (ref 70–99)

## 2020-12-29 LAB — PREPARE RBC (CROSSMATCH)

## 2020-12-29 SURGERY — IMAGING PROCEDURE, GI TRACT, INTRALUMINAL, VIA CAPSULE

## 2020-12-29 MED ORDER — PANTOPRAZOLE SODIUM 40 MG IV SOLR
40.0000 mg | Freq: Two times a day (BID) | INTRAVENOUS | Status: DC
  Administered 2020-12-29 – 2021-01-04 (×13): 40 mg via INTRAVENOUS
  Filled 2020-12-29 (×13): qty 40

## 2020-12-29 MED ORDER — DIPHENHYDRAMINE HCL 50 MG/ML IJ SOLN
25.0000 mg | Freq: Four times a day (QID) | INTRAMUSCULAR | Status: DC | PRN
  Administered 2020-12-29 – 2021-01-01 (×3): 25 mg via INTRAVENOUS
  Filled 2020-12-29 (×3): qty 1

## 2020-12-29 MED ORDER — TORSEMIDE 20 MG PO TABS
20.0000 mg | ORAL_TABLET | Freq: Every day | ORAL | Status: DC
  Administered 2020-12-29 – 2021-01-04 (×7): 20 mg via ORAL
  Filled 2020-12-29 (×7): qty 1

## 2020-12-29 MED ORDER — METOPROLOL TARTRATE 50 MG PO TABS
50.0000 mg | ORAL_TABLET | Freq: Two times a day (BID) | ORAL | Status: DC
  Administered 2020-12-29 – 2021-01-04 (×12): 50 mg via ORAL
  Filled 2020-12-29 (×12): qty 1

## 2020-12-29 NOTE — ED Notes (Signed)
Pt up to [facility]. Pt had large BM and urine output. Pt assited with wiping per pt he can't do it. Pt back in bed and back on monitor. Pt call light within reach.

## 2020-12-29 NOTE — Care Management (Signed)
Patient hospitalized for blood transfusions, he was discharged after having the same issue last week.  TOC attempted to see him, but he was receiving care today, will see patient tomorrow.

## 2020-12-29 NOTE — ED Notes (Signed)
Pt transported to floor to 130 at this time by EDT Abigail.

## 2020-12-29 NOTE — Progress Notes (Signed)
Nutrition Brief Note  RD consulted for assessment of nutritional requirements/ status secondary to obesity.   Wt Readings from Last 15 Encounters:  12/28/20 (!) 158.3 kg  12/14/20 (!) 158.3 kg  07/13/20 (!) 158.8 kg  03/16/18 (!) 158.8 kg  03/14/18 (!) 158.8 kg  10/22/16 (!) 149.7 kg   Pt admitted with acute on chronic anemia and suspected GI bleed.   Reviewed I/O's: +1 L x 24 hours  Pt unavailable at time of visit. Attempted to speak with pt via call to hospital room phone, however, unable to reach. RD unable to obtain further nutrition-related history or complete nutrition-focused physical exam at this time.    Medications reviewed and include demadex.   Lab Results  Component Value Date   HGBA1C 9.4 (H) 12/21/2020   PTA DM medications are 4 mg amaryl daily, 500 mg metformin daily, 1000 mg metformin daily, and 30 units tresiba daily.   Labs reviewed: Na: 134, CBGS: 97-121 (inpatient orders for glycemic control are 0-15 units insulin aspart TID with meals).    Body mass index is 54.66 kg/m. Patient meets criteria for extreme obesity class III based on current BMI. Obesity is a complex, chronic medical condition that is optimally managed by a multidisciplinary care team. Weight loss is not an ideal goal for an acute inpatient hospitalization. However, if further work-up for obesity is warranted, consider outpatient referral to outpatient bariatric service and/or 's Nutrition and Diabetes Education Services.    Current diet order is clear liquids, patient is consuming approximately n/a% of meals at this time. Labs and medications reviewed.   No nutrition interventions warranted at this time. If nutrition issues arise, please consult RD.   Loistine Chance, RD, LDN, [place] Registered Dietitian II Certified Diabetes Care and Education Specialist Please refer to [facility] for RD and/or RD on-call/weekend/after hours pager

## 2020-12-29 NOTE — ED Notes (Addendum)
Pt  advised still waiting for blood to be available at blood bank. Pt requested not to get up as noted with hypotension, pt asymptomatic  at this time.  Pt advised for safety to use urinal for time being  so as not to run the risk of  LOC/ fall  when getting up

## 2020-12-29 NOTE — Consult Note (Signed)
Patrick Darby, MD [address]  [place]  [place], [place] [address]  Main: [phone]  Fax: [phone] Pager: [phone]   Consultation  Referring Provider:     No ref. provider found Primary Care Physician:  Patrick Hartigan, MD Primary Gastroenterologist:  Dr. Haig Duncan    Reason for Consultation:     Acute on chronic anemia  Date of Admission:  12/28/2020 Date of Consultation:  12/29/2020         HPI:   Patrick Duncan is a 55 y.o. male history of metabolic syndrome, IgA deficiency is admitted with acute on chronic anemia.  He was found to have hemoglobin of 6.2 as reported from his PCPs office and he was advised to go to ER.  Patient was recently admitted to [facility] on 12/21/2020 secondary to rectal bleeding in setting of NSAID use.  He underwent upper endoscopy and colonoscopy which were unremarkable.  His hemoglobin was 8.4 on day of discharge 12/8.  Other than generalized weakness, patient denies any rectal bleeding, melena, hematemesis, coffee-ground emesis.  His hemoglobin is 5.5 today with severe leukopenia.  Patient is waiting for irradiated PRBC transfusion.  His renal function is worsening compared to baseline, T bili 1.2.  His iron studies revealed ferritin 232, normal folate levels Patient has history of chronic normocytic anemia, hemoglobin 11.7 in 10/2016, dropped to 8.2 on 11/2020 when he was admitted to [facility]  Patient is hemodynamically stable, oxygenating well on room air.  Patient's wife is bedside.  She also denied any recurrence of rectal bleeding since recent hospital discharge  NSAIDs: None since last discharge  Antiplts/Anticoagulants/Anti thrombotics: None  GI Procedures: EGD and colonoscopy 12/23/2020 - Normal esophagus. - Normal stomach. - Normal examined duodenum. - No specimens collected.  - Non-bleeding internal hemorrhoids. - No specimens collected.  Past Medical History:  Diagnosis Date   Arthritis    Cellulitis and abscess  of left leg    Depression    Diabetes mellitus without complication (HCC)    Hearing loss    History of IBS    HLD (hyperlipidemia)    Hypertension    IgA deficiency ([place])    Morbid obesity ([place])    Sleep apnea     Past Surgical History:  Procedure Laterality Date   COLONOSCOPY WITH PROPOFOL N/A 07/13/2020   Procedure: COLONOSCOPY WITH PROPOFOL;  Surgeon: Lesly Rubenstein, MD;  Location: [facility] ENDOSCOPY;  Service: Endoscopy;  Laterality: N/A;   COLONOSCOPY WITH PROPOFOL N/A 12/23/2020   Procedure: COLONOSCOPY WITH PROPOFOL;  Surgeon: Lucilla Lame, MD;  Location: [facility] ENDOSCOPY;  Service: Endoscopy;  Laterality: N/A;   ESOPHAGOGASTRODUODENOSCOPY (EGD) WITH PROPOFOL N/A 12/23/2020   Procedure: ESOPHAGOGASTRODUODENOSCOPY (EGD) WITH PROPOFOL;  Surgeon: Lucilla Lame, MD;  Location: [facility] ENDOSCOPY;  Service: Endoscopy;  Laterality: N/A;   TOOTH EXTRACTION      Prior to Admission medications   Medication Sig Start Date End Date Taking? Authorizing Provider  acarbose (PRECOSE) 25 MG tablet Take 25 mg by mouth 3 (three) times daily with meals.   Yes [provider]  amLODipine (NORVASC) 10 MG tablet Take 10 mg by mouth daily. 05/18/16  Yes [provider]  dorzolamide-timolol (COSOPT) 22.3-6.8 MG/ML ophthalmic solution Place 1 drop into the right eye 2 (two) times daily.   Yes [provider]  FARXIGA 10 MG TABS tablet Take 10 mg by mouth daily. 12/13/20  Yes [provider]  fenofibrate 160 MG tablet Take 160 mg by mouth daily. 07/25/17  Yes [provider]  ferrous sulfate 325 (65 FE) MG EC tablet Take 1 tablet (325 mg total) by mouth 2 (two) times daily. 12/23/20 12/23/21 Yes [name], MD  fexofenadine (ALLEGRA) 180 MG tablet Take 180 mg by mouth daily.   Yes [provider]  fluticasone (FLONASE) 50 MCG/ACT nasal spray Place 2 sprays into both nostrils daily as needed. 05/04/16  Yes [provider]  glimepiride (AMARYL) 4 MG  tablet Take 4 mg by mouth daily with breakfast. 12/24/20  Yes [provider]  HIBICLENS 4 % external liquid APPLY TOPICALLY DAILY AS NEEDED. TO [place] AREA 12/06/20  Yes Ralene Bathe, MD  Hydrocortisone-Aloe 1 % CREA Apply 1 application topically 2 (two) times daily as needed (itching). To itchy area on legs   Yes [provider]  ketoconazole (NIZORAL) 2 % cream Apply to rash in groin once a day as needed for flares. 09/11/19  Yes Ralene Bathe, MD  lidocaine (LMX) 4 % cream Apply 1 application topically as needed.   Yes [provider]  lovastatin (MEVACOR) 10 MG tablet Take 10 mg by mouth daily. 08/12/19  Yes [provider]  metFORMIN (GLUCOPHAGE-XR) 500 MG 24 hr tablet Take 500-1,000 mg by mouth 2 (two) times daily. Takes 1 tablet with breakfast and 2 tablets with supper 07/31/16  Yes [provider]  metoprolol tartrate (LOPRESSOR) 50 MG tablet Take 50 mg by mouth 2 (two) times daily. 08/23/16  Yes [provider]  mupirocin ointment (BACTROBAN) 2 % Apply to boils and ulcers QD PRN flares. 12/21/20  Yes Ralene Bathe, MD  omeprazole (PRILOSEC) 20 MG capsule Take 20 mg by mouth daily.   Yes [provider]  pioglitazone (ACTOS) 30 MG tablet Take 30 mg by mouth daily. 07/27/16  Yes [provider]  ramipril (ALTACE) 10 MG capsule Take 10 mg by mouth 2 (two) times daily. 05/18/16  Yes [provider]  spironolactone (ALDACTONE) 50 MG tablet Take 50 mg by mouth daily.   Yes [provider]  terazosin (HYTRIN) 10 MG capsule Take 10 mg by mouth daily.  05/18/16  Yes [provider]  torsemide (DEMADEX) 20 MG tablet Take 20 mg by mouth daily.   Yes [provider]  traZODone (DESYREL) 150 MG tablet Take 150 mg by mouth at bedtime. 09/06/16  Yes [provider]  TRESIBA FLEXTOUCH 200 UNIT/ML FlexTouch Pen Inject 30 Units into the skin in the morning. 12/17/20  Yes [provider]  trolamine salicylate (ASPERCREME) 10 % cream Apply 1 application topically as needed for muscle pain.   Yes [provider]  vitamin B-12 (CYANOCOBALAMIN) 1000 MCG tablet Take 1,000 mcg by mouth daily.   Yes [provider]  acetaminophen (TYLENOL) 650 MG CR tablet Take 650 mg by mouth every 8 (eight) hours as needed for pain. Patient not taking: Reported on 12/29/2020    [provider]    Current Facility-Administered Medications:    acetaminophen (TYLENOL) tablet 650 mg, 650 mg, Oral, Q6H PRN **OR** acetaminophen (TYLENOL) suppository 650 mg, 650 mg, Rectal, Q6H PRN, Para Skeans, MD   diphenhydrAMINE (BENADRYL) injection 25 mg, 25 mg, Intravenous, Q6H PRN, Sreenath, Sudheer B, MD, 25 mg at 12/29/20 1120   insulin aspart (novoLOG) injection 0-15 Units, 0-15 Units, Subcutaneous, TID WC, Patel, Ekta V, MD   metoprolol tartrate (LOPRESSOR) tablet 50 mg, 50 mg, Oral, BID, Sreenath, Sudheer B, MD, 50 mg at 12/29/20 1333  morphine 2 MG/ML injection 2 mg, 2 mg, Intravenous, Q2H PRN, Posey Pronto, Gretta Cool, MD   ondansetron (ZOFRAN) tablet 4 mg, 4 mg, Oral, Q6H PRN **OR** ondansetron (ZOFRAN) injection 4 mg, 4 mg, Intravenous, Q6H PRN, Para Skeans, MD   pantoprazole (PROTONIX) injection 40 mg, 40 mg, Intravenous, Q12H, Sreenath, Sudheer B, MD, 40 mg at 12/29/20 1332   torsemide (DEMADEX) tablet 20 mg, 20 mg, Oral, Daily, Priscella Mann, Sudheer B, MD, 20 mg at 12/29/20 1333  Family History  Problem Relation Age of Onset   Benign prostatic hyperplasia Father    Psoriasis Father    Hypertension Mother    Hyperlipidemia Mother    Diabetes Maternal Grandmother    Diabetes Paternal Grandmother      Social History   Tobacco Use   Smoking status: Never   Smokeless tobacco: Never  Vaping Use   Vaping Use: Never used  Substance Use Topics   Alcohol use: No   Drug use: No    Allergies as of 12/28/2020 - Review Complete 12/28/2020  Allergen Reaction Noted    Ampicillin Diarrhea 10/22/2016   Bactrim [sulfamethoxazole-trimethoprim] Hives 07/12/2020   Cephalexin  05/19/2020   Claritin [loratadine] Other (See Comments) 10/22/2016   Penicillins Rash 07/12/2020    Review of Systems:    All systems reviewed and negative except where noted in HPI.   Physical Exam:  Vital signs in last 24 hours: Temp:  [97.6 F (36.4 C)-98.4 F (36.9 C)] 97.8 F (36.6 C) (12/14 1548) Pulse Rate:  [81-110] 81 (12/14 1548) Resp:  [12-20] 18 (12/14 1548) BP: (95-143)/(30-108) 120/46 (12/14 1548) SpO2:  [96 %-100 %] 100 % (12/14 1548) Weight:  [158.3 kg] 158.3 kg (12/13 1826) Last BM Date: 12/29/20 General:   Pleasant, cooperative in NAD Head:  Normocephalic and atraumatic. Eyes:   No icterus.   Conjunctiva pink. PERRLA. Ears:  Normal auditory acuity. Neck:  Supple; no masses or thyroidomegaly Lungs: Respirations even and unlabored. Lungs clear to auscultation bilaterally.   No wheezes, crackles, or rhonchi.  Heart:  Regular rate and rhythm;  Without murmur, clicks, rubs or gallops Abdomen:  Soft, nondistended, nontender. Normal bowel sounds. No appreciable masses or hepatomegaly.  No rebound or guarding.  Rectal:  Not performed. Msk:  Symmetrical without gross deformities.  Strength generalized weakness Extremities:  Without edema, cyanosis or clubbing. Neurologic:  Alert and oriented x3;  grossly normal neurologically. Skin:  Intact without significant lesions or rashes. Cervical Nodes:  No significant cervical adenopathy. Psych:  Alert and cooperative. Normal affect.  LAB RESULTS: CBC Latest Ref Rng & Units 12/29/2020 12/28/2020 12/28/2020  WBC 4.0 - 10.5 K/uL 1.5(L) - 2.1(L)  Hemoglobin 13.0 - 17.0 g/dL 5.5(L) 5.6(L) 5.8(L)  Hematocrit 39.0 - 52.0 % 16.4(L) 16.8(L) 17.0(L)  Platelets 150 - 400 K/uL 250 - 255    BMET BMP Latest Ref Rng & Units 12/29/2020 12/28/2020 12/23/2020  Glucose 70 - 99 mg/dL 110(H) 132(H) 191(H)  BUN 6 - 20 mg/dL 83(H) 92(H)  35(H)  Creatinine 0.61 - 1.24 mg/dL 1.55(H) 2.06(H) 1.08  Sodium 135 - 145 mmol/L 134(L) 132(L) 135  Potassium 3.5 - 5.1 mmol/L 4.1 4.5 4.1  Chloride 98 - 111 mmol/L 105 103 105  CO2 22 - 32 mmol/L 22 22 23   Calcium 8.9 - 10.3 mg/dL 8.7(L) 8.8(L) 8.6(L)    LFT Hepatic Function Latest Ref Rng & Units 12/29/2020 12/28/2020 10/22/2016  Total Protein 6.5 - 8.1 g/dL 6.4(L) 6.8 6.8  Albumin 3.5 - 5.0 g/dL 3.4(L) 3.5  3.4(L)  AST 15 - 41 U/L 21 20 33  ALT 0 - 44 U/L 17 17 28   Alk Phosphatase 38 - 126 U/L 33(L) 35(L) 38  Total Bilirubin 0.3 - 1.2 mg/dL 1.2 1.2 0.7     STUDIES: US Venous Img Upper Uni Right(DVT)  Result Date: 12/28/2020 CLINICAL DATA:  Right arm swelling EXAM: RIGHT UPPER EXTREMITY VENOUS DOPPLER ULTRASOUND TECHNIQUE: Gray-scale sonography with graded compression, as well as color Doppler and duplex ultrasound were performed to evaluate the upper extremity deep venous system from the level of the subclavian vein and including the jugular, axillary, basilic, radial, ulnar and upper cephalic vein. Spectral Doppler was utilized to evaluate flow at rest and with distal augmentation maneuvers. COMPARISON:  None. FINDINGS: Contralateral Subclavian Vein: Respiratory phasicity is normal and symmetric with the symptomatic side. No evidence of thrombus. Normal compressibility. Internal Jugular Vein: No evidence of thrombus. Normal compressibility, respiratory phasicity and response to augmentation. Subclavian Vein: No evidence of thrombus. Normal compressibility, respiratory phasicity and response to augmentation. Axillary Vein: No evidence of thrombus. Normal compressibility, respiratory phasicity and response to augmentation. Cephalic Vein: Occlusive thrombus seen in the left cephalic vein from the shoulder to the antecubital region. Basilic Vein: No evidence of thrombus. Normal compressibility, respiratory phasicity and response to augmentation. Brachial Veins: No evidence of thrombus. Normal  compressibility, respiratory phasicity and response to augmentation. Radial Veins: No evidence of thrombus. Normal compressibility, respiratory phasicity and response to augmentation. Ulnar Veins: No evidence of thrombus. Normal compressibility, respiratory phasicity and response to augmentation. Venous Reflux:  None visualized. Other Findings:  None visualized. IMPRESSION: Occlusive thrombus in the right upper arm cephalic vein from the shoulder to the elbow. Electronically Signed   By: Rolm Baptise M.D.   On: 12/28/2020 22:44      Impression / Plan:   Patrick Duncan is a 55 y.o. male with history of metabolic syndrome, recent admission for anemia, rectal bleeding, NSAID use, upper endoscopy and colonoscopy were unremarkable, readmitted with worsening anemia and renal function with no evidence of active GI bleed  Worsening anemia: No evidence of active GI bleed Ferritin normal, TIBC mildly elevated Bidirectional endoscopy negative, therefore, recommend video capsule endoscopy to evaluate for any small bowel pathology Check reticulocyte count Agree with hematology consultation With worsening renal function, consider nephrology consultation  Thank you for involving me in the care of this patient.    LOS: 1 day   Sherri Sear, MD  12/29/2020, 4:17 PM    Note: This dictation was prepared with Dragon dictation along with smaller phrase technology. Any transcriptional errors that result from this process are unintentional.

## 2020-12-29 NOTE — Consult Note (Signed)
[place]  Telephone:([phone]) [phone] Fax:(336) [phone]  ID: Patrick Duncan OB: January 09, 1966  MR#: [id number]  ZJI#:[id number]  Patient Care Team: Sofie Hartigan, MD as PCP - General (Family Medicine)  CHIEF COMPLAINT: Anemia  INTERVAL HISTORY: Patrick Duncan is a 55 year old male with past medical history of arthritis, cellulitis, depression, diabetes type 2, hypertension, IgA deficiency, morbid obesity, sleep apnea and hyperlipidemia who was recently admitted to [facility] for anemia and hemoglobin of 6.2.  He was sent from his PCPs office to the emergency room.  He was recently admitted to the hospital on 12/21/2020 for bright red blood per rectum.  Admitted to use of Aleve and other NSAIDs.  Has required irradiated PRBC due to his IgA deficiency and developing blood transfusion reactions during his previous admission.  He received 1 unit PRBC with a drop in his hemoglobin to 5.5 as of this morning.  Reticulocyte panel showed absolute retic count of 45.9.  2 additional units of blood were ordered.  GI was consulted.  He recently had a colonoscopy/EGD which showed nonbleeding internal hemorrhoids, normal esophagus, normal stomach and no specimens were collected.  Plan is to have a video capsule endoscopy to evaluate for any small bowel pathology.  Hematology was consulted given history of IGA deficiency.  Reports as a child having extensive work-up for frequent sinus infections.  His pediatrician sent him to [facility] and he was diagnosed with IgA deficiency.  He followed for several years but eventually stopped.  States now he follows with his PCP.  Reports frequent sinus infections but overall is feels healthy.  States he feels improved since his last blood transfusion.  Denies any chest pain, shortness of breath.  Has some stable fatigue.  REVIEW OF SYSTEMS:   Review of Systems  Constitutional:  Positive for malaise/fatigue. Negative for chills, fever and weight loss.  HENT:   Negative for congestion, ear pain and tinnitus.        Frequent sinus infections  Eyes: Negative.  Negative for blurred vision and double vision.  Respiratory: Negative.  Negative for cough, sputum production and shortness of breath.   Cardiovascular: Negative.  Negative for chest pain, palpitations and leg swelling.  Gastrointestinal: Negative.  Negative for abdominal pain, constipation, diarrhea, nausea and vomiting.  Genitourinary:  Negative for dysuria, frequency and urgency.  Musculoskeletal:  Negative for back pain and falls.  Skin: Negative.  Negative for rash.  Neurological:  Positive for weakness. Negative for headaches.  Endo/Heme/Allergies: Negative.  Does not bruise/bleed easily.  Psychiatric/Behavioral:  Negative for depression. The patient is nervous/anxious. The patient does not have insomnia.    As per HPI. Otherwise, a complete review of systems is negative.  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[place])    Morbid obesity ([place])    Sleep apnea     PAST SURGICAL HISTORY: Past Surgical History:  Procedure Laterality Date   COLONOSCOPY WITH PROPOFOL N/A 07/13/2020   Procedure: COLONOSCOPY WITH PROPOFOL;  Surgeon: Lesly Rubenstein, MD;  Location: [facility] ENDOSCOPY;  Service: Endoscopy;  Laterality: N/A;   COLONOSCOPY WITH PROPOFOL N/A 12/23/2020   Procedure: COLONOSCOPY WITH PROPOFOL;  Surgeon: Lucilla Lame, MD;  Location: [facility] ENDOSCOPY;  Service: Endoscopy;  Laterality: N/A;   ESOPHAGOGASTRODUODENOSCOPY (EGD) WITH PROPOFOL N/A 12/23/2020   Procedure: ESOPHAGOGASTRODUODENOSCOPY (EGD) WITH PROPOFOL;  Surgeon: Lucilla Lame, MD;  Location: [facility] ENDOSCOPY;  Service: Endoscopy;  Laterality: N/A;   TOOTH EXTRACTION      FAMILY HISTORY: Family  History  Problem Relation Age of Onset   Benign prostatic hyperplasia Father    Psoriasis Father    Hypertension Mother    Hyperlipidemia Mother    Diabetes Maternal Grandmother    Diabetes Paternal Grandmother     ADVANCED DIRECTIVES (Y/N):  @ADVDIR @  HEALTH MAINTENANCE: Social History   Tobacco Use   Smoking status: Never   Smokeless tobacco: Never  Vaping Use   Vaping Use: Never used  Substance Use Topics   Alcohol use: No   Drug use: No     Colonoscopy:  PAP:  Bone density:  Lipid panel:  Allergies  Allergen Reactions   Ampicillin Diarrhea   Bactrim [Sulfamethoxazole-Trimethoprim] Hives   Cephalexin    Claritin [Loratadine] Other (See Comments)    Irritates throat   Penicillins Rash    Current Facility-Administered Medications  Medication Dose Route Frequency Provider Last Rate Last Admin   acetaminophen (TYLENOL) tablet 650 mg  650 mg Oral Q6H PRN Para Skeans, MD       Or   acetaminophen (TYLENOL) suppository 650 mg  650 mg Rectal Q6H PRN Para Skeans, MD       diphenhydrAMINE (BENADRYL) injection 25 mg  25 mg Intravenous Q6H PRN Priscella Mann, Sudheer B, MD   25 mg at 12/29/20 1120   insulin aspart (novoLOG) injection 0-15 Units  0-15 Units Subcutaneous TID WC Para Skeans, MD       metoprolol tartrate (LOPRESSOR) tablet 50 mg  50 mg Oral BID Ralene Muskrat B, MD   50 mg at 12/29/20 1333   morphine 2 MG/ML injection 2 mg  2 mg Intravenous Q2H PRN Para Skeans, MD       ondansetron (ZOFRAN) tablet 4 mg  4 mg Oral Q6H PRN Para Skeans, MD       Or   ondansetron (ZOFRAN) injection 4 mg  4 mg Intravenous Q6H PRN Para Skeans, MD       pantoprazole (PROTONIX) injection 40 mg  40 mg Intravenous Q12H Ralene Muskrat B, MD   40 mg at 12/29/20 1332   torsemide (DEMADEX) tablet 20 mg  20 mg Oral Daily Ralene Muskrat B, MD   20 mg at 12/29/20 1333    OBJECTIVE: Vitals:   12/29/20 1448 12/29/20 1548  BP: (!) 143/48 (!) 120/46  Pulse: 85 81  Resp: 18 18   Temp: 98 F (36.7 C) 97.8 F (36.6 C)  SpO2: 97% 100%     Body mass index is 54.66 kg/m.    ECOG FS:1 - Symptomatic but completely ambulatory  Physical Exam Constitutional:      Appearance: Normal appearance. He is morbidly obese.  HENT:     Head: Normocephalic and atraumatic.  Eyes:     Pupils: Pupils are equal, round, and reactive to light.  Cardiovascular:     Rate and Rhythm: Normal rate and regular rhythm.     Heart sounds: Normal heart sounds. No murmur heard. Pulmonary:     Effort: Pulmonary effort is normal.     Breath sounds: Normal breath sounds. No wheezing.  Abdominal:     General: Bowel sounds are normal. There is no distension.     Palpations: Abdomen is soft.  Tenderness: There is no abdominal tenderness.  Musculoskeletal:        General: Normal range of motion.     Cervical back: Normal range of motion.  Skin:    General: Skin is warm and dry.     Findings: No rash.  Neurological:     Mental Status: He is alert and oriented to person, place, and time.  Psychiatric:        Judgment: Judgment normal.     LAB RESULTS:  Lab Results  Component Value Date   NA 134 (L) 12/29/2020   K 4.1 12/29/2020   CL 105 12/29/2020   CO2 22 12/29/2020   GLUCOSE 110 (H) 12/29/2020   BUN 83 (H) 12/29/2020   CREATININE 1.55 (H) 12/29/2020   CALCIUM 8.7 (L) 12/29/2020   PROT 6.4 (L) 12/29/2020   ALBUMIN 3.4 (L) 12/29/2020   AST 21 12/29/2020   ALT 17 12/29/2020   ALKPHOS 33 (L) 12/29/2020   BILITOT 1.2 12/29/2020   GFRNONAA 53 (L) 12/29/2020   GFRAA >60 10/25/2016    Lab Results  Component Value Date   WBC 1.5 (L) 12/29/2020   NEUTROABS 1.7 12/28/2020   HGB 5.5 (L) 12/29/2020   HCT 16.4 (L) 12/29/2020   MCV 97.6 12/29/2020   PLT 250 12/29/2020     STUDIES: DG Chest 2 View  Result Date: 12/14/2020 CLINICAL DATA:  Short of breath and hyperglycemia EXAM: CHEST - 2 VIEW COMPARISON:  None. FINDINGS: Normal mediastinum and cardiac silhouette. Normal  pulmonary vasculature. No evidence of effusion, infiltrate, or pneumothorax. No acute bony abnormality. IMPRESSION: No acute cardiopulmonary process. Electronically Signed   By: Suzy Bouchard M.D.   On: 12/14/2020 12:20   US Venous Img Upper Uni Right(DVT)  Result Date: 12/28/2020 CLINICAL DATA:  Right arm swelling EXAM: RIGHT UPPER EXTREMITY VENOUS DOPPLER ULTRASOUND TECHNIQUE: Gray-scale sonography with graded compression, as well as color Doppler and duplex ultrasound were performed to evaluate the upper extremity deep venous system from the level of the subclavian vein and including the jugular, axillary, basilic, radial, ulnar and upper cephalic vein. Spectral Doppler was utilized to evaluate flow at rest and with distal augmentation maneuvers. COMPARISON:  None. FINDINGS: Contralateral Subclavian Vein: Respiratory phasicity is normal and symmetric with the symptomatic side. No evidence of thrombus. Normal compressibility. Internal Jugular Vein: No evidence of thrombus. Normal compressibility, respiratory phasicity and response to augmentation. Subclavian Vein: No evidence of thrombus. Normal compressibility, respiratory phasicity and response to augmentation. Axillary Vein: No evidence of thrombus. Normal compressibility, respiratory phasicity and response to augmentation. Cephalic Vein: Occlusive thrombus seen in the left cephalic vein from the shoulder to the antecubital region. Basilic Vein: No evidence of thrombus. Normal compressibility, respiratory phasicity and response to augmentation. Brachial Veins: No evidence of thrombus. Normal compressibility, respiratory phasicity and response to augmentation. Radial Veins: No evidence of thrombus. Normal compressibility, respiratory phasicity and response to augmentation. Ulnar Veins: No evidence of thrombus. Normal compressibility, respiratory phasicity and response to augmentation. Venous Reflux:  None visualized. Other Findings:  None visualized.  IMPRESSION: Occlusive thrombus in the right upper arm cephalic vein from the shoulder to the elbow. Electronically Signed   By: Rolm Baptise M.D.   On: 12/28/2020 22:44   ECHOCARDIOGRAM COMPLETE  Result Date: 12/23/2020    ECHOCARDIOGRAM REPORT   Patient Name:   NOX TALENT Date of Exam: 12/23/2020 Medical Rec #:  [id number]         Height:       67.0  in Accession #:    [id number]        Weight:       349.0 lb Date of Birth:  1965/05/13        BSA:          2.562 m Patient Age:    68 years          BP:           129/44 mmHg Patient Gender: M                 HR:           82 bpm. Exam Location:  [facility] Procedure: 2D Echo, Cardiac Doppler and Color Doppler Indications:     Pericardial Effusion I31.3  History:         Patient has no prior history of Echocardiogram examinations.                  Risk Factors:Diabetes, Hypertension, Dyslipidemia and Sleep                  Apnea.  Sonographer:     Sherrie Sport Referring Phys:  [id number] [facility] AMIN Diagnosing Phys: Ida Rogue MD  Sonographer Comments: Suboptimal apical window and no subcostal window. IMPRESSIONS  1. Left ventricular ejection fraction, by estimation, is 60 to 65%. The left ventricle has normal function. The left ventricle has no regional wall motion abnormalities. There is moderate left ventricular hypertrophy. Left ventricular diastolic parameters are consistent with Grade II diastolic dysfunction (pseudonormalization).  2. Right ventricular systolic function is normal. The right ventricular size is normal. There is normal pulmonary artery systolic pressure. The estimated right ventricular systolic pressure is 22.0 mmHg.  3. Left atrial size was mildly dilated.  4. A small to moderate noncircumferential pericardial effusion is present.No tamponade.  5. The mitral valve is normal in structure. No evidence of mitral valve regurgitation. No evidence of mitral stenosis.  6. The aortic valve was not well visualized. Aortic valve regurgitation is not  visualized. No aortic stenosis is present.  7. The inferior vena cava is normal in size with greater than 50% respiratory variability, suggesting right atrial pressure of 3 mmHg. FINDINGS  Left Ventricle: Left ventricular ejection fraction, by estimation, is 60 to 65%. The left ventricle has normal function. The left ventricle has no regional wall motion abnormalities. The left ventricular internal cavity size was normal in size. There is  moderate left ventricular hypertrophy. Left ventricular diastolic parameters are consistent with Grade II diastolic dysfunction (pseudonormalization). Right Ventricle: The right ventricular size is normal. No increase in right ventricular wall thickness. Right ventricular systolic function is normal. There is normal pulmonary artery systolic pressure. The tricuspid regurgitant velocity is 1.59 m/s, and  with an assumed right atrial pressure of 5 mmHg, the estimated right ventricular systolic pressure is 25.4 mmHg. Left Atrium: Left atrial size was mildly dilated. Right Atrium: Right atrial size was normal in size. Pericardium: A small pericardial effusion is present. There is no evidence of cardiac tamponade. Mitral Valve: The mitral valve is normal in structure. No evidence of mitral valve regurgitation. No evidence of mitral valve stenosis. MV peak gradient, 5.7 mmHg. The mean mitral valve gradient is 3.0 mmHg. Tricuspid Valve: The tricuspid valve is normal in structure. Tricuspid valve regurgitation is mild . No evidence of tricuspid stenosis. Aortic Valve: The aortic valve was not well visualized. Aortic valve regurgitation is not visualized. No aortic stenosis is present. Aortic valve mean gradient measures 4.0  mmHg. Aortic valve peak gradient measures 7.4 mmHg. Aortic valve area, by VTI measures 4.99 cm. Pulmonic Valve: The pulmonic valve was normal in structure. Pulmonic valve regurgitation is not visualized. No evidence of pulmonic stenosis. Aorta: The aortic root is  normal in size and structure. Venous: The inferior vena cava is normal in size with greater than 50% respiratory variability, suggesting right atrial pressure of 3 mmHg. IAS/Shunts: No atrial level shunt detected by color flow Doppler.  LEFT VENTRICLE PLAX 2D LVIDd:         5.70 cm   Diastology LVIDs:         3.50 cm   LV e' medial:    6.64 cm/s LV PW:         1.40 cm   LV E/e' medial:  18.4 LV IVS:        1.20 cm   LV e' lateral:   10.30 cm/s LVOT diam:     2.20 cm   LV E/e' lateral: 11.8 LV SV:         111 LV SV Index:   43 LVOT Area:     3.80 cm  RIGHT VENTRICLE RV Basal diam:  5.00 cm RV S prime:     14.90 cm/s TAPSE (M-mode): 5.2 cm LEFT ATRIUM              Index        RIGHT ATRIUM           Index LA diam:        4.70 cm  1.83 cm/m   RA Area:     46.30 cm LA Vol (A2C):   126.0 ml 49.18 ml/m  RA Volume:   221.00 ml 86.26 ml/m LA Vol (A4C):   105.0 ml 40.98 ml/m LA Biplane Vol: 118.0 ml 46.06 ml/m  AORTIC VALVE                    PULMONIC VALVE AV Area (Vmax):    4.64 cm     PV Vmax:        1.06 m/s AV Area (Vmean):   3.99 cm     PV Vmean:       82.200 cm/s AV Area (VTI):     4.99 cm     PV VTI:         0.204 m AV Vmax:           136.00 cm/s  PV Peak grad:   4.5 mmHg AV Vmean:          92.900 cm/s  PV Mean grad:   3.0 mmHg AV VTI:            0.222 m      RVOT Peak grad: 7 mmHg AV Peak Grad:      7.4 mmHg AV Mean Grad:      4.0 mmHg LVOT Vmax:         166.00 cm/s LVOT Vmean:        97.500 cm/s LVOT VTI:          0.291 m LVOT/AV VTI ratio: 1.31  AORTA Ao Root diam: 3.33 cm MITRAL VALVE                TRICUSPID VALVE MV Area (PHT): 3.33 cm     TR Peak grad:   10.1 mmHg MV Area VTI:   3.76 cm     TR Vmax:        159.00 cm/s MV Peak  grad:  5.7 mmHg MV Mean grad:  3.0 mmHg     SHUNTS MV Vmax:       1.19 m/s     Systemic VTI:  0.29 m MV Vmean:      82.0 cm/s    Systemic Diam: 2.20 cm MV Decel Time: 228 msec     Pulmonic VTI:  0.260 m MV E velocity: 122.00 cm/s MV A velocity: 121.00 cm/s MV E/A ratio:  1.01  Ida Rogue MD Electronically signed by Ida Rogue MD Signature Date/Time: 12/23/2020/10:15:34 AM    Final     ASSESSMENT: Mr. Guo is a 55 year old male who is here for rectal bleeding and symptomatic anemia.  Has history of IgA deficiency as a child.  Discussed case with Dr. Grayland Ormond who recommends repeat immunoglobulins given IgA deficiency is rare.  Typically patient is managed by a specialist.   PLAN:   Anemia likely secondary to GI bleed.  Plan is for video capsule study per GI. Repeat immunoglobulins.  If true IgA deficiency will refer to [facility] or [facility] for management of his care. Previously had reaction during blood transfusion recommend irradiated blood moving forward.  Blood counts appear to be improving post additional 2 units of blood.  IgA still pending.  I spent 45 minutes dedicated to the care of this patient (face-to-face and non-face-to-face) on the date of the encounter to include what is described in the assessment and plan.   Patient expressed understanding and was in agreement with this plan. He also understands that He can call clinic at any time with any questions, concerns, or complaints.    Cancer Staging  No matching staging information was found for the patient.  Jacquelin Hawking, NP   12/29/2020 5:18 PM

## 2020-12-29 NOTE — Progress Notes (Signed)
PROGRESS NOTE    Patrick Duncan  ERD:[id number] DOB: 20-Feb-1965 DOA: 12/28/2020 PCP: Sofie Hartigan, MD    Brief Narrative:   55 y.o. male seen in ed with complaints of hemoglobin of 6.2.  Patient coming to [place] from home/ pcp office.  Patient was recently admitted on 12/21/2020 with bright red blood per rectum patient does use Aleve which I have asked him to refrain from using Aleve and any NSAIDs. Patient states that other than weakness he was actually doing well and was not feeling bad in any way.  Patient denies any headaches blurred vision chest pain shortness of breath nausea vomiting.  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   Pt has past medical history of arthritis, cellulitis, depression, diabetes mellitus type 2, hypertension, IgA deficiency, morbid obesity, sleep apnea, hyperlipidemia.  Patient was admitted on 12 -6-22 fro anemia and had transfusion and egd and colonoscopy.   12/14: Hemoglobin dropped to 5.5.  Ordered 2 units transfusion however pending arrival of washed PRBCs from outside facility.  Patient hemodynamically stable.  Discussed with GI.  Recent colonoscopy and EGD.  So we will proceed with video capsule endoscopy today   Assessment & Plan:   Principal Problem:   Acute blood loss anemia Active Problems:   Diabetes (HCC)   HTN (hypertension)   OSA (obstructive sleep apnea)   AKI (acute kidney injury) ([place])   GI bleed   Acute on chronic blood loss anemia  Acute on chronic anemia Suspected GI bleed Likely GI source however recent colonoscopy and EGD negative for acute bleed Discussed with GI, appreciate consultation Patient needs washed PRBC Plan: Transfused 2 units PRBC washed Video capsule endoscopy today Repeat CBC posttransfusion Further recommendations after video endoscopy  History of IgA deficiency Per patient this was diagnosed after recurrent  illnesses as a child Does not see immunology or hematology as outpatient No specific immunodeficiency medications Plan: Will request hematology evaluation in the setting of severe anemia  Essential hypertension Blood pressure reasonably well controlled over interval Likely in the setting of acute blood loss anemia At home patient takes amlodipine, ramipril, Aldactone, torsemide, metoprolol titrate Plan: Restart metoprolol titrate at home dose of 50 mg twice daily Restart Demadex 20 mg daily Hold amlodipine, ramipril for now  Type 2 diabetes mellitus Oral agents on hold Moderate sliding scale while n.p.o. Hypoglycemia protocol Add Lantus once diet advanced  Acute kidney injury Avoid nonessential nephrotoxins Was on IV fluids since admission, will DC today  Morbid obesity BMI 54 This complicates overall care and prognosis Request RD consultation    DVT prophylaxis: SCD Code Status: Full Family Communication: Spouse at bedside 12/14 Disposition Plan: Status is: Inpatient  Remains inpatient appropriate because: Acute on chronic blood loss anemia       Level of care: Telemetry Medical  Consultants:  GI Hematology  Procedures:  Video capsule endoscopy 12/14  Antimicrobials: None   Subjective: Seen and examined.  Resting in bed comfortably.  No pain complaints.  Objective: Vitals:   12/29/20 0630 12/29/20 0700 12/29/20 0800 12/29/20 0859  BP: (!) 124/58 134/60 (!) 143/55 (!) 130/52  Pulse:  90 90 (!) 106  Resp: 18 14 14 20   Temp: 98.1 F (36.7 C)  98 F (36.7 C) 97.7 F (36.5 C)  TempSrc: Oral   Oral  SpO2:  100% 100% 100%  Weight:      Height:  Intake/Output Summary (Last 24 hours) at 12/29/2020 1102 Last data filed at 12/29/2020 0301 Gross per 24 hour  Intake 1000 ml  Output --  Net 1000 ml   Filed Weights   12/28/20 1826  Weight: (!) 158.3 kg    Examination:  General exam: No acute distress Respiratory system: Bibasilar  crackles.  Normal work of breathing.  Room air Cardiovascular system: S1-S2, RRR, no murmurs Gastrointestinal system: Obese, NT/ND, normal bowel sounds Central nervous system: Alert and oriented. No focal neurological deficits. Extremities: Symmetric 5 x 5 power. Skin: No rashes, lesions or ulcers Psychiatry: Judgement and insight appear normal. Mood & affect appropriate.     Data Reviewed: I have personally reviewed following labs and imaging studies  CBC: Recent Labs  Lab 12/22/20 1650 12/23/20 0631 12/23/20 1656 12/28/20 2116 12/28/20 2340 12/29/20 0442  WBC 3.8* 1.6* 3.3* 2.1*  --  1.5*  NEUTROABS  --   --   --  1.7  --   --   HGB 8.3* 7.5* 8.4* 5.8* 5.6* 5.5*  HCT 24.0* 21.9* 25.0* 17.0* 16.8* 16.4*  MCV 96.4 96.9 95.8 97.1  --  97.6  PLT 341 303 335 255  --  675   Basic Metabolic Panel: Recent Labs  Lab 12/23/20 0631 12/28/20 2116 12/29/20 0442  NA 135 132* 134*  K 4.1 4.5 4.1  CL 105 103 105  CO2 23 22 22   GLUCOSE 191* 132* 110*  BUN 35* 92* 83*  CREATININE 1.08 2.06* 1.55*  CALCIUM 8.6* 8.8* 8.7*   GFR: Estimated Creatinine Clearance: 80.3 mL/min (A) (by C-G formula based on SCr of 1.55 mg/dL (H)). Liver Function Tests: Recent Labs  Lab 12/28/20 2116 12/29/20 0442  AST 20 21  ALT 17 17  ALKPHOS 35* 33*  BILITOT 1.2 1.2  PROT 6.8 6.4*  ALBUMIN 3.5 3.4*   No results for input(s): LIPASE, AMYLASE in the last 168 hours. No results for input(s): AMMONIA in the last 168 hours. Coagulation Profile: No results for input(s): INR, PROTIME in the last 168 hours. Cardiac Enzymes: No results for input(s): CKTOTAL, CKMB, CKMBINDEX, TROPONINI in the last 168 hours. BNP (last 3 results) No results for input(s): PROBNP in the last 8760 hours. HbA1C: No results for input(s): HGBA1C in the last 72 hours. CBG: Recent Labs  Lab 12/23/20 0759 12/23/20 1127 12/23/20 1552 12/29/20 0111 12/29/20 0745  GLUCAP 169* 160* 191* 121* 109*   Lipid Profile: No  results for input(s): CHOL, HDL, LDLCALC, TRIG, CHOLHDL, LDLDIRECT in the last 72 hours. Thyroid Function Tests: No results for input(s): TSH, T4TOTAL, FREET4, T3FREE, THYROIDAB in the last 72 hours. Anemia Panel: No results for input(s): VITAMINB12, FOLATE, FERRITIN, TIBC, IRON, RETICCTPCT in the last 72 hours. Sepsis Labs: No results for input(s): PROCALCITON, LATICACIDVEN in the last 168 hours.  Recent Results (from the past 240 hour(s))  Resp Panel by RT-PCR (Flu A&B, Covid) Nasopharyngeal Swab     Status: None   Collection Time: 12/21/20  5:11 PM   Specimen: Nasopharyngeal Swab; Nasopharyngeal(NP) swabs in vial transport medium  Result Value Ref Range Status   SARS Coronavirus 2 by RT PCR NEGATIVE NEGATIVE Final    Comment: (NOTE) SARS-CoV-2 target nucleic acids are NOT DETECTED.  The SARS-CoV-2 RNA is generally detectable in upper respiratory specimens during the acute phase of infection. The lowest concentration of SARS-CoV-2 viral copies this assay can detect is 138 copies/mL. A negative result does not preclude SARS-Cov-2 infection and should not be used as the sole  basis for treatment or other patient management decisions. A negative result may occur with  improper specimen collection/handling, submission of specimen other than nasopharyngeal swab, presence of viral mutation(s) within the areas targeted by this assay, and inadequate number of viral copies(<138 copies/mL). A negative result must be combined with clinical observations, patient history, and epidemiological information. The expected result is Negative.  Fact Sheet for Patients:  EntrepreneurPulse.com.au  Fact Sheet for Healthcare Providers:  IncredibleEmployment.be  This test is no t yet approved or cleared by the [place] FDA and  has been authorized for detection and/or diagnosis of SARS-CoV-2 by FDA under an Emergency Use Authorization (EUA). This EUA will remain   in effect (meaning this test can be used) for the duration of the COVID-19 declaration under Section 564(b)(1) of the Act, 21 U.S.C.section 360bbb-3(b)(1), unless the authorization is terminated  or revoked sooner.       Influenza A by PCR NEGATIVE NEGATIVE Final   Influenza B by PCR NEGATIVE NEGATIVE Final    Comment: (NOTE) The Xpert Xpress SARS-CoV-2/FLU/RSV plus assay is intended as an aid in the diagnosis of influenza from Nasopharyngeal swab specimens and should not be used as a sole basis for treatment. Nasal washings and aspirates are unacceptable for Xpert Xpress SARS-CoV-2/FLU/RSV testing.  Fact Sheet for Patients: EntrepreneurPulse.com.au  Fact Sheet for Healthcare Providers: IncredibleEmployment.be  This test is not yet approved or cleared by the [place] FDA and has been authorized for detection and/or diagnosis of SARS-CoV-2 by FDA under an Emergency Use Authorization (EUA). This EUA will remain in effect (meaning this test can be used) for the duration of the COVID-19 declaration under Section 564(b)(1) of the Act, 21 U.S.C. section 360bbb-3(b)(1), unless the authorization is terminated or revoked.  Performed at [facility], [address] [place], [place] [address]   Resp Panel by RT-PCR (Flu A&B, Covid) Nasopharyngeal Swab     Status: None   Collection Time: 12/28/20  9:16 PM   Specimen: Nasopharyngeal Swab; Nasopharyngeal(NP) swabs in vial transport medium  Result Value Ref Range Status   SARS Coronavirus 2 by RT PCR NEGATIVE NEGATIVE Final    Comment: (NOTE) SARS-CoV-2 target nucleic acids are NOT DETECTED.  The SARS-CoV-2 RNA is generally detectable in upper respiratory specimens during the acute phase of infection. The lowest concentration of SARS-CoV-2 viral copies this assay can detect is 138 copies/mL. A negative result does not preclude SARS-Cov-2 infection and should not be used as the sole  basis for treatment or other patient management decisions. A negative result may occur with  improper specimen collection/handling, submission of specimen other than nasopharyngeal swab, presence of viral mutation(s) within the areas targeted by this assay, and inadequate number of viral copies(<138 copies/mL). A negative result must be combined with clinical observations, patient history, and epidemiological information. The expected result is Negative.  Fact Sheet for Patients:  EntrepreneurPulse.com.au  Fact Sheet for Healthcare Providers:  IncredibleEmployment.be  This test is no t yet approved or cleared by the [place] FDA and  has been authorized for detection and/or diagnosis of SARS-CoV-2 by FDA under an Emergency Use Authorization (EUA). This EUA will remain  in effect (meaning this test can be used) for the duration of the COVID-19 declaration under Section 564(b)(1) of the Act, 21 U.S.C.section 360bbb-3(b)(1), unless the authorization is terminated  or revoked sooner.       Influenza A by PCR NEGATIVE NEGATIVE Final   Influenza B by PCR NEGATIVE NEGATIVE Final  Comment: (NOTE) The Xpert Xpress SARS-CoV-2/FLU/RSV plus assay is intended as an aid in the diagnosis of influenza from Nasopharyngeal swab specimens and should not be used as a sole basis for treatment. Nasal washings and aspirates are unacceptable for Xpert Xpress SARS-CoV-2/FLU/RSV testing.  Fact Sheet for Patients: EntrepreneurPulse.com.au  Fact Sheet for Healthcare Providers: IncredibleEmployment.be  This test is not yet approved or cleared by the [place] FDA and has been authorized for detection and/or diagnosis of SARS-CoV-2 by FDA under an Emergency Use Authorization (EUA). This EUA will remain in effect (meaning this test can be used) for the duration of the COVID-19 declaration under Section 564(b)(1) of the Act,  21 U.S.C. section 360bbb-3(b)(1), unless the authorization is terminated or revoked.  Performed at [facility], [address], [place], [place] [address]          Radiology Studies: US Venous Img Upper Uni Right(DVT)  Result Date: 12/28/2020 CLINICAL DATA:  Right arm swelling EXAM: RIGHT UPPER EXTREMITY VENOUS DOPPLER ULTRASOUND TECHNIQUE: Gray-scale sonography with graded compression, as well as color Doppler and duplex ultrasound were performed to evaluate the upper extremity deep venous system from the level of the subclavian vein and including the jugular, axillary, basilic, radial, ulnar and upper cephalic vein. Spectral Doppler was utilized to evaluate flow at rest and with distal augmentation maneuvers. COMPARISON:  None. FINDINGS: Contralateral Subclavian Vein: Respiratory phasicity is normal and symmetric with the symptomatic side. No evidence of thrombus. Normal compressibility. Internal Jugular Vein: No evidence of thrombus. Normal compressibility, respiratory phasicity and response to augmentation. Subclavian Vein: No evidence of thrombus. Normal compressibility, respiratory phasicity and response to augmentation. Axillary Vein: No evidence of thrombus. Normal compressibility, respiratory phasicity and response to augmentation. Cephalic Vein: Occlusive thrombus seen in the left cephalic vein from the shoulder to the antecubital region. Basilic Vein: No evidence of thrombus. Normal compressibility, respiratory phasicity and response to augmentation. Brachial Veins: No evidence of thrombus. Normal compressibility, respiratory phasicity and response to augmentation. Radial Veins: No evidence of thrombus. Normal compressibility, respiratory phasicity and response to augmentation. Ulnar Veins: No evidence of thrombus. Normal compressibility, respiratory phasicity and response to augmentation. Venous Reflux:  None visualized. Other Findings:  None visualized. IMPRESSION: Occlusive  thrombus in the right upper arm cephalic vein from the shoulder to the elbow. Electronically Signed   By: Rolm Baptise M.D.   On: 12/28/2020 22:44        Scheduled Meds:  insulin aspart  0-15 Units Subcutaneous TID WC   Continuous Infusions:  lactated ringers 100 mL/hr at 12/29/20 0600     LOS: 1 day    Time spent: 35 minutes    Sidney Ace, MD [facility]   If 7PM-7AM, please contact night-coverage  12/29/2020, 11:02 AM

## 2020-12-30 ENCOUNTER — Encounter: Payer: Self-pay | Admitting: Gastroenterology

## 2020-12-30 LAB — CBC
HCT: 21.9 % — ABNORMAL LOW (ref 39.0–52.0)
Hemoglobin: 7.4 g/dL — ABNORMAL LOW (ref 13.0–17.0)
MCH: 32.2 pg (ref 26.0–34.0)
MCHC: 33.8 g/dL (ref 30.0–36.0)
MCV: 95.2 fL (ref 80.0–100.0)
Platelets: 294 10*3/uL (ref 150–400)
RBC: 2.3 MIL/uL — ABNORMAL LOW (ref 4.22–5.81)
RDW: 19.3 % — ABNORMAL HIGH (ref 11.5–15.5)
WBC: 1.6 10*3/uL — ABNORMAL LOW (ref 4.0–10.5)
nRBC: 0 % (ref 0.0–0.2)

## 2020-12-30 LAB — BASIC METABOLIC PANEL
Anion gap: 7 (ref 5–15)
BUN: 48 mg/dL — ABNORMAL HIGH (ref 6–20)
CO2: 24 mmol/L (ref 22–32)
Calcium: 9.2 mg/dL (ref 8.9–10.3)
Chloride: 106 mmol/L (ref 98–111)
Creatinine, Ser: 1.19 mg/dL (ref 0.61–1.24)
GFR, Estimated: >60 mL/min (ref >60)
Glucose, Bld: 125 mg/dL — ABNORMAL HIGH (ref 70–99)
Potassium: 4.1 mmol/L (ref 3.5–5.1)
Sodium: 137 mmol/L (ref 135–145)

## 2020-12-30 LAB — TYPE AND SCREEN
ABO/RH(D): O POS
Antibody Screen: POSITIVE
DAT, IgG: POSITIVE
DAT, complement: POSITIVE
Unit division: 0
Unit division: 0

## 2020-12-30 LAB — BPAM RBC
Blood Product Expiration Date: 202212150409
Blood Product Expiration Date: 202212150443
ISSUE DATE / TIME: 202212141131
ISSUE DATE / TIME: 202212141418
Unit Type and Rh: 5100
Unit Type and Rh: 5100

## 2020-12-30 LAB — GLUCOSE, CAPILLARY
Glucose-Capillary: 139 mg/dL — ABNORMAL HIGH (ref 70–99)
Glucose-Capillary: 148 mg/dL — ABNORMAL HIGH (ref 70–99)
Glucose-Capillary: 182 mg/dL — ABNORMAL HIGH (ref 70–99)
Glucose-Capillary: 286 mg/dL — ABNORMAL HIGH (ref 70–99)

## 2020-12-30 NOTE — Progress Notes (Signed)
PROGRESS NOTE    Patrick Duncan  PJK:[id number] DOB: 09-Feb-1965 DOA: 12/28/2020 PCP: Sofie Hartigan, MD    Brief Narrative:   55 y.o. male seen in ed with complaints of hemoglobin of 6.2.  Patient coming to [place] from home/ pcp office.  Patient was recently admitted on 12/21/2020 with bright red blood per rectum patient does use Aleve which I have asked him to refrain from using Aleve and any NSAIDs. Patient states that other than weakness he was actually doing well and was not feeling bad in any way.  Patient denies any headaches blurred vision chest pain shortness of breath nausea vomiting.  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   Pt has past medical history of arthritis, cellulitis, depression, diabetes mellitus type 2, hypertension, IgA deficiency, morbid obesity, sleep apnea, hyperlipidemia.  Patient was admitted on 12 -6-22 fro anemia and had transfusion and egd and colonoscopy.   12/14: Hemoglobin dropped to 5.5.  Ordered 2 units transfusion however pending arrival of washed PRBCs from outside facility.  Patient hemodynamically stable.  Discussed with GI.  Recent colonoscopy and EGD.  So we will proceed with video capsule endoscopy today   Assessment & Plan:   Principal Problem:   Acute blood loss anemia Active Problems:   Diabetes (HCC)   HTN (hypertension)   OSA (obstructive sleep apnea)   AKI (acute kidney injury) ([place])   GI bleed   Acute on chronic blood loss anemia  Acute on chronic anemia Suspected GI bleed Likely GI source however recent colonoscopy and EGD negative for acute bleed Discussed with GI, appreciate consultation Patient needs washed PRBC Transfused 2 units on 12/14 S/p video endoscopy, results pending Plan: Defer diet recs to GI Pending results of capsule study Monitor hb, transfuse prn hb<7  History of IgA deficiency Per patient this was diagnosed  after recurrent illnesses as a child Does not see immunology or hematology as outpatient No specific immunodeficiency medications Plan: Hematology recs appreciated Immunoglobulin panel sent and pending If IgA def confirmed, will need referral to tertiary center  Essential hypertension Blood pressure reasonably well controlled over interval Likely in the setting of acute blood loss anemia At home patient takes amlodipine, ramipril, Aldactone, torsemide, metoprolol titrate Plan: Continue metoprolol titrate at home dose of 50 mg twice daily Continue Demadex 20 mg daily Hold amlodipine, ramipril for now  Type 2 diabetes mellitus Oral agents on hold Moderate sliding scale Hypoglycemia protocol Add Lantus once diet advanced  Acute kidney injury Avoid nonessential nephrotoxins Monitor kidney function  Morbid obesity BMI 54 This complicates overall care and prognosis Request RD consultation    DVT prophylaxis: SCD Code Status: Full Family Communication: Spouse at bedside 12/14, father at bedside 12/15 Disposition Plan: Status is: Inpatient  Remains inpatient appropriate because: Acute on chronic blood loss anemia       Level of care: Telemetry Medical  Consultants:  GI Hematology  Procedures:  Video capsule endoscopy 12/14  Antimicrobials: None   Subjective: Seen and examined.  Resting in bed comfortably.  No pain complaints.  Objective: Vitals:   12/29/20 1954 12/30/20 0016 12/30/20 0436 12/30/20 0749  BP: (!) 116/57 (!) 100/36 (!) 118/56 (!) 133/54  Pulse: 81 71 76 88  Resp: 19 19 20 18   Temp: 98.3 F (36.8 C) 98.3 F (36.8 C) 98 F (36.7 C) 98.2 F (36.8 C)  TempSrc:    Oral  SpO2: 99% 99% 96%  98%  Weight:      Height:        Intake/Output Summary (Last 24 hours) at 12/30/2020 1101 Last data filed at 12/29/2020 1710 Gross per 24 hour  Intake 490 ml  Output --  Net 490 ml   Filed Weights   12/28/20 1826  Weight: (!) 158.3 kg     Examination:  General exam: No apparent distress Respiratory system: Lungs clear, normal WOB, RA Cardiovascular system: S1-S2, RRR, no murmurs Gastrointestinal system: Obese, NT/ND, normal bowel sounds Central nervous system: Alert and oriented. No focal neurological deficits. Extremities: Symmetric 5 x 5 power. Skin: No rashes, lesions or ulcers Psychiatry: Judgement and insight appear normal. Mood & affect appropriate.     Data Reviewed: I have personally reviewed following labs and imaging studies  CBC: Recent Labs  Lab 12/23/20 1656 12/28/20 2116 12/28/20 2340 12/29/20 0442 12/29/20 1940 12/30/20 0501  WBC 3.3* 2.1*  --  1.5* 2.2* 1.6*  NEUTROABS  --  1.7  --   --   --   --   HGB 8.4* 5.8* 5.6* 5.5* 7.3* 7.4*  HCT 25.0* 17.0* 16.8* 16.4* 21.0* 21.9*  MCV 95.8 97.1  --  97.6 92.5 95.2  PLT 335 255  --  250 286 175   Basic Metabolic Panel: Recent Labs  Lab 12/28/20 2116 12/29/20 0442 12/30/20 0501  NA 132* 134* 137  K 4.5 4.1 4.1  CL 103 105 106  CO2 22 22 24   GLUCOSE 132* 110* 125*  BUN 92* 83* 48*  CREATININE 2.06* 1.55* 1.19  CALCIUM 8.8* 8.7* 9.2   GFR: Estimated Creatinine Clearance: 104.6 mL/min (by C-G formula based on SCr of 1.19 mg/dL). Liver Function Tests: Recent Labs  Lab 12/28/20 2116 12/29/20 0442  AST 20 21  ALT 17 17  ALKPHOS 35* 33*  BILITOT 1.2 1.2  PROT 6.8 6.4*  ALBUMIN 3.5 3.4*   No results for input(s): LIPASE, AMYLASE in the last 168 hours. No results for input(s): AMMONIA in the last 168 hours. Coagulation Profile: No results for input(s): INR, PROTIME in the last 168 hours. Cardiac Enzymes: No results for input(s): CKTOTAL, CKMB, CKMBINDEX, TROPONINI in the last 168 hours. BNP (last 3 results) No results for input(s): PROBNP in the last 8760 hours. HbA1C: No results for input(s): HGBA1C in the last 72 hours. CBG: Recent Labs  Lab 12/29/20 0745 12/29/20 1144 12/29/20 1630 12/29/20 2027 12/30/20 0754  GLUCAP  109* 97 180* 125* 139*   Lipid Profile: No results for input(s): CHOL, HDL, LDLCALC, TRIG, CHOLHDL, LDLDIRECT in the last 72 hours. Thyroid Function Tests: No results for input(s): TSH, T4TOTAL, FREET4, T3FREE, THYROIDAB in the last 72 hours. Anemia Panel: Recent Labs    12/29/20 0442  RETICCTPCT 2.8   Sepsis Labs: No results for input(s): PROCALCITON, LATICACIDVEN in the last 168 hours.  Recent Results (from the past 240 hour(s))  Resp Panel by RT-PCR (Flu A&B, Covid) Nasopharyngeal Swab     Status: None   Collection Time: 12/21/20  5:11 PM   Specimen: Nasopharyngeal Swab; Nasopharyngeal(NP) swabs in vial transport medium  Result Value Ref Range Status   SARS Coronavirus 2 by RT PCR NEGATIVE NEGATIVE Final    Comment: (NOTE) SARS-CoV-2 target nucleic acids are NOT DETECTED.  The SARS-CoV-2 RNA is generally detectable in upper respiratory specimens during the acute phase of infection. The lowest concentration of SARS-CoV-2 viral copies this assay can detect is 138 copies/mL. A negative result does not preclude SARS-Cov-2 infection and should  not be used as the sole basis for treatment or other patient management decisions. A negative result may occur with  improper specimen collection/handling, submission of specimen other than nasopharyngeal swab, presence of viral mutation(s) within the areas targeted by this assay, and inadequate number of viral copies(<138 copies/mL). A negative result must be combined with clinical observations, patient history, and epidemiological information. The expected result is Negative.  Fact Sheet for Patients:  EntrepreneurPulse.com.au  Fact Sheet for Healthcare Providers:  IncredibleEmployment.be  This test is no t yet approved or cleared by the [place] FDA and  has been authorized for detection and/or diagnosis of SARS-CoV-2 by FDA under an Emergency Use Authorization (EUA). This EUA will remain   in effect (meaning this test can be used) for the duration of the COVID-19 declaration under Section 564(b)(1) of the Act, 21 U.S.C.section 360bbb-3(b)(1), unless the authorization is terminated  or revoked sooner.       Influenza A by PCR NEGATIVE NEGATIVE Final   Influenza B by PCR NEGATIVE NEGATIVE Final    Comment: (NOTE) The Xpert Xpress SARS-CoV-2/FLU/RSV plus assay is intended as an aid in the diagnosis of influenza from Nasopharyngeal swab specimens and should not be used as a sole basis for treatment. Nasal washings and aspirates are unacceptable for Xpert Xpress SARS-CoV-2/FLU/RSV testing.  Fact Sheet for Patients: EntrepreneurPulse.com.au  Fact Sheet for Healthcare Providers: IncredibleEmployment.be  This test is not yet approved or cleared by the [place] FDA and has been authorized for detection and/or diagnosis of SARS-CoV-2 by FDA under an Emergency Use Authorization (EUA). This EUA will remain in effect (meaning this test can be used) for the duration of the COVID-19 declaration under Section 564(b)(1) of the Act, 21 U.S.C. section 360bbb-3(b)(1), unless the authorization is terminated or revoked.  Performed at [facility], [address] [place], [place] [address]   Resp Panel by RT-PCR (Flu A&B, Covid) Nasopharyngeal Swab     Status: None   Collection Time: 12/28/20  9:16 PM   Specimen: Nasopharyngeal Swab; Nasopharyngeal(NP) swabs in vial transport medium  Result Value Ref Range Status   SARS Coronavirus 2 by RT PCR NEGATIVE NEGATIVE Final    Comment: (NOTE) SARS-CoV-2 target nucleic acids are NOT DETECTED.  The SARS-CoV-2 RNA is generally detectable in upper respiratory specimens during the acute phase of infection. The lowest concentration of SARS-CoV-2 viral copies this assay can detect is 138 copies/mL. A negative result does not preclude SARS-Cov-2 infection and should not be used as the sole  basis for treatment or other patient management decisions. A negative result may occur with  improper specimen collection/handling, submission of specimen other than nasopharyngeal swab, presence of viral mutation(s) within the areas targeted by this assay, and inadequate number of viral copies(<138 copies/mL). A negative result must be combined with clinical observations, patient history, and epidemiological information. The expected result is Negative.  Fact Sheet for Patients:  EntrepreneurPulse.com.au  Fact Sheet for Healthcare Providers:  IncredibleEmployment.be  This test is no t yet approved or cleared by the [place] FDA and  has been authorized for detection and/or diagnosis of SARS-CoV-2 by FDA under an Emergency Use Authorization (EUA). This EUA will remain  in effect (meaning this test can be used) for the duration of the COVID-19 declaration under Section 564(b)(1) of the Act, 21 U.S.C.section 360bbb-3(b)(1), unless the authorization is terminated  or revoked sooner.       Influenza A by PCR NEGATIVE NEGATIVE Final   Influenza B by PCR  NEGATIVE NEGATIVE Final    Comment: (NOTE) The Xpert Xpress SARS-CoV-2/FLU/RSV plus assay is intended as an aid in the diagnosis of influenza from Nasopharyngeal swab specimens and should not be used as a sole basis for treatment. Nasal washings and aspirates are unacceptable for Xpert Xpress SARS-CoV-2/FLU/RSV testing.  Fact Sheet for Patients: EntrepreneurPulse.com.au  Fact Sheet for Healthcare Providers: IncredibleEmployment.be  This test is not yet approved or cleared by the [place] FDA and has been authorized for detection and/or diagnosis of SARS-CoV-2 by FDA under an Emergency Use Authorization (EUA). This EUA will remain in effect (meaning this test can be used) for the duration of the COVID-19 declaration under Section 564(b)(1) of the Act,  21 U.S.C. section 360bbb-3(b)(1), unless the authorization is terminated or revoked.  Performed at [facility], [address], [place], [place] [address]          Radiology Studies: US Venous Img Upper Uni Right(DVT)  Result Date: 12/28/2020 CLINICAL DATA:  Right arm swelling EXAM: RIGHT UPPER EXTREMITY VENOUS DOPPLER ULTRASOUND TECHNIQUE: Gray-scale sonography with graded compression, as well as color Doppler and duplex ultrasound were performed to evaluate the upper extremity deep venous system from the level of the subclavian vein and including the jugular, axillary, basilic, radial, ulnar and upper cephalic vein. Spectral Doppler was utilized to evaluate flow at rest and with distal augmentation maneuvers. COMPARISON:  None. FINDINGS: Contralateral Subclavian Vein: Respiratory phasicity is normal and symmetric with the symptomatic side. No evidence of thrombus. Normal compressibility. Internal Jugular Vein: No evidence of thrombus. Normal compressibility, respiratory phasicity and response to augmentation. Subclavian Vein: No evidence of thrombus. Normal compressibility, respiratory phasicity and response to augmentation. Axillary Vein: No evidence of thrombus. Normal compressibility, respiratory phasicity and response to augmentation. Cephalic Vein: Occlusive thrombus seen in the left cephalic vein from the shoulder to the antecubital region. Basilic Vein: No evidence of thrombus. Normal compressibility, respiratory phasicity and response to augmentation. Brachial Veins: No evidence of thrombus. Normal compressibility, respiratory phasicity and response to augmentation. Radial Veins: No evidence of thrombus. Normal compressibility, respiratory phasicity and response to augmentation. Ulnar Veins: No evidence of thrombus. Normal compressibility, respiratory phasicity and response to augmentation. Venous Reflux:  None visualized. Other Findings:  None visualized. IMPRESSION: Occlusive  thrombus in the right upper arm cephalic vein from the shoulder to the elbow. Electronically Signed   By: Rolm Baptise M.D.   On: 12/28/2020 22:44        Scheduled Meds:  insulin aspart  0-15 Units Subcutaneous TID WC   metoprolol tartrate  50 mg Oral BID   pantoprazole (PROTONIX) IV  40 mg Intravenous Q12H   torsemide  20 mg Oral Daily   Continuous Infusions:     LOS: 2 days    Time spent: 25 minutes    Sidney Ace, MD [facility]   If 7PM-7AM, please contact night-coverage  12/30/2020, 11:01 AM

## 2020-12-31 ENCOUNTER — Encounter: Payer: Self-pay | Admitting: Dermatology

## 2020-12-31 LAB — CBC WITH DIFFERENTIAL/PLATELET
Abs Immature Granulocytes: 0.02 10*3/uL (ref 0.00–0.07)
Basophils Absolute: 0 10*3/uL (ref 0.0–0.1)
Basophils Relative: 0 %
Eosinophils Absolute: 0 10*3/uL (ref 0.0–0.5)
Eosinophils Relative: 0 %
HCT: 22.3 % — ABNORMAL LOW (ref 39.0–52.0)
Hemoglobin: 7.5 g/dL — ABNORMAL LOW (ref 13.0–17.0)
Immature Granulocytes: 1 %
Lymphocytes Relative: 8 %
Lymphs Abs: 0.1 10*3/uL — ABNORMAL LOW (ref 0.7–4.0)
MCH: 31.9 pg (ref 26.0–34.0)
MCHC: 33.6 g/dL (ref 30.0–36.0)
MCV: 94.9 fL (ref 80.0–100.0)
Monocytes Absolute: 0.4 10*3/uL (ref 0.1–1.0)
Monocytes Relative: 21 %
Neutro Abs: 1.3 10*3/uL — ABNORMAL LOW (ref 1.7–7.7)
Neutrophils Relative %: 70 %
Platelets: 329 10*3/uL (ref 150–400)
RBC: 2.35 MIL/uL — ABNORMAL LOW (ref 4.22–5.81)
RDW: 18.7 % — ABNORMAL HIGH (ref 11.5–15.5)
WBC: 1.8 10*3/uL — ABNORMAL LOW (ref 4.0–10.5)
nRBC: 0 % (ref 0.0–0.2)

## 2020-12-31 LAB — IGG, IGA, IGM
IgA: <5 mg/dL — ABNORMAL LOW (ref 90–386)
IgG (Immunoglobin G), Serum: 1333 mg/dL (ref 603–1613)
IgM (Immunoglobulin M), Srm: 117 mg/dL (ref 20–172)

## 2020-12-31 LAB — GLUCOSE, CAPILLARY
Glucose-Capillary: 193 mg/dL — ABNORMAL HIGH (ref 70–99)
Glucose-Capillary: 215 mg/dL — ABNORMAL HIGH (ref 70–99)
Glucose-Capillary: 203 mg/dL — ABNORMAL HIGH (ref 70–99)
Glucose-Capillary: 225 mg/dL — ABNORMAL HIGH (ref 70–99)

## 2020-12-31 MED ORDER — INSULIN GLARGINE-YFGN 100 UNIT/ML ~~LOC~~ SOLN
15.0000 [IU] | Freq: Every day | SUBCUTANEOUS | Status: DC
  Administered 2020-12-31: 15 [IU] via SUBCUTANEOUS
  Filled 2020-12-31 (×2): qty 0.15

## 2020-12-31 MED ORDER — AMLODIPINE BESYLATE 10 MG PO TABS
10.0000 mg | ORAL_TABLET | Freq: Every day | ORAL | Status: DC
  Administered 2020-12-31 – 2021-01-04 (×5): 10 mg via ORAL
  Filled 2020-12-31 (×5): qty 1

## 2020-12-31 MED ORDER — RAMIPRIL 10 MG PO CAPS
10.0000 mg | ORAL_CAPSULE | Freq: Two times a day (BID) | ORAL | Status: DC
  Administered 2020-12-31 – 2021-01-04 (×8): 10 mg via ORAL
  Filled 2020-12-31 (×9): qty 1

## 2020-12-31 NOTE — Progress Notes (Signed)
PROGRESS NOTE    Patrick Duncan  YIF:[id number] DOB: 1965/11/18 DOA: 12/28/2020 PCP: Sofie Hartigan, MD    Brief Narrative:   55 y.o. male seen in ed with complaints of hemoglobin of 6.2.  Patient coming to [place] from home/ pcp office.  Patient was recently admitted on 12/21/2020 with bright red blood per rectum patient does use Aleve which I have asked him to refrain from using Aleve and any NSAIDs. Patient states that other than weakness he was actually doing well and was not feeling bad in any way.  Patient denies any headaches blurred vision chest pain shortness of breath nausea vomiting.  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   Pt has past medical history of arthritis, cellulitis, depression, diabetes mellitus type 2, hypertension, IgA deficiency, morbid obesity, sleep apnea, hyperlipidemia.  Patient was admitted on 12 -6-22 fro anemia and had transfusion and egd and colonoscopy.   12/14: Hemoglobin dropped to 5.5.  Ordered 2 units transfusion however pending arrival of washed PRBCs from outside facility.  Patient hemodynamically stable.  Discussed with GI.  Recent colonoscopy and EGD.  So we will proceed with video capsule endoscopy today  12/16: Capsule study negative.  Unclear source of blood loss.  Considering unexplained anemia and leukopenia discussed with hematology.  Decision made to proceed with bone marrow biopsy.  Inpatient versus outpatient.   Assessment & Plan:   Principal Problem:   Acute blood loss anemia Active Problems:   Diabetes (HCC)   HTN (hypertension)   OSA (obstructive sleep apnea)   AKI (acute kidney injury) ([place])   GI bleed   Acute on chronic blood loss anemia   Acute on chronic anemia Suspected GI bleed Likely GI source however recent colonoscopy and EGD negative for acute bleed Discussed with GI, appreciate consultation Patient needs washed  PRBC Transfused 2 units on 12/14 S/p video endoscopy, results negative  Plan: monitor hemoglobin.  Transfuse as needed less than 7  Bicytopenia Unclear etiology.  Patient with anemia and leukopenia.  Chronicity unclear as well.  Discussed with hematology Plan: Recommend bone marrow biopsy.  Inpatient versus outpatient.  We will plan to monitor for next 24 hours.  If hemoglobin stable on 12/17 may be able to discharge home and outpatient follow-up in cancer center Monday morning.  History of IgA deficiency Per patient this was diagnosed after recurrent illnesses as a child Does not see immunology or hematology as outpatient No specific immunodeficiency medications Plan: Hematology recs appreciated Immunoglobulin panel sent and pending If IgA def confirmed, will need referral to tertiary center  Essential hypertension Blood pressure reasonably well controlled over interval Likely in the setting of acute blood loss anemia At home patient takes amlodipine, ramipril, Aldactone, torsemide, metoprolol titrate Plan: Continue metoprolol titrate at home dose of 50 mg twice daily Continue Demadex 20 mg daily Resume amlodipine Resume ramipril  Type 2 diabetes mellitus Oral agents on hold Moderate sliding scale Hypoglycemia protocol Add Lantus once diet advanced  Acute kidney injury Avoid nonessential nephrotoxins Monitor kidney function  Morbid obesity BMI 54 This complicates overall care and prognosis Request RD consultation    DVT prophylaxis: SCD Code Status: Full Family Communication: Spouse at bedside 12/14, father at bedside 12/15, spouse at bedside 12/16 Disposition Plan: Status is: Inpatient  Remains inpatient appropriate because: Acute on chronic blood loss anemia.  Leukopenia of unclear etiology.  Monitor hemoglobin overnight.  Possible discharge 12/17 if hemoglobin  stable.       Level of care: Telemetry Medical  Consultants:  GI Hematology  Procedures:   Video capsule endoscopy 12/14  Antimicrobials: None   Subjective: Seen and examined.  Resting in bed comfortably.  No pain complaints.  Objective: Vitals:   12/30/20 1651 12/30/20 1943 12/31/20 0421 12/31/20 0842  BP: (!) 162/59 (!) 113/58 (!) 121/53 (!) 157/59  Pulse: 70 80 74 97  Resp: 18 19 17    Temp: 97.8 F (36.6 C) 98 F (36.7 C) 97.7 F (36.5 C) 97.8 F (36.6 C)  TempSrc: Oral Oral    SpO2: 99% 99% 98% 98%  Weight:      Height:       No intake or output data in the 24 hours ending 12/31/20 1206  Filed Weights   12/28/20 1826  Weight: (!) 158.3 kg    Examination:  General exam: No acute distress Respiratory system: Clear to auscultation.  Normal work of breathing.  Room air Cardiovascular system: S1-S2, RRR, no murmurs Gastrointestinal system: Obese, NT/ND, normal bowel sounds Central nervous system: Alert and oriented. No focal neurological deficits. Extremities: Symmetric 5 x 5 power. Skin: No rashes, lesions or ulcers Psychiatry: Judgement and insight appear normal. Mood & affect appropriate.     Data Reviewed: I have personally reviewed following labs and imaging studies  CBC: Recent Labs  Lab 12/28/20 2116 12/28/20 2340 12/29/20 0442 12/29/20 1940 12/30/20 0501 12/31/20 0345  WBC 2.1*  --  1.5* 2.2* 1.6* 1.8*  NEUTROABS 1.7  --   --   --   --  1.3*  HGB 5.8* 5.6* 5.5* 7.3* 7.4* 7.5*  HCT 17.0* 16.8* 16.4* 21.0* 21.9* 22.3*  MCV 97.1  --  97.6 92.5 95.2 94.9  PLT 255  --  250 286 294 829   Basic Metabolic Panel: Recent Labs  Lab 12/28/20 2116 12/29/20 0442 12/30/20 0501  NA 132* 134* 137  K 4.5 4.1 4.1  CL 103 105 106  CO2 22 22 24   GLUCOSE 132* 110* 125*  BUN 92* 83* 48*  CREATININE 2.06* 1.55* 1.19  CALCIUM 8.8* 8.7* 9.2   GFR: Estimated Creatinine Clearance: 104.6 mL/min (by C-G formula based on SCr of 1.19 mg/dL). Liver Function Tests: Recent Labs  Lab 12/28/20 2116 12/29/20 0442  AST 20 21  ALT 17 17  ALKPHOS 35*  33*  BILITOT 1.2 1.2  PROT 6.8 6.4*  ALBUMIN 3.5 3.4*   No results for input(s): LIPASE, AMYLASE in the last 168 hours. No results for input(s): AMMONIA in the last 168 hours. Coagulation Profile: No results for input(s): INR, PROTIME in the last 168 hours. Cardiac Enzymes: No results for input(s): CKTOTAL, CKMB, CKMBINDEX, TROPONINI in the last 168 hours. BNP (last 3 results) No results for input(s): PROBNP in the last 8760 hours. HbA1C: No results for input(s): HGBA1C in the last 72 hours. CBG: Recent Labs  Lab 12/30/20 0754 12/30/20 1221 12/30/20 1648 12/30/20 1946 12/31/20 0751  GLUCAP 139* 148* 182* 286* 193*   Lipid Profile: No results for input(s): CHOL, HDL, LDLCALC, TRIG, CHOLHDL, LDLDIRECT in the last 72 hours. Thyroid Function Tests: No results for input(s): TSH, T4TOTAL, FREET4, T3FREE, THYROIDAB in the last 72 hours. Anemia Panel: Recent Labs    12/29/20 0442  RETICCTPCT 2.8   Sepsis Labs: No results for input(s): PROCALCITON, LATICACIDVEN in the last 168 hours.  Recent Results (from the past 240 hour(s))  Resp Panel by RT-PCR (Flu A&B, Covid) Nasopharyngeal Swab  Status: None   Collection Time: 12/21/20  5:11 PM   Specimen: Nasopharyngeal Swab; Nasopharyngeal(NP) swabs in vial transport medium  Result Value Ref Range Status   SARS Coronavirus 2 by RT PCR NEGATIVE NEGATIVE Final    Comment: (NOTE) SARS-CoV-2 target nucleic acids are NOT DETECTED.  The SARS-CoV-2 RNA is generally detectable in upper respiratory specimens during the acute phase of infection. The lowest concentration of SARS-CoV-2 viral copies this assay can detect is 138 copies/mL. A negative result does not preclude SARS-Cov-2 infection and should not be used as the sole basis for treatment or other patient management decisions. A negative result may occur with  improper specimen collection/handling, submission of specimen other than nasopharyngeal swab, presence of viral  mutation(s) within the areas targeted by this assay, and inadequate number of viral copies(<138 copies/mL). A negative result must be combined with clinical observations, patient history, and epidemiological information. The expected result is Negative.  Fact Sheet for Patients:  EntrepreneurPulse.com.au  Fact Sheet for Healthcare Providers:  IncredibleEmployment.be  This test is no t yet approved or cleared by the [place] FDA and  has been authorized for detection and/or diagnosis of SARS-CoV-2 by FDA under an Emergency Use Authorization (EUA). This EUA will remain  in effect (meaning this test can be used) for the duration of the COVID-19 declaration under Section 564(b)(1) of the Act, 21 U.S.C.section 360bbb-3(b)(1), unless the authorization is terminated  or revoked sooner.       Influenza A by PCR NEGATIVE NEGATIVE Final   Influenza B by PCR NEGATIVE NEGATIVE Final    Comment: (NOTE) The Xpert Xpress SARS-CoV-2/FLU/RSV plus assay is intended as an aid in the diagnosis of influenza from Nasopharyngeal swab specimens and should not be used as a sole basis for treatment. Nasal washings and aspirates are unacceptable for Xpert Xpress SARS-CoV-2/FLU/RSV testing.  Fact Sheet for Patients: EntrepreneurPulse.com.au  Fact Sheet for Healthcare Providers: IncredibleEmployment.be  This test is not yet approved or cleared by the [place] FDA and has been authorized for detection and/or diagnosis of SARS-CoV-2 by FDA under an Emergency Use Authorization (EUA). This EUA will remain in effect (meaning this test can be used) for the duration of the COVID-19 declaration under Section 564(b)(1) of the Act, 21 U.S.C. section 360bbb-3(b)(1), unless the authorization is terminated or revoked.  Performed at [facility], [address] [place], [place] [address]   Resp Panel by RT-PCR (Flu A&B,  Covid) Nasopharyngeal Swab     Status: None   Collection Time: 12/28/20  9:16 PM   Specimen: Nasopharyngeal Swab; Nasopharyngeal(NP) swabs in vial transport medium  Result Value Ref Range Status   SARS Coronavirus 2 by RT PCR NEGATIVE NEGATIVE Final    Comment: (NOTE) SARS-CoV-2 target nucleic acids are NOT DETECTED.  The SARS-CoV-2 RNA is generally detectable in upper respiratory specimens during the acute phase of infection. The lowest concentration of SARS-CoV-2 viral copies this assay can detect is 138 copies/mL. A negative result does not preclude SARS-Cov-2 infection and should not be used as the sole basis for treatment or other patient management decisions. A negative result may occur with  improper specimen collection/handling, submission of specimen other than nasopharyngeal swab, presence of viral mutation(s) within the areas targeted by this assay, and inadequate number of viral copies(<138 copies/mL). A negative result must be combined with clinical observations, patient history, and epidemiological information. The expected result is Negative.  Fact Sheet for Patients:  EntrepreneurPulse.com.au  Fact Sheet for Healthcare Providers:  IncredibleEmployment.be  This test is no t yet approved or cleared by the [place] and  has been authorized for detection and/or diagnosis of SARS-CoV-2 by FDA under an Emergency Use Authorization (EUA). This EUA will remain  in effect (meaning this test can be used) for the duration of the COVID-19 declaration under Section 564(b)(1) of the Act, 21 U.S.C.section 360bbb-3(b)(1), unless the authorization is terminated  or revoked sooner.       Influenza A by PCR NEGATIVE NEGATIVE Final   Influenza B by PCR NEGATIVE NEGATIVE Final    Comment: (NOTE) The Xpert Xpress SARS-CoV-2/FLU/RSV plus assay is intended as an aid in the diagnosis of influenza from Nasopharyngeal swab specimens and should  not be used as a sole basis for treatment. Nasal washings and aspirates are unacceptable for Xpert Xpress SARS-CoV-2/FLU/RSV testing.  Fact Sheet for Patients: EntrepreneurPulse.com.au  Fact Sheet for Healthcare Providers: IncredibleEmployment.be  This test is not yet approved or cleared by the [place] FDA and has been authorized for detection and/or diagnosis of SARS-CoV-2 by FDA under an Emergency Use Authorization (EUA). This EUA will remain in effect (meaning this test can be used) for the duration of the COVID-19 declaration under Section 564(b)(1) of the Act, 21 U.S.C. section 360bbb-3(b)(1), unless the authorization is terminated or revoked.  Performed at [facility], [address], [place], [place] [address]          Radiology Studies: No results found.      Scheduled Meds:  insulin aspart  0-15 Units Subcutaneous TID WC   metoprolol tartrate  50 mg Oral BID   pantoprazole (PROTONIX) IV  40 mg Intravenous Q12H   torsemide  20 mg Oral Daily   Continuous Infusions:     LOS: 3 days    Time spent: 25 minutes    Sidney Ace, MD [facility]   If 7PM-7AM, please contact night-coverage  12/31/2020, 12:06 PM

## 2020-12-31 NOTE — Progress Notes (Signed)
[place]  Telephone:([phone]) [phone] Fax:(336) [phone]  ID: Patrick Duncan OB: August 01, 1965  MR#: [id number]  [id number]#:[id number]  Patient Care Team: Sofie Hartigan, MD as PCP - General (Family Medicine)  CHIEF COMPLAINT: Anemia/leukopenia  INTERVAL HISTORY: Patrick Duncan is a 55 year old male with past medical history of arthritis, cellulitis, depression, diabetes type 2, hypertension, IgA deficiency, morbid obesity, sleep apnea and hyperlipidemia who was recently admitted to [facility] for anemia and hemoglobin of 6.2.  He was sent from his PCPs office to the emergency room.  He was recently admitted to the hospital on 12/21/2020 for bright red blood per rectum.  Admitted to use of Aleve and other NSAIDs.  Has required irradiated PRBC due to his IgA deficiency and developing blood transfusion reactions during his previous admission.  Reports as a child having extensive work-up for frequent sinus infections.  His pediatrician sent him to [facility] and he was diagnosed with IgA deficiency.  He followed for several years but eventually stopped.  States now he follows with his PCP.  Reports frequent sinus infections but overall is feels healthy.   He received 1 unit PRBC with a drop in his hemoglobin intially but now his counts have improved and are stable around 7.8.  Reticulocyte panel showed absolute retic count of 45.9.    GI was consulted.  He had a colonoscopy/EGD with previous admission which showed nonbleeding internal hemorrhoids, normal esophagus, normal stomach and no specimens were collected. Video Capsule study was completed during this admission and was negative.   Presently he is feeling well.  Denies any bleeding.  REVIEW OF SYSTEMS:   Review of Systems  Constitutional:  Positive for malaise/fatigue. Negative for chills, fever and weight loss.  HENT:  Negative for congestion, ear pain and tinnitus.   Eyes: Negative.  Negative for blurred vision and double vision.   Respiratory: Negative.  Negative for cough, sputum production and shortness of breath.   Cardiovascular: Negative.  Negative for chest pain, palpitations and leg swelling.  Gastrointestinal: Negative.  Negative for abdominal pain, constipation, diarrhea, nausea and vomiting.  Genitourinary:  Negative for dysuria, frequency and urgency.  Musculoskeletal:  Negative for back pain and falls.  Skin: Negative.  Negative for rash.  Neurological: Negative.  Negative for weakness and headaches.  Endo/Heme/Allergies: Negative.  Does not bruise/bleed easily.  Psychiatric/Behavioral:  Negative for depression. The patient is nervous/anxious. The patient does not have insomnia.    As per HPI. Otherwise, a complete review of systems is negative.  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[place])    Morbid obesity ([place])    Sleep apnea     PAST SURGICAL HISTORY: Past Surgical History:  Procedure Laterality Date   COLONOSCOPY WITH PROPOFOL N/A 07/13/2020   Procedure: COLONOSCOPY WITH PROPOFOL;  Surgeon: Lesly Rubenstein, MD;  Location: [facility] ENDOSCOPY;  Service: Endoscopy;  Laterality: N/A;   COLONOSCOPY WITH PROPOFOL N/A 12/23/2020   Procedure: COLONOSCOPY WITH PROPOFOL;  Surgeon: Lucilla Lame, MD;  Location: [facility] ENDOSCOPY;  Service: Endoscopy;  Laterality: N/A;   ESOPHAGOGASTRODUODENOSCOPY (EGD) WITH PROPOFOL N/A 12/23/2020   Procedure: ESOPHAGOGASTRODUODENOSCOPY (EGD) WITH PROPOFOL;  Surgeon: Lucilla Lame, MD;  Location: [facility] ENDOSCOPY;  Service: Endoscopy;  Laterality:  N/A;   GIVENS CAPSULE STUDY N/A 12/29/2020   Procedure: GIVENS CAPSULE STUDY;  Surgeon: Lin Landsman, MD;  Location: [facility] ENDOSCOPY;  Service: Gastroenterology;  Laterality: N/A;   TOOTH EXTRACTION       FAMILY HISTORY: Family History  Problem Relation Age of Onset   Benign prostatic hyperplasia Father    Psoriasis Father    Hypertension Mother    Hyperlipidemia Mother    Diabetes Maternal Grandmother    Diabetes Paternal Grandmother     ADVANCED DIRECTIVES (Y/N):  _0 @  HEALTH MAINTENANCE: Social History   Tobacco Use   Smoking status: Never   Smokeless tobacco: Never  Vaping Use   Vaping Use: Never used  Substance Use Topics   Alcohol use: No   Drug use: No     Colonoscopy:  PAP:  Bone density:  Lipid panel:  Allergies  Allergen Reactions   Ampicillin Diarrhea   Bactrim [Sulfamethoxazole-Trimethoprim] Hives   Cephalexin    Claritin [Loratadine] Other (See Comments)    Irritates throat   Penicillins Rash    Current Facility-Administered Medications  Medication Dose Route Frequency Provider Last Rate Last Admin   acetaminophen (TYLENOL) tablet 650 mg  650 mg Oral Q6H PRN Para Skeans, MD       Or   acetaminophen (TYLENOL) suppository 650 mg  650 mg Rectal Q6H PRN Para Skeans, MD       diphenhydrAMINE (BENADRYL) injection 25 mg  25 mg Intravenous Q6H PRN Priscella Mann, Sudheer B, MD   25 mg at 12/29/20 1120   insulin aspart (novoLOG) injection 0-15 Units  0-15 Units Subcutaneous TID WC Para Skeans, MD   3 Units at 12/31/20 0756   metoprolol tartrate (LOPRESSOR) tablet 50 mg  50 mg Oral BID Ralene Muskrat B, MD   50 mg at 12/30/20 0811   morphine 2 MG/ML injection 2 mg  2 mg Intravenous Q2H PRN Para Skeans, MD       ondansetron (ZOFRAN) tablet 4 mg  4 mg Oral Q6H PRN Para Skeans, MD       Or   ondansetron (ZOFRAN) injection 4 mg  4 mg Intravenous Q6H PRN Para Skeans, MD       pantoprazole (PROTONIX) injection 40 mg  40 mg Intravenous Q12H Ralene Muskrat B, MD   40 mg at 12/30/20 2031   torsemide (DEMADEX) tablet 20 mg  20 mg Oral Daily Ralene Muskrat B, MD   20 mg at 12/30/20 0812    OBJECTIVE: Vitals:   12/30/20 1943 12/31/20 0421   BP: (!) 113/58 (!) 121/53  Pulse: 80 74  Resp: 19 17  Temp: 98 F (36.7 C) 97.7 F (36.5 C)  SpO2: 99% 98%     Body mass index is 54.66 kg/m.    ECOG FS:1 - Symptomatic but completely ambulatory  Physical Exam Constitutional:      Appearance: Normal appearance. He is obese.  HENT:     Head: Normocephalic and atraumatic.  Eyes:     Pupils: Pupils are equal, round, and reactive to light.  Cardiovascular:     Rate and Rhythm: Normal rate and regular rhythm.     Heart sounds: Normal heart sounds. No murmur heard. Pulmonary:     Effort: Pulmonary effort is normal.     Breath sounds: Normal breath sounds. No wheezing.  Abdominal:     General: Bowel sounds are normal. There is no distension.     Palpations: Abdomen is soft.  Tenderness: There is no abdominal tenderness.  Musculoskeletal:        General: Normal range of motion.     Cervical back: Normal range of motion.  Skin:    General: Skin is warm and dry.     Findings: No rash.  Neurological:     Mental Status: He is alert and oriented to person, place, and time.  Psychiatric:        Judgment: Judgment normal.     LAB RESULTS:  Lab Results  Component Value Date   NA 137 12/30/2020   K 4.1 12/30/2020   CL 106 12/30/2020   CO2 24 12/30/2020   GLUCOSE 125 (H) 12/30/2020   BUN 48 (H) 12/30/2020   CREATININE 1.19 12/30/2020   CALCIUM 9.2 12/30/2020   PROT 6.4 (L) 12/29/2020   ALBUMIN 3.4 (L) 12/29/2020   AST 21 12/29/2020   ALT 17 12/29/2020   ALKPHOS 33 (L) 12/29/2020   BILITOT 1.2 12/29/2020   GFRNONAA >60 12/30/2020   GFRAA >60 10/25/2016    Lab Results  Component Value Date   WBC 1.8 (L) 12/31/2020   NEUTROABS 1.3 (L) 12/31/2020   HGB 7.5 (L) 12/31/2020   HCT 22.3 (L) 12/31/2020   MCV 94.9 12/31/2020   PLT 329 12/31/2020     STUDIES: DG Chest 2 View  Result Date: 12/14/2020 CLINICAL DATA:  Short of breath and hyperglycemia EXAM: CHEST - 2 VIEW COMPARISON:  None. FINDINGS: Normal  mediastinum and cardiac silhouette. Normal pulmonary vasculature. No evidence of effusion, infiltrate, or pneumothorax. No acute bony abnormality. IMPRESSION: No acute cardiopulmonary process. Electronically Signed   By: Suzy Bouchard M.D.   On: 12/14/2020 12:20   US Venous Img Upper Uni Right(DVT)  Result Date: 12/28/2020 CLINICAL DATA:  Right arm swelling EXAM: RIGHT UPPER EXTREMITY VENOUS DOPPLER ULTRASOUND TECHNIQUE: Gray-scale sonography with graded compression, as well as color Doppler and duplex ultrasound were performed to evaluate the upper extremity deep venous system from the level of the subclavian vein and including the jugular, axillary, basilic, radial, ulnar and upper cephalic vein. Spectral Doppler was utilized to evaluate flow at rest and with distal augmentation maneuvers. COMPARISON:  None. FINDINGS: Contralateral Subclavian Vein: Respiratory phasicity is normal and symmetric with the symptomatic side. No evidence of thrombus. Normal compressibility. Internal Jugular Vein: No evidence of thrombus. Normal compressibility, respiratory phasicity and response to augmentation. Subclavian Vein: No evidence of thrombus. Normal compressibility, respiratory phasicity and response to augmentation. Axillary Vein: No evidence of thrombus. Normal compressibility, respiratory phasicity and response to augmentation. Cephalic Vein: Occlusive thrombus seen in the left cephalic vein from the shoulder to the antecubital region. Basilic Vein: No evidence of thrombus. Normal compressibility, respiratory phasicity and response to augmentation. Brachial Veins: No evidence of thrombus. Normal compressibility, respiratory phasicity and response to augmentation. Radial Veins: No evidence of thrombus. Normal compressibility, respiratory phasicity and response to augmentation. Ulnar Veins: No evidence of thrombus. Normal compressibility, respiratory phasicity and response to augmentation. Venous Reflux:  None  visualized. Other Findings:  None visualized. IMPRESSION: Occlusive thrombus in the right upper arm cephalic vein from the shoulder to the elbow. Electronically Signed   By: Rolm Baptise M.D.   On: 12/28/2020 22:44   ECHOCARDIOGRAM COMPLETE  Result Date: 12/23/2020    ECHOCARDIOGRAM REPORT   Patient Name:   Patrick Duncan Date of Exam: 12/23/2020 Medical Rec #:  [id number]         Height:       67.0 in Accession #:  [id number]        Weight:       349.0 lb Date of Birth:  1965-11-03        BSA:          2.562 m Patient Age:    53 years          BP:           129/44 mmHg Patient Gender: M                 HR:           82 bpm. Exam Location:  [facility] Procedure: 2D Echo, Cardiac Doppler and Color Doppler Indications:     Pericardial Effusion I31.3  History:         Patient has no prior history of Echocardiogram examinations.                  Risk Factors:Diabetes, Hypertension, Dyslipidemia and Sleep                  Apnea.  Sonographer:     Sherrie Sport Referring Phys:  [id number] [facility] AMIN Diagnosing Phys: Ida Rogue MD  Sonographer Comments: Suboptimal apical window and no subcostal window. IMPRESSIONS  1. Left ventricular ejection fraction, by estimation, is 60 to 65%. The left ventricle has normal function. The left ventricle has no regional wall motion abnormalities. There is moderate left ventricular hypertrophy. Left ventricular diastolic parameters are consistent with Grade II diastolic dysfunction (pseudonormalization).  2. Right ventricular systolic function is normal. The right ventricular size is normal. There is normal pulmonary artery systolic pressure. The estimated right ventricular systolic pressure is 60.6 mmHg.  3. Left atrial size was mildly dilated.  4. A small to moderate noncircumferential pericardial effusion is present.No tamponade.  5. The mitral valve is normal in structure. No evidence of mitral valve regurgitation. No evidence of mitral stenosis.  6. The aortic valve was not well  visualized. Aortic valve regurgitation is not visualized. No aortic stenosis is present.  7. The inferior vena cava is normal in size with greater than 50% respiratory variability, suggesting right atrial pressure of 3 mmHg. FINDINGS  Left Ventricle: Left ventricular ejection fraction, by estimation, is 60 to 65%. The left ventricle has normal function. The left ventricle has no regional wall motion abnormalities. The left ventricular internal cavity size was normal in size. There is  moderate left ventricular hypertrophy. Left ventricular diastolic parameters are consistent with Grade II diastolic dysfunction (pseudonormalization). Right Ventricle: The right ventricular size is normal. No increase in right ventricular wall thickness. Right ventricular systolic function is normal. There is normal pulmonary artery systolic pressure. The tricuspid regurgitant velocity is 1.59 m/s, and  with an assumed right atrial pressure of 5 mmHg, the estimated right ventricular systolic pressure is 30.1 mmHg. Left Atrium: Left atrial size was mildly dilated. Right Atrium: Right atrial size was normal in size. Pericardium: A small pericardial effusion is present. There is no evidence of cardiac tamponade. Mitral Valve: The mitral valve is normal in structure. No evidence of mitral valve regurgitation. No evidence of mitral valve stenosis. MV peak gradient, 5.7 mmHg. The mean mitral valve gradient is 3.0 mmHg. Tricuspid Valve: The tricuspid valve is normal in structure. Tricuspid valve regurgitation is mild . No evidence of tricuspid stenosis. Aortic Valve: The aortic valve was not well visualized. Aortic valve regurgitation is not visualized. No aortic stenosis is present. Aortic valve mean gradient measures 4.0 mmHg. Aortic valve peak gradient measures  7.4 mmHg. Aortic valve area, by VTI measures 4.99 cm. Pulmonic Valve: The pulmonic valve was normal in structure. Pulmonic valve regurgitation is not visualized. No evidence of  pulmonic stenosis. Aorta: The aortic root is normal in size and structure. Venous: The inferior vena cava is normal in size with greater than 50% respiratory variability, suggesting right atrial pressure of 3 mmHg. IAS/Shunts: No atrial level shunt detected by color flow Doppler.  LEFT VENTRICLE PLAX 2D LVIDd:         5.70 cm   Diastology LVIDs:         3.50 cm   LV e' medial:    6.64 cm/s LV PW:         1.40 cm   LV E/e' medial:  18.4 LV IVS:        1.20 cm   LV e' lateral:   10.30 cm/s LVOT diam:     2.20 cm   LV E/e' lateral: 11.8 LV SV:         111 LV SV Index:   43 LVOT Area:     3.80 cm  RIGHT VENTRICLE RV Basal diam:  5.00 cm RV S prime:     14.90 cm/s TAPSE (M-mode): 5.2 cm LEFT ATRIUM              Index        RIGHT ATRIUM           Index LA diam:        4.70 cm  1.83 cm/m   RA Area:     46.30 cm LA Vol (A2C):   126.0 ml 49.18 ml/m  RA Volume:   221.00 ml 86.26 ml/m LA Vol (A4C):   105.0 ml 40.98 ml/m LA Biplane Vol: 118.0 ml 46.06 ml/m  AORTIC VALVE                    PULMONIC VALVE AV Area (Vmax):    4.64 cm     PV Vmax:        1.06 m/s AV Area (Vmean):   3.99 cm     PV Vmean:       82.200 cm/s AV Area (VTI):     4.99 cm     PV VTI:         0.204 m AV Vmax:           136.00 cm/s  PV Peak grad:   4.5 mmHg AV Vmean:          92.900 cm/s  PV Mean grad:   3.0 mmHg AV VTI:            0.222 m      RVOT Peak grad: 7 mmHg AV Peak Grad:      7.4 mmHg AV Mean Grad:      4.0 mmHg LVOT Vmax:         166.00 cm/s LVOT Vmean:        97.500 cm/s LVOT VTI:          0.291 m LVOT/AV VTI ratio: 1.31  AORTA Ao Root diam: 3.33 cm MITRAL VALVE                TRICUSPID VALVE MV Area (PHT): 3.33 cm     TR Peak grad:   10.1 mmHg MV Area VTI:   3.76 cm     TR Vmax:        159.00 cm/s MV Peak grad:  5.7 mmHg MV Mean  grad:  3.0 mmHg     SHUNTS MV Vmax:       1.19 m/s     Systemic VTI:  0.29 m MV Vmean:      82.0 cm/s    Systemic Diam: 2.20 cm MV Decel Time: 228 msec     Pulmonic VTI:  0.260 m MV E velocity: 122.00 cm/s MV  A velocity: 121.00 cm/s MV E/A ratio:  1.01 Ida Rogue MD Electronically signed by Ida Rogue MD Signature Date/Time: 12/23/2020/10:15:34 AM    Final     ASSESSMENT: Patrick Duncan is a 55 year old male who is here for rectal bleeding and symptomatic anemia.  Has history of IgA deficiency as a child.   Discussed case with Dr. Grayland Ormond who recommends repeat immunoglobulins given IgA deficiency is rare.  Results are still pending.  Video capsule study was negative.  PLAN: Given no acute GI bleed and anemia and neutropenia would recommend bone marrow biopsy.  Discussed with patient who is agreeable.  This is scheduled for 01/03/2021.  At that time he will be discharged home as long as his counts are stable.  He will then follow-up with Dr. Grayland Ormond in 7 to 10 days to discuss the results and plan of care moving forward.  I spent 45 minutes dedicated to the care of this patient (face-to-face and non-face-to-face) on the date of the encounter to include what is described in the assessment and plan.  Patient expressed understanding and was in agreement with this plan. He also understands that He can call clinic at any time with any questions, concerns, or complaints.    Cancer Staging  No matching staging information was found for the patient.  Jacquelin Hawking, NP   12/31/2020 8:26 AM

## 2020-12-31 NOTE — Care Management Important Message (Signed)
Important Message  Patient Details  Name: Patrick Duncan MRN: [id number] Date of Birth: [birth year]   Medicare Important Message Given:  Yes     Juliann Pulse A Idonna Heeren 12/31/2020, 3:05 PM

## 2020-12-31 NOTE — Progress Notes (Signed)
Inpatient Diabetes Program Recommendations  AACE/ADA: New Consensus Statement on Inpatient Glycemic Control (2015)  Target Ranges:  Prepandial:   less than 140 mg/dL      Peak postprandial:   less than 180 mg/dL (1-2 hours)      Critically ill patients:  140 - 180 mg/dL   Lab Results  Component Value Date   GLUCAP 215 (H) 12/31/2020   HGBA1C 9.4 (H) 12/21/2020    Review of Glycemic Control  Latest Reference Range & Units 12/29/20 20:27 12/30/20 07:54 12/30/20 12:21 12/30/20 16:48 12/30/20 19:46 12/31/20 07:51 12/31/20 12:38  Glucose-Capillary 70 - 99 mg/dL 125 (H) 139 (H) 148 (H) 182 (H) 286 (H) 193 (H) 215 (H)   Diabetes history: DM 2 Outpatient Diabetes medications:  Metformin [phone] mg bid, Tresiba 30 units daily, Precose 25 mg tid Current orders for Inpatient glycemic control:  Novolog moderate tid with meals   Inpatient Diabetes Program Recommendations:    May consider restarting a portion of home basal insulin.  Consider Semglee 15 units daily (1/2 of home dose).  A1C was > goal, however with anemia, ? If this was accurate. Will follow.   Thanks,  Adah Perl, RN, BC-ADM Inpatient Diabetes Coordinator Pager [phone]  (8a-5p)

## 2021-01-01 LAB — CBC WITH DIFFERENTIAL/PLATELET
Abs Immature Granulocytes: 0.02 10*3/uL (ref 0.00–0.07)
Basophils Absolute: 0 10*3/uL (ref 0.0–0.1)
Basophils Relative: 1 %
Eosinophils Absolute: 0 10*3/uL (ref 0.0–0.5)
Eosinophils Relative: 0 %
HCT: 22.5 % — ABNORMAL LOW (ref 39.0–52.0)
Hemoglobin: 7.8 g/dL — ABNORMAL LOW (ref 13.0–17.0)
Immature Granulocytes: 1 %
Lymphocytes Relative: 10 %
Lymphs Abs: 0.2 10*3/uL — ABNORMAL LOW (ref 0.7–4.0)
MCH: 32.4 pg (ref 26.0–34.0)
MCHC: 34.7 g/dL (ref 30.0–36.0)
MCV: 93.4 fL (ref 80.0–100.0)
Monocytes Absolute: 0.3 10*3/uL (ref 0.1–1.0)
Monocytes Relative: 15 %
Neutro Abs: 1.6 10*3/uL — ABNORMAL LOW (ref 1.7–7.7)
Neutrophils Relative %: 73 %
Platelets: 335 10*3/uL (ref 150–400)
RBC: 2.41 MIL/uL — ABNORMAL LOW (ref 4.22–5.81)
RDW: 18.4 % — ABNORMAL HIGH (ref 11.5–15.5)
WBC: 2.1 10*3/uL — ABNORMAL LOW (ref 4.0–10.5)
nRBC: 0 % (ref 0.0–0.2)

## 2021-01-01 LAB — GLUCOSE, CAPILLARY
Glucose-Capillary: 225 mg/dL — ABNORMAL HIGH (ref 70–99)
Glucose-Capillary: 271 mg/dL — ABNORMAL HIGH (ref 70–99)
Glucose-Capillary: 256 mg/dL — ABNORMAL HIGH (ref 70–99)
Glucose-Capillary: 291 mg/dL — ABNORMAL HIGH (ref 70–99)

## 2021-01-01 MED ORDER — HYDROCORTISONE 1 % EX CREA
TOPICAL_CREAM | Freq: Two times a day (BID) | CUTANEOUS | Status: DC | PRN
  Filled 2021-01-01 (×2): qty 28

## 2021-01-01 MED ORDER — INSULIN GLARGINE-YFGN 100 UNIT/ML ~~LOC~~ SOLN
20.0000 [IU] | Freq: Every day | SUBCUTANEOUS | Status: DC
  Administered 2021-01-01: 22:00:00 20 [IU] via SUBCUTANEOUS
  Filled 2021-01-01 (×2): qty 0.2

## 2021-01-01 NOTE — Progress Notes (Signed)
PROGRESS NOTE    Patrick Duncan  EPP:[id number] DOB: [birth year] DOA: 12/28/2020 PCP: Sofie Hartigan, MD    Brief Narrative:   55 y.o. male seen in ed with complaints of hemoglobin of 6.2.  Patient coming to [place] from home/ pcp office.  Patient was recently admitted on 12/21/2020 with bright red blood per rectum patient does use Aleve which I have asked him to refrain from using Aleve and any NSAIDs. Patient states that other than weakness he was actually doing well and was not feeling bad in any way.  Patient denies any headaches blurred vision chest pain shortness of breath nausea vomiting.  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   Pt has past medical history of arthritis, cellulitis, depression, diabetes mellitus type 2, hypertension, IgA deficiency, morbid obesity, sleep apnea, hyperlipidemia.  Patient was admitted on 12 -6-22 fro anemia and had transfusion and egd and colonoscopy.   12/14: Hemoglobin dropped to 5.5.  Ordered 2 units transfusion however pending arrival of washed PRBCs from outside facility.  Patient hemodynamically stable.  Discussed with GI.  Recent colonoscopy and EGD.  So we will proceed with video capsule endoscopy today  12/16: Capsule study negative.  Unclear source of blood loss.  Considering unexplained anemia and leukopenia discussed with hematology.  Decision made to proceed with bone marrow biopsy.  Inpatient versus outpatient.  12/17: Patient hemoglobin relatively stable.  Patient remains leukopenic.  Unclear etiology.  Plan to proceed with inpatient bone marrow biopsy on Monday 12/19.   Assessment & Plan:   Principal Problem:   Acute blood loss anemia Active Problems:   Diabetes (HCC)   HTN (hypertension)   OSA (obstructive sleep apnea)   AKI (acute kidney injury) ([place])   GI bleed   Acute on chronic blood loss anemia   Acute on chronic  anemia Suspected GI bleed Likely GI source however recent colonoscopy and EGD negative for acute bleed Discussed with GI, appreciate consultation Patient needs washed PRBC Transfused 2 units on 12/14 S/p video endoscopy, results negative  Plan: Currently no indication for transfusion.  Monitor and transfuse as necessary hemoglobin less than 7  Pancytopenia Unclear etiology.  Patient with anemia and leukopenia.  Chronicity unclear as well.  Discussed with hematology Plan: Will proceed with bone marrow biopsy Monday12/19.   N.p.o. after midnight Sunday night  History of IgA deficiency Per patient this was diagnosed after recurrent illnesses as a child Does not see immunology or hematology as outpatient No specific immunodeficiency medications Plan: Hematology recs appreciated Immunoglobulin panel sent and pending If IgA def confirmed, will need referral to tertiary center This may be deferred to outpatient  Essential hypertension Blood pressure reasonably well controlled over interval Likely in the setting of acute blood loss anemia At home patient takes amlodipine, ramipril, Aldactone, torsemide, metoprolol titrate Plan: Continue metoprolol titrate at home dose of 50 mg twice daily Continue Demadex 20 mg daily Resume amlodipine Resume ramipril  Type 2 diabetes mellitus Oral agents on hold Moderate sliding scale Hypoglycemia protocol Add Lantus once diet advanced  Acute kidney injury Avoid nonessential nephrotoxins Monitor kidney function  Morbid obesity BMI 54 This complicates overall care and prognosis Request RD consultation    DVT prophylaxis: SCD Code Status: Full Family Communication: Spouse at bedside 12/14, father at bedside 12/15, spouse at bedside 12/16, spouse via phone [phone] on 12/17 Disposition Plan: Status is: Inpatient  Remains inpatient appropriate because: Pancytopenia of  unclear etiology.  Acute on chronic anemia.  Will monitor  hemoglobin.  Transfuse as necessary.  Bone marrow biopsy planned Monday 12/19       Level of care: Telemetry Medical  Consultants:  GI Hematology  Procedures:  Video capsule endoscopy 12/14  Antimicrobials: None   Subjective: Seen and examined.  Resting in bed comfortably.  No pain complaints.  Objective: Vitals:   12/31/20 1700 12/31/20 2029 01/01/21 0512 01/01/21 0725  BP: (!) 118/48 (!) 126/56 129/61 (!) 117/51  Pulse: 69 77 71 72  Resp: 18 18 16 18   Temp:  97.8 F (36.6 C) (!) 97.5 F (36.4 C) 97.6 F (36.4 C)  TempSrc:  Oral Oral Oral  SpO2: 98% 100% 100% 96%  Weight:      Height:        Intake/Output Summary (Last 24 hours) at 01/01/2021 1049 Last data filed at 01/01/2021 0900 Gross per 24 hour  Intake 240 ml  Output --  Net 240 ml    Filed Weights   12/28/20 1826  Weight: (!) 158.3 kg    Examination:  General exam: No apparent distress Respiratory system: Lungs clear.  Normal work of breathing.  Room air Cardiovascular system: S1-S2, RRR, no murmurs Gastrointestinal system: Obese, NT/ND, normal bowel sounds Central nervous system: Alert and oriented. No focal neurological deficits. Extremities: Symmetric 5 x 5 power. Skin: No rashes, lesions or ulcers Psychiatry: Judgement and insight appear normal. Mood & affect appropriate.     Data Reviewed: I have personally reviewed following labs and imaging studies  CBC: Recent Labs  Lab 12/28/20 2116 12/28/20 2340 12/29/20 0442 12/29/20 1940 12/30/20 0501 12/31/20 0345 01/01/21 0453  WBC 2.1*  --  1.5* 2.2* 1.6* 1.8* 2.1*  NEUTROABS 1.7  --   --   --   --  1.3* 1.6*  HGB 5.8*   < > 5.5* 7.3* 7.4* 7.5* 7.8*  HCT 17.0*   < > 16.4* 21.0* 21.9* 22.3* 22.5*  MCV 97.1  --  97.6 92.5 95.2 94.9 93.4  PLT 255  --  250 286 294 329 335   < > = values in this interval not displayed.   Basic Metabolic Panel: Recent Labs  Lab 12/28/20 2116 12/29/20 0442 12/30/20 0501  NA 132* 134* 137  K 4.5  4.1 4.1  CL 103 105 106  CO2 22 22 24   GLUCOSE 132* 110* 125*  BUN 92* 83* 48*  CREATININE 2.06* 1.55* 1.19  CALCIUM 8.8* 8.7* 9.2   GFR: Estimated Creatinine Clearance: 104.6 mL/min (by C-G formula based on SCr of 1.19 mg/dL). Liver Function Tests: Recent Labs  Lab 12/28/20 2116 12/29/20 0442  AST 20 21  ALT 17 17  ALKPHOS 35* 33*  BILITOT 1.2 1.2  PROT 6.8 6.4*  ALBUMIN 3.5 3.4*   No results for input(s): LIPASE, AMYLASE in the last 168 hours. No results for input(s): AMMONIA in the last 168 hours. Coagulation Profile: No results for input(s): INR, PROTIME in the last 168 hours. Cardiac Enzymes: No results for input(s): CKTOTAL, CKMB, CKMBINDEX, TROPONINI in the last 168 hours. BNP (last 3 results) No results for input(s): PROBNP in the last 8760 hours. HbA1C: No results for input(s): HGBA1C in the last 72 hours. CBG: Recent Labs  Lab 12/31/20 0751 12/31/20 1238 12/31/20 1700 12/31/20 2113 01/01/21 0727  GLUCAP 193* 215* 203* 225* 225*   Lipid Profile: No results for input(s): CHOL, HDL, LDLCALC, TRIG, CHOLHDL, LDLDIRECT in the last 72 hours. Thyroid Function Tests: No  results for input(s): TSH, T4TOTAL, FREET4, T3FREE, THYROIDAB in the last 72 hours. Anemia Panel: No results for input(s): VITAMINB12, FOLATE, FERRITIN, TIBC, IRON, RETICCTPCT in the last 72 hours.  Sepsis Labs: No results for input(s): PROCALCITON, LATICACIDVEN in the last 168 hours.  Recent Results (from the past 240 hour(s))  Resp Panel by RT-PCR (Flu A&B, Covid) Nasopharyngeal Swab     Status: None   Collection Time: 12/28/20  9:16 PM   Specimen: Nasopharyngeal Swab; Nasopharyngeal(NP) swabs in vial transport medium  Result Value Ref Range Status   SARS Coronavirus 2 by RT PCR NEGATIVE NEGATIVE Final    Comment: (NOTE) SARS-CoV-2 target nucleic acids are NOT DETECTED.  The SARS-CoV-2 RNA is generally detectable in upper respiratory specimens during the acute phase of infection. The  lowest concentration of SARS-CoV-2 viral copies this assay can detect is 138 copies/mL. A negative result does not preclude SARS-Cov-2 infection and should not be used as the sole basis for treatment or other patient management decisions. A negative result may occur with  improper specimen collection/handling, submission of specimen other than nasopharyngeal swab, presence of viral mutation(s) within the areas targeted by this assay, and inadequate number of viral copies(<138 copies/mL). A negative result must be combined with clinical observations, patient history, and epidemiological information. The expected result is Negative.  Fact Sheet for Patients:  EntrepreneurPulse.com.au  Fact Sheet for Healthcare Providers:  IncredibleEmployment.be  This test is no t yet approved or cleared by the [place] FDA and  has been authorized for detection and/or diagnosis of SARS-CoV-2 by FDA under an Emergency Use Authorization (EUA). This EUA will remain  in effect (meaning this test can be used) for the duration of the COVID-19 declaration under Section 564(b)(1) of the Act, 21 U.S.C.section 360bbb-3(b)(1), unless the authorization is terminated  or revoked sooner.       Influenza A by PCR NEGATIVE NEGATIVE Final   Influenza B by PCR NEGATIVE NEGATIVE Final    Comment: (NOTE) The Xpert Xpress SARS-CoV-2/FLU/RSV plus assay is intended as an aid in the diagnosis of influenza from Nasopharyngeal swab specimens and should not be used as a sole basis for treatment. Nasal washings and aspirates are unacceptable for Xpert Xpress SARS-CoV-2/FLU/RSV testing.  Fact Sheet for Patients: EntrepreneurPulse.com.au  Fact Sheet for Healthcare Providers: IncredibleEmployment.be  This test is not yet approved or cleared by the [place] FDA and has been authorized for detection and/or diagnosis of SARS-CoV-2 by FDA under  an Emergency Use Authorization (EUA). This EUA will remain in effect (meaning this test can be used) for the duration of the COVID-19 declaration under Section 564(b)(1) of the Act, 21 U.S.C. section 360bbb-3(b)(1), unless the authorization is terminated or revoked.  Performed at [facility], [address], [place], [place] [address]          Radiology Studies: No results found.      Scheduled Meds:  amLODipine  10 mg Oral Daily   insulin aspart  0-15 Units Subcutaneous TID WC   insulin glargine-yfgn  15 Units Subcutaneous QHS   metoprolol tartrate  50 mg Oral BID   pantoprazole (PROTONIX) IV  40 mg Intravenous Q12H   ramipril  10 mg Oral BID   torsemide  20 mg Oral Daily   Continuous Infusions:     LOS: 4 days    Time spent: 25 minutes    Sidney Ace, MD [facility]   If 7PM-7AM, please contact night-coverage  01/01/2021, 10:49 AM

## 2021-01-01 NOTE — Progress Notes (Signed)
Patient complains of itchy rash to abdomen. Abdomen inspection reveals small area of raised, wheeled rash. MD made aware. Patient offered benadryl for symptoms, but declines at this time stating he would like to wait until his wife arrives and gives him a shower.

## 2021-01-02 LAB — CBC WITH DIFFERENTIAL/PLATELET
Abs Immature Granulocytes: 0.03 10*3/uL (ref 0.00–0.07)
Basophils Absolute: 0 10*3/uL (ref 0.0–0.1)
Basophils Relative: 1 %
Eosinophils Absolute: 0 10*3/uL (ref 0.0–0.5)
Eosinophils Relative: 0 %
HCT: 22.8 % — ABNORMAL LOW (ref 39.0–52.0)
Hemoglobin: 7.9 g/dL — ABNORMAL LOW (ref 13.0–17.0)
Immature Granulocytes: 1 %
Lymphocytes Relative: 8 %
Lymphs Abs: 0.2 10*3/uL — ABNORMAL LOW (ref 0.7–4.0)
MCH: 32.9 pg (ref 26.0–34.0)
MCHC: 34.6 g/dL (ref 30.0–36.0)
MCV: 95 fL (ref 80.0–100.0)
Monocytes Absolute: 0.4 10*3/uL (ref 0.1–1.0)
Monocytes Relative: 14 %
Neutro Abs: 1.9 10*3/uL (ref 1.7–7.7)
Neutrophils Relative %: 76 %
Platelets: 366 10*3/uL (ref 150–400)
RBC: 2.4 MIL/uL — ABNORMAL LOW (ref 4.22–5.81)
RDW: 18.6 % — ABNORMAL HIGH (ref 11.5–15.5)
WBC: 2.5 10*3/uL — ABNORMAL LOW (ref 4.0–10.5)
nRBC: 0 % (ref 0.0–0.2)

## 2021-01-02 LAB — GLUCOSE, CAPILLARY
Glucose-Capillary: 282 mg/dL — ABNORMAL HIGH (ref 70–99)
Glucose-Capillary: 220 mg/dL — ABNORMAL HIGH (ref 70–99)
Glucose-Capillary: 253 mg/dL — ABNORMAL HIGH (ref 70–99)
Glucose-Capillary: 209 mg/dL — ABNORMAL HIGH (ref 70–99)

## 2021-01-02 MED ORDER — INSULIN GLARGINE-YFGN 100 UNIT/ML ~~LOC~~ SOLN
25.0000 [IU] | Freq: Every day | SUBCUTANEOUS | Status: DC
  Administered 2021-01-02 – 2021-01-03 (×2): 25 [IU] via SUBCUTANEOUS
  Filled 2021-01-02 (×3): qty 0.25

## 2021-01-02 NOTE — Progress Notes (Signed)
PROGRESS NOTE    Patrick Duncan  RFF:[id number] DOB: 1965-03-27 DOA: 12/28/2020 PCP: Sofie Hartigan, MD    Brief Narrative:   55 y.o. male seen in ed with complaints of hemoglobin of 6.2.  Patient coming to [place] from home/ pcp office.  Patient was recently admitted on 12/21/2020 with bright red blood per rectum patient does use Aleve which I have asked him to refrain from using Aleve and any NSAIDs. Patient states that other than weakness he was actually doing well and was not feeling bad in any way.  Patient denies any headaches blurred vision chest pain shortness of breath nausea vomiting.  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   Pt has past medical history of arthritis, cellulitis, depression, diabetes mellitus type 2, hypertension, IgA deficiency, morbid obesity, sleep apnea, hyperlipidemia.  Patient was admitted on 12 -6-22 fro anemia and had transfusion and egd and colonoscopy.   12/14: Hemoglobin dropped to 5.5.  Ordered 2 units transfusion however pending arrival of washed PRBCs from outside facility.  Patient hemodynamically stable.  Discussed with GI.  Recent colonoscopy and EGD.  So we will proceed with video capsule endoscopy today  12/16: Capsule study negative.  Unclear source of blood loss.  Considering unexplained anemia and leukopenia discussed with hematology.  Decision made to proceed with bone marrow biopsy.  Inpatient versus outpatient.  12/17: Patient hemoglobin relatively stable.  Patient remains leukopenic.  Unclear etiology.  Plan to proceed with inpatient bone marrow biopsy on Monday 12/19.   Assessment & Plan:   Principal Problem:   Acute blood loss anemia Active Problems:   Diabetes (HCC)   HTN (hypertension)   OSA (obstructive sleep apnea)   AKI (acute kidney injury) ([place])   GI bleed   Acute on chronic blood loss anemia   Acute on chronic  anemia Possible slow GI bleed Likely GI source however recent colonoscopy and EGD negative for acute bleed Discussed with GI, appreciate consultation Patient needs washed PRBC Transfused 2 units on 12/14 S/p video endoscopy, results negative  Plan: Currently no indication for transfusion.  Monitor and transfuse as necessary hemoglobin less than 7  Pancytopenia Unclear etiology.  Patient with anemia and leukopenia.  Chronicity unclear as well.  Discussed with hematology Plan: BM bx tomorrow NPO after 12  History of IgA deficiency Per patient this was diagnosed after recurrent illnesses as a child Does not see immunology or hematology as outpatient No specific immunodeficiency medications Plan: Hematology recs appreciated Immunoglobulin panel sent and pending If IgA def confirmed, will need referral to tertiary center Defer to outpatient  Essential hypertension Blood pressure reasonably well controlled over interval Likely in the setting of acute blood loss anemia At home patient takes amlodipine, ramipril, Aldactone, torsemide, metoprolol titrate Plan: Continue metoprolol titrate at home dose of 50 mg twice daily Continue Demadex 20 mg daily Continue amlodipine Continue ramipril  Type 2 diabetes mellitus Oral agents on hold Lantus 25U daily Moderate sliding scale Hypoglycemia protocol  Acute kidney injury Avoid nonessential nephrotoxins Monitor kidney function  Morbid obesity BMI 54 This complicates overall care and prognosis Request RD consultation    DVT prophylaxis: SCD Code Status: Full Family Communication: Spouse at bedside 12/14, father at bedside 12/15, spouse at bedside 12/16, spouse via phone [phone] on 12/17 Disposition Plan: Status is: Inpatient  Remains inpatient appropriate because: Pancytopenia of unclear etiology.  Acute on chronic anemia.  Will monitor hemoglobin.  Transfuse as necessary.  Bone marrow biopsy planned Monday 12/19        Level of care: Telemetry Medical  Consultants:  GI Hematology  Procedures:  Video capsule endoscopy 12/14  Antimicrobials: None   Subjective: Seen and examined.  Resting in bed comfortably.  No pain complaints.  Objective: Vitals:   01/01/21 1954 01/02/21 0410 01/02/21 0744 01/02/21 1029  BP: (!) 128/52 (!) 126/52 (!) 127/57 (!) 126/55  Pulse: 82 75 71 78  Resp: _0 Temp: 97.8 F (36.6 C) 98.1 F (36.7 C) 98 F (36.7 C)   TempSrc: Oral Oral Oral   SpO2: 96% 96% 99% 100%  Weight:      Height:        Intake/Output Summary (Last 24 hours) at 01/02/2021 1057 Last data filed at 01/01/2021 1900 Gross per 24 hour  Intake 480 ml  Output --  Net 480 ml    Filed Weights   12/28/20 1826  Weight: (!) 158.3 kg    Examination:  General exam: No acute distress Respiratory system: Lungs clear.  Normal work of breathing.  Room air Cardiovascular system: S1-S2, RRR, no murmurs Gastrointestinal system: Obese, NT/ND, normal bowel sounds Central nervous system: Alert and oriented. No focal neurological deficits. Extremities: Symmetric 5 x 5 power. Skin: Contact dermatitis noted on stomach and back Psychiatry: Judgement and insight appear normal. Mood & affect appropriate.     Data Reviewed: I have personally reviewed following labs and imaging studies  CBC: Recent Labs  Lab 12/28/20 2116 12/28/20 2340 12/29/20 1940 12/30/20 0501 12/31/20 0345 01/01/21 0453 01/02/21 0316  WBC 2.1*   < > 2.2* 1.6* 1.8* 2.1* 2.5*  NEUTROABS 1.7  --   --   --  1.3* 1.6* 1.9  HGB 5.8*   < > 7.3* 7.4* 7.5* 7.8* 7.9*  HCT 17.0*   < > 21.0* 21.9* 22.3* 22.5* 22.8*  MCV 97.1   < > 92.5 95.2 94.9 93.4 95.0  PLT 255   < > 286 294 329 335 366   < > = values in this interval not displayed.   Basic Metabolic Panel: Recent Labs  Lab 12/28/20 2116 12/29/20 0442 12/30/20 0501  NA 132* 134* 137  K 4.5 4.1 4.1  CL 103 105 106  CO2 _1 GLUCOSE 132* 110* 125*  BUN 92* 83*  48*  CREATININE 2.06* 1.55* 1.19  CALCIUM 8.8* 8.7* 9.2   GFR: Estimated Creatinine Clearance: 104.6 mL/min (by C-G formula based on SCr of 1.19 mg/dL). Liver Function Tests: Recent Labs  Lab 12/28/20 2116 12/29/20 0442  AST 20 21  ALT 17 17  ALKPHOS 35* 33*  BILITOT 1.2 1.2  PROT 6.8 6.4*  ALBUMIN 3.5 3.4*   No results for input(s): LIPASE, AMYLASE in the last 168 hours. No results for input(s): AMMONIA in the last 168 hours. Coagulation Profile: No results for input(s): INR, PROTIME in the last 168 hours. Cardiac Enzymes: No results for input(s): CKTOTAL, CKMB, CKMBINDEX, TROPONINI in the last 168 hours. BNP (last 3 results) No results for input(s): PROBNP in the last 8760 hours. HbA1C: No results for input(s): HGBA1C in the last 72 hours. CBG: Recent Labs  Lab 01/01/21 0727 01/01/21 1149 01/01/21 1548 01/01/21 2144 01/02/21 0946  GLUCAP 225* 271* 256* 291* 282*   Lipid Profile: No results for input(s): CHOL, HDL, LDLCALC, TRIG, CHOLHDL, LDLDIRECT in the last 72 hours. Thyroid Function Tests: No results for input(s): TSH, T4TOTAL, FREET4, T3FREE, THYROIDAB in  the last 72 hours. Anemia Panel: No results for input(s): VITAMINB12, FOLATE, FERRITIN, TIBC, IRON, RETICCTPCT in the last 72 hours.  Sepsis Labs: No results for input(s): PROCALCITON, LATICACIDVEN in the last 168 hours.  Recent Results (from the past 240 hour(s))  Resp Panel by RT-PCR (Flu A&B, Covid) Nasopharyngeal Swab     Status: None   Collection Time: 12/28/20  9:16 PM   Specimen: Nasopharyngeal Swab; Nasopharyngeal(NP) swabs in vial transport medium  Result Value Ref Range Status   SARS Coronavirus 2 by RT PCR NEGATIVE NEGATIVE Final    Comment: (NOTE) SARS-CoV-2 target nucleic acids are NOT DETECTED.  The SARS-CoV-2 RNA is generally detectable in upper respiratory specimens during the acute phase of infection. The lowest concentration of SARS-CoV-2 viral copies this assay can detect is 138  copies/mL. A negative result does not preclude SARS-Cov-2 infection and should not be used as the sole basis for treatment or other patient management decisions. A negative result may occur with  improper specimen collection/handling, submission of specimen other than nasopharyngeal swab, presence of viral mutation(s) within the areas targeted by this assay, and inadequate number of viral copies(<138 copies/mL). A negative result must be combined with clinical observations, patient history, and epidemiological information. The expected result is Negative.  Fact Sheet for Patients:  EntrepreneurPulse.com.au  Fact Sheet for Healthcare Providers:  IncredibleEmployment.be  This test is no t yet approved or cleared by the [place] FDA and  has been authorized for detection and/or diagnosis of SARS-CoV-2 by FDA under an Emergency Use Authorization (EUA). This EUA will remain  in effect (meaning this test can be used) for the duration of the COVID-19 declaration under Section 564(b)(1) of the Act, 21 U.S.C.section 360bbb-3(b)(1), unless the authorization is terminated  or revoked sooner.       Influenza A by PCR NEGATIVE NEGATIVE Final   Influenza B by PCR NEGATIVE NEGATIVE Final    Comment: (NOTE) The Xpert Xpress SARS-CoV-2/FLU/RSV plus assay is intended as an aid in the diagnosis of influenza from Nasopharyngeal swab specimens and should not be used as a sole basis for treatment. Nasal washings and aspirates are unacceptable for Xpert Xpress SARS-CoV-2/FLU/RSV testing.  Fact Sheet for Patients: EntrepreneurPulse.com.au  Fact Sheet for Healthcare Providers: IncredibleEmployment.be  This test is not yet approved or cleared by the [place] FDA and has been authorized for detection and/or diagnosis of SARS-CoV-2 by FDA under an Emergency Use Authorization (EUA). This EUA will remain in effect (meaning  this test can be used) for the duration of the COVID-19 declaration under Section 564(b)(1) of the Act, 21 U.S.C. section 360bbb-3(b)(1), unless the authorization is terminated or revoked.  Performed at [facility], [address], [place], [place] [address]          Radiology Studies: No results found.      Scheduled Meds:  amLODipine  10 mg Oral Daily   insulin aspart  0-15 Units Subcutaneous TID WC   insulin glargine-yfgn  20 Units Subcutaneous QHS   metoprolol tartrate  50 mg Oral BID   pantoprazole (PROTONIX) IV  40 mg Intravenous Q12H   ramipril  10 mg Oral BID   torsemide  20 mg Oral Daily   Continuous Infusions:     LOS: 5 days    Time spent: 25 minutes    Sidney Ace, MD [facility]   If 7PM-7AM, please contact night-coverage  01/02/2021, 10:57 AM

## 2021-01-02 NOTE — Progress Notes (Signed)
[place]  [phone] [phone]  ID: Dorathy Kinsman OB: 14-Oct-1965  MR#: [id number]  JGO#:[id number]  Patient Care Team: Sofie Hartigan, MD as PCP - General (Family Medicine)  CHIEF COMPLAINT: Anemia/leukopenia  INTERVAL HISTORY: Mr. Gamero is a 55 year old male with past medical history of arthritis, cellulitis, depression, diabetes type 2, hypertension, IgA deficiency, morbid obesity, sleep apnea and hyperlipidemia who was recently admitted to [facility] for anemia and hemoglobin of 6.2.  He was sent from his PCPs office to the emergency room.  He was recently admitted to the hospital on 12/21/2020 for bright red blood per rectum.  Admitted to use of Aleve and other NSAIDs.  Has required irradiated PRBC due to his IgA deficiency and developing blood transfusion reactions during his previous admission.  Reports as a child having extensive work-up for frequent sinus infections.  His pediatrician sent him to [facility] and he was diagnosed with IgA deficiency.  He followed for several years but eventually stopped.  States now he follows with his PCP.  Reports frequent sinus infections but overall is feels healthy.   He received 1 unit PRBC with a drop in his hemoglobin intially but now his counts have improved and are stable around 7.8.  Reticulocyte panel showed absolute retic count of 45.9.    GI was consulted.  He had a colonoscopy/EGD with previous admission which showed nonbleeding internal hemorrhoids, normal esophagus, normal stomach and no specimens were collected. Video Capsule study was completed during this admission and was negative.   Presently he continues to do well. Scheduled for BMB in the morning.   REVIEW OF SYSTEMS:   Review of Systems  Constitutional:  Positive for malaise/fatigue. Negative for chills, fever and weight loss.  HENT:  Negative for congestion, ear pain and tinnitus.   Eyes: Negative.  Negative for blurred vision and  double vision.  Respiratory: Negative.  Negative for cough, sputum production and shortness of breath.   Cardiovascular: Negative.  Negative for chest pain, palpitations and leg swelling.  Gastrointestinal: Negative.  Negative for abdominal pain, constipation, diarrhea, nausea and vomiting.  Genitourinary:  Negative for dysuria, frequency and urgency.  Musculoskeletal:  Negative for back pain and falls.  Skin: Negative.  Negative for rash.  Neurological: Negative.  Negative for weakness and headaches.  Endo/Heme/Allergies: Negative.  Does not bruise/bleed easily.  Psychiatric/Behavioral: Negative.  Negative for depression. The patient is not nervous/anxious and does not have insomnia.    As per HPI. Otherwise, a complete review of systems is negative.  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[place])    Morbid obesity ([place])    Sleep apnea     PAST SURGICAL HISTORY: Past Surgical History:  Procedure Laterality Date   COLONOSCOPY WITH PROPOFOL N/A 07/13/2020   Procedure: COLONOSCOPY WITH PROPOFOL;  Surgeon: Lesly Rubenstein, MD;  Location: [facility] ENDOSCOPY;  Service: Endoscopy;  Laterality: N/A;   COLONOSCOPY WITH PROPOFOL N/A 12/23/2020   Procedure: COLONOSCOPY WITH PROPOFOL;  Surgeon: Lucilla Lame, MD;  Location: [facility] ENDOSCOPY;  Service: Endoscopy;  Laterality: N/A;   ESOPHAGOGASTRODUODENOSCOPY (EGD) WITH PROPOFOL N/A 12/23/2020   Procedure: ESOPHAGOGASTRODUODENOSCOPY (EGD) WITH PROPOFOL;  Surgeon: Lucilla Lame, MD;  Location: [facility]  ENDOSCOPY;  Service: Endoscopy;  Laterality: N/A;   GIVENS CAPSULE STUDY N/A 12/29/2020   Procedure: GIVENS CAPSULE STUDY;  Surgeon: Lin Landsman, MD;  Location: [facility] ENDOSCOPY;  Service: Gastroenterology;  Laterality: N/A;    TOOTH EXTRACTION      FAMILY HISTORY: Family History  Problem Relation Age of Onset   Benign prostatic hyperplasia Father    Psoriasis Father    Hypertension Mother    Hyperlipidemia Mother    Diabetes Maternal Grandmother    Diabetes Paternal Grandmother     ADVANCED DIRECTIVES (Y/N):  _0 @  HEALTH MAINTENANCE: Social History   Tobacco Use   Smoking status: Never   Smokeless tobacco: Never  Vaping Use   Vaping Use: Never used  Substance Use Topics   Alcohol use: No   Drug use: No     Colonoscopy:  PAP:  Bone density:  Lipid panel:  Allergies  Allergen Reactions   Ampicillin Diarrhea   Bactrim [Sulfamethoxazole-Trimethoprim] Hives   Cephalexin    Claritin [Loratadine] Other (See Comments)    Irritates throat   Penicillins Rash    Current Facility-Administered Medications  Medication Dose Route Frequency Provider Last Rate Last Admin   acetaminophen (TYLENOL) tablet 650 mg  650 mg Oral Q6H PRN Para Skeans, MD       Or   acetaminophen (TYLENOL) suppository 650 mg  650 mg Rectal Q6H PRN Para Skeans, MD       amLODipine (NORVASC) tablet 10 mg  10 mg Oral Daily Priscella Mann, Sudheer B, MD   10 mg at 01/02/21 1033   diphenhydrAMINE (BENADRYL) injection 25 mg  25 mg Intravenous Q6H PRN Ralene Muskrat B, MD   25 mg at 01/01/21 2156   hydrocortisone cream 1 %   Topical BID PRN Ralene Muskrat B, MD   Given at 01/02/21 1034   insulin aspart (novoLOG) injection 0-15 Units  0-15 Units Subcutaneous TID WC Florina Ou V, MD   5 Units at 01/02/21 1335   insulin glargine-yfgn (SEMGLEE) injection 25 Units  25 Units Subcutaneous QHS Sreenath, Sudheer B, MD       metoprolol tartrate (LOPRESSOR) tablet 50 mg  50 mg Oral BID Priscella Mann, Sudheer B, MD   50 mg at 01/02/21 1033   morphine 2 MG/ML injection 2 mg  2 mg Intravenous Q2H PRN Para Skeans, MD       ondansetron (ZOFRAN) tablet 4 mg  4 mg Oral Q6H PRN Para Skeans, MD       Or   ondansetron (ZOFRAN) injection 4 mg   4 mg Intravenous Q6H PRN Para Skeans, MD       pantoprazole (PROTONIX) injection 40 mg  40 mg Intravenous Q12H Sreenath, Sudheer B, MD   40 mg at 01/02/21 1028   ramipril (ALTACE) capsule 10 mg  10 mg Oral BID Ralene Muskrat B, MD   10 mg at 01/02/21 1032   torsemide (DEMADEX) tablet 20 mg  20 mg Oral Daily Priscella Mann, Sudheer B, MD   20 mg at 01/02/21 1033    OBJECTIVE: Vitals:   01/02/21 1029 01/02/21 1219  BP: (!) 126/55 (!) 125/56  Pulse: 78 75  Resp:  20  Temp:  97.8 F (36.6 C)  SpO2: 100% 100%     Body mass index is 54.66 kg/m.    ECOG FS:1 - Symptomatic but completely ambulatory  Physical Exam Constitutional:      Appearance: Normal appearance. He is obese.  HENT:  Head: Normocephalic and atraumatic.  Eyes:     Pupils: Pupils are equal, round, and reactive to light.  Cardiovascular:     Rate and Rhythm: Normal rate and regular rhythm.     Heart sounds: Normal heart sounds. No murmur heard. Pulmonary:     Effort: Pulmonary effort is normal.     Breath sounds: Normal breath sounds. No wheezing.  Abdominal:     General: Bowel sounds are normal. There is no distension.     Palpations: Abdomen is soft.     Tenderness: There is no abdominal tenderness.  Musculoskeletal:        General: Normal range of motion.     Cervical back: Normal range of motion.  Skin:    General: Skin is warm and dry.     Findings: No rash.  Neurological:     Mental Status: He is alert and oriented to person, place, and time.  Psychiatric:        Judgment: Judgment normal.     LAB RESULTS:  Lab Results  Component Value Date   NA 137 12/30/2020   K 4.1 12/30/2020   CL 106 12/30/2020   CO2 24 12/30/2020   GLUCOSE 125 (H) 12/30/2020   BUN 48 (H) 12/30/2020   CREATININE 1.19 12/30/2020   CALCIUM 9.2 12/30/2020   PROT 6.4 (L) 12/29/2020   ALBUMIN 3.4 (L) 12/29/2020   AST 21 12/29/2020   ALT 17 12/29/2020   ALKPHOS 33 (L) 12/29/2020   BILITOT 1.2 12/29/2020   GFRNONAA >60  12/30/2020   GFRAA >60 10/25/2016    Lab Results  Component Value Date   WBC 2.5 (L) 01/02/2021   NEUTROABS 1.9 01/02/2021   HGB 7.9 (L) 01/02/2021   HCT 22.8 (L) 01/02/2021   MCV 95.0 01/02/2021   PLT 366 01/02/2021     STUDIES: DG Chest 2 View  Result Date: 12/14/2020 CLINICAL DATA:  Short of breath and hyperglycemia EXAM: CHEST - 2 VIEW COMPARISON:  None. FINDINGS: Normal mediastinum and cardiac silhouette. Normal pulmonary vasculature. No evidence of effusion, infiltrate, or pneumothorax. No acute bony abnormality. IMPRESSION: No acute cardiopulmonary process. Electronically Signed   By: Suzy Bouchard M.D.   On: 12/14/2020 12:20   US Venous Img Upper Uni Right(DVT)  Result Date: 12/28/2020 CLINICAL DATA:  Right arm swelling EXAM: RIGHT UPPER EXTREMITY VENOUS DOPPLER ULTRASOUND TECHNIQUE: Gray-scale sonography with graded compression, as well as color Doppler and duplex ultrasound were performed to evaluate the upper extremity deep venous system from the level of the subclavian vein and including the jugular, axillary, basilic, radial, ulnar and upper cephalic vein. Spectral Doppler was utilized to evaluate flow at rest and with distal augmentation maneuvers. COMPARISON:  None. FINDINGS: Contralateral Subclavian Vein: Respiratory phasicity is normal and symmetric with the symptomatic side. No evidence of thrombus. Normal compressibility. Internal Jugular Vein: No evidence of thrombus. Normal compressibility, respiratory phasicity and response to augmentation. Subclavian Vein: No evidence of thrombus. Normal compressibility, respiratory phasicity and response to augmentation. Axillary Vein: No evidence of thrombus. Normal compressibility, respiratory phasicity and response to augmentation. Cephalic Vein: Occlusive thrombus seen in the left cephalic vein from the shoulder to the antecubital region. Basilic Vein: No evidence of thrombus. Normal compressibility, respiratory phasicity and  response to augmentation. Brachial Veins: No evidence of thrombus. Normal compressibility, respiratory phasicity and response to augmentation. Radial Veins: No evidence of thrombus. Normal compressibility, respiratory phasicity and response to augmentation. Ulnar Veins: No evidence of thrombus. Normal compressibility, respiratory phasicity and response  to augmentation. Venous Reflux:  None visualized. Other Findings:  None visualized. IMPRESSION: Occlusive thrombus in the right upper arm cephalic vein from the shoulder to the elbow. Electronically Signed   By: Rolm Baptise M.D.   On: 12/28/2020 22:44   ECHOCARDIOGRAM COMPLETE  Result Date: 12/23/2020    ECHOCARDIOGRAM REPORT   Patient Name:   JANMICHAEL GIRAUD Date of Exam: 12/23/2020 Medical Rec #:  [id number]         Height:       67.0 in Accession #:    [id number]        Weight:       349.0 lb Date of Birth:  04/20/1965        BSA:          2.562 m Patient Age:    54 years          BP:           129/44 mmHg Patient Gender: M                 HR:           82 bpm. Exam Location:  [facility] Procedure: 2D Echo, Cardiac Doppler and Color Doppler Indications:     Pericardial Effusion I31.3  History:         Patient has no prior history of Echocardiogram examinations.                  Risk Factors:Diabetes, Hypertension, Dyslipidemia and Sleep                  Apnea.  Sonographer:     Sherrie Sport Referring Phys:  [id number] [facility] AMIN Diagnosing Phys: Ida Rogue MD  Sonographer Comments: Suboptimal apical window and no subcostal window. IMPRESSIONS  1. Left ventricular ejection fraction, by estimation, is 60 to 65%. The left ventricle has normal function. The left ventricle has no regional wall motion abnormalities. There is moderate left ventricular hypertrophy. Left ventricular diastolic parameters are consistent with Grade II diastolic dysfunction (pseudonormalization).  2. Right ventricular systolic function is normal. The right ventricular size is normal. There is  normal pulmonary artery systolic pressure. The estimated right ventricular systolic pressure is 07.6 mmHg.  3. Left atrial size was mildly dilated.  4. A small to moderate noncircumferential pericardial effusion is present.No tamponade.  5. The mitral valve is normal in structure. No evidence of mitral valve regurgitation. No evidence of mitral stenosis.  6. The aortic valve was not well visualized. Aortic valve regurgitation is not visualized. No aortic stenosis is present.  7. The inferior vena cava is normal in size with greater than 50% respiratory variability, suggesting right atrial pressure of 3 mmHg. FINDINGS  Left Ventricle: Left ventricular ejection fraction, by estimation, is 60 to 65%. The left ventricle has normal function. The left ventricle has no regional wall motion abnormalities. The left ventricular internal cavity size was normal in size. There is  moderate left ventricular hypertrophy. Left ventricular diastolic parameters are consistent with Grade II diastolic dysfunction (pseudonormalization). Right Ventricle: The right ventricular size is normal. No increase in right ventricular wall thickness. Right ventricular systolic function is normal. There is normal pulmonary artery systolic pressure. The tricuspid regurgitant velocity is 1.59 m/s, and  with an assumed right atrial pressure of 5 mmHg, the estimated right ventricular systolic pressure is 22.6 mmHg. Left Atrium: Left atrial size was mildly dilated. Right Atrium: Right atrial size was normal in size. Pericardium: A small pericardial effusion  is present. There is no evidence of cardiac tamponade. Mitral Valve: The mitral valve is normal in structure. No evidence of mitral valve regurgitation. No evidence of mitral valve stenosis. MV peak gradient, 5.7 mmHg. The mean mitral valve gradient is 3.0 mmHg. Tricuspid Valve: The tricuspid valve is normal in structure. Tricuspid valve regurgitation is mild . No evidence of tricuspid stenosis.  Aortic Valve: The aortic valve was not well visualized. Aortic valve regurgitation is not visualized. No aortic stenosis is present. Aortic valve mean gradient measures 4.0 mmHg. Aortic valve peak gradient measures 7.4 mmHg. Aortic valve area, by VTI measures 4.99 cm. Pulmonic Valve: The pulmonic valve was normal in structure. Pulmonic valve regurgitation is not visualized. No evidence of pulmonic stenosis. Aorta: The aortic root is normal in size and structure. Venous: The inferior vena cava is normal in size with greater than 50% respiratory variability, suggesting right atrial pressure of 3 mmHg. IAS/Shunts: No atrial level shunt detected by color flow Doppler.  LEFT VENTRICLE PLAX 2D LVIDd:         5.70 cm   Diastology LVIDs:         3.50 cm   LV e' medial:    6.64 cm/s LV PW:         1.40 cm   LV E/e' medial:  18.4 LV IVS:        1.20 cm   LV e' lateral:   10.30 cm/s LVOT diam:     2.20 cm   LV E/e' lateral: 11.8 LV SV:         111 LV SV Index:   43 LVOT Area:     3.80 cm  RIGHT VENTRICLE RV Basal diam:  5.00 cm RV S prime:     14.90 cm/s TAPSE (M-mode): 5.2 cm LEFT ATRIUM              Index        RIGHT ATRIUM           Index LA diam:        4.70 cm  1.83 cm/m   RA Area:     46.30 cm LA Vol (A2C):   126.0 ml 49.18 ml/m  RA Volume:   221.00 ml 86.26 ml/m LA Vol (A4C):   105.0 ml 40.98 ml/m LA Biplane Vol: 118.0 ml 46.06 ml/m  AORTIC VALVE                    PULMONIC VALVE AV Area (Vmax):    4.64 cm     PV Vmax:        1.06 m/s AV Area (Vmean):   3.99 cm     PV Vmean:       82.200 cm/s AV Area (VTI):     4.99 cm     PV VTI:         0.204 m AV Vmax:           136.00 cm/s  PV Peak grad:   4.5 mmHg AV Vmean:          92.900 cm/s  PV Mean grad:   3.0 mmHg AV VTI:            0.222 m      RVOT Peak grad: 7 mmHg AV Peak Grad:      7.4 mmHg AV Mean Grad:      4.0 mmHg LVOT Vmax:         166.00 cm/s LVOT Vmean:  97.500 cm/s LVOT VTI:          0.291 m LVOT/AV VTI ratio: 1.31  AORTA Ao Root diam: 3.33 cm  MITRAL VALVE                TRICUSPID VALVE MV Area (PHT): 3.33 cm     TR Peak grad:   10.1 mmHg MV Area VTI:   3.76 cm     TR Vmax:        159.00 cm/s MV Peak grad:  5.7 mmHg MV Mean grad:  3.0 mmHg     SHUNTS MV Vmax:       1.19 m/s     Systemic VTI:  0.29 m MV Vmean:      82.0 cm/s    Systemic Diam: 2.20 cm MV Decel Time: 228 msec     Pulmonic VTI:  0.260 m MV E velocity: 122.00 cm/s MV A velocity: 121.00 cm/s MV E/A ratio:  1.01 Ida Rogue MD Electronically signed by Ida Rogue MD Signature Date/Time: 12/23/2020/10:15:34 AM    Final     ASSESSMENT: Mr. Keady is a 55 year old male who is here for rectal bleeding and symptomatic anemia.  Has history of IgA deficiency as a child.   Discussed case with Dr. Grayland Ormond who recommended repeat immunoglobulins given IgA deficiency is rare.  IgA results are less then 5 which is low. Video capsule study was negative. Hemoglobin and white count continue to slowly improve.   PLAN: Given no acute GI bleed and anemia and neutropenia would recommend bone marrow biopsy.  Discussed with patient who is agreeable.  This is scheduled for 01/03/2021. Due to slow response of his counts, he will remain  inpatient until BMB. He will then follow-up with Dr. Grayland Ormond around 12/28 for results and plan moving forward.  I spent 20 minutes dedicated to the care of this patient (face-to-face and non-face-to-face) on the date of the encounter to include what is described in the assessment and plan.  Patient expressed understanding and was in agreement with this plan. He also understands that He can call clinic at any time with any questions, concerns, or complaints.    Cancer Staging  No matching staging information was found for the patient.  Jacquelin Hawking, NP   01/02/2021 4:40 PM

## 2021-01-03 ENCOUNTER — Inpatient Hospital Stay: Payer: Medicare HMO

## 2021-01-03 ENCOUNTER — Encounter: Payer: Self-pay | Admitting: Internal Medicine

## 2021-01-03 LAB — CBC WITH DIFFERENTIAL/PLATELET
Abs Immature Granulocytes: 0.07 10*3/uL (ref 0.00–0.07)
Basophils Absolute: 0 10*3/uL (ref 0.0–0.1)
Basophils Relative: 0 %
Eosinophils Absolute: 0 10*3/uL (ref 0.0–0.5)
Eosinophils Relative: 0 %
HCT: 20.7 % — ABNORMAL LOW (ref 39.0–52.0)
Hemoglobin: 7.2 g/dL — ABNORMAL LOW (ref 13.0–17.0)
Immature Granulocytes: 2 %
Lymphocytes Relative: 8 %
Lymphs Abs: 0.2 10*3/uL — ABNORMAL LOW (ref 0.7–4.0)
MCH: 32.6 pg (ref 26.0–34.0)
MCHC: 34.8 g/dL (ref 30.0–36.0)
MCV: 93.7 fL (ref 80.0–100.0)
Monocytes Absolute: 0.4 10*3/uL (ref 0.1–1.0)
Monocytes Relative: 12 %
Neutro Abs: 2.3 10*3/uL (ref 1.7–7.7)
Neutrophils Relative %: 78 %
Platelets: 353 10*3/uL (ref 150–400)
RBC: 2.21 MIL/uL — ABNORMAL LOW (ref 4.22–5.81)
RDW: 18.6 % — ABNORMAL HIGH (ref 11.5–15.5)
WBC: 3 10*3/uL — ABNORMAL LOW (ref 4.0–10.5)
nRBC: 0 % (ref 0.0–0.2)

## 2021-01-03 LAB — GLUCOSE, CAPILLARY
Glucose-Capillary: 246 mg/dL — ABNORMAL HIGH (ref 70–99)
Glucose-Capillary: 209 mg/dL — ABNORMAL HIGH (ref 70–99)
Glucose-Capillary: 219 mg/dL — ABNORMAL HIGH (ref 70–99)
Glucose-Capillary: 255 mg/dL — ABNORMAL HIGH (ref 70–99)

## 2021-01-03 LAB — IMMUNOGLOBULINS A/E/G/M, SERUM
IgA: <5 mg/dL — ABNORMAL LOW (ref 90–386)
IgE (Immunoglobulin E), Serum: <2 IU/mL — ABNORMAL LOW (ref 6–495)
IgG (Immunoglobin G), Serum: 1320 mg/dL (ref 603–1613)
IgM (Immunoglobulin M), Srm: 116 mg/dL (ref 20–172)

## 2021-01-03 MED ORDER — FENTANYL CITRATE (PF) 100 MCG/2ML IJ SOLN
INTRAMUSCULAR | Status: AC
  Filled 2021-01-03: qty 2

## 2021-01-03 MED ORDER — DIPHENHYDRAMINE HCL 25 MG PO CAPS
25.0000 mg | ORAL_CAPSULE | Freq: Four times a day (QID) | ORAL | Status: DC | PRN
  Administered 2021-01-04: 25 mg via ORAL
  Filled 2021-01-03: qty 1

## 2021-01-03 MED ORDER — HEPARIN SOD (PORK) LOCK FLUSH 100 UNIT/ML IV SOLN
INTRAVENOUS | Status: AC
  Filled 2021-01-03: qty 5

## 2021-01-03 MED ORDER — FENTANYL CITRATE (PF) 100 MCG/2ML IJ SOLN
INTRAMUSCULAR | Status: AC | PRN
  Administered 2021-01-03: 50 ug via INTRAVENOUS

## 2021-01-03 MED ORDER — LORAZEPAM 2 MG/ML IJ SOLN
INTRAMUSCULAR | Status: AC | PRN
  Administered 2021-01-03: .5 mg via INTRAVENOUS

## 2021-01-03 MED ORDER — SODIUM CHLORIDE 0.9% IV SOLUTION: Freq: Once | INTRAVENOUS | Status: AC

## 2021-01-03 MED ORDER — MIDAZOLAM HCL 2 MG/2ML IJ SOLN
INTRAMUSCULAR | Status: AC
  Filled 2021-01-03: qty 4

## 2021-01-03 NOTE — Plan of Care (Signed)

## 2021-01-03 NOTE — Progress Notes (Signed)
Transported via stretcher to CT for procedure. No complaints or signs of acute distress noted.

## 2021-01-03 NOTE — Progress Notes (Signed)
Patient has returned via stretcher to unit from CT. Will continue to monitor.

## 2021-01-03 NOTE — Care Management Important Message (Signed)
Important Message  Patient Details  Name: Patrick Duncan MRN: [id number] Date of Birth: 04-01-1965   Medicare Important Message Given:  Yes     Juliann Pulse A Molly Maselli 01/03/2021, 2:17 PM

## 2021-01-03 NOTE — TOC Initial Note (Signed)
Transition of Care [facility]) - Initial/Assessment Note    Patient Details  Name: Patrick Duncan MRN: [id number] Date of Birth: [birth year]  Transition of Care [facility] CM/SW Contact:    Pete Pelt, RN Phone Number: 01/03/2021, 4:41 PM  Clinical Narrative:     Patient lives at home with spouse.  Although she works he states he feels safe at home.  He is just concerned as to whether his blood levels will fall and what to do.  RNCM spoke to him with information and care team aware, MD has spoken with them and is setting patient up in an outpatient basis to be monitored.  RNCM spoke about healthy meals, crock pot, recipes online, cooking ahead.  Patient states his insurance assists with some meals as does his mother.  Patient's wife inquired about FMLA to be able to transport patient to appointments and time off work. Discussed reading employment handbook and checking with her HR about policies, coordinating appointments with best days and hours at work as much as possible,planning as many appointments as far ahead as possible,  taking half days rather than full days.  Spouse states she will inquire about these with her employer.             Patient has cane and walker at home, states he can ambulate to bathroom.  States he feels more comfortable about returning home with this information  Expected Discharge Plan: Home/Self Care Barriers to Discharge: Continued Medical Work up   Patient Goals and CMS Choice        Expected Discharge Plan and Services Expected Discharge Plan: Home/Self Care   Discharge Planning Services: CM Consult   Living arrangements for the past 2 months: Single Family Home                                      Prior Living Arrangements/Services Living arrangements for the past 2 months: Single Family Home Lives with:: Self, Spouse Patient language and need for interpreter reviewed:: Yes (no interpreter required) Do you feel safe going back to the  place where you live?: Yes      Need for Family Participation in Patient Care: Yes (Comment) Care giver support system in place?: Yes (comment)   Criminal Activity/Legal Involvement Pertinent to Current Situation/Hospitalization: No - Comment as needed  Activities of Daily Living Home Assistive Devices/Equipment: Cane (specify quad or straight), CBG Meter, Eyeglasses ADL Screening (condition at time of admission) Patient's cognitive ability adequate to safely complete daily activities?: Yes Is the patient deaf or have difficulty hearing?: No Does the patient have difficulty seeing, even when wearing glasses/contacts?: No Does the patient have difficulty concentrating, remembering, or making decisions?: No Patient able to express need for assistance with ADLs?: Yes Does the patient have difficulty dressing or bathing?: No Independently performs ADLs?: Yes (appropriate for developmental age) Does the patient have difficulty walking or climbing stairs?: Yes Weakness of Legs: Both Weakness of Arms/Hands: Both  Permission Sought/Granted Permission sought to share information with : Case Manager                Emotional Assessment Appearance:: Appears stated age Attitude/Demeanor/Rapport: Gracious, Engaged Affect (typically observed): Anxious Orientation: : Oriented to Situation, Oriented to  Time, Oriented to Place, Oriented to Self Alcohol / Substance Use: Not Applicable Psych Involvement: No (comment)  Admission diagnosis:  Acute blood loss anemia [D62] Arm swelling [M79.89] Anemia,  unspecified type [D64.9] Acute on chronic blood loss anemia [D62] Patient Active Problem List   Diagnosis Date Noted   Acute on chronic blood loss anemia 12/29/2020   Acute blood loss anemia 12/28/2020   Symptomatic anemia    GI bleed 12/21/2020   Cellulitis 10/22/2016   Diabetes ([place]) 10/22/2016   HTN (hypertension) 10/22/2016   HLD (hyperlipidemia) 10/22/2016   OSA (obstructive sleep  apnea) 10/22/2016   AKI (acute kidney injury) ([place]) 10/22/2016   PCP:  Sofie Hartigan, MD Pharmacy:   [facility], [address] [place] [address] Phone: [phone] Fax: [phone]     Social Determinants of Health (SDOH) Interventions    Readmission Risk Interventions No flowsheet data found.

## 2021-01-03 NOTE — Consult Note (Signed)
Chief Complaint: Patient was seen in consultation today for anemia and neutropenia at the request of Dr. Grayland Ormond  Referring Physician(s): Dr. Grayland Ormond   Supervising Physician: Juliet Rude  Patient Status: [place] - In-pt  History of Present Illness: Patrick Duncan is a 55 y.o. male with PMHx significant for IgA deficiency who was admitted for anemia and has been seen by GI with egd/colonoscopy recently performed 12/8 with no active bleeding seen. Patient has been seen by Hematology for neutropenia and anemia and request received for image guided bone marrow biopsy with moderate sedation.  The patient denies any current chest pain or shortness of breath. The patient does have sleep apnea and uses a CPAP. He has no known complications to sedation.    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[place])    Morbid obesity ([place])    Sleep apnea     Past Surgical History:  Procedure Laterality Date   COLONOSCOPY WITH PROPOFOL N/A 07/13/2020   Procedure: COLONOSCOPY WITH PROPOFOL;  Surgeon: Lesly Rubenstein, MD;  Location: [facility] ENDOSCOPY;  Service: Endoscopy;  Laterality: N/A;   COLONOSCOPY WITH PROPOFOL N/A 12/23/2020   Procedure: COLONOSCOPY WITH PROPOFOL;  Surgeon: Lucilla Lame, MD;  Location: [facility] ENDOSCOPY;  Service: Endoscopy;  Laterality: N/A;   ESOPHAGOGASTRODUODENOSCOPY (EGD) WITH PROPOFOL N/A 12/23/2020   Procedure: ESOPHAGOGASTRODUODENOSCOPY (EGD) WITH PROPOFOL;  Surgeon: Lucilla Lame, MD;  Location: [facility] ENDOSCOPY;  Service: Endoscopy;  Laterality: N/A;   GIVENS CAPSULE STUDY N/A 12/29/2020   Procedure: GIVENS CAPSULE STUDY;  Surgeon: Lin Landsman, MD;  Location: [facility] ENDOSCOPY;  Service: Gastroenterology;  Laterality: N/A;   TOOTH EXTRACTION       Allergies: Ampicillin, Bactrim [sulfamethoxazole-trimethoprim], Cephalexin, Claritin [loratadine], and Penicillins  Medications: Prior to Admission medications   Medication Sig Start Date End Date Taking? Authorizing Provider  acarbose (PRECOSE) 25 MG tablet Take 25 mg by mouth 3 (three) times daily with meals.   Yes [provider]  amLODipine (NORVASC) 10 MG tablet Take 10 mg by mouth daily. 05/18/16  Yes [provider]  dorzolamide-timolol (COSOPT) 22.3-6.8 MG/ML ophthalmic solution Place 1 drop into the right eye 2 (two) times daily.   Yes [provider]  FARXIGA 10 MG TABS tablet Take 10 mg by mouth daily. 12/13/20  Yes [provider]  fenofibrate 160 MG tablet Take 160 mg by mouth daily. 07/25/17  Yes [provider]  ferrous sulfate 325 (65 FE) MG EC tablet Take 1 tablet (325 mg total) by mouth 2 (two) times daily. 12/23/20 12/23/21 Yes [name], MD  fexofenadine (ALLEGRA) 180 MG tablet Take 180 mg by mouth daily.   Yes [provider]  fluticasone (FLONASE) 50 MCG/ACT nasal spray Place 2 sprays into both nostrils daily as needed. 05/04/16  Yes [provider]  glimepiride (AMARYL) 4 MG tablet Take 4 mg by mouth daily with breakfast. 12/24/20  Yes [provider]  HIBICLENS 4 % external liquid APPLY TOPICALLY DAILY AS NEEDED. TO [place] AREA 12/06/20  Yes Ralene Bathe, MD  Hydrocortisone-Aloe 1 % CREA Apply 1 application topically 2 (two) times daily as needed (itching). To itchy area on legs   Yes [provider]  ketoconazole (  NIZORAL) 2 % cream Apply to rash in groin once a day as needed for flares. 09/11/19  Yes Ralene Bathe, MD  lidocaine (LMX) 4 % cream Apply 1 application topically as needed.   Yes [provider]  lovastatin (MEVACOR) 10 MG tablet Take 10 mg by mouth daily. 08/12/19  Yes [provider]  metFORMIN (GLUCOPHAGE-XR) 500 MG 24 hr tablet Take  500-1,000 mg by mouth 2 (two) times daily. Takes 1 tablet with breakfast and 2 tablets with supper 07/31/16  Yes [provider]  metoprolol tartrate (LOPRESSOR) 50 MG tablet Take 50 mg by mouth 2 (two) times daily. 08/23/16  Yes [provider]  mupirocin ointment (BACTROBAN) 2 % Apply to boils and ulcers QD PRN flares. 12/21/20  Yes Ralene Bathe, MD  omeprazole (PRILOSEC) 20 MG capsule Take 20 mg by mouth daily.   Yes [provider]  pioglitazone (ACTOS) 30 MG tablet Take 30 mg by mouth daily. 07/27/16  Yes [provider]  ramipril (ALTACE) 10 MG capsule Take 10 mg by mouth 2 (two) times daily. 05/18/16  Yes [provider]  spironolactone (ALDACTONE) 50 MG tablet Take 50 mg by mouth daily.   Yes [provider]  terazosin (HYTRIN) 10 MG capsule Take 10 mg by mouth daily.  05/18/16  Yes [provider]  torsemide (DEMADEX) 20 MG tablet Take 20 mg by mouth daily.   Yes [provider]  traZODone (DESYREL) 150 MG tablet Take 150 mg by mouth at bedtime. 09/06/16  Yes [provider]  TRESIBA FLEXTOUCH 200 UNIT/ML FlexTouch Pen Inject 30 Units into the skin in the morning. 12/17/20  Yes [provider]  trolamine salicylate (ASPERCREME) 10 % cream Apply 1 application topically as needed for muscle pain.   Yes [provider]  vitamin B-12 (CYANOCOBALAMIN) 1000 MCG tablet Take 1,000 mcg by mouth daily.   Yes [provider]  acetaminophen (TYLENOL) 650 MG CR tablet Take 650 mg by mouth every 8 (eight) hours as needed for pain. Patient not taking: Reported on 12/29/2020    [provider]     Family History  Problem Relation Age of Onset   Benign prostatic hyperplasia Father    Psoriasis Father    Hypertension Mother    Hyperlipidemia Mother    Diabetes Maternal Grandmother    Diabetes Paternal Grandmother     Social History   Socioeconomic History   Marital status: Married     Spouse name: Not on file   Number of children: Not on file   Years of education: Not on file   Highest education level: Not on file  Occupational History   Not on file  Tobacco Use   Smoking status: Never   Smokeless tobacco: Never  Vaping Use   Vaping Use: Never used  Substance and Sexual Activity   Alcohol use: No   Drug use: No   Sexual activity: Not on file  Other Topics Concern   Not on file  Social History Narrative   Not on file   Social Determinants of Health   Financial Resource Strain: Not on file  Food Insecurity: Not on file  Transportation Needs: Not on file  Physical Activity: Not on file  Stress: Not on file  Social Connections: Not on file   Review of Systems: A 12 point ROS discussed and pertinent positives are indicated in the HPI above.  All other systems are negative.  Review of Systems  Vital Signs:  BP 122/69 (BP Location: Right Wrist)    Pulse 78    Temp (!) 97.5 F (36.4 C) (Oral)    Resp 18    Ht _0  (1.702 m)    Wt (!) 349 lb (158.3 kg)    SpO2 98%    BMI 54.66 kg/m   Physical Exam Constitutional:      Appearance: Normal appearance.  HENT:     Head: Normocephalic and atraumatic.  Cardiovascular:     Rate and Rhythm: Normal rate and regular rhythm.  Pulmonary:     Effort: Pulmonary effort is normal. No respiratory distress.  Abdominal:     Palpations: Abdomen is soft.  Skin:    General: Skin is warm and dry.  Neurological:     Mental Status: He is alert and oriented to person, place, and time.    Imaging: DG Chest 2 View  Result Date: 12/14/2020 CLINICAL DATA:  Short of breath and hyperglycemia EXAM: CHEST - 2 VIEW COMPARISON:  None. FINDINGS: Normal mediastinum and cardiac silhouette. Normal pulmonary vasculature. No evidence of effusion, infiltrate, or pneumothorax. No acute bony abnormality. IMPRESSION: No acute cardiopulmonary process. Electronically Signed   By: Suzy Bouchard M.D.   On: 12/14/2020 12:20   US Venous Img  Upper Uni Right(DVT)  Result Date: 12/28/2020 CLINICAL DATA:  Right arm swelling EXAM: RIGHT UPPER EXTREMITY VENOUS DOPPLER ULTRASOUND TECHNIQUE: Gray-scale sonography with graded compression, as well as color Doppler and duplex ultrasound were performed to evaluate the upper extremity deep venous system from the level of the subclavian vein and including the jugular, axillary, basilic, radial, ulnar and upper cephalic vein. Spectral Doppler was utilized to evaluate flow at rest and with distal augmentation maneuvers. COMPARISON:  None. FINDINGS: Contralateral Subclavian Vein: Respiratory phasicity is normal and symmetric with the symptomatic side. No evidence of thrombus. Normal compressibility. Internal Jugular Vein: No evidence of thrombus. Normal compressibility, respiratory phasicity and response to augmentation. Subclavian Vein: No evidence of thrombus. Normal compressibility, respiratory phasicity and response to augmentation. Axillary Vein: No evidence of thrombus. Normal compressibility, respiratory phasicity and response to augmentation. Cephalic Vein: Occlusive thrombus seen in the left cephalic vein from the shoulder to the antecubital region. Basilic Vein: No evidence of thrombus. Normal compressibility, respiratory phasicity and response to augmentation. Brachial Veins: No evidence of thrombus. Normal compressibility, respiratory phasicity and response to augmentation. Radial Veins: No evidence of thrombus. Normal compressibility, respiratory phasicity and response to augmentation. Ulnar Veins: No evidence of thrombus. Normal compressibility, respiratory phasicity and response to augmentation. Venous Reflux:  None visualized. Other Findings:  None visualized. IMPRESSION: Occlusive thrombus in the right upper arm cephalic vein from the shoulder to the elbow. Electronically Signed   By: Rolm Baptise M.D.   On: 12/28/2020 22:44   ECHOCARDIOGRAM COMPLETE  Result Date: 12/23/2020    ECHOCARDIOGRAM  REPORT   Patient Name:   ARSHAD OBERHOLZER Date of Exam: 12/23/2020 Medical Rec #:  [id number]         Height:       67.0 in Accession #:    [id number]        Weight:       349.0 lb Date of Birth:  May 09, 1965        BSA:          2.562 m Patient Age:    42 years          BP:           129/44 mmHg Patient Gender: M  HR:           82 bpm. Exam Location:  [facility] Procedure: 2D Echo, Cardiac Doppler and Color Doppler Indications:     Pericardial Effusion I31.3  History:         Patient has no prior history of Echocardiogram examinations.                  Risk Factors:Diabetes, Hypertension, Dyslipidemia and Sleep                  Apnea.  Sonographer:     Sherrie Sport Referring Phys:  [id number] [facility] AMIN Diagnosing Phys: Ida Rogue MD  Sonographer Comments: Suboptimal apical window and no subcostal window. IMPRESSIONS  1. Left ventricular ejection fraction, by estimation, is 60 to 65%. The left ventricle has normal function. The left ventricle has no regional wall motion abnormalities. There is moderate left ventricular hypertrophy. Left ventricular diastolic parameters are consistent with Grade II diastolic dysfunction (pseudonormalization).  2. Right ventricular systolic function is normal. The right ventricular size is normal. There is normal pulmonary artery systolic pressure. The estimated right ventricular systolic pressure is 57.8 mmHg.  3. Left atrial size was mildly dilated.  4. A small to moderate noncircumferential pericardial effusion is present.No tamponade.  5. The mitral valve is normal in structure. No evidence of mitral valve regurgitation. No evidence of mitral stenosis.  6. The aortic valve was not well visualized. Aortic valve regurgitation is not visualized. No aortic stenosis is present.  7. The inferior vena cava is normal in size with greater than 50% respiratory variability, suggesting right atrial pressure of 3 mmHg. FINDINGS  Left Ventricle: Left ventricular ejection fraction, by  estimation, is 60 to 65%. The left ventricle has normal function. The left ventricle has no regional wall motion abnormalities. The left ventricular internal cavity size was normal in size. There is  moderate left ventricular hypertrophy. Left ventricular diastolic parameters are consistent with Grade II diastolic dysfunction (pseudonormalization). Right Ventricle: The right ventricular size is normal. No increase in right ventricular wall thickness. Right ventricular systolic function is normal. There is normal pulmonary artery systolic pressure. The tricuspid regurgitant velocity is 1.59 m/s, and  with an assumed right atrial pressure of 5 mmHg, the estimated right ventricular systolic pressure is 46.9 mmHg. Left Atrium: Left atrial size was mildly dilated. Right Atrium: Right atrial size was normal in size. Pericardium: A small pericardial effusion is present. There is no evidence of cardiac tamponade. Mitral Valve: The mitral valve is normal in structure. No evidence of mitral valve regurgitation. No evidence of mitral valve stenosis. MV peak gradient, 5.7 mmHg. The mean mitral valve gradient is 3.0 mmHg. Tricuspid Valve: The tricuspid valve is normal in structure. Tricuspid valve regurgitation is mild . No evidence of tricuspid stenosis. Aortic Valve: The aortic valve was not well visualized. Aortic valve regurgitation is not visualized. No aortic stenosis is present. Aortic valve mean gradient measures 4.0 mmHg. Aortic valve peak gradient measures 7.4 mmHg. Aortic valve area, by VTI measures 4.99 cm. Pulmonic Valve: The pulmonic valve was normal in structure. Pulmonic valve regurgitation is not visualized. No evidence of pulmonic stenosis. Aorta: The aortic root is normal in size and structure. Venous: The inferior vena cava is normal in size with greater than 50% respiratory variability, suggesting right atrial pressure of 3 mmHg. IAS/Shunts: No atrial level shunt detected by color flow Doppler.  LEFT  VENTRICLE PLAX 2D LVIDd:         5.70 cm  Diastology LVIDs:         3.50 cm   LV e' medial:    6.64 cm/s LV PW:         1.40 cm   LV E/e' medial:  18.4 LV IVS:        1.20 cm   LV e' lateral:   10.30 cm/s LVOT diam:     2.20 cm   LV E/e' lateral: 11.8 LV SV:         111 LV SV Index:   43 LVOT Area:     3.80 cm  RIGHT VENTRICLE RV Basal diam:  5.00 cm RV S prime:     14.90 cm/s TAPSE (M-mode): 5.2 cm LEFT ATRIUM              Index        RIGHT ATRIUM           Index LA diam:        4.70 cm  1.83 cm/m   RA Area:     46.30 cm LA Vol (A2C):   126.0 ml 49.18 ml/m  RA Volume:   221.00 ml 86.26 ml/m LA Vol (A4C):   105.0 ml 40.98 ml/m LA Biplane Vol: 118.0 ml 46.06 ml/m  AORTIC VALVE                    PULMONIC VALVE AV Area (Vmax):    4.64 cm     PV Vmax:        1.06 m/s AV Area (Vmean):   3.99 cm     PV Vmean:       82.200 cm/s AV Area (VTI):     4.99 cm     PV VTI:         0.204 m AV Vmax:           136.00 cm/s  PV Peak grad:   4.5 mmHg AV Vmean:          92.900 cm/s  PV Mean grad:   3.0 mmHg AV VTI:            0.222 m      RVOT Peak grad: 7 mmHg AV Peak Grad:      7.4 mmHg AV Mean Grad:      4.0 mmHg LVOT Vmax:         166.00 cm/s LVOT Vmean:        97.500 cm/s LVOT VTI:          0.291 m LVOT/AV VTI ratio: 1.31  AORTA Ao Root diam: 3.33 cm MITRAL VALVE                TRICUSPID VALVE MV Area (PHT): 3.33 cm     TR Peak grad:   10.1 mmHg MV Area VTI:   3.76 cm     TR Vmax:        159.00 cm/s MV Peak grad:  5.7 mmHg MV Mean grad:  3.0 mmHg     SHUNTS MV Vmax:       1.19 m/s     Systemic VTI:  0.29 m MV Vmean:      82.0 cm/s    Systemic Diam: 2.20 cm MV Decel Time: 228 msec     Pulmonic VTI:  0.260 m MV E velocity: 122.00 cm/s MV A velocity: 121.00 cm/s MV E/A ratio:  1.01 Ida Rogue MD Electronically signed by Ida Rogue MD Signature Date/Time: 12/23/2020/10:15:34 AM    Final  Labs:  CBC: Recent Labs    12/31/20 0345 01/01/21 0453 01/02/21 0316 01/03/21 0326  WBC 1.8* 2.1* 2.5* 3.0*  HGB  7.5* 7.8* 7.9* 7.2*  HCT 22.3* 22.5* 22.8* 20.7*  PLT 329 335 366 353    COAGS: No results for input(s): INR, APTT in the last 8760 hours.  BMP: Recent Labs    12/23/20 0631 12/28/20 2116 12/29/20 0442 12/30/20 0501  NA 135 132* 134* 137  K 4.1 4.5 4.1 4.1  CL 105 103 105 106  CO2 _0 GLUCOSE 191* 132* 110* 125*  BUN 35* 92* 83* 48*  CALCIUM 8.6* 8.8* 8.7* 9.2  CREATININE 1.08 2.06* 1.55* 1.19  GFRNONAA >60 38* 53* >60    LIVER FUNCTION TESTS: Recent Labs    12/28/20 2116 12/29/20 0442  BILITOT 1.2 1.2  AST 20 21  ALT 17 17  ALKPHOS 35* 33*  PROT 6.8 6.4*  ALBUMIN 3.5 3.4*   Assessment and Plan: 55 year old male with PMHx significant for IgA deficiency who was admitted for anemia and has been seen by GI with egd/colonoscopy recently performed 12/8 with no active bleeding seen. Patient has been seen by Hematology for neutropenia and anemia and request received for image guided bone marrow biopsy with moderate sedation.  The patient has been NPO, labs and vitals have been reviewed.  Risks and benefits of image guided bone marrow biopsy with moderate sedation was discussed with the patient and/or patient's family including, but not limited to bleeding, infection, damage to adjacent structures or low yield requiring additional tests.  All of the questions were answered and there is agreement to proceed.  Consent signed and in chart.   Thank you for this interesting consult.  I greatly enjoyed meeting Patrick Duncan and look forward to participating in their care.  A copy of this report was sent to the requesting provider on this date.  Electronically Signed: Hedy Jacob, PA-C 01/03/2021, 9:11 AM   I spent a total of 20 Minutes in face to face in clinical consultation, greater than 50% of which was counseling/coordinating care for anemia and neutropenia.

## 2021-01-03 NOTE — Plan of Care (Signed)
°  Problem: Education: Goal: Knowledge of General Education information will improve Description: Including pain rating scale, medication(s)/side effects and non-pharmacologic comfort measures Outcome: Progressing   Problem: Clinical Measurements: Goal: Respiratory complications will improve Outcome: Progressing   Problem: Activity: Goal: Risk for activity intolerance will decrease Outcome: Progressing   Problem: Nutrition: Goal: Adequate nutrition will be maintained Outcome: Progressing   Problem: Coping: Goal: Level of anxiety will decrease Outcome: Progressing   Problem: Pain Managment: Goal: General experience of comfort will improve Outcome: Progressing   Problem: Safety: Goal: Ability to remain free from injury will improve Outcome: Progressing   Problem: Skin Integrity: Goal: Risk for impaired skin integrity will decrease Outcome: Progressing   

## 2021-01-03 NOTE — Progress Notes (Signed)
Pt c/o itching to R hip/thigh. Hives/welts noted to R thigh and hip, some on L hip and thigh as well but not itching. Pt states only thing he put on his legs this past evening was Hibiclens. Pt states he has used Hibiclens before and has never had a reaction. Pt satisfied with itch cream and will wait to see if welts go away. Pt refused benadryl at this time. WCTM.

## 2021-01-03 NOTE — Procedures (Signed)
Interventional Radiology Procedure Note  Date of Procedure: 01/03/2021  Procedure: BMBx  Findings:  1. CT guided BMBx of right posterior ilium  2. Additional samples not obtained due to bleeding encountered from first biopsy specimen    Complications: No immediate complications noted.   Estimated Blood Loss: minimal  Follow-up and Recommendations: 1. Bedrest 2 hours    Albin Felling, MD  Vascular & Interventional Radiology  01/03/2021 10:07 AM

## 2021-01-04 LAB — CBC WITH DIFFERENTIAL/PLATELET
Abs Immature Granulocytes: 0.08 10*3/uL — ABNORMAL HIGH (ref 0.00–0.07)
Basophils Absolute: 0 10*3/uL (ref 0.0–0.1)
Basophils Relative: 0 %
Eosinophils Absolute: 0 10*3/uL (ref 0.0–0.5)
Eosinophils Relative: 0 %
HCT: 22.3 % — ABNORMAL LOW (ref 39.0–52.0)
Hemoglobin: 7.8 g/dL — ABNORMAL LOW (ref 13.0–17.0)
Immature Granulocytes: 3 %
Lymphocytes Relative: 8 %
Lymphs Abs: 0.2 10*3/uL — ABNORMAL LOW (ref 0.7–4.0)
MCH: 33.3 pg (ref 26.0–34.0)
MCHC: 35 g/dL (ref 30.0–36.0)
MCV: 95.3 fL (ref 80.0–100.0)
Monocytes Absolute: 0.3 10*3/uL (ref 0.1–1.0)
Monocytes Relative: 11 %
Neutro Abs: 1.9 10*3/uL (ref 1.7–7.7)
Neutrophils Relative %: 78 %
Platelets: 331 10*3/uL (ref 150–400)
RBC: 2.34 MIL/uL — ABNORMAL LOW (ref 4.22–5.81)
RDW: 18.3 % — ABNORMAL HIGH (ref 11.5–15.5)
WBC: 2.5 10*3/uL — ABNORMAL LOW (ref 4.0–10.5)
nRBC: 0 % (ref 0.0–0.2)

## 2021-01-04 LAB — GLUCOSE, CAPILLARY: Glucose-Capillary: 225 mg/dL — ABNORMAL HIGH (ref 70–99)

## 2021-01-04 NOTE — Discharge Summary (Signed)
Physician Discharge Summary  VA BROADWELL KPV:[id number] DOB: Jul 16, 1965 DOA: 12/28/2020  PCP: Sofie Hartigan, MD  Admit date: 12/28/2020 Discharge date: 01/04/2021  Admitted From: Home Disposition: Home  Recommendations for Outpatient Follow-up:  Follow up with PCP in 1-2 weeks Follow-up in cancer center with Dr. Grayland Ormond 12/28  Home Health: No Equipment/Devices: None  Discharge Condition: Stable CODE STATUS: Full Diet recommendation: Heart healthy/carb modified  Brief/Interim Summary: 55 y.o. male seen in ed with complaints of hemoglobin of 6.2.  Patient coming to [place] from home/ pcp office.  Patient was recently admitted on 12/21/2020 with bright red blood per rectum patient does use Aleve which I have asked him to refrain from using Aleve and any NSAIDs. Patient states that other than weakness he was actually doing well and was not feeling bad in any way.  Patient denies any headaches blurred vision chest pain shortness of breath nausea vomiting.  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   Pt has past medical history of arthritis, cellulitis, depression, diabetes mellitus type 2, hypertension, IgA deficiency, morbid obesity, sleep apnea, hyperlipidemia.  Patient was admitted on 12 -6-22 fro anemia and had transfusion and egd and colonoscopy.    12/14: Hemoglobin dropped to 5.5.  Ordered 2 units transfusion however pending arrival of washed PRBCs from outside facility.  Patient hemodynamically stable.  Discussed with GI.  Recent colonoscopy and EGD.  So we will proceed with video capsule endoscopy today   12/16: Capsule study negative.  Unclear source of blood loss.  Considering unexplained anemia and leukopenia discussed with hematology.  Decision made to proceed with bone marrow biopsy.  Inpatient versus outpatient.   12/17: Patient hemoglobin relatively stable.  Patient remains  leukopenic.  Unclear etiology.  Plan to proceed with inpatient bone marrow biopsy on Monday 12/19.  12/20: Patient received 1 unit PRBC on 12/19.  Discharge delayed as washed RBCs needed to come from [facility].  Hemoglobin 7.8 at time of discharge.  Patient is at risk for worsening anemia post discharge.  He has a follow-up arranged in the cancer center on 12/28.  He is encouraged to keep his appointment.  He expressed understanding.  Patient will also follow-up with his PCP.    Discharge Diagnoses:  Principal Problem:   Acute blood loss anemia Active Problems:   Diabetes (HCC)   HTN (hypertension)   OSA (obstructive sleep apnea)   AKI (acute kidney injury) ([place])   GI bleed   Acute on chronic blood loss anemia  Acute on chronic anemia Possible slow GI bleed Likely GI source however recent colonoscopy and EGD negative for acute bleed Discussed with GI, appreciate consultation Patient needs washed PRBC Transfused 2 units on 12/14 S/p video endoscopy, results negative  Plan: Unclear nature of acute on chronic anemia.  Could be related to bone marrow dysfunction.  Received 1 additional unit PRBC on 12/19.  Hemoglobin stable on 12/20.  No bleeding.  Discharge home.  Follow-up in cancer center on 12/28.   Pancytopenia Unclear etiology.  Patient with anemia and leukopenia.  Chronicity unclear as well.  Discussed with hematology Status post inpatient bone marrow biopsy 12/19 Plan: Follow-up in cancer center 12/28   History of IgA deficiency Per patient this was diagnosed after recurrent illnesses as a child Does not see immunology or hematology as outpatient No specific immunodeficiency medications Immunoglobulin panel significant for decreased IgA, consistent with IgA deficiency Plan: Hematology recs appreciated Follow-up  in cancer center 12/28 to discuss bone marrow biopsy results and possible referral to tertiary center for IgA deficiency   Essential hypertension Blood pressure  reasonably well controlled over interval Likely in the setting of acute blood loss anemia At home patient takes amlodipine, ramipril, Aldactone, torsemide, metoprolol titrate Plan: Continue metoprolol tartrate at home dose of 50 mg twice daily Continue Demadex 20 mg daily Continue amlodipine Continue ramipril   Type 2 diabetes mellitus Oral agents on hold Can resume on discharge   Acute kidney injury Avoid nonessential nephrotoxins Monitor kidney function Kidney function improved   Morbid obesity BMI 54 This complicates overall care and prognosis Request RD consultation  Discharge Instructions  Discharge Instructions     Diet - low sodium heart healthy   Complete by: As directed    Increase activity slowly   Complete by: As directed    No wound care   Complete by: As directed       Allergies as of 01/04/2021       Reactions   Ampicillin Diarrhea   Bactrim [sulfamethoxazole-trimethoprim] Hives   Cephalexin    Claritin [loratadine] Other (See Comments)   Irritates throat   Penicillins Rash        Medication List     STOP taking these medications    acetaminophen 650 MG CR tablet Commonly known as: TYLENOL       TAKE these medications    acarbose 25 MG tablet Commonly known as: PRECOSE Take 25 mg by mouth 3 (three) times daily with meals.   amLODipine 10 MG tablet Commonly known as: NORVASC Take 10 mg by mouth daily.   dorzolamide-timolol 22.3-6.8 MG/ML ophthalmic solution Commonly known as: COSOPT Place 1 drop into the right eye 2 (two) times daily.   Farxiga 10 MG Tabs tablet Generic drug: dapagliflozin propanediol Take 10 mg by mouth daily.   fenofibrate 160 MG tablet Take 160 mg by mouth daily.   ferrous sulfate 325 (65 FE) MG EC tablet Take 1 tablet (325 mg total) by mouth 2 (two) times daily.   fexofenadine 180 MG tablet Commonly known as: ALLEGRA Take 180 mg by mouth daily.   fluticasone 50 MCG/ACT nasal spray Commonly known  as: FLONASE Place 2 sprays into both nostrils daily as needed.   glimepiride 4 MG tablet Commonly known as: AMARYL Take 4 mg by mouth daily with breakfast.   Hibiclens 4 % external liquid Generic drug: chlorhexidine APPLY TOPICALLY DAILY AS NEEDED. TO WASHLEGS AND GROIN AREA   Hydrocortisone-Aloe 1 % Crea Apply 1 application topically 2 (two) times daily as needed (itching). To itchy area on legs   ketoconazole 2 % cream Commonly known as: NIZORAL Apply to rash in groin once a day as needed for flares.   lidocaine 4 % cream Commonly known as: LMX Apply 1 application topically as needed.   lovastatin 10 MG tablet Commonly known as: MEVACOR Take 10 mg by mouth daily.   metFORMIN 500 MG 24 hr tablet Commonly known as: GLUCOPHAGE-XR Take 500-1,000 mg by mouth 2 (two) times daily. Takes 1 tablet with breakfast and 2 tablets with supper   metoprolol tartrate 50 MG tablet Commonly known as: LOPRESSOR Take 50 mg by mouth 2 (two) times daily.   mupirocin ointment 2 % Commonly known as: BACTROBAN Apply to boils and ulcers QD PRN flares.   omeprazole 20 MG capsule Commonly known as: PRILOSEC Take 20 mg by mouth daily.   pioglitazone 30 MG tablet Commonly known as: ACTOS  Take 30 mg by mouth daily.   ramipril 10 MG capsule Commonly known as: ALTACE Take 10 mg by mouth 2 (two) times daily.   spironolactone 50 MG tablet Commonly known as: ALDACTONE Take 50 mg by mouth daily.   terazosin 10 MG capsule Commonly known as: HYTRIN Take 10 mg by mouth daily.   torsemide 20 MG tablet Commonly known as: DEMADEX Take 20 mg by mouth daily.   traZODone 150 MG tablet Commonly known as: DESYREL Take 150 mg by mouth at bedtime.   Tyler Aas FlexTouch 200 UNIT/ML FlexTouch Pen Generic drug: insulin degludec Inject 30 Units into the skin in the morning.   trolamine salicylate 10 % cream Commonly known as: ASPERCREME Apply 1 application topically as needed for muscle pain.    vitamin B-12 1000 MCG tablet Commonly known as: CYANOCOBALAMIN Take 1,000 mcg by mouth daily.        Follow-up Information     Lloyd Huger, MD Follow up on 01/12/2021.   Specialty: Oncology Why: Follow up on Dec 28th.  Please call cancer center to confirm appt date and time Contact information: [place] [address] [phone]         Sofie Hartigan, MD. Schedule an appointment as soon as possible for a visit in 1 week(s).   Specialty: Family Medicine Contact information: [address] [place] [address] [phone]                Allergies  Allergen Reactions   Ampicillin Diarrhea   Bactrim [Sulfamethoxazole-Trimethoprim] Hives   Cephalexin    Claritin [Loratadine] Other (See Comments)    Irritates throat   Penicillins Rash    Consultations: Hematology   Procedures/Studies: DG Chest 2 View  Result Date: 12/14/2020 CLINICAL DATA:  Short of breath and hyperglycemia EXAM: CHEST - 2 VIEW COMPARISON:  None. FINDINGS: Normal mediastinum and cardiac silhouette. Normal pulmonary vasculature. No evidence of effusion, infiltrate, or pneumothorax. No acute bony abnormality. IMPRESSION: No acute cardiopulmonary process. Electronically Signed   By: Suzy Bouchard M.D.   On: 12/14/2020 12:20   US Venous Img Upper Uni Right(DVT)  Result Date: 12/28/2020 CLINICAL DATA:  Right arm swelling EXAM: RIGHT UPPER EXTREMITY VENOUS DOPPLER ULTRASOUND TECHNIQUE: Gray-scale sonography with graded compression, as well as color Doppler and duplex ultrasound were performed to evaluate the upper extremity deep venous system from the level of the subclavian vein and including the jugular, axillary, basilic, radial, ulnar and upper cephalic vein. Spectral Doppler was utilized to evaluate flow at rest and with distal augmentation maneuvers. COMPARISON:  None. FINDINGS: Contralateral Subclavian Vein: Respiratory phasicity is normal and symmetric with  the symptomatic side. No evidence of thrombus. Normal compressibility. Internal Jugular Vein: No evidence of thrombus. Normal compressibility, respiratory phasicity and response to augmentation. Subclavian Vein: No evidence of thrombus. Normal compressibility, respiratory phasicity and response to augmentation. Axillary Vein: No evidence of thrombus. Normal compressibility, respiratory phasicity and response to augmentation. Cephalic Vein: Occlusive thrombus seen in the left cephalic vein from the shoulder to the antecubital region. Basilic Vein: No evidence of thrombus. Normal compressibility, respiratory phasicity and response to augmentation. Brachial Veins: No evidence of thrombus. Normal compressibility, respiratory phasicity and response to augmentation. Radial Veins: No evidence of thrombus. Normal compressibility, respiratory phasicity and response to augmentation. Ulnar Veins: No evidence of thrombus. Normal compressibility, respiratory phasicity and response to augmentation. Venous Reflux:  None visualized. Other Findings:  None visualized. IMPRESSION: Occlusive thrombus in the right upper arm  cephalic vein from the shoulder to the elbow. Electronically Signed   By: Rolm Baptise M.D.   On: 12/28/2020 22:44   CT BONE MARROW BIOPSY & ASPIRATION  Result Date: 01/03/2021 INDICATION: Anemia EXAM: CT BONE MARROW BIOPSY AND ASPIRATION MEDICATIONS: None. ANESTHESIA/SEDATION: Moderate (conscious) sedation was employed during this procedure. A total of Versed 0.5 mg and Fentanyl 50 mcg was administered intravenously. Moderate Sedation Time: 13 minutes. The patient's level of consciousness and vital signs were monitored continuously by radiology nursing throughout the procedure under my direct supervision. FLUOROSCOPY TIME:  N/a COMPLICATIONS: None immediate. PROCEDURE: Informed written consent was obtained from the patient after a thorough discussion of the procedural risks, benefits and alternatives. All  questions were addressed. Maximal Sterile Barrier Technique was utilized including caps, mask, sterile gowns, sterile gloves, sterile drape, hand hygiene and skin antiseptic. A timeout was performed prior to the initiation of the procedure. The patient was placed prone on the CT exam table. Limited CT of the pelvis was performed for planning purposes. Skin entry site was marked, and the overlying skin was prepped and draped in the standard sterile fashion. Local analgesia was obtained with 1% lidocaine. Using CT guidance, an 11 gauge needle was advanced just deep to the cortex of the right posterior ilium. Subsequently, bone marrow aspiration and core biopsy were performed. Specimens were submitted to lab/pathology for handling. Hemostasis was achieved with manual pressure, and a clean dressing was placed. The patient tolerated the procedure well without immediate complication. IMPRESSION: Successful CT-guided bone marrow aspiration and core biopsy of the right posterior ilium. Electronically Signed   By: Albin Felling M.D.   On: 01/03/2021 14:25   ECHOCARDIOGRAM COMPLETE  Result Date: 12/23/2020    ECHOCARDIOGRAM REPORT   Patient Name:   BARON PARMELEE Date of Exam: 12/23/2020 Medical Rec #:  [id number]         Height:       67.0 in Accession #:    [id number]        Weight:       349.0 lb Date of Birth:  05-20-1965        BSA:          2.562 m Patient Age:    55 years          BP:           129/44 mmHg Patient Gender: M                 HR:           82 bpm. Exam Location:  [facility] Procedure: 2D Echo, Cardiac Doppler and Color Doppler Indications:     Pericardial Effusion I31.3  History:         Patient has no prior history of Echocardiogram examinations.                  Risk Factors:Diabetes, Hypertension, Dyslipidemia and Sleep                  Apnea.  Sonographer:     Sherrie Sport Referring Phys:  [id number] [facility] AMIN Diagnosing Phys: Ida Rogue MD  Sonographer Comments: Suboptimal apical window and no  subcostal window. IMPRESSIONS  1. Left ventricular ejection fraction, by estimation, is 60 to 65%. The left ventricle has normal function. The left ventricle has no regional wall motion abnormalities. There is moderate left ventricular hypertrophy. Left ventricular diastolic parameters are consistent with Grade II diastolic dysfunction (pseudonormalization).  2. Right ventricular systolic  function is normal. The right ventricular size is normal. There is normal pulmonary artery systolic pressure. The estimated right ventricular systolic pressure is 21.1 mmHg.  3. Left atrial size was mildly dilated.  4. A small to moderate noncircumferential pericardial effusion is present.No tamponade.  5. The mitral valve is normal in structure. No evidence of mitral valve regurgitation. No evidence of mitral stenosis.  6. The aortic valve was not well visualized. Aortic valve regurgitation is not visualized. No aortic stenosis is present.  7. The inferior vena cava is normal in size with greater than 50% respiratory variability, suggesting right atrial pressure of 3 mmHg. FINDINGS  Left Ventricle: Left ventricular ejection fraction, by estimation, is 60 to 65%. The left ventricle has normal function. The left ventricle has no regional wall motion abnormalities. The left ventricular internal cavity size was normal in size. There is  moderate left ventricular hypertrophy. Left ventricular diastolic parameters are consistent with Grade II diastolic dysfunction (pseudonormalization). Right Ventricle: The right ventricular size is normal. No increase in right ventricular wall thickness. Right ventricular systolic function is normal. There is normal pulmonary artery systolic pressure. The tricuspid regurgitant velocity is 1.59 m/s, and  with an assumed right atrial pressure of 5 mmHg, the estimated right ventricular systolic pressure is 17.3 mmHg. Left Atrium: Left atrial size was mildly dilated. Right Atrium: Right atrial size was  normal in size. Pericardium: A small pericardial effusion is present. There is no evidence of cardiac tamponade. Mitral Valve: The mitral valve is normal in structure. No evidence of mitral valve regurgitation. No evidence of mitral valve stenosis. MV peak gradient, 5.7 mmHg. The mean mitral valve gradient is 3.0 mmHg. Tricuspid Valve: The tricuspid valve is normal in structure. Tricuspid valve regurgitation is mild . No evidence of tricuspid stenosis. Aortic Valve: The aortic valve was not well visualized. Aortic valve regurgitation is not visualized. No aortic stenosis is present. Aortic valve mean gradient measures 4.0 mmHg. Aortic valve peak gradient measures 7.4 mmHg. Aortic valve area, by VTI measures 4.99 cm. Pulmonic Valve: The pulmonic valve was normal in structure. Pulmonic valve regurgitation is not visualized. No evidence of pulmonic stenosis. Aorta: The aortic root is normal in size and structure. Venous: The inferior vena cava is normal in size with greater than 50% respiratory variability, suggesting right atrial pressure of 3 mmHg. IAS/Shunts: No atrial level shunt detected by color flow Doppler.  LEFT VENTRICLE PLAX 2D LVIDd:         5.70 cm   Diastology LVIDs:         3.50 cm   LV e' medial:    6.64 cm/s LV PW:         1.40 cm   LV E/e' medial:  18.4 LV IVS:        1.20 cm   LV e' lateral:   10.30 cm/s LVOT diam:     2.20 cm   LV E/e' lateral: 11.8 LV SV:         111 LV SV Index:   43 LVOT Area:     3.80 cm  RIGHT VENTRICLE RV Basal diam:  5.00 cm RV S prime:     14.90 cm/s TAPSE (M-mode): 5.2 cm LEFT ATRIUM              Index        RIGHT ATRIUM           Index LA diam:        4.70 cm  1.83  cm/m   RA Area:     46.30 cm LA Vol (A2C):   126.0 ml 49.18 ml/m  RA Volume:   221.00 ml 86.26 ml/m LA Vol (A4C):   105.0 ml 40.98 ml/m LA Biplane Vol: 118.0 ml 46.06 ml/m  AORTIC VALVE                    PULMONIC VALVE AV Area (Vmax):    4.64 cm     PV Vmax:        1.06 m/s AV Area (Vmean):   3.99 cm      PV Vmean:       82.200 cm/s AV Area (VTI):     4.99 cm     PV VTI:         0.204 m AV Vmax:           136.00 cm/s  PV Peak grad:   4.5 mmHg AV Vmean:          92.900 cm/s  PV Mean grad:   3.0 mmHg AV VTI:            0.222 m      RVOT Peak grad: 7 mmHg AV Peak Grad:      7.4 mmHg AV Mean Grad:      4.0 mmHg LVOT Vmax:         166.00 cm/s LVOT Vmean:        97.500 cm/s LVOT VTI:          0.291 m LVOT/AV VTI ratio: 1.31  AORTA Ao Root diam: 3.33 cm MITRAL VALVE                TRICUSPID VALVE MV Area (PHT): 3.33 cm     TR Peak grad:   10.1 mmHg MV Area VTI:   3.76 cm     TR Vmax:        159.00 cm/s MV Peak grad:  5.7 mmHg MV Mean grad:  3.0 mmHg     SHUNTS MV Vmax:       1.19 m/s     Systemic VTI:  0.29 m MV Vmean:      82.0 cm/s    Systemic Diam: 2.20 cm MV Decel Time: 228 msec     Pulmonic VTI:  0.260 m MV E velocity: 122.00 cm/s MV A velocity: 121.00 cm/s MV E/A ratio:  1.01 Ida Rogue MD Electronically signed by Ida Rogue MD Signature Date/Time: 12/23/2020/10:15:34 AM    Final       Subjective: Seen and examined the day of discharge.  Stable no distress.  All questions answered.  Stable for discharge home.  Discharge Exam: Vitals:   01/04/21 0621 01/04/21 0819  BP: (!) 123/51 (!) 134/52  Pulse: 75 80  Resp: 17 16  Temp: 97.7 F (36.5 C) 98 F (36.7 C)  SpO2: 98% 100%   Vitals:   01/04/21 0053 01/04/21 0127 01/04/21 0621 01/04/21 0819  BP: (!) 129/51 125/67 (!) 123/51 (!) 134/52  Pulse: 82 77 75 80  Resp: 16 18 17 16   Temp: 98.3 F (36.8 C) 97.9 F (36.6 C) 97.7 F (36.5 C) 98 F (36.7 C)  TempSrc: Oral Oral  Oral  SpO2: 100%  98% 100%  Weight:      Height:        General: Pt is alert, awake, not in acute distress Cardiovascular: RRR, S1/S2 +, no rubs, no gallops Respiratory: CTA bilaterally, no wheezing, no rhonchi Abdominal: Soft, NT,  ND, bowel sounds + Extremities: no edema, no cyanosis    The results of significant diagnostics from this hospitalization  (including imaging, microbiology, ancillary and laboratory) are listed below for reference.     Microbiology: Recent Results (from the past 240 hour(s))  Resp Panel by RT-PCR (Flu A&B, Covid) Nasopharyngeal Swab     Status: None   Collection Time: 12/28/20  9:16 PM   Specimen: Nasopharyngeal Swab; Nasopharyngeal(NP) swabs in vial transport medium  Result Value Ref Range Status   SARS Coronavirus 2 by RT PCR NEGATIVE NEGATIVE Final    Comment: (NOTE) SARS-CoV-2 target nucleic acids are NOT DETECTED.  The SARS-CoV-2 RNA is generally detectable in upper respiratory specimens during the acute phase of infection. The lowest concentration of SARS-CoV-2 viral copies this assay can detect is 138 copies/mL. A negative result does not preclude SARS-Cov-2 infection and should not be used as the sole basis for treatment or other patient management decisions. A negative result may occur with  improper specimen collection/handling, submission of specimen other than nasopharyngeal swab, presence of viral mutation(s) within the areas targeted by this assay, and inadequate number of viral copies(<138 copies/mL). A negative result must be combined with clinical observations, patient history, and epidemiological information. The expected result is Negative.  Fact Sheet for Patients:  EntrepreneurPulse.com.au  Fact Sheet for Healthcare Providers:  IncredibleEmployment.be  This test is no t yet approved or cleared by the [place] FDA and  has been authorized for detection and/or diagnosis of SARS-CoV-2 by FDA under an Emergency Use Authorization (EUA). This EUA will remain  in effect (meaning this test can be used) for the duration of the COVID-19 declaration under Section 564(b)(1) of the Act, 21 U.S.C.section 360bbb-3(b)(1), unless the authorization is terminated  or revoked sooner.       Influenza A by PCR NEGATIVE NEGATIVE Final   Influenza B by PCR  NEGATIVE NEGATIVE Final    Comment: (NOTE) The Xpert Xpress SARS-CoV-2/FLU/RSV plus assay is intended as an aid in the diagnosis of influenza from Nasopharyngeal swab specimens and should not be used as a sole basis for treatment. Nasal washings and aspirates are unacceptable for Xpert Xpress SARS-CoV-2/FLU/RSV testing.  Fact Sheet for Patients: EntrepreneurPulse.com.au  Fact Sheet for Healthcare Providers: IncredibleEmployment.be  This test is not yet approved or cleared by the [place] FDA and has been authorized for detection and/or diagnosis of SARS-CoV-2 by FDA under an Emergency Use Authorization (EUA). This EUA will remain in effect (meaning this test can be used) for the duration of the COVID-19 declaration under Section 564(b)(1) of the Act, 21 U.S.C. section 360bbb-3(b)(1), unless the authorization is terminated or revoked.  Performed at [facility], [address] [place], [place] [address]      Labs: BNP (last 3 results) No results for input(s): BNP in the last 8760 hours. Basic Metabolic Panel: Recent Labs  Lab 12/28/20 2116 12/29/20 0442 12/30/20 0501  NA 132* 134* 137  K 4.5 4.1 4.1  CL 103 105 106  CO2 22 22 24   GLUCOSE 132* 110* 125*  BUN 92* 83* 48*  CREATININE 2.06* 1.55* 1.19  CALCIUM 8.8* 8.7* 9.2   Liver Function Tests: Recent Labs  Lab 12/28/20 2116 12/29/20 0442  AST 20 21  ALT 17 17  ALKPHOS 35* 33*  BILITOT 1.2 1.2  PROT 6.8 6.4*  ALBUMIN 3.5 3.4*   No results for input(s): LIPASE, AMYLASE in the last 168 hours. No results for input(s): AMMONIA in the last 168  hours. CBC: Recent Labs  Lab 12/31/20 0345 01/01/21 0453 01/02/21 0316 01/03/21 0326 01/04/21 0349  WBC 1.8* 2.1* 2.5* 3.0* 2.5*  NEUTROABS 1.3* 1.6* 1.9 2.3 1.9  HGB 7.5* 7.8* 7.9* 7.2* 7.8*  HCT 22.3* 22.5* 22.8* 20.7* 22.3*  MCV 94.9 93.4 95.0 93.7 95.3  PLT 329 335 366 353 331   Cardiac Enzymes: No results  for input(s): CKTOTAL, CKMB, CKMBINDEX, TROPONINI in the last 168 hours. BNP: Invalid input(s): POCBNP CBG: Recent Labs  Lab 01/03/21 0747 01/03/21 1224 01/03/21 1712 01/03/21 1951 01/04/21 0818  GLUCAP 246* 209* 219* 255* 225*   D-Dimer No results for input(s): DDIMER in the last 72 hours. Hgb A1c No results for input(s): HGBA1C in the last 72 hours. Lipid Profile No results for input(s): CHOL, HDL, LDLCALC, TRIG, CHOLHDL, LDLDIRECT in the last 72 hours. Thyroid function studies No results for input(s): TSH, T4TOTAL, T3FREE, THYROIDAB in the last 72 hours.  Invalid input(s): FREET3 Anemia work up No results for input(s): VITAMINB12, FOLATE, FERRITIN, TIBC, IRON, RETICCTPCT in the last 72 hours. Urinalysis    Component Value Date/Time   COLORURINE YELLOW 12/22/2020 1500   APPEARANCEUR CLEAR (A) 12/22/2020 1500   LABSPEC 1.015 12/22/2020 1500   PHURINE 7.0 12/22/2020 1500   GLUCOSEU 500 (A) 12/22/2020 1500   HGBUR NEGATIVE 12/22/2020 1500   BILIRUBINUR NEGATIVE 12/22/2020 1500   KETONESUR 15 (A) 12/22/2020 1500   PROTEINUR NEGATIVE 12/22/2020 1500   NITRITE NEGATIVE 12/22/2020 1500   LEUKOCYTESUR NEGATIVE 12/22/2020 1500   [facility] Invalid input(s): PROCALCITONIN,  WBC,  LACTICIDVEN Microbiology Recent Results (from the past 240 hour(s))  Resp Panel by RT-PCR (Flu A&B, Covid) Nasopharyngeal Swab     Status: None   Collection Time: 12/28/20  9:16 PM   Specimen: Nasopharyngeal Swab; Nasopharyngeal(NP) swabs in vial transport medium  Result Value Ref Range Status   SARS Coronavirus 2 by RT PCR NEGATIVE NEGATIVE Final    Comment: (NOTE) SARS-CoV-2 target nucleic acids are NOT DETECTED.  The SARS-CoV-2 RNA is generally detectable in upper respiratory specimens during the acute phase of infection. The lowest concentration of SARS-CoV-2 viral copies this assay can detect is 138 copies/mL. A negative result does not preclude SARS-Cov-2 infection and should not be  used as the sole basis for treatment or other patient management decisions. A negative result may occur with  improper specimen collection/handling, submission of specimen other than nasopharyngeal swab, presence of viral mutation(s) within the areas targeted by this assay, and inadequate number of viral copies(<138 copies/mL). A negative result must be combined with clinical observations, patient history, and epidemiological information. The expected result is Negative.  Fact Sheet for Patients:  EntrepreneurPulse.com.au  Fact Sheet for Healthcare Providers:  IncredibleEmployment.be  This test is no t yet approved or cleared by the [place] FDA and  has been authorized for detection and/or diagnosis of SARS-CoV-2 by FDA under an Emergency Use Authorization (EUA). This EUA will remain  in effect (meaning this test can be used) for the duration of the COVID-19 declaration under Section 564(b)(1) of the Act, 21 U.S.C.section 360bbb-3(b)(1), unless the authorization is terminated  or revoked sooner.       Influenza A by PCR NEGATIVE NEGATIVE Final   Influenza B by PCR NEGATIVE NEGATIVE Final    Comment: (NOTE) The Xpert Xpress SARS-CoV-2/FLU/RSV plus assay is intended as an aid in the diagnosis of influenza from Nasopharyngeal swab specimens and should not be used as a sole basis for treatment. Nasal washings and aspirates  are unacceptable for Xpert Xpress SARS-CoV-2/FLU/RSV testing.  Fact Sheet for Patients: EntrepreneurPulse.com.au  Fact Sheet for Healthcare Providers: IncredibleEmployment.be  This test is not yet approved or cleared by the [place] FDA and has been authorized for detection and/or diagnosis of SARS-CoV-2 by FDA under an Emergency Use Authorization (EUA). This EUA will remain in effect (meaning this test can be used) for the duration of the COVID-19 declaration under Section  564(b)(1) of the Act, 21 U.S.C. section 360bbb-3(b)(1), unless the authorization is terminated or revoked.  Performed at [facility], [address], [place], [place] [address]      Time coordinating discharge: Over 30 minutes  SIGNED:   Sidney Ace, MD  [facility] 01/04/2021, 1:19 PM Pager   If 7PM-7AM, please contact night-coverage

## 2021-01-04 NOTE — Plan of Care (Signed)
°  Problem: Education: Goal: Knowledge of General Education information will improve Description: Including pain rating scale, medication(s)/side effects and non-pharmacologic comfort measures Outcome: Progressing   Problem: Clinical Measurements: Goal: Ability to maintain clinical measurements within normal limits will improve Outcome: Progressing   Problem: Activity: Goal: Risk for activity intolerance will decrease Outcome: Progressing   Problem: Nutrition: Goal: Adequate nutrition will be maintained Outcome: Progressing   Problem: Coping: Goal: Level of anxiety will decrease Outcome: Progressing   Problem: Pain Managment: Goal: General experience of comfort will improve Outcome: Progressing   Problem: Skin Integrity: Goal: Risk for impaired skin integrity will decrease Outcome: Progressing

## 2021-01-04 NOTE — Progress Notes (Signed)
Patient and wife verbalized understanding of all discharge instructions including follow up with oncology provider.

## 2021-01-04 NOTE — TOC Transition Note (Signed)
Transition of Care [facility]) - CM/SW Discharge Note   Patient Details  Name: KILO ESHELMAN MRN: [id number] Date of Birth: 11-11-1965  [facility]) CM/SW Contact:  Candie Chroman, LCSW Phone Number: 01/04/2021, 10:15 AM   Clinical Narrative:   Patient has orders to discharge home today. No further concerns. CSW signing off  Final next level of care: Home/Self Care Barriers to Discharge: Barriers Resolved   Patient Goals and CMS Choice        Discharge Placement                    Patient and family notified of of transfer: 01/04/21  Discharge Plan and Services   Discharge Planning Services: CM Consult                                 Social Determinants of Health (SDOH) Interventions     Readmission Risk Interventions No flowsheet data found.

## 2021-01-05 ENCOUNTER — Telehealth: Payer: Self-pay | Admitting: *Deleted

## 2021-01-05 ENCOUNTER — Other Ambulatory Visit: Payer: Self-pay | Admitting: *Deleted

## 2021-01-05 DIAGNOSIS — D62 Acute posthemorrhagic anemia: Secondary | ICD-10-CM

## 2021-01-05 LAB — SURGICAL PATHOLOGY

## 2021-01-05 LAB — PREPARE RBC (CROSSMATCH)

## 2021-01-05 NOTE — Telephone Encounter (Signed)
I would tell him that after he see's Dr. Grayland Ormond he will have access to results and his plan explaining all of this.  Faythe Casa, NP 01/05/2021 10:30 AM

## 2021-01-05 NOTE — Telephone Encounter (Signed)
Call returned to patient and discussed his request and advised of what NP had said, I told him that we cannot write a note to excuse his wife from jury duty and that we do not even do that for patients, but he can take documentation he will have access to and she can take to clerk of court who can then determine if she can be excused. He thanked me for calling him back

## 2021-01-05 NOTE — Telephone Encounter (Signed)
Patient called stating that he has an upcoming appointment (01/12/21) and is requesting that Dr Grayland Ormond write a letter to his wife explaining exactly what is going on with him and also to get her excused from her 4 week jury duty.

## 2021-01-06 LAB — TYPE AND SCREEN
ABO/RH(D): O POS
Antibody Screen: POSITIVE
DAT, IgG: POSITIVE
DAT, complement: POSITIVE
Unit division: 0

## 2021-01-06 LAB — BPAM RBC
Blood Product Expiration Date: 202212201830
ISSUE DATE / TIME: 202212200023
Unit Type and Rh: 5100

## 2021-01-07 NOTE — Progress Notes (Signed)
BMB results.   Faythe Casa, NP 01/07/2021 10:30 AM

## 2021-01-10 DIAGNOSIS — D72819 Decreased white blood cell count, unspecified: Secondary | ICD-10-CM | POA: Insufficient documentation

## 2021-01-10 NOTE — Progress Notes (Signed)
[place]  [phone] [phone]  ID: Patrick Duncan OB: 1965-05-30  MR#: [id number]  NTI#:[id number]  Patient Care Team: Sofie Hartigan, MD as PCP - General (Family Medicine) Lloyd Huger, MD as Consulting Physician (Oncology)  CHIEF COMPLAINT: IgA deficiency and MDS.  INTERVAL HISTORY: Patient is a 55 year old male who was evaluated by APP as an inpatient several weeks ago returns to clinic today to discuss his bone marrow biopsy results.  He has significant weakness and fatigue, but otherwise feels well.  He has no neurological plaints.  He denies any recent fevers.  He has a fair appetite, but denies weight loss.  He has no chest pain, shortness of breath, cough, or hemoptysis.  He denies any nausea, vomiting, constipation, or diarrhea.  He has no further melena or hematochezia.  He has no urinary complaints.  Patient offers no further specific complaints today.  REVIEW OF SYSTEMS:   Review of Systems  Constitutional:  Positive for malaise/fatigue. Negative for fever and weight loss.  Respiratory: Negative.  Negative for cough, hemoptysis and shortness of breath.   Cardiovascular: Negative.  Negative for chest pain and leg swelling.  Gastrointestinal: Negative.  Negative for abdominal pain, blood in stool and melena.  Genitourinary: Negative.  Negative for hematuria.  Musculoskeletal: Negative.  Negative for back pain.  Skin: Negative.  Negative for rash.  Neurological:  Positive for weakness. Negative for dizziness, focal weakness and headaches.  Psychiatric/Behavioral: Negative.  The patient is not nervous/anxious.    As per HPI. Otherwise, a complete review of systems is negative.  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[place])    Morbid obesity ([place])    Sleep apnea     PAST SURGICAL HISTORY: Past Surgical History:  Procedure Laterality Date   COLONOSCOPY WITH PROPOFOL N/A 07/13/2020   Procedure: COLONOSCOPY WITH PROPOFOL;  Surgeon: Lesly Rubenstein, MD;  Location: [facility] ENDOSCOPY;  Service: Endoscopy;  Laterality: N/A;   COLONOSCOPY WITH PROPOFOL N/A 12/23/2020   Procedure: COLONOSCOPY WITH PROPOFOL;  Surgeon: Lucilla Lame, MD;  Location: [facility] ENDOSCOPY;  Service: Endoscopy;  Laterality: N/A;   ESOPHAGOGASTRODUODENOSCOPY (EGD) WITH PROPOFOL N/A 12/23/2020   Procedure: ESOPHAGOGASTRODUODENOSCOPY (EGD) WITH PROPOFOL;  Surgeon: Lucilla Lame, MD;  Location: [facility] ENDOSCOPY;  Service: Endoscopy;  Laterality: N/A;   GIVENS CAPSULE STUDY N/A 12/29/2020   Procedure: GIVENS CAPSULE STUDY;  Surgeon: Lin Landsman, MD;  Location: [facility] ENDOSCOPY;  Service: Gastroenterology;  Laterality: N/A;   TOOTH EXTRACTION      FAMILY HISTORY: Family History  Problem Relation Age of Onset   Benign prostatic hyperplasia Father    Psoriasis Father    Hypertension Mother    Hyperlipidemia Mother    Diabetes Maternal Grandmother    Diabetes Paternal Grandmother     ADVANCED DIRECTIVES (Y/N):  N  HEALTH MAINTENANCE: Social History   Tobacco Use   Smoking status: Never   Smokeless tobacco: Never  Vaping Use   Vaping Use: Never used  Substance Use Topics   Alcohol use: No   Drug use: No     Colonoscopy:  PAP:  Bone density:  Lipid panel:  Allergies  Allergen Reactions   Ampicillin Diarrhea  Bactrim [Sulfamethoxazole-Trimethoprim] Hives   Cephalexin    Claritin [Loratadine] Other (See Comments)    Irritates throat   Penicillins Rash    Current Outpatient Medications  Medication Sig Dispense Refill   acarbose (PRECOSE) 25 MG tablet Take 25 mg by mouth 3 (three) times daily with meals.     amLODipine (NORVASC) 10 MG tablet Take 10 mg by mouth daily.      dorzolamide-timolol (COSOPT) 22.3-6.8 MG/ML ophthalmic solution Place 1 drop into the right eye 2 (two) times daily.     FARXIGA 10 MG TABS tablet Take 10 mg by mouth daily.     fenofibrate 160 MG tablet Take 160 mg by mouth daily.     ferrous sulfate 325 (65 FE) MG EC tablet Take 1 tablet (325 mg total) by mouth 2 (two) times daily. 60 tablet 3   fexofenadine (ALLEGRA) 180 MG tablet Take 180 mg by mouth daily.     fluticasone (FLONASE) 50 MCG/ACT nasal spray Place 2 sprays into both nostrils daily as needed.     glimepiride (AMARYL) 4 MG tablet Take 4 mg by mouth daily with breakfast.     HIBICLENS 4 % external liquid APPLY TOPICALLY DAILY AS NEEDED. TO WASHLEGS AND GROIN AREA 120 mL 3   Hydrocortisone-Aloe 1 % CREA Apply 1 application topically 2 (two) times daily as needed (itching). To itchy area on legs     ketoconazole (NIZORAL) 2 % cream Apply to rash in groin once a day as needed for flares. 60 g 5   lidocaine (LMX) 4 % cream Apply 1 application topically as needed.     lovastatin (MEVACOR) 10 MG tablet Take 10 mg by mouth daily.     metFORMIN (GLUCOPHAGE-XR) 500 MG 24 hr tablet Take 500-1,000 mg by mouth 2 (two) times daily. Takes 1 tablet with breakfast and 2 tablets with supper     metoprolol tartrate (LOPRESSOR) 50 MG tablet Take 50 mg by mouth 2 (two) times daily.     mupirocin ointment (BACTROBAN) 2 % Apply to boils and ulcers QD PRN flares. 22 g 3   omeprazole (PRILOSEC) 20 MG capsule Take 20 mg by mouth daily.     pioglitazone (ACTOS) 30 MG tablet Take 30 mg by mouth daily.     ramipril (ALTACE) 10 MG capsule Take 10 mg by mouth 2 (two) times daily.     spironolactone (ALDACTONE) 50 MG tablet Take 50 mg by mouth daily.     terazosin (HYTRIN) 10 MG capsule Take 10 mg by mouth daily.      torsemide (DEMADEX) 20 MG tablet Take 20 mg by mouth daily.     traZODone (DESYREL) 150 MG tablet Take 150 mg by mouth at bedtime.     TRESIBA FLEXTOUCH 200 UNIT/ML FlexTouch Pen Inject 30 Units  into the skin in the morning.     trolamine salicylate (ASPERCREME) 10 % cream Apply 1 application topically as needed for muscle pain.     vitamin B-12 (CYANOCOBALAMIN) 1000 MCG tablet Take 1,000 mcg by mouth daily.     No current facility-administered medications for this visit.    OBJECTIVE: Vitals:   01/12/21 0902  BP: (!) 106/42  Pulse: 80  Resp: 16  Temp: (!) 97.2 F (36.2 C)  SpO2: 98%     Body mass index is 50.7 kg/m.    ECOG FS:1 - Symptomatic but completely ambulatory  General: Well-developed, well-nourished, no acute distress.  Sitting in a wheelchair. Eyes: Pink conjunctiva, anicteric sclera. HEENT:  Normocephalic, moist mucous membranes. Lungs: No audible wheezing or coughing. Heart: Regular rate and rhythm. Abdomen: Soft, nontender, no obvious distention. Musculoskeletal: No edema, cyanosis, or clubbing. Neuro: Alert, answering all questions appropriately. Cranial nerves grossly intact. Skin: No rashes or petechiae noted. Psych: Normal affect. Lymphatics: No cervical, calvicular, axillary or inguinal LAD.   LAB RESULTS:  Lab Results  Component Value Date   NA 133 (L) 01/12/2021   K 4.0 01/12/2021   CL 100 01/12/2021   CO2 20 (L) 01/12/2021   GLUCOSE 145 (H) 01/12/2021   BUN 68 (H) 01/12/2021   CREATININE 2.59 (H) 01/12/2021   CALCIUM 8.6 (L) 01/12/2021   PROT 7.0 01/12/2021   ALBUMIN 3.4 (L) 01/12/2021   AST 28 01/12/2021   ALT 22 01/12/2021   ALKPHOS 38 01/12/2021   BILITOT 1.2 01/12/2021   GFRNONAA 29 (L) 01/12/2021   GFRAA >60 10/25/2016    Lab Results  Component Value Date   WBC 3.5 (L) 01/12/2021   NEUTROABS 2.9 01/12/2021   HGB 4.7 (LL) 01/12/2021   HCT 14.2 (LL) 01/12/2021   MCV 100.7 (H) 01/12/2021   PLT 346 01/12/2021     STUDIES: DG Chest 2 View  Result Date: 12/14/2020 CLINICAL DATA:  Short of breath and hyperglycemia EXAM: CHEST - 2 VIEW COMPARISON:  None. FINDINGS: Normal mediastinum and cardiac silhouette. Normal  pulmonary vasculature. No evidence of effusion, infiltrate, or pneumothorax. No acute bony abnormality. IMPRESSION: No acute cardiopulmonary process. Electronically Signed   By: Suzy Bouchard M.D.   On: 12/14/2020 12:20   US Venous Img Upper Uni Right(DVT)  Result Date: 12/28/2020 CLINICAL DATA:  Right arm swelling EXAM: RIGHT UPPER EXTREMITY VENOUS DOPPLER ULTRASOUND TECHNIQUE: Gray-scale sonography with graded compression, as well as color Doppler and duplex ultrasound were performed to evaluate the upper extremity deep venous system from the level of the subclavian vein and including the jugular, axillary, basilic, radial, ulnar and upper cephalic vein. Spectral Doppler was utilized to evaluate flow at rest and with distal augmentation maneuvers. COMPARISON:  None. FINDINGS: Contralateral Subclavian Vein: Respiratory phasicity is normal and symmetric with the symptomatic side. No evidence of thrombus. Normal compressibility. Internal Jugular Vein: No evidence of thrombus. Normal compressibility, respiratory phasicity and response to augmentation. Subclavian Vein: No evidence of thrombus. Normal compressibility, respiratory phasicity and response to augmentation. Axillary Vein: No evidence of thrombus. Normal compressibility, respiratory phasicity and response to augmentation. Cephalic Vein: Occlusive thrombus seen in the left cephalic vein from the shoulder to the antecubital region. Basilic Vein: No evidence of thrombus. Normal compressibility, respiratory phasicity and response to augmentation. Brachial Veins: No evidence of thrombus. Normal compressibility, respiratory phasicity and response to augmentation. Radial Veins: No evidence of thrombus. Normal compressibility, respiratory phasicity and response to augmentation. Ulnar Veins: No evidence of thrombus. Normal compressibility, respiratory phasicity and response to augmentation. Venous Reflux:  None visualized. Other Findings:  None visualized.  IMPRESSION: Occlusive thrombus in the right upper arm cephalic vein from the shoulder to the elbow. Electronically Signed   By: Rolm Baptise M.D.   On: 12/28/2020 22:44   CT BONE MARROW BIOPSY & ASPIRATION  Result Date: 01/03/2021 INDICATION: Anemia EXAM: CT BONE MARROW BIOPSY AND ASPIRATION MEDICATIONS: None. ANESTHESIA/SEDATION: Moderate (conscious) sedation was employed during this procedure. A total of Versed 0.5 mg and Fentanyl 50 mcg was administered intravenously. Moderate Sedation Time: 13 minutes. The patient's level of consciousness and vital signs were monitored continuously by radiology nursing throughout the procedure under my direct supervision. FLUOROSCOPY TIME:  N/a COMPLICATIONS: None immediate. PROCEDURE: Informed written consent was obtained from the patient after a thorough discussion of the procedural risks, benefits and alternatives. All questions were addressed. Maximal Sterile Barrier Technique was utilized including caps, mask, sterile gowns, sterile gloves, sterile drape, hand hygiene and skin antiseptic. A timeout was performed prior to the initiation of the procedure. The patient was placed prone on the CT exam table. Limited CT of the pelvis was performed for planning purposes. Skin entry site was marked, and the overlying skin was prepped and draped in the standard sterile fashion. Local analgesia was obtained with 1% lidocaine. Using CT guidance, an 11 gauge needle was advanced just deep to the cortex of the right posterior ilium. Subsequently, bone marrow aspiration and core biopsy were performed. Specimens were submitted to lab/pathology for handling. Hemostasis was achieved with manual pressure, and a clean dressing was placed. The patient tolerated the procedure well without immediate complication. IMPRESSION: Successful CT-guided bone marrow aspiration and core biopsy of the right posterior ilium. Electronically Signed   By: Albin Felling M.D.   On: 01/03/2021 14:25    ECHOCARDIOGRAM COMPLETE  Result Date: 12/23/2020    ECHOCARDIOGRAM REPORT   Patient Name:   Patrick Duncan Date of Exam: 12/23/2020 Medical Rec #:  [id number]         Height:       67.0 in Accession #:    [id number]        Weight:       349.0 lb Date of Birth:  06-04-1965        BSA:          2.562 m Patient Age:    51 years          BP:           129/44 mmHg Patient Gender: M                 HR:           82 bpm. Exam Location:  [facility] Procedure: 2D Echo, Cardiac Doppler and Color Doppler Indications:     Pericardial Effusion I31.3  History:         Patient has no prior history of Echocardiogram examinations.                  Risk Factors:Diabetes, Hypertension, Dyslipidemia and Sleep                  Apnea.  Sonographer:     Sherrie Sport Referring Phys:  [id number] [facility] AMIN Diagnosing Phys: Ida Rogue MD  Sonographer Comments: Suboptimal apical window and no subcostal window. IMPRESSIONS  1. Left ventricular ejection fraction, by estimation, is 60 to 65%. The left ventricle has normal function. The left ventricle has no regional wall motion abnormalities. There is moderate left ventricular hypertrophy. Left ventricular diastolic parameters are consistent with Grade II diastolic dysfunction (pseudonormalization).  2. Right ventricular systolic function is normal. The right ventricular size is normal. There is normal pulmonary artery systolic pressure. The estimated right ventricular systolic pressure is 29.7 mmHg.  3. Left atrial size was mildly dilated.  4. A small to moderate noncircumferential pericardial effusion is present.No tamponade.  5. The mitral valve is normal in structure. No evidence of mitral valve regurgitation. No evidence of mitral stenosis.  6. The aortic valve was not well visualized. Aortic valve regurgitation is not visualized. No aortic stenosis is present.  7. The inferior vena cava is normal in size with greater  than 50% respiratory variability, suggesting right atrial pressure of  3 mmHg. FINDINGS  Left Ventricle: Left ventricular ejection fraction, by estimation, is 60 to 65%. The left ventricle has normal function. The left ventricle has no regional wall motion abnormalities. The left ventricular internal cavity size was normal in size. There is  moderate left ventricular hypertrophy. Left ventricular diastolic parameters are consistent with Grade II diastolic dysfunction (pseudonormalization). Right Ventricle: The right ventricular size is normal. No increase in right ventricular wall thickness. Right ventricular systolic function is normal. There is normal pulmonary artery systolic pressure. The tricuspid regurgitant velocity is 1.59 m/s, and  with an assumed right atrial pressure of 5 mmHg, the estimated right ventricular systolic pressure is 54.6 mmHg. Left Atrium: Left atrial size was mildly dilated. Right Atrium: Right atrial size was normal in size. Pericardium: A small pericardial effusion is present. There is no evidence of cardiac tamponade. Mitral Valve: The mitral valve is normal in structure. No evidence of mitral valve regurgitation. No evidence of mitral valve stenosis. MV peak gradient, 5.7 mmHg. The mean mitral valve gradient is 3.0 mmHg. Tricuspid Valve: The tricuspid valve is normal in structure. Tricuspid valve regurgitation is mild . No evidence of tricuspid stenosis. Aortic Valve: The aortic valve was not well visualized. Aortic valve regurgitation is not visualized. No aortic stenosis is present. Aortic valve mean gradient measures 4.0 mmHg. Aortic valve peak gradient measures 7.4 mmHg. Aortic valve area, by VTI measures 4.99 cm. Pulmonic Valve: The pulmonic valve was normal in structure. Pulmonic valve regurgitation is not visualized. No evidence of pulmonic stenosis. Aorta: The aortic root is normal in size and structure. Venous: The inferior vena cava is normal in size with greater than 50% respiratory variability, suggesting right atrial pressure of 3 mmHg.  IAS/Shunts: No atrial level shunt detected by color flow Doppler.  LEFT VENTRICLE PLAX 2D LVIDd:         5.70 cm   Diastology LVIDs:         3.50 cm   LV e' medial:    6.64 cm/s LV PW:         1.40 cm   LV E/e' medial:  18.4 LV IVS:        1.20 cm   LV e' lateral:   10.30 cm/s LVOT diam:     2.20 cm   LV E/e' lateral: 11.8 LV SV:         111 LV SV Index:   43 LVOT Area:     3.80 cm  RIGHT VENTRICLE RV Basal diam:  5.00 cm RV S prime:     14.90 cm/s TAPSE (M-mode): 5.2 cm LEFT ATRIUM              Index        RIGHT ATRIUM           Index LA diam:        4.70 cm  1.83 cm/m   RA Area:     46.30 cm LA Vol (A2C):   126.0 ml 49.18 ml/m  RA Volume:   221.00 ml 86.26 ml/m LA Vol (A4C):   105.0 ml 40.98 ml/m LA Biplane Vol: 118.0 ml 46.06 ml/m  AORTIC VALVE                    PULMONIC VALVE AV Area (Vmax):    4.64 cm     PV Vmax:        1.06 m/s AV Area (Vmean):  3.99 cm     PV Vmean:       82.200 cm/s AV Area (VTI):     4.99 cm     PV VTI:         0.204 m AV Vmax:           136.00 cm/s  PV Peak grad:   4.5 mmHg AV Vmean:          92.900 cm/s  PV Mean grad:   3.0 mmHg AV VTI:            0.222 m      RVOT Peak grad: 7 mmHg AV Peak Grad:      7.4 mmHg AV Mean Grad:      4.0 mmHg LVOT Vmax:         166.00 cm/s LVOT Vmean:        97.500 cm/s LVOT VTI:          0.291 m LVOT/AV VTI ratio: 1.31  AORTA Ao Root diam: 3.33 cm MITRAL VALVE                TRICUSPID VALVE MV Area (PHT): 3.33 cm     TR Peak grad:   10.1 mmHg MV Area VTI:   3.76 cm     TR Vmax:        159.00 cm/s MV Peak grad:  5.7 mmHg MV Mean grad:  3.0 mmHg     SHUNTS MV Vmax:       1.19 m/s     Systemic VTI:  0.29 m MV Vmean:      82.0 cm/s    Systemic Diam: 2.20 cm MV Decel Time: 228 msec     Pulmonic VTI:  0.260 m MV E velocity: 122.00 cm/s MV A velocity: 121.00 cm/s MV E/A ratio:  1.01 Ida Rogue MD Electronically signed by Ida Rogue MD Signature Date/Time: 12/23/2020/10:15:34 AM    Final     ASSESSMENT: IgA deficiency and MDS.   PLAN:     IgA deficiency: Patient reports he was diagnosed as a child, but does not say he had repeated infections throughout adulthood.  Recent IgA levels were undetectable with a normal IgM and IgG.  No intervention is needed, but patients with isolated IgA deficiency can have anaphylactoid reactions to blood transfusions.  Will order anti-IgA antibodies for further evaluation. MDS: Bone marrow biopsy on January 03, 2021 revealed a hypercellular marrow with erythroid hyperplasia and dyserythropoiesis.  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  No blasts, increased plasma cells, or clonality was noted.  Cytogenetics are pending at time of dictation. Severe anemia: Patient's hemoglobin noted to be 4.7 today.  Because of his IgA deficiency he needs to matched and washed packed red blood cells.  Patient was sent to the emergency room for treatment and evaluation. Acute on chronic renal insufficiency: Likely secondary to severe anemia.  Emergency room as above. Disposition: ED as above.  A referral has also been sent to [facility] given the complicated nature of patient's underlying pathology.  Patient expressed understanding and was in agreement with this plan. He also understands that He can call clinic at any time with any questions, concerns, or complaints.    Lloyd Huger, MD   01/12/2021 10:49 AM

## 2021-01-12 ENCOUNTER — Encounter (HOSPITAL_COMMUNITY): Payer: Self-pay | Admitting: Oncology

## 2021-01-12 ENCOUNTER — Encounter: Payer: Self-pay | Admitting: Emergency Medicine

## 2021-01-12 ENCOUNTER — Other Ambulatory Visit: Payer: Self-pay

## 2021-01-12 ENCOUNTER — Inpatient Hospital Stay: Payer: Medicare HMO

## 2021-01-12 ENCOUNTER — Inpatient Hospital Stay: Payer: Medicare HMO | Attending: Oncology | Admitting: Oncology

## 2021-01-12 ENCOUNTER — Inpatient Hospital Stay
Admission: EM | Admit: 2021-01-12 | Discharge: 2021-01-16 | DRG: 811 | Disposition: A | Discharge status: Home / Self Care | Payer: Medicare HMO | Source: Ambulatory Visit | Attending: Internal Medicine | Admitting: Internal Medicine

## 2021-01-12 ENCOUNTER — Encounter: Payer: Self-pay | Admitting: Oncology

## 2021-01-12 VITALS — BP 106/42 | HR 80 | Temp 97.2°F | Resp 16 | Wt 323.7 lb

## 2021-01-12 DIAGNOSIS — N179 Acute kidney failure, unspecified: Secondary | ICD-10-CM

## 2021-01-12 DIAGNOSIS — Z8719 Personal history of other diseases of the digestive system: Secondary | ICD-10-CM

## 2021-01-12 DIAGNOSIS — G4733 Obstructive sleep apnea (adult) (pediatric): Secondary | ICD-10-CM | POA: Diagnosis present

## 2021-01-12 DIAGNOSIS — I872 Venous insufficiency (chronic) (peripheral): Secondary | ICD-10-CM | POA: Diagnosis present

## 2021-01-12 DIAGNOSIS — E119 Type 2 diabetes mellitus without complications: Secondary | ICD-10-CM | POA: Insufficient documentation

## 2021-01-12 DIAGNOSIS — U071 COVID-19: Secondary | ICD-10-CM | POA: Diagnosis present

## 2021-01-12 DIAGNOSIS — Z7984 Long term (current) use of oral hypoglycemic drugs: Secondary | ICD-10-CM | POA: Diagnosis not present

## 2021-01-12 DIAGNOSIS — Z794 Long term (current) use of insulin: Secondary | ICD-10-CM | POA: Diagnosis not present

## 2021-01-12 DIAGNOSIS — D802 Selective deficiency of immunoglobulin A [IgA]: Secondary | ICD-10-CM | POA: Insufficient documentation

## 2021-01-12 DIAGNOSIS — D630 Anemia in neoplastic disease: Secondary | ICD-10-CM | POA: Diagnosis present

## 2021-01-12 DIAGNOSIS — D72819 Decreased white blood cell count, unspecified: Secondary | ICD-10-CM

## 2021-01-12 DIAGNOSIS — D649 Anemia, unspecified: Secondary | ICD-10-CM | POA: Diagnosis present

## 2021-01-12 DIAGNOSIS — E785 Hyperlipidemia, unspecified: Secondary | ICD-10-CM | POA: Diagnosis present

## 2021-01-12 DIAGNOSIS — D469 Myelodysplastic syndrome, unspecified: Principal | ICD-10-CM

## 2021-01-12 DIAGNOSIS — R0602 Shortness of breath: Secondary | ICD-10-CM

## 2021-01-12 DIAGNOSIS — D62 Acute posthemorrhagic anemia: Secondary | ICD-10-CM

## 2021-01-12 DIAGNOSIS — E782 Mixed hyperlipidemia: Secondary | ICD-10-CM

## 2021-01-12 DIAGNOSIS — I10 Essential (primary) hypertension: Secondary | ICD-10-CM | POA: Insufficient documentation

## 2021-01-12 DIAGNOSIS — N189 Chronic kidney disease, unspecified: Secondary | ICD-10-CM | POA: Insufficient documentation

## 2021-01-12 DIAGNOSIS — Z8249 Family history of ischemic heart disease and other diseases of the circulatory system: Secondary | ICD-10-CM

## 2021-01-12 DIAGNOSIS — Z79899 Other long term (current) drug therapy: Secondary | ICD-10-CM | POA: Insufficient documentation

## 2021-01-12 DIAGNOSIS — E871 Hypo-osmolality and hyponatremia: Secondary | ICD-10-CM | POA: Diagnosis present

## 2021-01-12 DIAGNOSIS — Z833 Family history of diabetes mellitus: Secondary | ICD-10-CM

## 2021-01-12 DIAGNOSIS — E1159 Type 2 diabetes mellitus with other circulatory complications: Secondary | ICD-10-CM

## 2021-01-12 DIAGNOSIS — I5189 Other ill-defined heart diseases: Secondary | ICD-10-CM

## 2021-01-12 DIAGNOSIS — Z6841 Body Mass Index (BMI) 40.0 and over, adult: Secondary | ICD-10-CM | POA: Diagnosis not present

## 2021-01-12 LAB — COMPREHENSIVE METABOLIC PANEL
ALT: 22 U/L (ref 0–44)
ALT: 23 U/L (ref 0–44)
AST: 28 U/L (ref 15–41)
AST: 25 U/L (ref 15–41)
Albumin: 3.4 g/dL — ABNORMAL LOW (ref 3.5–5.0)
Albumin: 3.5 g/dL (ref 3.5–5.0)
Alkaline Phosphatase: 38 U/L (ref 38–126)
Alkaline Phosphatase: 39 U/L (ref 38–126)
Anion gap: 13 (ref 5–15)
Anion gap: 10 (ref 5–15)
BUN: 68 mg/dL — ABNORMAL HIGH (ref 6–20)
BUN: 67 mg/dL — ABNORMAL HIGH (ref 6–20)
CO2: 20 mmol/L — ABNORMAL LOW (ref 22–32)
CO2: 23 mmol/L (ref 22–32)
Calcium: 8.6 mg/dL — ABNORMAL LOW (ref 8.9–10.3)
Calcium: 8.8 mg/dL — ABNORMAL LOW (ref 8.9–10.3)
Chloride: 100 mmol/L (ref 98–111)
Chloride: 102 mmol/L (ref 98–111)
Creatinine, Ser: 2.59 mg/dL — ABNORMAL HIGH (ref 0.61–1.24)
Creatinine, Ser: 2.43 mg/dL — ABNORMAL HIGH (ref 0.61–1.24)
GFR, Estimated: 29 mL/min — ABNORMAL LOW (ref >60)
GFR, Estimated: 31 mL/min — ABNORMAL LOW (ref >60)
Glucose, Bld: 145 mg/dL — ABNORMAL HIGH (ref 70–99)
Glucose, Bld: 110 mg/dL — ABNORMAL HIGH (ref 70–99)
Potassium: 4 mmol/L (ref 3.5–5.1)
Potassium: 4.2 mmol/L (ref 3.5–5.1)
Sodium: 133 mmol/L — ABNORMAL LOW (ref 135–145)
Sodium: 135 mmol/L (ref 135–145)
Total Bilirubin: 1.2 mg/dL (ref 0.3–1.2)
Total Bilirubin: 1.2 mg/dL (ref 0.3–1.2)
Total Protein: 7 g/dL (ref 6.5–8.1)
Total Protein: 6.9 g/dL (ref 6.5–8.1)

## 2021-01-12 LAB — CBC WITH DIFFERENTIAL/PLATELET
Abs Immature Granulocytes: 0.05 10*3/uL (ref 0.00–0.07)
Abs Immature Granulocytes: 0.04 10*3/uL (ref 0.00–0.07)
Basophils Absolute: 0 10*3/uL (ref 0.0–0.1)
Basophils Absolute: 0 10*3/uL (ref 0.0–0.1)
Basophils Relative: 0 %
Basophils Relative: 0 %
Eosinophils Absolute: 0 10*3/uL (ref 0.0–0.5)
Eosinophils Absolute: 0 10*3/uL (ref 0.0–0.5)
Eosinophils Relative: 0 %
Eosinophils Relative: 0 %
HCT: 14.2 % — CL (ref 39.0–52.0)
HCT: 15.8 % — ABNORMAL LOW (ref 39.0–52.0)
Hemoglobin: 4.7 g/dL — CL (ref 13.0–17.0)
Hemoglobin: 5.4 g/dL — ABNORMAL LOW (ref 13.0–17.0)
Immature Granulocytes: 1 %
Immature Granulocytes: 1 %
Lymphocytes Relative: 4 %
Lymphocytes Relative: 6 %
Lymphs Abs: 0.1 10*3/uL — ABNORMAL LOW (ref 0.7–4.0)
Lymphs Abs: 0.2 10*3/uL — ABNORMAL LOW (ref 0.7–4.0)
MCH: 33.3 pg (ref 26.0–34.0)
MCH: 35.1 pg — ABNORMAL HIGH (ref 26.0–34.0)
MCHC: 33.1 g/dL (ref 30.0–36.0)
MCHC: 34.2 g/dL (ref 30.0–36.0)
MCV: 100.7 fL — ABNORMAL HIGH (ref 80.0–100.0)
MCV: 102.6 fL — ABNORMAL HIGH (ref 80.0–100.0)
Monocytes Absolute: 0.4 10*3/uL (ref 0.1–1.0)
Monocytes Absolute: 0.3 10*3/uL (ref 0.1–1.0)
Monocytes Relative: 11 %
Monocytes Relative: 11 %
Neutro Abs: 2.9 10*3/uL (ref 1.7–7.7)
Neutro Abs: 2.4 10*3/uL (ref 1.7–7.7)
Neutrophils Relative %: 84 %
Neutrophils Relative %: 82 %
Platelets: 346 10*3/uL (ref 150–400)
Platelets: 397 10*3/uL (ref 150–400)
RBC: 1.41 MIL/uL — ABNORMAL LOW (ref 4.22–5.81)
RBC: 1.54 MIL/uL — ABNORMAL LOW (ref 4.22–5.81)
RDW: 19.3 % — ABNORMAL HIGH (ref 11.5–15.5)
RDW: 20 % — ABNORMAL HIGH (ref 11.5–15.5)
WBC: 3.5 10*3/uL — ABNORMAL LOW (ref 4.0–10.5)
WBC: 2.9 10*3/uL — ABNORMAL LOW (ref 4.0–10.5)
nRBC: 0 % (ref 0.0–0.2)
nRBC: 0 % (ref 0.0–0.2)

## 2021-01-12 LAB — FERRITIN: Ferritin: 288 ng/mL (ref 24–336)

## 2021-01-12 LAB — PROTIME-INR
INR: 1.1 (ref 0.8–1.2)
Prothrombin Time: 14.4 seconds (ref 11.4–15.2)

## 2021-01-12 LAB — RETICULOCYTES
Immature Retic Fract: 25.1 % — ABNORMAL HIGH (ref 2.3–15.9)
RBC.: 1.59 MIL/uL — ABNORMAL LOW (ref 4.22–5.81)
Retic Count, Absolute: 91.4 10*3/uL (ref 19.0–186.0)
Retic Ct Pct: 5.8 % — ABNORMAL HIGH (ref 0.4–3.1)

## 2021-01-12 LAB — CBG MONITORING, ED: Glucose-Capillary: 128 mg/dL — ABNORMAL HIGH (ref 70–99)

## 2021-01-12 LAB — RESP PANEL BY RT-PCR (FLU A&B, COVID) ARPGX2
Influenza A by PCR: NEGATIVE
Influenza B by PCR: NEGATIVE
SARS Coronavirus 2 by RT PCR: POSITIVE — AB

## 2021-01-12 LAB — IRON AND TIBC
Iron: 104 ug/dL (ref 45–182)
Saturation Ratios: 25 % (ref 17.9–39.5)
TIBC: 414 ug/dL (ref 250–450)
UIBC: 310 ug/dL

## 2021-01-12 LAB — FOLATE: Folate: 11.3 ng/mL (ref >5.9)

## 2021-01-12 LAB — VITAMIN B12
Vitamin B-12: 602 pg/mL (ref 180–914)
Vitamin B-12: 770 pg/mL (ref 180–914)

## 2021-01-12 LAB — BRAIN NATRIURETIC PEPTIDE: B Natriuretic Peptide: 118.4 pg/mL — ABNORMAL HIGH (ref 0.0–100.0)

## 2021-01-12 LAB — LACTATE DEHYDROGENASE: LDH: 129 U/L (ref 98–192)

## 2021-01-12 MED ORDER — INSULIN ASPART 100 UNIT/ML IJ SOLN
6.0000 [IU] | Freq: Three times a day (TID) | INTRAMUSCULAR | Status: DC
  Administered 2021-01-12 – 2021-01-16 (×12): 6 [IU] via SUBCUTANEOUS
  Filled 2021-01-12 (×12): qty 1

## 2021-01-12 MED ORDER — LACTATED RINGERS IV SOLN: INTRAVENOUS | Status: AC

## 2021-01-12 MED ORDER — INSULIN GLARGINE-YFGN 100 UNIT/ML ~~LOC~~ SOLN
32.0000 [IU] | Freq: Every morning | SUBCUTANEOUS | Status: DC
  Administered 2021-01-13 – 2021-01-16 (×4): 32 [IU] via SUBCUTANEOUS
  Filled 2021-01-12 (×5): qty 0.32

## 2021-01-12 MED ORDER — LORATADINE 10 MG PO TABS: 10.0000 mg | ORAL_TABLET | Freq: Every day | ORAL | Status: DC

## 2021-01-12 MED ORDER — GUAIFENESIN-DM 100-10 MG/5ML PO SYRP
10.0000 mL | ORAL_SOLUTION | ORAL | Status: DC | PRN
  Administered 2021-01-13 – 2021-01-16 (×6): 10 mL via ORAL
  Filled 2021-01-12 (×6): qty 10

## 2021-01-12 MED ORDER — DORZOLAMIDE HCL-TIMOLOL MAL 2-0.5 % OP SOLN
1.0000 [drp] | Freq: Two times a day (BID) | OPHTHALMIC | Status: DC
  Administered 2021-01-13 – 2021-01-16 (×8): 1 [drp] via OPHTHALMIC
  Filled 2021-01-12: qty 10

## 2021-01-12 MED ORDER — FENOFIBRATE 160 MG PO TABS
160.0000 mg | ORAL_TABLET | Freq: Every day | ORAL | Status: DC
  Administered 2021-01-12 – 2021-01-16 (×5): 160 mg via ORAL
  Filled 2021-01-12 (×6): qty 1

## 2021-01-12 MED ORDER — AMLODIPINE BESYLATE 10 MG PO TABS
10.0000 mg | ORAL_TABLET | Freq: Every day | ORAL | Status: DC
  Administered 2021-01-14 – 2021-01-16 (×3): 10 mg via ORAL
  Filled 2021-01-12 (×4): qty 1

## 2021-01-12 MED ORDER — PIOGLITAZONE HCL 30 MG PO TABS
30.0000 mg | ORAL_TABLET | Freq: Every day | ORAL | Status: DC
  Administered 2021-01-12: 18:00:00 30 mg via ORAL
  Filled 2021-01-12 (×3): qty 1

## 2021-01-12 MED ORDER — HYDRALAZINE HCL 20 MG/ML IJ SOLN: 10.0000 mg | Freq: Four times a day (QID) | INTRAMUSCULAR | Status: DC | PRN

## 2021-01-12 MED ORDER — ACARBOSE 50 MG PO TABS
25.0000 mg | ORAL_TABLET | Freq: Three times a day (TID) | ORAL | Status: DC
  Filled 2021-01-12 (×6): qty 1

## 2021-01-12 MED ORDER — TRAZODONE HCL 50 MG PO TABS
150.0000 mg | ORAL_TABLET | Freq: Every day | ORAL | Status: DC
  Administered 2021-01-13 – 2021-01-15 (×4): 150 mg via ORAL
  Filled 2021-01-12 (×4): qty 3

## 2021-01-12 MED ORDER — SODIUM CHLORIDE 0.9 % IV SOLN
100.0000 mg | Freq: Every day | INTRAVENOUS | Status: AC
  Administered 2021-01-13 – 2021-01-16 (×4): 100 mg via INTRAVENOUS
  Filled 2021-01-12: qty 100
  Filled 2021-01-12: qty 20
  Filled 2021-01-12 (×2): qty 100
  Filled 2021-01-12: qty 20

## 2021-01-12 MED ORDER — INSULIN ASPART 100 UNIT/ML IJ SOLN: 0.0000 [IU] | Freq: Every day | INTRAMUSCULAR | Status: DC

## 2021-01-12 MED ORDER — TERAZOSIN HCL 5 MG PO CAPS
10.0000 mg | ORAL_CAPSULE | Freq: Every day | ORAL | Status: DC
  Administered 2021-01-12 – 2021-01-16 (×5): 10 mg via ORAL
  Filled 2021-01-12 (×6): qty 2

## 2021-01-12 MED ORDER — SODIUM CHLORIDE 0.9 % IV SOLN: 10.0000 mL/h | Freq: Once | INTRAVENOUS | Status: DC

## 2021-01-12 MED ORDER — FLUTICASONE PROPIONATE 50 MCG/ACT NA SUSP
2.0000 | Freq: Every day | NASAL | Status: DC | PRN
  Administered 2021-01-14: 22:00:00 2 via NASAL
  Filled 2021-01-12 (×2): qty 16

## 2021-01-12 MED ORDER — ALBUTEROL SULFATE HFA 108 (90 BASE) MCG/ACT IN AERS
2.0000 | INHALATION_SPRAY | Freq: Four times a day (QID) | RESPIRATORY_TRACT | Status: DC
  Administered 2021-01-12 – 2021-01-15 (×12): 2 via RESPIRATORY_TRACT
  Filled 2021-01-12: qty 6.7

## 2021-01-12 MED ORDER — SODIUM CHLORIDE 0.9 % IV SOLN
200.0000 mg | Freq: Once | INTRAVENOUS | Status: AC
  Administered 2021-01-12: 15:00:00 200 mg via INTRAVENOUS
  Filled 2021-01-12: qty 200

## 2021-01-12 MED ORDER — ACETAMINOPHEN 325 MG PO TABS: 650.0000 mg | ORAL_TABLET | Freq: Four times a day (QID) | ORAL | Status: DC | PRN

## 2021-01-12 MED ORDER — KETOCONAZOLE 2 % EX CREA
TOPICAL_CREAM | Freq: Two times a day (BID) | CUTANEOUS | Status: DC | PRN
  Filled 2021-01-12: qty 15

## 2021-01-12 MED ORDER — DEXAMETHASONE 4 MG PO TABS
6.0000 mg | ORAL_TABLET | ORAL | Status: DC
  Administered 2021-01-12 – 2021-01-15 (×4): 6 mg via ORAL
  Filled 2021-01-12: qty 2
  Filled 2021-01-12: qty 1
  Filled 2021-01-12 (×2): qty 2
  Filled 2021-01-12: qty 1

## 2021-01-12 MED ORDER — VITAMIN B-12 1000 MCG PO TABS
1000.0000 ug | ORAL_TABLET | Freq: Every day | ORAL | Status: DC
  Administered 2021-01-13 – 2021-01-16 (×4): 1000 ug via ORAL
  Filled 2021-01-12 (×5): qty 1

## 2021-01-12 MED ORDER — PROMETHAZINE HCL 25 MG PO TABS
12.5000 mg | ORAL_TABLET | Freq: Four times a day (QID) | ORAL | Status: DC | PRN
  Filled 2021-01-12: qty 1

## 2021-01-12 MED ORDER — HYDROCORTISONE 1 % EX CREA
1.0000 "application " | TOPICAL_CREAM | Freq: Two times a day (BID) | CUTANEOUS | Status: DC | PRN
  Filled 2021-01-12: qty 28

## 2021-01-12 MED ORDER — DIPHENHYDRAMINE HCL 25 MG PO CAPS
25.0000 mg | ORAL_CAPSULE | Freq: Four times a day (QID) | ORAL | Status: DC | PRN
Start: 2021-01-12 — End: 2021-01-16
  Administered 2021-01-13: 22:00:00 50 mg via ORAL
  Administered 2021-01-13: 01:00:00 25 mg via ORAL
  Filled 2021-01-12: qty 1
  Filled 2021-01-12: qty 2

## 2021-01-12 MED ORDER — POLYETHYLENE GLYCOL 3350 17 G PO PACK: 17.0000 g | PACK | Freq: Every day | ORAL | Status: DC | PRN

## 2021-01-12 MED ORDER — FERROUS SULFATE 325 (65 FE) MG PO TABS
325.0000 mg | ORAL_TABLET | Freq: Two times a day (BID) | ORAL | Status: DC
  Administered 2021-01-12 – 2021-01-16 (×8): 325 mg via ORAL
  Filled 2021-01-12 (×8): qty 1

## 2021-01-12 MED ORDER — SODIUM CHLORIDE 0.9 % IV SOLN: Freq: Once | INTRAVENOUS | Status: DC

## 2021-01-12 MED ORDER — INSULIN ASPART 100 UNIT/ML IJ SOLN
0.0000 [IU] | Freq: Three times a day (TID) | INTRAMUSCULAR | Status: DC
  Administered 2021-01-12: 17:00:00 3 [IU] via SUBCUTANEOUS
  Administered 2021-01-13: 17:00:00 4 [IU] via SUBCUTANEOUS
  Administered 2021-01-14 – 2021-01-15 (×4): 3 [IU] via SUBCUTANEOUS
  Administered 2021-01-15: 17:00:00 4 [IU] via SUBCUTANEOUS
  Administered 2021-01-16: 3 [IU] via SUBCUTANEOUS
  Administered 2021-01-16: 12:00:00 4 [IU] via SUBCUTANEOUS
  Filled 2021-01-12 (×7): qty 1

## 2021-01-12 MED ORDER — BISACODYL 5 MG PO TBEC: 5.0000 mg | DELAYED_RELEASE_TABLET | Freq: Every day | ORAL | Status: DC | PRN

## 2021-01-12 MED ORDER — METOPROLOL TARTRATE 50 MG PO TABS
50.0000 mg | ORAL_TABLET | Freq: Two times a day (BID) | ORAL | Status: DC
  Administered 2021-01-13 – 2021-01-16 (×7): 50 mg via ORAL
  Filled 2021-01-12 (×8): qty 1

## 2021-01-12 MED ORDER — PANTOPRAZOLE SODIUM 40 MG PO TBEC
40.0000 mg | DELAYED_RELEASE_TABLET | Freq: Every day | ORAL | Status: DC
  Administered 2021-01-12 – 2021-01-14 (×3): 40 mg via ORAL
  Filled 2021-01-12 (×3): qty 1

## 2021-01-12 MED ORDER — PRAVASTATIN SODIUM 20 MG PO TABS
10.0000 mg | ORAL_TABLET | Freq: Every day | ORAL | Status: DC
  Administered 2021-01-12 – 2021-01-15 (×4): 10 mg via ORAL
  Filled 2021-01-12 (×4): qty 1

## 2021-01-12 NOTE — ED Triage Notes (Signed)
Presents from [facility] with abnormal albs  hcg 4.7

## 2021-01-12 NOTE — Progress Notes (Signed)
Report called to ER nurse.

## 2021-01-12 NOTE — Assessment & Plan Note (Signed)
remdesivir and dexamethasone along with symptomatic cough control, O2 via [place] as needed, continue home CPAP

## 2021-01-12 NOTE — Assessment & Plan Note (Signed)
Holding nephrotoxic home medications ACE/ARB and diuretics, continue beta-blocker, hydralazine ordered as needed

## 2021-01-12 NOTE — Assessment & Plan Note (Addendum)
Continue home CPAP, okay to leave at bedside, patient needs this when napping

## 2021-01-12 NOTE — Assessment & Plan Note (Signed)
Holding nephrotoxic home medications, placed on sliding scale insulin

## 2021-01-12 NOTE — Assessment & Plan Note (Addendum)
CXR obtained 01/12/21 to eval for COVID pneumonia, personally reviewed images and compared to previous - appears stable from previous though read as CHF, he does have cardiomegaly and mild LE edema. Given COVID now uncertain if truly CHF vs respiratory. Ordered BNP to add to labs. EF on recent echo ok, will not repeat unless clinically worse / extraordinary high BNP.

## 2021-01-12 NOTE — Assessment & Plan Note (Signed)
Following with oncology, consider inpatient consult pending response to therapy

## 2021-01-12 NOTE — ED Provider Notes (Signed)
[facility] Emergency Department Provider Note   ____________________________________________   Event Date/Time   First MD Initiated Contact with Patient 01/12/21 1156     (approximate)  I have reviewed the triage vital signs and the nursing notes.   HISTORY  Chief Complaint Anemia   HPI Patrick Duncan is a 55 y.o. male with past medical history of hypertension, hyperlipidemia, diabetes, IgA deficiency, and myelodysplastic syndrome who presents to the ED complaining of anemia.  Patient reports he was following up with hematology earlier today following recent admission for anemia and found to have hemoglobin of 4.7.  Patient reports that he has been feeling somewhat weak and lightheaded since last night but he denies any recent bleeding.  He had admission for similar symptoms 2 weeks ago, had bone marrow biopsy performed at that time consistent with myelodysplastic syndrome.  He does not take any blood thinners.        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[place])    Morbid obesity ([place])    Sleep apnea     Patient Active Problem List   Diagnosis Date Noted   Leukopenia 01/10/2021   Symptomatic anemia    GI bleed 12/21/2020   Cellulitis 10/22/2016   Diabetes ([place]) 10/22/2016   HTN (hypertension) 10/22/2016   HLD (hyperlipidemia) 10/22/2016   OSA (obstructive sleep apnea) 10/22/2016   AKI (acute kidney injury) ([place]) 10/22/2016    Past Surgical History:  Procedure Laterality Date   COLONOSCOPY WITH PROPOFOL N/A 07/13/2020   Procedure: COLONOSCOPY WITH PROPOFOL;  Surgeon: Lesly Rubenstein, MD;  Location: [facility] ENDOSCOPY;  Service: Endoscopy;  Laterality: N/A;   COLONOSCOPY WITH PROPOFOL N/A 12/23/2020   Procedure: COLONOSCOPY WITH  PROPOFOL;  Surgeon: Lucilla Lame, MD;  Location: [facility] ENDOSCOPY;  Service: Endoscopy;  Laterality: N/A;   ESOPHAGOGASTRODUODENOSCOPY (EGD) WITH PROPOFOL N/A 12/23/2020   Procedure: ESOPHAGOGASTRODUODENOSCOPY (EGD) WITH PROPOFOL;  Surgeon: Lucilla Lame, MD;  Location: [facility] ENDOSCOPY;  Service: Endoscopy;  Laterality: N/A;   GIVENS CAPSULE STUDY N/A 12/29/2020   Procedure: GIVENS CAPSULE STUDY;  Surgeon: Lin Landsman, MD;  Location: [facility] ENDOSCOPY;  Service: Gastroenterology;  Laterality: N/A;   TOOTH EXTRACTION      Prior to Admission medications   Medication Sig Start Date End Date Taking? Authorizing Provider  acarbose (PRECOSE) 25 MG tablet Take 25 mg by mouth 3 (three) times daily with meals.    [provider]  amLODipine (NORVASC) 10 MG tablet Take 10 mg by mouth daily. 05/18/16   [provider]  dorzolamide-timolol (COSOPT) 22.3-6.8 MG/ML ophthalmic solution Place 1 drop into the right eye 2 (two) times daily.    [provider]  FARXIGA 10 MG TABS tablet Take 10 mg by mouth daily. 12/13/20   [provider]  fenofibrate 160 MG tablet Take 160 mg by mouth daily. 07/25/17   [provider]  ferrous sulfate 325 (65 FE) MG EC tablet Take 1 tablet (325 mg total) by mouth 2 (two) times daily. 12/23/20 12/23/21  [name], MD  fexofenadine (ALLEGRA) 180 MG tablet Take 180 mg by mouth daily.    [provider]  fluticasone (FLONASE) 50 MCG/ACT nasal spray Place 2 sprays into both nostrils  daily as needed. 05/04/16   [provider]  glimepiride (AMARYL) 4 MG tablet Take 4 mg by mouth daily with breakfast. 12/24/20   [provider]  HIBICLENS 4 % external liquid APPLY TOPICALLY DAILY AS NEEDED. TO [place] AREA 12/06/20   Ralene Bathe, MD  Hydrocortisone-Aloe 1 % CREA Apply 1 application topically 2 (two) times daily as needed (itching). To itchy area on legs    [provider]  ketoconazole  (NIZORAL) 2 % cream Apply to rash in groin once a day as needed for flares. 09/11/19   Ralene Bathe, MD  lidocaine (LMX) 4 % cream Apply 1 application topically as needed.    [provider]  lovastatin (MEVACOR) 10 MG tablet Take 10 mg by mouth daily. 08/12/19   [provider]  metFORMIN (GLUCOPHAGE-XR) 500 MG 24 hr tablet Take 500-1,000 mg by mouth 2 (two) times daily. Takes 1 tablet with breakfast and 2 tablets with supper 07/31/16   [provider]  metoprolol tartrate (LOPRESSOR) 50 MG tablet Take 50 mg by mouth 2 (two) times daily. 08/23/16   [provider]  mupirocin ointment (BACTROBAN) 2 % Apply to boils and ulcers QD PRN flares. 12/21/20   Ralene Bathe, MD  omeprazole (PRILOSEC) 20 MG capsule Take 20 mg by mouth daily.    [provider]  pioglitazone (ACTOS) 30 MG tablet Take 30 mg by mouth daily. 07/27/16   [provider]  ramipril (ALTACE) 10 MG capsule Take 10 mg by mouth 2 (two) times daily. 05/18/16   [provider]  spironolactone (ALDACTONE) 50 MG tablet Take 50 mg by mouth daily.    [provider]  terazosin (HYTRIN) 10 MG capsule Take 10 mg by mouth daily.  05/18/16   [provider]  torsemide (DEMADEX) 20 MG tablet Take 20 mg by mouth daily.    [provider]  traZODone (DESYREL) 150 MG tablet Take 150 mg by mouth at bedtime. 09/06/16   [provider]  TRESIBA FLEXTOUCH 200 UNIT/ML FlexTouch Pen Inject 30 Units into the skin in the morning. 12/17/20   [provider]  trolamine salicylate (ASPERCREME) 10 % cream Apply 1 application topically as needed for muscle pain.    [provider]  vitamin B-12 (CYANOCOBALAMIN) 1000 MCG tablet Take 1,000 mcg by mouth daily.    [provider]    Allergies Ampicillin, Bactrim [sulfamethoxazole-trimethoprim], Cephalexin, Claritin [loratadine], and Penicillins  Family History  Problem Relation Age of Onset    Benign prostatic hyperplasia Father    Psoriasis Father    Hypertension Mother    Hyperlipidemia Mother    Diabetes Maternal Grandmother    Diabetes Paternal Grandmother     Social History Social History   Tobacco Use   Smoking status: Never   Smokeless tobacco: Never  Vaping Use   Vaping Use: Never used  Substance Use Topics   Alcohol use: No   Drug use: No    Review of Systems  Constitutional: No fever/chills.  Positive for lightheadedness and dizziness. Eyes: No visual changes. ENT: No sore throat. Cardiovascular: Denies chest pain. Respiratory: Denies shortness of breath. Gastrointestinal: No abdominal pain.  No nausea, no vomiting.  No diarrhea.  No constipation. Genitourinary: Negative for dysuria. Musculoskeletal: Negative for back pain. Skin: Negative for rash. Neurological: Negative for headaches, focal weakness or numbness.  ____________________________________________   PHYSICAL EXAM:  VITAL SIGNS: ED Triage Vitals  Enc Vitals Group  BP 01/12/21 1131 (!) 87/57     Pulse Rate 01/12/21 1131 78     Resp 01/12/21 1131 20     Temp 01/12/21 1131 98.3 F (36.8 C)     Temp Source 01/12/21 1131 Oral     SpO2 01/12/21 1131 98 %     Weight --      Height 01/12/21 1108 5' 7"  (1.702 m)     Head Circumference --      Peak Flow --      Pain Score 01/12/21 1108 0     Pain Loc --      Pain Edu? --      Excl. in [place]? --     Constitutional: Alert and oriented. Eyes: Conjunctivae are normal. Head: Atraumatic. Nose: No congestion/rhinnorhea. Mouth/Throat: Mucous membranes are moist. Neck: Normal ROM Cardiovascular: Normal rate, regular rhythm. Grossly normal heart sounds.  2+ radial pulses bilaterally. Respiratory: Normal respiratory effort.  No retractions. Lungs CTAB. Gastrointestinal: Soft and nontender. No distention. Genitourinary: deferred Musculoskeletal: No lower extremity tenderness nor edema. Neurologic:  Normal speech and language. No gross  focal neurologic deficits are appreciated. Skin:  Skin is warm, dry and intact. No rash noted. Psychiatric: Mood and affect are normal. Speech and behavior are normal.  ____________________________________________   LABS (all labs ordered are listed, but only abnormal results are displayed)  Labs Reviewed  CBC WITH DIFFERENTIAL/PLATELET - Abnormal; Notable for the following components:      Result Value   WBC 2.9 (*)    RBC 1.54 (*)    Hemoglobin 5.4 (*)    HCT 15.8 (*)    MCV 102.6 (*)    MCH 35.1 (*)    RDW 20.0 (*)    Lymphs Abs 0.2 (*)    All other components within normal limits  COMPREHENSIVE METABOLIC PANEL - Abnormal; Notable for the following components:   Glucose, Bld 110 (*)    BUN 67 (*)    Creatinine, Ser 2.43 (*)    Calcium 8.8 (*)    GFR, Estimated 31 (*)    All other components within normal limits  RETICULOCYTES - Abnormal; Notable for the following components:   Retic Ct Pct 5.8 (*)    RBC. 1.59 (*)    Immature Retic Fract 25.1 (*)    All other components within normal limits  RESP PANEL BY RT-PCR (FLU A&B, COVID) ARPGX2  PROTIME-INR  TYPE AND SCREEN  PREPARE RBC (CROSSMATCH)     PROCEDURES  Procedure(s) performed (including Critical Care):  .Critical Care Performed by: Blake Divine, MD Authorized by: Blake Divine, MD   Critical care provider statement:    Critical care time (minutes):  30   Critical care time was exclusive of:  Separately billable procedures and treating other patients and teaching time   Critical care was necessary to treat or prevent imminent or life-threatening deterioration of the following conditions:  Circulatory failure   Critical care was time spent personally by me on the following activities:  Development of treatment plan with patient or surrogate, discussions with consultants, evaluation of patient's response to treatment, examination of patient, ordering and review of laboratory studies, ordering and review of  radiographic studies, ordering and performing treatments and interventions, pulse oximetry, re-evaluation of patient's condition and review of old charts   I assumed direction of critical care for this patient from another provider in my specialty: no     Care discussed with: admitting provider     ____________________________________________   INITIAL  IMPRESSION / ASSESSMENT AND PLAN / ED COURSE      55 year old male with past medical history of hypertension, hyperlipidemia, diabetes, IgA deficiency, and myelodysplastic syndrome who presents to the ED complaining of low hemoglobin noted at follow-up with his hematologist.  Patient is symptomatic related to this with recent lightheadedness and dizziness, however he denies any bleeding and has not anticoagulated.  He remains hemodynamically stable, we will crossmatch and transfuse 2 units of PRBCs, which will need to be washed given his IgA deficiency.  Remainder of labs are unremarkable, case discussed with hospitalist for admission to ensure patient's hemoglobin remained stable following transfusion.      ____________________________________________   FINAL CLINICAL IMPRESSION(S) / ED DIAGNOSES  Final diagnoses:  Symptomatic anemia  MDS (myelodysplastic syndrome) [facility])     ED Discharge Orders     None        Note:  This document was prepared using Dragon voice recognition software and may include unintentional dictation errors.    Blake Divine, MD 01/12/21 1336

## 2021-01-12 NOTE — H&P (Addendum)
Patrick Duncan is an 55 y.o. male.   Chief Complaint: abnormal labs, sent from [facility]  HPI/Subjective: Pleasant 55 year old gentleman with past medical history of IgA deficiency, myelodysplastic syndrome.  Presented to ED at the instruction of oncologist for concern for low hemoglobin, need for emergent transfusion given symptoms of fatigue/lightheadedness.  Patient denies syncope.  Also reports mild cough and sinus congestion for the past few days, positive COVID screening in ED.  Patient reports having had COVID vaccines, has not had by valent booster.  Nuys signs or symptoms of bleeding otherwise, reports normal brown stool, states he does not want another colonoscopy or EGD as he has had this work-up already not terribly long ago.  Hospital course: Presented to ED 01/12/2021, hemoglobin 4.7, baseline recently about 7.5-7.9.  Received 2 units blood and improved hemoglobin to 5.4.  Also noted to have AKI on CKD 3, creatinine 2.59, GFR 29-31, typical GFR 30-53 and occasionally above 60.  Additional units ordered and pending at time of admission Admitted 01/12/2021, plan to continue transfusion/monitoring, given IgA deficiency he needs washed RBCs, given AKI needs repeat labs/monitoring.  Given COVID and mild respiratory symptoms, treating for this as well with remdesivir and dexamethasone along with symptomatic cough control.  CXR obtained, personally reviewed - appears stable from previous though read as CHF, he does have cardiomegaly and mild LE edema,. Echo 12/23/20 EF 60-65 and Grade II diastolic dysfunction.. Given COVID now uncertain if truly CHF vs respiratory. Ordered BNP to add to labs. Reviewed recent oncology notes, referral is underway to [facility] presumably to discuss possibility for bone marrow transplant.       Assessment/Plan:  Symptomatic anemia 2/2 to myelodysplastic syndrome complicated by IgA deficiency no overt signs/symptoms of bleeding and patient states that he would decline  a colonoscopy unless absolutely necessary.   Transfusions underway.  Depending on response to therapy, may consider oncology consult versus follow-up outpatient with primary oncologist/possible [facility].   Also monitoring AKI, see below.   Holding pharmacologic VTE prophylaxis.  COVID-19 remdesivir and dexamethasone along with symptomatic cough control, O2 via [place] as needed, continue home CPAP CXR obtained, personally reviewed images and compared to previous - appears stable from previous though read as CHF, he does have cardiomegaly and mild LE edema. Given COVID now uncertain if truly CHF vs respiratory. Ordered BNP to add to labs. Recent echo ok, will not repeat unless clinically worse / extraordinary high BNP  Grade II Diastolic Dysfunction based on recent Echo CXR obtained to eval for COVID pneumonia, personally reviewed images and compared to previous - appears stable from previous though read as CHF, he does have cardiomegaly and mild LE edema. Given COVID now uncertain if truly CHF vs respiratory. Ordered BNP to add to labs. EF on recent echo ok, will not repeat unless clinically worse / extraordinary high BNP.   IgA deficiency ([place]), Myelodysplastic syndrome ([place]), leukopenia Needs washed RBC, following with oncology, consider inpatient consult pending response to therapy  AKI (acute kidney injury) ([place]) Most likely as result of reduced blood volume, hopefully will improve with transfusion and gentle IV fluids hold nephrotoxic medications with ACE/ARB, as needed hydralazine for hypertension, sliding scale insulin for diabetes,  monitor CMP/urine  Venous insufficiency See photograph, venous insufficiency likely as complication of scar tissue due to previous cellulitis, mild itching, skin is not warm, low suspicion for cellulitis but close monitoring.  Topical steroids ordered.    HTN (hypertension) Holding nephrotoxic home medications ACE/ARB and diuretics, continue beta-blocker,  hydralazine  ordered as needed  Diabetes ([place]) Holding nephrotoxic home medications, placed on sliding scale insulin  OSA (obstructive sleep apnea) Continue home CPAP, okay to leave at bedside, patient needs this when napping  HLD (hyperlipidemia) Okay to continue statin, monitor CMP in a.m.   VTE prophylaxis: SCD, holding pharmacologic due to anemia  CODE STATUS FULL CODE -patient would like to fill out an advanced directive while he is here, this was encouraged.  He is married, wife can make decisions, he also notes brother and mother should be involved in decisions if he is unable to speak for himself  Dispo: Depending on response to therapy, hopefully will be able to be discharged back home to self-care with close follow-up with oncology, but if any complications particularly a transfusion reaction, not responding as expected to therapy may consider oncology follow-up/transfer to tertiary care    Past Medical History:  Diagnosis Date   Arthritis    Cellulitis and abscess of left leg    Depression    Diabetes mellitus without complication ([place])    Hearing loss    History of IBS    HLD (hyperlipidemia)    Hypertension    IgA deficiency ([place])    Morbid obesity ([place])    Sleep apnea     Past Surgical History:  Procedure Laterality Date   COLONOSCOPY WITH PROPOFOL N/A 07/13/2020   Procedure: COLONOSCOPY WITH PROPOFOL;  Surgeon: Lesly Rubenstein, MD;  Location: [facility] ENDOSCOPY;  Service: Endoscopy;  Laterality: N/A;   COLONOSCOPY WITH PROPOFOL N/A 12/23/2020   Procedure: COLONOSCOPY WITH PROPOFOL;  Surgeon: Lucilla Lame, MD;  Location[facility] ENDOSCOPY;  Service: Endoscopy;  Laterality: N/A;   ESOPHAGOGASTRODUODENOSCOPY (EGD) WITH PROPOFOL N/A 12/23/2020   Procedure: ESOPHAGOGASTRODUODENOSCOPY (EGD) WITH PROPOFOL;  Surgeon: Lucilla Lame, MD;  Location: [facility] ENDOSCOPY;  Service: Endoscopy;  Laterality: N/A;   GIVENS CAPSULE STUDY N/A 12/29/2020   Procedure: GIVENS CAPSULE STUDY;   Surgeon: Lin Landsman, MD;  Location: [facility] ENDOSCOPY;  Service: Gastroenterology;  Laterality: N/A;   TOOTH EXTRACTION      Family History  Problem Relation Age of Onset   Benign prostatic hyperplasia Father    Psoriasis Father    Hypertension Mother    Hyperlipidemia Mother    Diabetes Maternal Grandmother    Diabetes Paternal Grandmother    Social History:  reports that he has never smoked. He has never used smokeless tobacco. He reports that he does not drink alcohol and does not use drugs.  Allergies:  Allergies  Allergen Reactions   Ampicillin Diarrhea   Bactrim [Sulfamethoxazole-Trimethoprim] Hives   Cephalexin    Claritin [Loratadine] Other (See Comments)    Irritates throat   Penicillins Rash    (Not in a hospital admission)   Results for orders placed or performed during the hospital encounter of 01/12/21 (from the past 48 hour(s))  CBC with Differential     Status: Abnormal   Collection Time: 01/12/21 11:39 AM  Result Value Ref Range   WBC 2.9 (L) 4.0 - 10.5 K/uL   RBC 1.54 (L) 4.22 - 5.81 MIL/uL   Hemoglobin 5.4 (L) 13.0 - 17.0 g/dL   HCT 15.8 (L) 39.0 - 52.0 %   MCV 102.6 (H) 80.0 - 100.0 fL   MCH 35.1 (H) 26.0 - 34.0 pg   MCHC 34.2 30.0 - 36.0 g/dL   RDW 20.0 (H) 11.5 - 15.5 %   Platelets 397 150 - 400 K/uL   nRBC 0.0 0.0 - 0.2 %   Neutrophils  Relative % 82 %   Neutro Abs 2.4 1.7 - 7.7 K/uL   Lymphocytes Relative 6 %   Lymphs Abs 0.2 (L) 0.7 - 4.0 K/uL   Monocytes Relative 11 %   Monocytes Absolute 0.3 0.1 - 1.0 K/uL   Eosinophils Relative 0 %   Eosinophils Absolute 0.0 0.0 - 0.5 K/uL   Basophils Relative 0 %   Basophils Absolute 0.0 0.0 - 0.1 K/uL   Immature Granulocytes 1 %   Abs Immature Granulocytes 0.04 0.00 - 0.07 K/uL    Comment: Performed at [facility], [address] [place], [place] [address]  Comprehensive metabolic panel     Status: Abnormal   Collection Time: 01/12/21 11:39 AM  Result Value Ref Range   Sodium 135  135 - 145 mmol/L   Potassium 4.2 3.5 - 5.1 mmol/L   Chloride 102 98 - 111 mmol/L   CO2 23 22 - 32 mmol/L   Glucose, Bld 110 (H) 70 - 99 mg/dL    Comment: Glucose reference range applies only to samples taken after fasting for at least 8 hours.   BUN 67 (H) 6 - 20 mg/dL   Creatinine, Ser 2.43 (H) 0.61 - 1.24 mg/dL   Calcium 8.8 (L) 8.9 - 10.3 mg/dL   Total Protein 6.9 6.5 - 8.1 g/dL   Albumin 3.5 3.5 - 5.0 g/dL   AST 25 15 - 41 U/L   ALT 23 0 - 44 U/L   Alkaline Phosphatase 39 38 - 126 U/L   Total Bilirubin 1.2 0.3 - 1.2 mg/dL   GFR, Estimated 31 (L) >60 mL/min    Comment: (NOTE) Calculated using the CKD-EPI Creatinine Equation (2021)    Anion gap 10 5 - 15    Comment: Performed at [facility], [address] [place], [place] [address]  Type and screen [place]     Status: None   Collection Time: 01/12/21 11:39 AM  Result Value Ref Range   ABO/RH(D) O POS    Antibody Screen POS    Sample Expiration 01/15/2021,2359    Antibody Identification WARM AUTOANTIBODY    DAT, IgG POS    DAT, complement      POS Performed at [facility], [address] [place], [place] [address]   Reticulocytes     Status: Abnormal   Collection Time: 01/12/21 11:39 AM  Result Value Ref Range   Retic Ct Pct 5.8 (H) 0.4 - 3.1 %   RBC. 1.59 (L) 4.22 - 5.81 MIL/uL   Retic Count, Absolute 91.4 19.0 - 186.0 K/uL   Immature Retic Fract 25.1 (H) 2.3 - 15.9 %    Comment: Performed at [facility], [address], [place], [place] [address]  Resp Panel by RT-PCR (Flu A&B, Covid) Nasopharyngeal Swab     Status: Abnormal   Collection Time: 01/12/21 12:24 PM   Specimen: Nasopharyngeal Swab; Nasopharyngeal(NP) swabs in vial transport medium  Result Value Ref Range   SARS Coronavirus 2 by RT PCR POSITIVE (A) NEGATIVE    Comment: (NOTE) SARS-CoV-2 target nucleic acids are DETECTED.  The SARS-CoV-2 RNA is generally detectable in upper  respiratory specimens during the acute phase of infection. Positive results are indicative of the presence of the identified virus, but do not rule out bacterial infection or co-infection with other pathogens not detected by the test. Clinical correlation with patient history and other diagnostic information is necessary to determine patient infection status. The expected result is Negative.  Fact Sheet for Patients: EntrepreneurPulse.com.au  Fact Sheet for Healthcare Providers: IncredibleEmployment.be  This test is not yet approved or cleared by the [place] FDA and  has been authorized for detection and/or diagnosis of SARS-CoV-2 by FDA under an Emergency Use Authorization (EUA).  This EUA will remain in effect (meaning this test can be used) for the duration of  the COVID-19 declaration under Section 564(b)(1) of the A ct, 21 U.S.C. section 360bbb-3(b)(1), unless the authorization is terminated or revoked sooner.     Influenza A by PCR NEGATIVE NEGATIVE   Influenza B by PCR NEGATIVE NEGATIVE    Comment: (NOTE) The Xpert Xpress SARS-CoV-2/FLU/RSV plus assay is intended as an aid in the diagnosis of influenza from Nasopharyngeal swab specimens and should not be used as a sole basis for treatment. Nasal washings and aspirates are unacceptable for Xpert Xpress SARS-CoV-2/FLU/RSV testing.  Fact Sheet for Patients: EntrepreneurPulse.com.au  Fact Sheet for Healthcare Providers: IncredibleEmployment.be  This test is not yet approved or cleared by the [place] FDA and has been authorized for detection and/or diagnosis of SARS-CoV-2 by FDA under an Emergency Use Authorization (EUA). This EUA will remain in effect (meaning this test can be used) for the duration of the COVID-19 declaration under Section 564(b)(1) of the Act, 21 U.S.C. section 360bbb-3(b)(1), unless the authorization is terminated  or revoked.  Performed at [facility], [address] [place], [place] [address]   Protime-INR     Status: None   Collection Time: 01/12/21 12:24 PM  Result Value Ref Range   Prothrombin Time 14.4 11.4 - 15.2 seconds   INR 1.1 0.8 - 1.2    Comment: (NOTE) INR goal varies based on device and disease states. Performed at [facility], [address] [place], [place] [address]   Prepare RBC (crossmatch)     Status: None (Preliminary result)   Collection Time: 01/12/21 12:28 PM  Result Value Ref Range   Order Confirmation PENDING    No results found.  Review of Systems  Constitutional:  Positive for fatigue. Negative for chills, diaphoresis and fever.  HENT:  Positive for congestion, postnasal drip, rhinorrhea, sinus pressure and sinus pain. Negative for sore throat and trouble swallowing.   Respiratory:  Positive for cough and shortness of breath. Negative for apnea, choking, chest tightness and wheezing.   Cardiovascular:  Positive for leg swelling. Negative for chest pain.  Gastrointestinal:  Negative for abdominal distention, abdominal pain, blood in stool, constipation, diarrhea and nausea.  Genitourinary:  Negative for difficulty urinating.  Musculoskeletal:  Negative for arthralgias.  Skin:  Positive for color change (L lower leg w/ itching (sire of previous cellulitis)).  Neurological:  Positive for light-headedness. Negative for syncope.  Psychiatric/Behavioral:  Negative for agitation and confusion.    Blood pressure (!) 110/33, pulse 84, temperature 98.3 F (36.8 C), temperature source Oral, resp. rate 19, height 5\' 7"  (1.702 m), SpO2 100 %. Physical Exam Constitutional:      Appearance: Normal appearance. He is obese.  HENT:     Head: Normocephalic and atraumatic.     Mouth/Throat:     Mouth: Mucous membranes are moist.  Eyes:     Extraocular Movements: Extraocular movements intact.     Conjunctiva/sclera: Conjunctivae normal.     Pupils:  Pupils are equal, round, and reactive to light.  Cardiovascular:     Rate and Rhythm: Normal rate and regular rhythm.  Pulmonary:     Effort: Pulmonary effort is normal. No respiratory distress.  Breath sounds: Wheezing present.  Abdominal:     General: There is no distension.     Palpations: Abdomen is soft. There is no mass.     Tenderness: There is no abdominal tenderness. There is no guarding or rebound.     Comments: [place] limits exam   Musculoskeletal:        General: No swelling or tenderness.     Cervical back: Normal range of motion and neck supple.  Skin:    General: Skin is warm and dry.     Findings: Erythema (L lower leg - see photo) present.  Neurological:     General: No focal deficit present.     Mental Status: He is alert and oriented to person, place, and time.  Psychiatric:        Mood and Affect: Mood normal.        Behavior: Behavior normal.        Thought Content: Thought content normal.        Judgment: Judgment normal.       Emeterio Reeve, DO 01/12/2021, 2:35 PM

## 2021-01-12 NOTE — Assessment & Plan Note (Signed)
See photograph, venous insufficiency likely as complication of scar tissue due to previous cellulitis, mild itching, skin is not warm, low suspicion for cellulitis but close monitoring.  Topical steroids ordered.

## 2021-01-12 NOTE — Assessment & Plan Note (Signed)
Most likely as result of reduced blood volume, hopefully will improve with transfusion and gentle IV fluids, hold nephrotoxic medications with ACE/ARB, as needed hydralazine for hypertension, sliding scale insulin for diabetes, monitor CMP/urine

## 2021-01-12 NOTE — Assessment & Plan Note (Signed)
Needs washed RBC, following with oncology, consider inpatient consult pending response to therapy

## 2021-01-12 NOTE — Assessment & Plan Note (Signed)
Stable, monitor CBC

## 2021-01-12 NOTE — Progress Notes (Signed)
Remdesivir - Pharmacy Brief Note   O:  ALT: 23 CXR: Borderline mild congestive heart failure SpO2: 100% on RA   A/P:  Remdesivir 200 mg IVPB once followed by 100 mg IVPB daily x 4 days.   Sherilyn Banker, PharmD Clinical Pharmacist  01/12/2021 2:52 PM

## 2021-01-12 NOTE — Assessment & Plan Note (Signed)
Okay to continue statin, monitor CMP in a.m.

## 2021-01-12 NOTE — Assessment & Plan Note (Signed)
Secondary to myelodysplastic syndrome complicated by IgA deficiency, no overt signs/symptoms of bleeding and patient states that he would decline a colonoscopy unless absolutely necessary.  Transfusions underway.  Depending on response to therapy, may consider oncology consult versus follow-up outpatient with primary oncologist/possible [facility].  Also monitoring AKI, see below.  Holding pharmacologic VTE prophylaxis.

## 2021-01-13 DIAGNOSIS — D469 Myelodysplastic syndrome, unspecified: Secondary | ICD-10-CM

## 2021-01-13 DIAGNOSIS — N179 Acute kidney failure, unspecified: Secondary | ICD-10-CM | POA: Diagnosis not present

## 2021-01-13 DIAGNOSIS — I10 Essential (primary) hypertension: Secondary | ICD-10-CM | POA: Diagnosis not present

## 2021-01-13 DIAGNOSIS — D649 Anemia, unspecified: Secondary | ICD-10-CM

## 2021-01-13 DIAGNOSIS — U071 COVID-19: Secondary | ICD-10-CM | POA: Diagnosis not present

## 2021-01-13 LAB — GLUCOSE, CAPILLARY
Glucose-Capillary: 105 mg/dL — ABNORMAL HIGH (ref 70–99)
Glucose-Capillary: 107 mg/dL — ABNORMAL HIGH (ref 70–99)
Glucose-Capillary: 194 mg/dL — ABNORMAL HIGH (ref 70–99)
Glucose-Capillary: 201 mg/dL — ABNORMAL HIGH (ref 70–99)
Glucose-Capillary: 278 mg/dL — ABNORMAL HIGH (ref 70–99)
Glucose-Capillary: 91 mg/dL (ref 70–99)

## 2021-01-13 LAB — CBC WITH DIFFERENTIAL/PLATELET
Abs Immature Granulocytes: 0.04 10*3/uL (ref 0.00–0.07)
Basophils Absolute: 0 10*3/uL (ref 0.0–0.1)
Basophils Relative: 0 %
Eosinophils Absolute: 0 10*3/uL (ref 0.0–0.5)
Eosinophils Relative: 0 %
HCT: 18.7 % — ABNORMAL LOW (ref 39.0–52.0)
Hemoglobin: 6.3 g/dL — ABNORMAL LOW (ref 13.0–17.0)
Immature Granulocytes: 2 %
Lymphocytes Relative: 9 %
Lymphs Abs: 0.2 10*3/uL — ABNORMAL LOW (ref 0.7–4.0)
MCH: 32.8 pg (ref 26.0–34.0)
MCHC: 33.7 g/dL (ref 30.0–36.0)
MCV: 97.4 fL (ref 80.0–100.0)
Monocytes Absolute: 0.3 10*3/uL (ref 0.1–1.0)
Monocytes Relative: 14 %
Neutro Abs: 1.9 10*3/uL (ref 1.7–7.7)
Neutrophils Relative %: 75 %
Platelets: 373 10*3/uL (ref 150–400)
RBC: 1.92 MIL/uL — ABNORMAL LOW (ref 4.22–5.81)
RDW: 20.5 % — ABNORMAL HIGH (ref 11.5–15.5)
WBC: 2.5 10*3/uL — ABNORMAL LOW (ref 4.0–10.5)
nRBC: 0 % (ref 0.0–0.2)

## 2021-01-13 LAB — COMPREHENSIVE METABOLIC PANEL
ALT: 22 U/L (ref 0–44)
AST: 21 U/L (ref 15–41)
Albumin: 3.5 g/dL (ref 3.5–5.0)
Alkaline Phosphatase: 34 U/L — ABNORMAL LOW (ref 38–126)
Anion gap: 7 (ref 5–15)
BUN: 47 mg/dL — ABNORMAL HIGH (ref 6–20)
CO2: 23 mmol/L (ref 22–32)
Calcium: 8.3 mg/dL — ABNORMAL LOW (ref 8.9–10.3)
Chloride: 103 mmol/L (ref 98–111)
Creatinine, Ser: 1.46 mg/dL — ABNORMAL HIGH (ref 0.61–1.24)
GFR, Estimated: 57 mL/min — ABNORMAL LOW (ref >60)
Glucose, Bld: 137 mg/dL — ABNORMAL HIGH (ref 70–99)
Potassium: 3.6 mmol/L (ref 3.5–5.1)
Sodium: 133 mmol/L — ABNORMAL LOW (ref 135–145)
Total Bilirubin: 1.2 mg/dL (ref 0.3–1.2)
Total Protein: 6.9 g/dL (ref 6.5–8.1)

## 2021-01-13 LAB — PREPARE RBC (CROSSMATCH)

## 2021-01-13 MED ORDER — SODIUM CHLORIDE 0.9% IV SOLUTION: Freq: Once | INTRAVENOUS | Status: AC

## 2021-01-13 NOTE — Progress Notes (Signed)
PROGRESS NOTE  Patrick Duncan    DOB: 02-Nov-1965, 55 y.o.  YIR:[id number]  PCP: Sofie Hartigan, MD   Code Status: Full Code   DOA: 01/12/2021   LOS: 1  Brief Narrative of Current Hospitalization  Patrick Duncan is a 55 y.o. male with a PMH significant for IgA deficiency, myelodysplastic syndrome, HTN, type II DM, OSA, AKI. They presented from home to the ED on 01/12/2021 with symptomatic anemia which was detected at outpatient heme/onc appointment with a hgb 4.7. In the ED, they received 2 units pRBCs which improved hgb to 5.4. He endorsed improvement in his symptoms overall Patient was admitted to medicine service for further workup and management of symptomatic anemia as outlined in detail below.  01/13/21 -stable  Assessment & Plan  Principal Problem:   Symptomatic anemia Active Problems:   Diabetes (HCC)   HTN (hypertension)   OSA (obstructive sleep apnea)   AKI (acute kidney injury) (HCC)   Leukopenia   IgA deficiency (HCC)   Myelodysplastic syndrome (HCC)   COVID-19   Venous insufficiency   Diastolic dysfunction without heart failure  Symptomatic anemia in setting of myelodysplastic syndrome- hgb has gradually been improving with transfusions. S/p 2units washed RBCs. Hgb 4.7>5.4>6.3 - transfuse 2 additional units washed RBCs - heme/onc contacted, appreciate recs - will need OP follow up - post transfusion CBC  IgA deficiency- outpatient workup in place.  COVID-19 +. Presenting symptoms of generalized fatigue likely in part due to infection but patient is hemodynamically stable ORA - continue remdesivir - supportive care PRN  Mild hyponatremia- Na 133 - monitor  AKI- improved. Likely as result of poor perfusion but improved with transfusion. Cr 2.59>1.46 - BMP am  DVT prophylaxis: SCDs Start: 01/12/21 1410   Diet:  Diet Orders (From admission, onward)     Start     Ordered   01/12/21 1409  Diet Carb Modified Fluid consistency: Thin; Room service  appropriate? Yes  Diet effective now       Question Answer Comment  Diet-HS Snack? Nothing   Calorie Level Medium 1600-2000   Fluid consistency: Thin   Room service appropriate? Yes      01/12/21 1413            Subjective 01/13/21    Pt reports feeling much improved. Concerns about needing to continuously come to hospital for transfusion due to financial constraints.   Disposition Plan & Communication  Patient status: Inpatient  Admitted From: Home Disposition: Home Anticipated discharge date: 12/30  Family Communication: none  Consults, Procedures, Significant Events  Consultants:  Heme/onc  Procedures/significant events:  Blood transfusion x4 units pRBCs  Antimicrobials:  Anti-infectives (From admission, onward)    Start     Dose/Rate Route Frequency Ordered Stop   01/13/21 1000  remdesivir 100 mg in sodium chloride 0.9 % 100 mL IVPB       See Hyperspace for full Linked Orders Report.   100 mg 200 mL/hr over 30 Minutes Intravenous Daily 01/12/21 1413 01/17/21 0959   01/12/21 1500  remdesivir 200 mg in sodium chloride 0.9% 250 mL IVPB       See Hyperspace for full Linked Orders Report.   200 mg 580 mL/hr over 30 Minutes Intravenous Once 01/12/21 1413 01/12/21 1513       Objective   Vitals:   01/13/21 0456 01/13/21 0535 01/13/21 0630 01/13/21 0740  BP: (!) 109/45 (!) 102/46 (!) 105/50 (!) 118/50  Pulse: 75 73 75 78  Resp: 20 20  20 18  Temp: 97.7 F (36.5 C) 98 F (36.7 C) 98.2 F (36.8 C) 97.8 F (36.6 C)  TempSrc:  Oral Oral Oral  SpO2: 95% 95% 95% 99%  Weight:      Height:        Intake/Output Summary (Last 24 hours) at 01/13/2021 0743 Last data filed at 01/13/2021 0640 Gross per 24 hour  Intake 1470.57 ml  Output --  Net 1470.57 ml   Filed Weights   01/13/21 0048  Weight: (!) 146.5 kg    Patient BMI: Body mass index is 50.59 kg/m.   Physical Exam:  General: awake, alert, NAD HEENT: atraumatic, clear conjunctiva, anicteric sclera,  MMM, hearing grossly normal Respiratory: normal respiratory effort. Cardiovascular: normal S1/S2, RRR, no JVD, murmurs, quick capillary refill  Nervous: A&O x3. no gross focal neurologic deficits, normal speech Extremities: moves all equally, trace lower extremity edema, normal tone Skin: dry, intact, normal temperature, normal color. No rashes, lesions or ulcers on exposed skin Psychiatry: normal mood, congruent affect  Labs   I have personally reviewed following labs and imaging studies Admission on 01/12/2021  Component Date Value Ref Range Status   WBC 01/12/2021 2.9 (L)  4.0 - 10.5 K/uL Final   RBC 01/12/2021 1.54 (L)  4.22 - 5.81 MIL/uL Final   Hemoglobin 01/12/2021 5.4 (L)  13.0 - 17.0 g/dL Final   HCT 01/12/2021 15.8 (L)  39.0 - 52.0 % Final   MCV 01/12/2021 102.6 (H)  80.0 - 100.0 fL Final   MCH 01/12/2021 35.1 (H)  26.0 - 34.0 pg Final   MCHC 01/12/2021 34.2  30.0 - 36.0 g/dL Final   RDW 01/12/2021 20.0 (H)  11.5 - 15.5 % Final   Platelets 01/12/2021 397  150 - 400 K/uL Final   nRBC 01/12/2021 0.0  0.0 - 0.2 % Final   Neutrophils Relative % 01/12/2021 82  % Final   Neutro Abs 01/12/2021 2.4  1.7 - 7.7 K/uL Final   Lymphocytes Relative 01/12/2021 6  % Final   Lymphs Abs 01/12/2021 0.2 (L)  0.7 - 4.0 K/uL Final   Monocytes Relative 01/12/2021 11  % Final   Monocytes Absolute 01/12/2021 0.3  0.1 - 1.0 K/uL Final   Eosinophils Relative 01/12/2021 0  % Final   Eosinophils Absolute 01/12/2021 0.0  0.0 - 0.5 K/uL Final   Basophils Relative 01/12/2021 0  % Final   Basophils Absolute 01/12/2021 0.0  0.0 - 0.1 K/uL Final   Immature Granulocytes 01/12/2021 1  % Final   Abs Immature Granulocytes 01/12/2021 0.04  0.00 - 0.07 K/uL Final   Sodium 01/12/2021 135  135 - 145 mmol/L Final   Potassium 01/12/2021 4.2  3.5 - 5.1 mmol/L Final   Chloride 01/12/2021 102  98 - 111 mmol/L Final   CO2 01/12/2021 23  22 - 32 mmol/L Final   Glucose, Bld 01/12/2021 110 (H)  70 - 99 mg/dL Final    BUN 01/12/2021 67 (H)  6 - 20 mg/dL Final   Creatinine, Ser 01/12/2021 2.43 (H)  0.61 - 1.24 mg/dL Final   Calcium 01/12/2021 8.8 (L)  8.9 - 10.3 mg/dL Final   Total Protein 01/12/2021 6.9  6.5 - 8.1 g/dL Final   Albumin 01/12/2021 3.5  3.5 - 5.0 g/dL Final   AST 01/12/2021 25  15 - 41 U/L Final   ALT 01/12/2021 23  0 - 44 U/L Final   Alkaline Phosphatase 01/12/2021 39  38 - 126 U/L Final   Total Bilirubin  01/12/2021 1.2  0.3 - 1.2 mg/dL Final   GFR, Estimated 01/12/2021 31 (L)  >60 mL/min Final   Anion gap 01/12/2021 10  5 - 15 Final   ABO/RH(D) 01/12/2021 O POS   Final   Antibody Screen 01/12/2021 POS   Final   Sample Expiration 01/12/2021 01/15/2021,2359   Final   Antibody Identification 96/04/5407 WARM AUTOANTIBODY   Final   DAT, IgG 01/12/2021 POS   Final   DAT, complement 01/12/2021 POS   Final   Unit Number 01/12/2021 [id number]   Final   Blood Component Type 01/12/2021 RBC,WASH IRR   Final   Unit division 01/12/2021 00   Final   Status of Unit 01/12/2021 ISSUED   Final   Transfusion Status 01/12/2021 OK TO TRANSFUSE   Final   Crossmatch Result 01/12/2021 INCOMPATIBLE   Final   Unit Number 01/12/2021 [id number]   Final   Blood Component Type 01/12/2021 RBC,WASH IRR   Final   Unit division 01/12/2021 00   Final   Status of Unit 01/12/2021 ISSUED   Final   Transfusion Status 01/12/2021 OK TO TRANSFUSE   Final   Crossmatch Result 01/12/2021    Final                   Value:INCOMPATIBLE Performed at [facility], [address] [place], [place] [address]    Retic Ct Pct 01/12/2021 5.8 (H)  0.4 - 3.1 % Final   RBC. 01/12/2021 1.59 (L)  4.22 - 5.81 MIL/uL Final   Retic Count, Absolute 01/12/2021 91.4  19.0 - 186.0 K/uL Final   Immature Retic Fract 01/12/2021 25.1 (H)  2.3 - 15.9 % Final   SARS Coronavirus 2 by RT PCR 01/12/2021 POSITIVE (A)  NEGATIVE Final   Influenza A by PCR 01/12/2021 NEGATIVE  NEGATIVE Final   Influenza B by PCR 01/12/2021 NEGATIVE   NEGATIVE Final   Prothrombin Time 01/12/2021 14.4  11.4 - 15.2 seconds Final   INR 01/12/2021 1.1  0.8 - 1.2 Final   Order Confirmation 01/12/2021    Final                   Value:ORDER PROCESSED BY BLOOD BANK Performed at [facility], [address] [place], [place] [address]    LDH 01/12/2021 129  98 - 192 U/L Final   Vitamin B-12 01/12/2021 770  180 - 914 pg/mL Final   Folate 01/12/2021 11.3  >5.9 ng/mL Final   Glucose-Capillary 01/12/2021 128 (H)  70 - 99 mg/dL Final   B Natriuretic Peptide 01/12/2021 118.4 (H)  0.0 - 100.0 pg/mL Final   ISSUE DATE / TIME 01/12/2021 [id number]   Final   Blood Product Unit Number 01/12/2021 [id number]   Final   PRODUCT CODE 01/12/2021 E5170V00   Final   Unit Type and Rh 01/12/2021 5100   Final   Blood Product Expiration Date 01/12/2021 [id number]   Final   ISSUE DATE / TIME 01/12/2021 166063016010   Final   Blood Product Unit Number 01/12/2021 [id number]   Final   PRODUCT CODE 01/12/2021 E5170V00   Final   Unit Type and Rh 01/12/2021 5100   Final   Blood Product Expiration Date 01/12/2021 [id number]   Final   Glucose-Capillary 01/13/2021 107 (H)  70 - 99 mg/dL Final    Imaging Studies  Portable chest 1 View  Result Date: 01/12/2021 CLINICAL DATA:  Weakness, dyspnea EXAM: PORTABLE CHEST 1 VIEW COMPARISON:  12/14/2020 chest radiograph. FINDINGS: Stable cardiomediastinal  silhouette with mild cardiomegaly. No pneumothorax. No pleural effusion. Borderline mild pulmonary edema. No consolidative airspace disease. IMPRESSION: Borderline mild congestive heart failure. Electronically Signed   By: Ilona Sorrel M.D.   On: 01/12/2021 14:36    Medications   Scheduled Meds:  acarbose  25 mg Oral TID WC   albuterol  2 puff Inhalation Q6H   amLODipine  10 mg Oral Daily   dexamethasone  6 mg Oral Q24H   dorzolamide-timolol  1 drop Right Eye BID   fenofibrate  160 mg Oral Daily   ferrous sulfate  325 mg Oral BID WC   insulin aspart   0-20 Units Subcutaneous TID WC   insulin aspart  0-5 Units Subcutaneous QHS   insulin aspart  6 Units Subcutaneous TID WC   insulin glargine-yfgn  32 Units Subcutaneous q AM   metoprolol tartrate  50 mg Oral BID   pantoprazole  40 mg Oral Daily   pioglitazone  30 mg Oral Daily   pravastatin  10 mg Oral q1800   terazosin  10 mg Oral Daily   traZODone  150 mg Oral QHS   vitamin B-12  1,000 mcg Oral Daily   No recently discontinued medications to reconcile  LOS: 1 day   Richarda Osmond, DO [facility] 01/13/2021, 7:43 AM   Available by Epic secure chat 7AM-7PM. If 7PM-7AM, please contact night-coverage Refer to [email or web address] to contact the [facility] Attending or Consulting provider for this pt

## 2021-01-13 NOTE — Plan of Care (Signed)

## 2021-01-13 NOTE — TOC Initial Note (Signed)
Transition of Care [facility]) - Initial/Assessment Note    Patient Details  Name: Patrick Duncan MRN: [id number] Date of Birth: [birth year]  Transition of Care [facility]) CM/SW Contact:    Candie Chroman, LCSW Phone Number: 01/13/2021, 9:09 AM  Clinical Narrative:  Called patient in the room, introduced role, and explained that discharge planning would be discussed. PCP is Thereasa Distance, MD at [facility] in [place]. Patient was previously driving himself to appointments but since hospitalization, has reported weakness at home so his wife has been transporting him. Pharmacy is [facility] in [place]. No issues obtaining medications other than a mix up with size bottle he was supposed to get for one of his medications recently. He states he will only talk to two of the staff now to avoid further mix ups. No home health prior to admission. Sent secure chat to MD requesting PT and OT consults. Patient has a walker without any wheels and is currently borrowing a two-wheeled walker. No further concerns. CSW encouraged patient to contact CSW as needed. CSW will continue to follow patient for support and facilitate return home when stable.                Expected Discharge Plan: Home/Self Care Barriers to Discharge: Continued Medical Work up   Patient Goals and CMS Choice        Expected Discharge Plan and Services Expected Discharge Plan: Home/Self Care     Post Acute Care Choice:  (TBD) Living arrangements for the past 2 months: Single Family Home                                      Prior Living Arrangements/Services Living arrangements for the past 2 months: Single Family Home Lives with:: Spouse Patient language and need for interpreter reviewed:: Yes Do you feel safe going back to the place where you live?: Yes      Need for Family Participation in Patient Care: Yes (Comment) Care giver support system in place?: Yes (comment) Current home services: DME Criminal  Activity/Legal Involvement Pertinent to Current Situation/Hospitalization: No - Comment as needed  Activities of Daily Living Home Assistive Devices/Equipment: CPAP ADL Screening (condition at time of admission) Patient's cognitive ability adequate to safely complete daily activities?: Yes Is the patient deaf or have difficulty hearing?: No Does the patient have difficulty seeing, even when wearing glasses/contacts?: No Does the patient have difficulty concentrating, remembering, or making decisions?: No Patient able to express need for assistance with ADLs?: Yes Does the patient have difficulty dressing or bathing?: No Independently performs ADLs?: Yes (appropriate for developmental age) Does the patient have difficulty walking or climbing stairs?: No Weakness of Legs: Both Weakness of Arms/Hands: None  Permission Sought/Granted                  Emotional Assessment Appearance:: Appears stated age Attitude/Demeanor/Rapport: Engaged, Gracious Affect (typically observed): Accepting, Appropriate, Calm, Pleasant Orientation: : Oriented to Self, Oriented to Place, Oriented to  Time, Oriented to Situation Alcohol / Substance Use: Not Applicable Psych Involvement: No (comment)  Admission diagnosis:  Shortness of breath [R06.02] MDS (myelodysplastic syndrome) (HCC) [D46.9] Symptomatic anemia [D64.9] Patient Active Problem List   Diagnosis Date Noted   IgA deficiency ([place]) 01/12/2021   Myelodysplastic syndrome ([place]) 01/12/2021   COVID-19 01/12/2021   Venous insufficiency 50/27/7412   Diastolic dysfunction without heart failure 01/12/2021   Leukopenia 01/10/2021  Symptomatic anemia    GI bleed 12/21/2020   Cellulitis 10/22/2016   Diabetes ([place]) 10/22/2016   HTN (hypertension) 10/22/2016   OSA (obstructive sleep apnea) 10/22/2016   AKI (acute kidney injury) ([place]) 10/22/2016   PCP:  Sofie Hartigan, MD Pharmacy:   [facility], [address] [place] [address] Phone: [phone] Fax: [phone]     Social Determinants of Health (SDOH) Interventions    Readmission Risk Interventions Readmission Risk Prevention Plan 01/13/2021  Transportation Screening Complete  PCP or Specialist Appt within 3-5 Days Complete  Social Work Consult for [place] Planning/Counseling [place] Not Applicable  Medication Review Press photographer) Complete  Some recent data might be hidden

## 2021-01-13 NOTE — Consult Note (Signed)
[place]  [phone]) [phone] [phone]  ID: Dorathy Kinsman OB: 1965/06/13  MR#: [id number]  WIO#:[id number]  Patient Care Team: Sofie Hartigan, MD as PCP - General (Family Medicine) Lloyd Huger, MD as Consulting Physician (Oncology)  CHIEF COMPLAINT: IgA deficiency and MDS, COVID-positive  INTERVAL HISTORY: Patient is a 55 year old male with the above-stated diagnosis who was admitted to the hospital after being found to have a hemoglobin of 4.7.  He has IgA deficiency complication transfusions as he can have anaphylactic reactions if red blood cells are not washed and treated.  His hemoglobin has improved to 6.3 with transfusion.  Patient reports he has 2 more units planned for later today.  He does not complain of weakness or fatigue today.  He has a mild cough, but otherwise feels well.  He has no neurologic complaints.  He denies any fevers.  He has a fair appetite, but denies weight loss.  He has no chest pain, shortness of breath, or hemoptysis.  He denies any nausea, vomiting, constipation, or diarrhea.  He has no urinary complaints.  Patient offers no further specific complaints today.  REVIEW OF SYSTEMS:   Review of Systems  Constitutional: Negative.  Negative for fever, malaise/fatigue and weight loss.  Respiratory:  Positive for cough. Negative for hemoptysis and shortness of breath.   Cardiovascular: Negative.  Negative for chest pain and leg swelling.  Gastrointestinal: Negative.  Negative for abdominal pain, blood in stool and melena.  Genitourinary: Negative.  Negative for hematuria.  Musculoskeletal: Negative.  Negative for back pain.  Skin: Negative.  Negative for rash.  Neurological: Negative.  Negative for dizziness, seizures, weakness and headaches.  Psychiatric/Behavioral: Negative.  The patient is not nervous/anxious.    As per HPI. Otherwise, a complete review of systems is negative.  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[place])    Morbid obesity ([place])    Sleep apnea     PAST SURGICAL HISTORY: Past Surgical History:  Procedure Laterality Date   COLONOSCOPY WITH PROPOFOL N/A 07/13/2020   Procedure: COLONOSCOPY WITH PROPOFOL;  Surgeon: Lesly Rubenstein, MD;  Location: [facility] ENDOSCOPY;  Service: Endoscopy;  Laterality: N/A;   COLONOSCOPY WITH PROPOFOL N/A 12/23/2020   Procedure: COLONOSCOPY WITH PROPOFOL;  Surgeon: Lucilla Lame, MD;  Location: [facility] ENDOSCOPY;  Service: Endoscopy;  Laterality: N/A;   ESOPHAGOGASTRODUODENOSCOPY (EGD) WITH PROPOFOL N/A 12/23/2020   Procedure: ESOPHAGOGASTRODUODENOSCOPY (EGD) WITH PROPOFOL;  Surgeon: Lucilla Lame, MD;  Location: [facility] ENDOSCOPY;  Service: Endoscopy;  Laterality: N/A;   GIVENS CAPSULE STUDY N/A 12/29/2020   Procedure: GIVENS CAPSULE STUDY;  Surgeon: Lin Landsman, MD;  Location: [facility] ENDOSCOPY;  Service: Gastroenterology;  Laterality: N/A;   TOOTH EXTRACTION      FAMILY HISTORY: Family History  Problem Relation Age of Onset   Benign prostatic hyperplasia Father    Psoriasis Father    Hypertension Mother    Hyperlipidemia Mother    Diabetes Maternal Grandmother    Diabetes Paternal Grandmother     ADVANCED DIRECTIVES (Y/N):  @ADVDIR @  HEALTH MAINTENANCE: Social History   Tobacco Use   Smoking status: Never   Smokeless tobacco: Never  Vaping Use   Vaping Use: Never  used  Substance Use Topics   Alcohol use: No   Drug use: No     Colonoscopy:  PAP:  Bone density:  Lipid panel:  Allergies  Allergen Reactions   Ampicillin Diarrhea   Bactrim [Sulfamethoxazole-Trimethoprim] Hives   Cephalexin    Claritin [Loratadine] Other (See Comments)    Irritates throat   Penicillins Rash    Current  Facility-Administered Medications  Medication Dose Route Frequency Provider Last Rate Last Admin   0.9 %  sodium chloride infusion (Manually program via Guardrails IV Fluids)   Intravenous Once Doristine Mango L, MD       0.9 %  sodium chloride infusion  10 mL/hr Intravenous Once Blake Divine, MD       0.9 %  sodium chloride infusion   Intravenous Once Emeterio Reeve, DO       acarbose (PRECOSE) tablet 25 mg  25 mg Oral TID WC Emeterio Reeve, DO       acetaminophen (TYLENOL) tablet 650 mg  650 mg Oral Q6H PRN Emeterio Reeve, DO       albuterol (VENTOLIN HFA) 108 (90 Base) MCG/ACT inhaler 2 puff  2 puff Inhalation Q6H Emeterio Reeve, DO   2 puff at 01/13/21 1302   amLODipine (NORVASC) tablet 10 mg  10 mg Oral Daily Emeterio Reeve, DO       bisacodyl (DULCOLAX) EC tablet 5 mg  5 mg Oral Daily PRN Emeterio Reeve, DO       dexamethasone (DECADRON) tablet 6 mg  6 mg Oral Q24H Emeterio Reeve, DO   6 mg at 01/13/21 1253   diphenhydrAMINE (BENADRYL) capsule 25-50 mg  25-50 mg Oral Q6H PRN Emeterio Reeve, DO   25 mg at 01/13/21 0050   dorzolamide-timolol (COSOPT) 22.3-6.8 MG/ML ophthalmic solution 1 drop  1 drop Right Eye BID Emeterio Reeve, DO   1 drop at 01/13/21 0857   fenofibrate tablet 160 mg  160 mg Oral Daily Emeterio Reeve, DO   160 mg at 01/13/21 2947   ferrous sulfate tablet 325 mg  325 mg Oral BID WC Emeterio Reeve, DO   325 mg at 01/13/21 0850   fluticasone (FLONASE) 50 MCG/ACT nasal spray 2 spray  2 spray Each Nare Daily PRN Emeterio Reeve, DO       guaiFENesin-dextromethorphan (ROBITUSSIN DM) 100-10 MG/5ML syrup 10 mL  10 mL Oral Q4H PRN Emeterio Reeve, DO       hydrALAZINE (APRESOLINE) injection 10 mg  10 mg Intravenous Q6H PRN Emeterio Reeve, DO       hydrocortisone cream 1 % 1 application  1 application Topical BID PRN Emeterio Reeve, DO       insulin aspart (novoLOG) injection 0-20 Units  0-20 Units Subcutaneous TID WC  Emeterio Reeve, DO   3 Units at 01/12/21 1649   insulin aspart (novoLOG) injection 0-5 Units  0-5 Units Subcutaneous QHS Emeterio Reeve, DO       insulin aspart (novoLOG) injection 6 Units  6 Units Subcutaneous TID WC Emeterio Reeve, DO   6 Units at 01/13/21 1252   insulin glargine-yfgn (SEMGLEE) injection 32 Units  32 Units Subcutaneous q AM Emeterio Reeve, DO   32 Units at 01/13/21 0943   ketoconazole (NIZORAL) 2 % cream   Topical BID PRN Emeterio Reeve, DO       metoprolol tartrate (LOPRESSOR) tablet 50 mg  50 mg Oral BID Emeterio Reeve, DO   50 mg at 01/13/21 0101   pantoprazole (PROTONIX) EC tablet 40 mg  40 mg  Oral Daily Emeterio Reeve, DO   40 mg at 01/13/21 0849   pioglitazone (ACTOS) tablet 30 mg  30 mg Oral Daily Emeterio Reeve, DO   30 mg at 01/12/21 1826   polyethylene glycol (MIRALAX / GLYCOLAX) packet 17 g  17 g Oral Daily PRN Emeterio Reeve, DO       pravastatin (PRAVACHOL) tablet 10 mg  10 mg Oral q1800 Emeterio Reeve, DO   10 mg at 01/12/21 1750   promethazine (PHENERGAN) tablet 12.5 mg  12.5 mg Oral Q6H PRN Emeterio Reeve, DO       remdesivir 100 mg in sodium chloride 0.9 % 100 mL IVPB  100 mg Intravenous Daily Emeterio Reeve, DO 200 mL/hr at 01/13/21 1302 100 mg at 01/13/21 1302   terazosin (HYTRIN) capsule 10 mg  10 mg Oral Daily Emeterio Reeve, DO   10 mg at 01/13/21 0854   traZODone (DESYREL) tablet 150 mg  150 mg Oral QHS Emeterio Reeve, DO   150 mg at 01/13/21 0101   vitamin B-12 (CYANOCOBALAMIN) tablet 1,000 mcg  1,000 mcg Oral Daily Emeterio Reeve, DO   1,000 mcg at 01/13/21 0850    OBJECTIVE: Vitals:   01/13/21 0740 01/13/21 1130  BP: (!) 118/50 (!) 115/47  Pulse: 78 85  Resp: 18 20  Temp: 97.8 F (36.6 C) 98.6 F (37 C)  SpO2: 99% 98%     Body mass index is 50.59 kg/m.    ECOG FS:1 - Symptomatic but completely ambulatory  General: Well-developed, well-nourished, no acute distress. Eyes: Pink  conjunctiva, anicteric sclera. HEENT: Normocephalic, moist mucous membranes. Lungs: No audible wheezing or coughing. Heart: Regular rate and rhythm. Abdomen: Soft, nontender, no obvious distention. Musculoskeletal: No edema, cyanosis, or clubbing. Neuro: Alert, answering all questions appropriately. Cranial nerves grossly intact. Skin: No rashes or petechiae noted. Psych: Normal affect. Lymphatics: No cervical, calvicular, axillary or inguinal LAD.   LAB RESULTS:  Lab Results  Component Value Date   NA 133 (L) 01/13/2021   K 3.6 01/13/2021   CL 103 01/13/2021   CO2 23 01/13/2021   GLUCOSE 137 (H) 01/13/2021   BUN 47 (H) 01/13/2021   CREATININE 1.46 (H) 01/13/2021   CALCIUM 8.3 (L) 01/13/2021   PROT 6.9 01/13/2021   ALBUMIN 3.5 01/13/2021   AST 21 01/13/2021   ALT 22 01/13/2021   ALKPHOS 34 (L) 01/13/2021   BILITOT 1.2 01/13/2021   GFRNONAA 57 (L) 01/13/2021   GFRAA >60 10/25/2016    Lab Results  Component Value Date   WBC 2.5 (L) 01/13/2021   NEUTROABS 1.9 01/13/2021   HGB 6.3 (L) 01/13/2021   HCT 18.7 (L) 01/13/2021   MCV 97.4 01/13/2021   PLT 373 01/13/2021     STUDIES: US Venous Img Upper Uni Right(DVT)  Result Date: 12/28/2020 CLINICAL DATA:  Right arm swelling EXAM: RIGHT UPPER EXTREMITY VENOUS DOPPLER ULTRASOUND TECHNIQUE: Gray-scale sonography with graded compression, as well as color Doppler and duplex ultrasound were performed to evaluate the upper extremity deep venous system from the level of the subclavian vein and including the jugular, axillary, basilic, radial, ulnar and upper cephalic vein. Spectral Doppler was utilized to evaluate flow at rest and with distal augmentation maneuvers. COMPARISON:  None. FINDINGS: Contralateral Subclavian Vein: Respiratory phasicity is normal and symmetric with the symptomatic side. No evidence of thrombus. Normal compressibility. Internal Jugular Vein: No evidence of thrombus. Normal compressibility, respiratory  phasicity and response to augmentation. Subclavian Vein: No evidence of thrombus. Normal compressibility, respiratory phasicity and response  to augmentation. Axillary Vein: No evidence of thrombus. Normal compressibility, respiratory phasicity and response to augmentation. Cephalic Vein: Occlusive thrombus seen in the left cephalic vein from the shoulder to the antecubital region. Basilic Vein: No evidence of thrombus. Normal compressibility, respiratory phasicity and response to augmentation. Brachial Veins: No evidence of thrombus. Normal compressibility, respiratory phasicity and response to augmentation. Radial Veins: No evidence of thrombus. Normal compressibility, respiratory phasicity and response to augmentation. Ulnar Veins: No evidence of thrombus. Normal compressibility, respiratory phasicity and response to augmentation. Venous Reflux:  None visualized. Other Findings:  None visualized. IMPRESSION: Occlusive thrombus in the right upper arm cephalic vein from the shoulder to the elbow. Electronically Signed   By: Rolm Baptise M.D.   On: 12/28/2020 22:44   Portable chest 1 View  Result Date: 01/12/2021 CLINICAL DATA:  Weakness, dyspnea EXAM: PORTABLE CHEST 1 VIEW COMPARISON:  12/14/2020 chest radiograph. FINDINGS: Stable cardiomediastinal silhouette with mild cardiomegaly. No pneumothorax. No pleural effusion. Borderline mild pulmonary edema. No consolidative airspace disease. IMPRESSION: Borderline mild congestive heart failure. Electronically Signed   By: Ilona Sorrel M.D.   On: 01/12/2021 14:36   CT BONE MARROW BIOPSY & ASPIRATION  Result Date: 01/03/2021 INDICATION: Anemia EXAM: CT BONE MARROW BIOPSY AND ASPIRATION MEDICATIONS: None. ANESTHESIA/SEDATION: Moderate (conscious) sedation was employed during this procedure. A total of Versed 0.5 mg and Fentanyl 50 mcg was administered intravenously. Moderate Sedation Time: 13 minutes. The patient's level of consciousness and vital signs were  monitored continuously by radiology nursing throughout the procedure under my direct supervision. FLUOROSCOPY TIME:  N/a COMPLICATIONS: None immediate. PROCEDURE: Informed written consent was obtained from the patient after a thorough discussion of the procedural risks, benefits and alternatives. All questions were addressed. Maximal Sterile Barrier Technique was utilized including caps, mask, sterile gowns, sterile gloves, sterile drape, hand hygiene and skin antiseptic. A timeout was performed prior to the initiation of the procedure. The patient was placed prone on the CT exam table. Limited CT of the pelvis was performed for planning purposes. Skin entry site was marked, and the overlying skin was prepped and draped in the standard sterile fashion. Local analgesia was obtained with 1% lidocaine. Using CT guidance, an 11 gauge needle was advanced just deep to the cortex of the right posterior ilium. Subsequently, bone marrow aspiration and core biopsy were performed. Specimens were submitted to lab/pathology for handling. Hemostasis was achieved with manual pressure, and a clean dressing was placed. The patient tolerated the procedure well without immediate complication. IMPRESSION: Successful CT-guided bone marrow aspiration and core biopsy of the right posterior ilium. Electronically Signed   By: Albin Felling M.D.   On: 01/03/2021 14:25   ECHOCARDIOGRAM COMPLETE  Result Date: 12/23/2020    ECHOCARDIOGRAM REPORT   Patient Name:   MUNEER LEIDER Date of Exam: 12/23/2020 Medical Rec #:  [id number]         Height:       67.0 in Accession #:    [id number]        Weight:       349.0 lb Date of Birth:  Jul 10, 1965        BSA:          2.562 m Patient Age:    56 years          BP:           129/44 mmHg Patient Gender: M                 HR:  82 bpm. Exam Location:  [facility] Procedure: 2D Echo, Cardiac Doppler and Color Doppler Indications:     Pericardial Effusion I31.3  History:         Patient has no prior  history of Echocardiogram examinations.                  Risk Factors:Diabetes, Hypertension, Dyslipidemia and Sleep                  Apnea.  Sonographer:     Sherrie Sport Referring Phys:  [id number] [facility] AMIN Diagnosing Phys: Ida Rogue MD  Sonographer Comments: Suboptimal apical window and no subcostal window. IMPRESSIONS  1. Left ventricular ejection fraction, by estimation, is 60 to 65%. The left ventricle has normal function. The left ventricle has no regional wall motion abnormalities. There is moderate left ventricular hypertrophy. Left ventricular diastolic parameters are consistent with Grade II diastolic dysfunction (pseudonormalization).  2. Right ventricular systolic function is normal. The right ventricular size is normal. There is normal pulmonary artery systolic pressure. The estimated right ventricular systolic pressure is 08.8 mmHg.  3. Left atrial size was mildly dilated.  4. A small to moderate noncircumferential pericardial effusion is present.No tamponade.  5. The mitral valve is normal in structure. No evidence of mitral valve regurgitation. No evidence of mitral stenosis.  6. The aortic valve was not well visualized. Aortic valve regurgitation is not visualized. No aortic stenosis is present.  7. The inferior vena cava is normal in size with greater than 50% respiratory variability, suggesting right atrial pressure of 3 mmHg. FINDINGS  Left Ventricle: Left ventricular ejection fraction, by estimation, is 60 to 65%. The left ventricle has normal function. The left ventricle has no regional wall motion abnormalities. The left ventricular internal cavity size was normal in size. There is  moderate left ventricular hypertrophy. Left ventricular diastolic parameters are consistent with Grade II diastolic dysfunction (pseudonormalization). Right Ventricle: The right ventricular size is normal. No increase in right ventricular wall thickness. Right ventricular systolic function is normal. There is  normal pulmonary artery systolic pressure. The tricuspid regurgitant velocity is 1.59 m/s, and  with an assumed right atrial pressure of 5 mmHg, the estimated right ventricular systolic pressure is 11.0 mmHg. Left Atrium: Left atrial size was mildly dilated. Right Atrium: Right atrial size was normal in size. Pericardium: A small pericardial effusion is present. There is no evidence of cardiac tamponade. Mitral Valve: The mitral valve is normal in structure. No evidence of mitral valve regurgitation. No evidence of mitral valve stenosis. MV peak gradient, 5.7 mmHg. The mean mitral valve gradient is 3.0 mmHg. Tricuspid Valve: The tricuspid valve is normal in structure. Tricuspid valve regurgitation is mild . No evidence of tricuspid stenosis. Aortic Valve: The aortic valve was not well visualized. Aortic valve regurgitation is not visualized. No aortic stenosis is present. Aortic valve mean gradient measures 4.0 mmHg. Aortic valve peak gradient measures 7.4 mmHg. Aortic valve area, by VTI measures 4.99 cm. Pulmonic Valve: The pulmonic valve was normal in structure. Pulmonic valve regurgitation is not visualized. No evidence of pulmonic stenosis. Aorta: The aortic root is normal in size and structure. Venous: The inferior vena cava is normal in size with greater than 50% respiratory variability, suggesting right atrial pressure of 3 mmHg. IAS/Shunts: No atrial level shunt detected by color flow Doppler.  LEFT VENTRICLE PLAX 2D LVIDd:         5.70 cm   Diastology LVIDs:  3.50 cm   LV e' medial:    6.64 cm/s LV PW:         1.40 cm   LV E/e' medial:  18.4 LV IVS:        1.20 cm   LV e' lateral:   10.30 cm/s LVOT diam:     2.20 cm   LV E/e' lateral: 11.8 LV SV:         111 LV SV Index:   43 LVOT Area:     3.80 cm  RIGHT VENTRICLE RV Basal diam:  5.00 cm RV S prime:     14.90 cm/s TAPSE (M-mode): 5.2 cm LEFT ATRIUM              Index        RIGHT ATRIUM           Index LA diam:        4.70 cm  1.83 cm/m   RA  Area:     46.30 cm LA Vol (A2C):   126.0 ml 49.18 ml/m  RA Volume:   221.00 ml 86.26 ml/m LA Vol (A4C):   105.0 ml 40.98 ml/m LA Biplane Vol: 118.0 ml 46.06 ml/m  AORTIC VALVE                    PULMONIC VALVE AV Area (Vmax):    4.64 cm     PV Vmax:        1.06 m/s AV Area (Vmean):   3.99 cm     PV Vmean:       82.200 cm/s AV Area (VTI):     4.99 cm     PV VTI:         0.204 m AV Vmax:           136.00 cm/s  PV Peak grad:   4.5 mmHg AV Vmean:          92.900 cm/s  PV Mean grad:   3.0 mmHg AV VTI:            0.222 m      RVOT Peak grad: 7 mmHg AV Peak Grad:      7.4 mmHg AV Mean Grad:      4.0 mmHg LVOT Vmax:         166.00 cm/s LVOT Vmean:        97.500 cm/s LVOT VTI:          0.291 m LVOT/AV VTI ratio: 1.31  AORTA Ao Root diam: 3.33 cm MITRAL VALVE                TRICUSPID VALVE MV Area (PHT): 3.33 cm     TR Peak grad:   10.1 mmHg MV Area VTI:   3.76 cm     TR Vmax:        159.00 cm/s MV Peak grad:  5.7 mmHg MV Mean grad:  3.0 mmHg     SHUNTS MV Vmax:       1.19 m/s     Systemic VTI:  0.29 m MV Vmean:      82.0 cm/s    Systemic Diam: 2.20 cm MV Decel Time: 228 msec     Pulmonic VTI:  0.260 m MV E velocity: 122.00 cm/s MV A velocity: 121.00 cm/s MV E/A ratio:  1.01 Ida Rogue MD Electronically signed by Ida Rogue MD Signature Date/Time: 12/23/2020/10:15:34 AM    Final     ASSESSMENT: IgA deficiency and MDS, COVID-positive  PLAN:    IgA deficiency: Patient reports he was diagnosed as a child, but does not say he had repeated infections throughout adulthood.  Recent IgA levels were undetectable with a normal IgM and IgG.  No intervention is needed, but patients with isolated IgA deficiency can have anaphylactoid reactions to blood transfusions.  Anti-IgA antibodies for further evaluation are pending at time of dictation. MDS: Bone marrow biopsy on January 03, 2021 revealed a hypercellular marrow with erythroid hyperplasia and dyserythropoiesis.  Overall, the findings in the marrow are  concerning for myelodysplastic syndrome.  No blasts, increased plasma cells, or clonality was noted.  Cytogenetics are normal.  Continue with supportive care at this time.  Can consider Vidaza in the future.   Severe anemia: Hemoglobin has improved from 4.7-6.3 with 2 units of washed and treated red blood cells.  Patient reports he has 2 additional units planned.  Iron stores, B12, and folate levels are within normal limits.  Patient does not have evidence of hemolysis.  Once patient's hemoglobin is greater than 7.0 and stable, he can be discharged with close follow-up in the [place]. Acute on chronic renal insufficiency: Improved with improvement of patient's hemoglobin.   COVID: Patient has cough, but essentially incidental upon admission.  Continue current treatment as planned. Leukopenia: Likely related to MDS.  Monitor.  Will follow.    Lloyd Huger, MD   01/13/2021 1:46 PM

## 2021-01-14 ENCOUNTER — Telehealth: Payer: Self-pay | Admitting: *Deleted

## 2021-01-14 ENCOUNTER — Encounter: Payer: Self-pay | Admitting: Emergency Medicine

## 2021-01-14 ENCOUNTER — Encounter (HOSPITAL_COMMUNITY): Payer: Self-pay | Admitting: Oncology

## 2021-01-14 ENCOUNTER — Encounter: Payer: Self-pay | Admitting: Oncology

## 2021-01-14 DIAGNOSIS — E119 Type 2 diabetes mellitus without complications: Secondary | ICD-10-CM

## 2021-01-14 DIAGNOSIS — D649 Anemia, unspecified: Secondary | ICD-10-CM | POA: Diagnosis not present

## 2021-01-14 DIAGNOSIS — I10 Essential (primary) hypertension: Secondary | ICD-10-CM | POA: Diagnosis not present

## 2021-01-14 DIAGNOSIS — U071 COVID-19: Secondary | ICD-10-CM | POA: Diagnosis not present

## 2021-01-14 DIAGNOSIS — N179 Acute kidney failure, unspecified: Secondary | ICD-10-CM | POA: Diagnosis not present

## 2021-01-14 LAB — CBC
HCT: 22.7 % — ABNORMAL LOW (ref 39.0–52.0)
Hemoglobin: 7.7 g/dL — ABNORMAL LOW (ref 13.0–17.0)
MCH: 32.1 pg (ref 26.0–34.0)
MCHC: 33.9 g/dL (ref 30.0–36.0)
MCV: 94.6 fL (ref 80.0–100.0)
Platelets: 365 10*3/uL (ref 150–400)
RBC: 2.4 MIL/uL — ABNORMAL LOW (ref 4.22–5.81)
RDW: 19 % — ABNORMAL HIGH (ref 11.5–15.5)
WBC: 2.7 10*3/uL — ABNORMAL LOW (ref 4.0–10.5)
nRBC: 0 % (ref 0.0–0.2)

## 2021-01-14 LAB — BASIC METABOLIC PANEL
Anion gap: 7 (ref 5–15)
BUN: 34 mg/dL — ABNORMAL HIGH (ref 6–20)
CO2: 23 mmol/L (ref 22–32)
Calcium: 8.4 mg/dL — ABNORMAL LOW (ref 8.9–10.3)
Chloride: 107 mmol/L (ref 98–111)
Creatinine, Ser: 1.14 mg/dL (ref 0.61–1.24)
GFR, Estimated: >60 mL/min (ref >60)
Glucose, Bld: 99 mg/dL (ref 70–99)
Potassium: 3.7 mmol/L (ref 3.5–5.1)
Sodium: 137 mmol/L (ref 135–145)

## 2021-01-14 LAB — IGG, IGA, IGM
IgA: <5 mg/dL — ABNORMAL LOW (ref 90–386)
IgG (Immunoglobin G), Serum: 1327 mg/dL (ref 603–1613)
IgM (Immunoglobulin M), Srm: 124 mg/dL (ref 20–172)

## 2021-01-14 LAB — GLUCOSE, CAPILLARY
Glucose-Capillary: 101 mg/dL — ABNORMAL HIGH (ref 70–99)
Glucose-Capillary: 126 mg/dL — ABNORMAL HIGH (ref 70–99)
Glucose-Capillary: 142 mg/dL — ABNORMAL HIGH (ref 70–99)
Glucose-Capillary: 188 mg/dL — ABNORMAL HIGH (ref 70–99)

## 2021-01-14 LAB — HAPTOGLOBIN: Haptoglobin: 131 mg/dL (ref 29–370)

## 2021-01-14 MED ORDER — POLYETHYLENE GLYCOL 3350 17 G PO PACK
17.0000 g | PACK | Freq: Every day | ORAL | Status: DC
  Filled 2021-01-14 (×3): qty 1

## 2021-01-14 NOTE — Care Management Important Message (Signed)
Important Message  Patient Details  Name: Patrick Duncan MRN: [id number] Date of Birth: [birth year]   Medicare Important Message Given:  Yes  Patient is in an isolation room so I talked with him by phone [phone]) and reviewed the Important Message from Medicare.  He is in agreement with his upcoming discharge and no copy is needed. I wished him a speedy recovery and thanked him for his time.   Juliann Pulse A Mattheus Rauls 01/14/2021, 2:27 PM

## 2021-01-14 NOTE — Plan of Care (Signed)

## 2021-01-14 NOTE — Progress Notes (Addendum)
PROGRESS NOTE  Patrick Duncan    DOB: Jul 16, 1965, 55 y.o.  EQA:[id number]  PCP: Sofie Hartigan, MD   Code Status: Full Code   DOA: 01/12/2021   LOS: 2  Brief Narrative of Current Hospitalization  Patrick Duncan is a 55 y.o. male with a PMH significant for IgA deficiency, myelodysplastic syndrome, HTN, type II DM, OSA, AKI. They presented from home to the ED on 01/12/2021 with symptomatic anemia which was detected at outpatient heme/onc appointment with a hgb 4.7. In the ED, they received 2 units pRBCs which improved hgb to 5.4. He endorsed improvement in his symptoms overall Patient was admitted to medicine service for further workup and management of symptomatic anemia as outlined in detail below.  01/14/21 -stable  Assessment & Plan  Principal Problem:   Symptomatic anemia Active Problems:   Diabetes (HCC)   HTN (hypertension)   OSA (obstructive sleep apnea)   AKI (acute kidney injury) (HCC)   Leukopenia   IgA deficiency (HCC)   Myelodysplastic syndrome (HCC)   COVID-19   Venous insufficiency   Diastolic dysfunction without heart failure   MDS (myelodysplastic syndrome) (HCC)  Symptomatic anemia in setting of myelodysplastic syndrome- hgb has gradually been improving with transfusions. S/p 4units washed RBCs. Hgb 4.7>5.4>6.3> hgb 7.7 this am after receiving 2 additional units pRBCs overnight.  Given patient's acute drop in hgb initiating this admission, would like to see stable hgb above 7 for at least 24hr prior to discharge. - heme/onc following, appreciate recs - will need OP follow up - CBC am  IgA deficiency- outpatient workup in place.  COVID-19 +. Presenting symptoms of generalized fatigue likely in part due to infection but patient is hemodynamically stable ORA - continue remdesivir - supportive care PRN  Mild hyponatremia- resolved. Na+ 133>137 - monitor  AKI- resolved. Likely as result of poor perfusion but improved with transfusion. Cr  2.59>1.46>1.14  Class III obesity  DVT prophylaxis: SCDs Start: 01/12/21 1410   Diet:  Diet Orders (From admission, onward)     Start     Ordered   01/12/21 1409  Diet Carb Modified Fluid consistency: Thin; Room service appropriate? Yes  Diet effective now       Question Answer Comment  Diet-HS Snack? Nothing   Calorie Level Medium 1600-2000   Fluid consistency: Thin   Room service appropriate? Yes      01/12/21 1413            Subjective 01/14/21    Pt reports feeling stable. He expresses some anxiety about going home and having to come right back for anemia as he has been admitted several times for the same in recent past.   Disposition Plan & Communication  Patient status: Inpatient  Admitted From: Home Disposition: Home, pending stable hgb above 7 for at least 24 hours Anticipated discharge date: 12/31  Family Communication: mother on phone Consults, Procedures, Significant Events  Consultants:  Heme/onc  Procedures/significant events:  Blood transfusion x4 units washed pRBCs  Antimicrobials:  Anti-infectives (From admission, onward)    Start     Dose/Rate Route Frequency Ordered Stop   01/13/21 1000  remdesivir 100 mg in sodium chloride 0.9 % 100 mL IVPB       See Hyperspace for full Linked Orders Report.   100 mg 200 mL/hr over 30 Minutes Intravenous Daily 01/12/21 1413 01/17/21 0959   01/12/21 1500  remdesivir 200 mg in sodium chloride 0.9% 250 mL IVPB       See  Hyperspace for full Linked Orders Report.   200 mg 580 mL/hr over 30 Minutes Intravenous Once 01/12/21 1413 01/12/21 1513       Objective   Vitals:   01/14/21 0215 01/14/21 0315 01/14/21 0430 01/14/21 0530  BP: (!) 94/47 (!) 96/52 (!) 93/34 (!) 95/42  Pulse: 66 72 70 72  Resp: 18 18 18 18   Temp: 98.5 F (36.9 C) 98.6 F (37 C) 97.7 F (36.5 C) 98.1 F (36.7 C)  TempSrc: Oral Oral Oral Oral  SpO2: 95% 95% 96% 96%  Weight:      Height:        Intake/Output Summary (Last 24  hours) at 01/14/2021 0706 Last data filed at 01/14/2021 0430 Gross per 24 hour  Intake 821.5 ml  Output --  Net 821.5 ml    Filed Weights   01/13/21 0048  Weight: (!) 146.5 kg    Patient BMI: Body mass index is 50.59 kg/m.   Physical Exam:  General: awake, alert, NAD HEENT: atraumatic, clear conjunctiva, anicteric sclera, MMM, hearing grossly normal Respiratory: normal respiratory effort. Cardiovascular: normal S1/S2, RRR, no JVD, murmurs, quick capillary refill  Nervous: A&O x3. no gross focal neurologic deficits, normal speech Extremities: moves all equally, trace lower extremity edema, normal tone Skin: dry, intact, normal temperature, normal color. No rashes, lesions or ulcers on exposed skin Psychiatry: normal mood, congruent affect  Labs   I have personally reviewed following labs and imaging studies Admission on 01/12/2021  Component Date Value Ref Range Status   WBC 01/12/2021 2.9 (L)  4.0 - 10.5 K/uL Final   RBC 01/12/2021 1.54 (L)  4.22 - 5.81 MIL/uL Final   Hemoglobin 01/12/2021 5.4 (L)  13.0 - 17.0 g/dL Final   HCT 01/12/2021 15.8 (L)  39.0 - 52.0 % Final   MCV 01/12/2021 102.6 (H)  80.0 - 100.0 fL Final   MCH 01/12/2021 35.1 (H)  26.0 - 34.0 pg Final   MCHC 01/12/2021 34.2  30.0 - 36.0 g/dL Final   RDW 01/12/2021 20.0 (H)  11.5 - 15.5 % Final   Platelets 01/12/2021 397  150 - 400 K/uL Final   nRBC 01/12/2021 0.0  0.0 - 0.2 % Final   Neutrophils Relative % 01/12/2021 82  % Final   Neutro Abs 01/12/2021 2.4  1.7 - 7.7 K/uL Final   Lymphocytes Relative 01/12/2021 6  % Final   Lymphs Abs 01/12/2021 0.2 (L)  0.7 - 4.0 K/uL Final   Monocytes Relative 01/12/2021 11  % Final   Monocytes Absolute 01/12/2021 0.3  0.1 - 1.0 K/uL Final   Eosinophils Relative 01/12/2021 0  % Final   Eosinophils Absolute 01/12/2021 0.0  0.0 - 0.5 K/uL Final   Basophils Relative 01/12/2021 0  % Final   Basophils Absolute 01/12/2021 0.0  0.0 - 0.1 K/uL Final   Immature Granulocytes  01/12/2021 1  % Final   Abs Immature Granulocytes 01/12/2021 0.04  0.00 - 0.07 K/uL Final   Sodium 01/12/2021 135  135 - 145 mmol/L Final   Potassium 01/12/2021 4.2  3.5 - 5.1 mmol/L Final   Chloride 01/12/2021 102  98 - 111 mmol/L Final   CO2 01/12/2021 23  22 - 32 mmol/L Final   Glucose, Bld 01/12/2021 110 (H)  70 - 99 mg/dL Final   BUN 01/12/2021 67 (H)  6 - 20 mg/dL Final   Creatinine, Ser 01/12/2021 2.43 (H)  0.61 - 1.24 mg/dL Final   Calcium 01/12/2021 8.8 (L)  8.9 - 10.3 mg/dL  Final   Total Protein 01/12/2021 6.9  6.5 - 8.1 g/dL Final   Albumin 01/12/2021 3.5  3.5 - 5.0 g/dL Final   AST 01/12/2021 25  15 - 41 U/L Final   ALT 01/12/2021 23  0 - 44 U/L Final   Alkaline Phosphatase 01/12/2021 39  38 - 126 U/L Final   Total Bilirubin 01/12/2021 1.2  0.3 - 1.2 mg/dL Final   GFR, Estimated 01/12/2021 31 (L)  >60 mL/min Final   Anion gap 01/12/2021 10  5 - 15 Final   ABO/RH(D) 01/12/2021 O POS   Final   Antibody Screen 01/12/2021 POS   Final   Sample Expiration 01/12/2021 01/15/2021,2359   Final   Antibody Identification 42/35/3614 WARM AUTOANTIBODY   Final   DAT, IgG 01/12/2021 POS   Final   DAT, complement 01/12/2021 POS   Final   Unit Number 01/12/2021 [id number]   Final   Blood Component Type 01/12/2021 RBC,WASH IRR   Final   Unit division 01/12/2021 00   Final   Status of Unit 01/12/2021 ISSUED   Final   Transfusion Status 01/12/2021 OK TO TRANSFUSE   Final   Crossmatch Result 01/12/2021 INCOMPATIBLE   Final   Unit Number 01/12/2021 [id number]   Final   Blood Component Type 01/12/2021 RBC,WASH IRR   Final   Unit division 01/12/2021 00   Final   Status of Unit 01/12/2021 ISSUED   Final   Transfusion Status 01/12/2021 OK TO TRANSFUSE   Final   Crossmatch Result 01/12/2021 INCOMPATIBLE   Final   Unit Number 01/12/2021 [id number]   Final   Blood Component Type 01/12/2021 RBC, LR, WASHED   Final   Unit division 01/12/2021 00   Final   Status of Unit 01/12/2021 ISSUED    Final   Transfusion Status 01/12/2021 OK TO TRANSFUSE   Final   Crossmatch Result 01/12/2021 INCOMPATIBLE   Final   Unit Number 01/12/2021 [id number]   Final   Blood Component Type 01/12/2021 RBC, LR, WASHED   Final   Unit division 01/12/2021 00   Final   Status of Unit 01/12/2021 ISSUED   Final   Transfusion Status 01/12/2021 OK TO TRANSFUSE   Final   Crossmatch Result 01/12/2021 INCOMPATIBLE   Final   Retic Ct Pct 01/12/2021 5.8 (H)  0.4 - 3.1 % Final   RBC. 01/12/2021 1.59 (L)  4.22 - 5.81 MIL/uL Final   Retic Count, Absolute 01/12/2021 91.4  19.0 - 186.0 K/uL Final   Immature Retic Fract 01/12/2021 25.1 (H)  2.3 - 15.9 % Final   SARS Coronavirus 2 by RT PCR 01/12/2021 POSITIVE (A)  NEGATIVE Final   Influenza A by PCR 01/12/2021 NEGATIVE  NEGATIVE Final   Influenza B by PCR 01/12/2021 NEGATIVE  NEGATIVE Final   Prothrombin Time 01/12/2021 14.4  11.4 - 15.2 seconds Final   INR 01/12/2021 1.1  0.8 - 1.2 Final   Order Confirmation 01/12/2021    Final                   Value:ORDER PROCESSED BY BLOOD BANK Performed at [facility], [address] [place], [place] [address]    LDH 01/12/2021 129  98 - 192 U/L Final   Vitamin B-12 01/12/2021 770  180 - 914 pg/mL Final   Folate 01/12/2021 11.3  >5.9 ng/mL Final   Glucose-Capillary 01/12/2021 128 (H)  70 - 99 mg/dL Final   B Natriuretic Peptide 01/12/2021 118.4 (H)  0.0 - 100.0 pg/mL Final  WBC 01/13/2021 2.5 (L)  4.0 - 10.5 K/uL Final   RBC 01/13/2021 1.92 (L)  4.22 - 5.81 MIL/uL Final   Hemoglobin 01/13/2021 6.3 (L)  13.0 - 17.0 g/dL Final   HCT 01/13/2021 18.7 (L)  39.0 - 52.0 % Final   MCV 01/13/2021 97.4  80.0 - 100.0 fL Final   MCH 01/13/2021 32.8  26.0 - 34.0 pg Final   MCHC 01/13/2021 33.7  30.0 - 36.0 g/dL Final   RDW 01/13/2021 20.5 (H)  11.5 - 15.5 % Final   Platelets 01/13/2021 373  150 - 400 K/uL Final   nRBC 01/13/2021 0.0  0.0 - 0.2 % Final   Neutrophils Relative % 01/13/2021 75  % Final   Neutro Abs  01/13/2021 1.9  1.7 - 7.7 K/uL Final   Lymphocytes Relative 01/13/2021 9  % Final   Lymphs Abs 01/13/2021 0.2 (L)  0.7 - 4.0 K/uL Final   Monocytes Relative 01/13/2021 14  % Final   Monocytes Absolute 01/13/2021 0.3  0.1 - 1.0 K/uL Final   Eosinophils Relative 01/13/2021 0  % Final   Eosinophils Absolute 01/13/2021 0.0  0.0 - 0.5 K/uL Final   Basophils Relative 01/13/2021 0  % Final   Basophils Absolute 01/13/2021 0.0  0.0 - 0.1 K/uL Final   Immature Granulocytes 01/13/2021 2  % Final   Abs Immature Granulocytes 01/13/2021 0.04  0.00 - 0.07 K/uL Final   Sodium 01/13/2021 133 (L)  135 - 145 mmol/L Final   Potassium 01/13/2021 3.6  3.5 - 5.1 mmol/L Final   Chloride 01/13/2021 103  98 - 111 mmol/L Final   CO2 01/13/2021 23  22 - 32 mmol/L Final   Glucose, Bld 01/13/2021 137 (H)  70 - 99 mg/dL Final   BUN 01/13/2021 47 (H)  6 - 20 mg/dL Final   Creatinine, Ser 01/13/2021 1.46 (H)  0.61 - 1.24 mg/dL Final   Calcium 01/13/2021 8.3 (L)  8.9 - 10.3 mg/dL Final   Total Protein 01/13/2021 6.9  6.5 - 8.1 g/dL Final   Albumin 01/13/2021 3.5  3.5 - 5.0 g/dL Final   AST 01/13/2021 21  15 - 41 U/L Final   ALT 01/13/2021 22  0 - 44 U/L Final   Alkaline Phosphatase 01/13/2021 34 (L)  38 - 126 U/L Final   Total Bilirubin 01/13/2021 1.2  0.3 - 1.2 mg/dL Final   GFR, Estimated 01/13/2021 57 (L)  >60 mL/min Final   Anion gap 01/13/2021 7  5 - 15 Final   ISSUE DATE / TIME 01/12/2021 [id number]   Final   Blood Product Unit Number 01/12/2021 [id number]   Final   PRODUCT CODE 01/12/2021 [id number]   Final   Unit Type and Rh 01/12/2021 5100   Final   Blood Product Expiration Date 01/12/2021 [id number]   Final   ISSUE DATE / TIME 01/12/2021 [id number]   Final   Blood Product Unit Number 01/12/2021 [id number]   Final   PRODUCT CODE 01/12/2021 [id number]   Final   Unit Type and Rh 01/12/2021 5100   Final   Blood Product Expiration Date 01/12/2021 [id number]   Final   ISSUE DATE / TIME 01/12/2021  [id number]   Final   Blood Product Unit Number 01/12/2021 [id number]   Final   PRODUCT CODE 01/12/2021 [id number]   Final   Unit Type and Rh 01/12/2021 5100   Final   Blood Product Expiration Date 01/12/2021 [id number]   Final   ISSUE DATE / TIME 01/12/2021 [id number]  Final   Blood Product Unit Number 01/12/2021 [id number]   Final   Unit Type and Rh 01/12/2021 5100   Final   Blood Product Expiration Date 01/12/2021 [id number]   Final   Glucose-Capillary 01/13/2021 107 (H)  70 - 99 mg/dL Final   Glucose-Capillary 01/13/2021 91  70 - 99 mg/dL Final   Glucose-Capillary 01/13/2021 105 (H)  70 - 99 mg/dL Final   Order Confirmation 01/13/2021    Final                   Value:ORDER PROCESSED BY BLOOD BANK Performed at [facility], [address] [place], [place] [address]    Glucose-Capillary 01/13/2021 194 (H)  70 - 99 mg/dL Final   WBC 01/14/2021 2.7 (L)  4.0 - 10.5 K/uL Final   RBC 01/14/2021 2.40 (L)  4.22 - 5.81 MIL/uL Final   Hemoglobin 01/14/2021 7.7 (L)  13.0 - 17.0 g/dL Final   HCT 01/14/2021 22.7 (L)  39.0 - 52.0 % Final   MCV 01/14/2021 94.6  80.0 - 100.0 fL Final   MCH 01/14/2021 32.1  26.0 - 34.0 pg Final   MCHC 01/14/2021 33.9  30.0 - 36.0 g/dL Final   RDW 01/14/2021 19.0 (H)  11.5 - 15.5 % Final   Platelets 01/14/2021 365  150 - 400 K/uL Final   nRBC 01/14/2021 0.0  0.0 - 0.2 % Final   Sodium 01/14/2021 137  135 - 145 mmol/L Final   Potassium 01/14/2021 3.7  3.5 - 5.1 mmol/L Final   Chloride 01/14/2021 107  98 - 111 mmol/L Final   CO2 01/14/2021 23  22 - 32 mmol/L Final   Glucose, Bld 01/14/2021 99  70 - 99 mg/dL Final   BUN 01/14/2021 34 (H)  6 - 20 mg/dL Final   Creatinine, Ser 01/14/2021 1.14  0.61 - 1.24 mg/dL Final   Calcium 01/14/2021 8.4 (L)  8.9 - 10.3 mg/dL Final   GFR, Estimated 01/14/2021 >60  >60 mL/min Final   Anion gap 01/14/2021 7  5 - 15 Final   Glucose-Capillary 01/13/2021 201 (H)  70 - 99 mg/dL Final   Glucose-Capillary  01/13/2021 278 (H)  70 - 99 mg/dL Final   Glucose-Capillary 01/14/2021 101 (H)  70 - 99 mg/dL Final    Imaging Studies  Portable chest 1 View  Result Date: 01/12/2021 CLINICAL DATA:  Weakness, dyspnea EXAM: PORTABLE CHEST 1 VIEW COMPARISON:  12/14/2020 chest radiograph. FINDINGS: Stable cardiomediastinal silhouette with mild cardiomegaly. No pneumothorax. No pleural effusion. Borderline mild pulmonary edema. No consolidative airspace disease. IMPRESSION: Borderline mild congestive heart failure. Electronically Signed   By: Ilona Sorrel M.D.   On: 01/12/2021 14:36    Medications   Scheduled Meds:  albuterol  2 puff Inhalation Q6H   amLODipine  10 mg Oral Daily   dexamethasone  6 mg Oral Q24H   dorzolamide-timolol  1 drop Right Eye BID   fenofibrate  160 mg Oral Daily   ferrous sulfate  325 mg Oral BID WC   insulin aspart  0-20 Units Subcutaneous TID WC   insulin aspart  6 Units Subcutaneous TID WC   insulin glargine-yfgn  32 Units Subcutaneous q AM   metoprolol tartrate  50 mg Oral BID   pantoprazole  40 mg Oral Daily   pravastatin  10 mg Oral q1800   terazosin  10 mg Oral Daily   traZODone  150 mg Oral QHS   vitamin B-12  1,000 mcg Oral Daily  No recently discontinued medications to reconcile  LOS: 2 days   Richarda Osmond, DO [facility] 01/14/2021, 7:06 AM   Available by Epic secure chat 7AM-7PM. If 7PM-7AM, please contact night-coverage Refer to [email or web address] to contact the [facility] Attending or Consulting provider for this pt

## 2021-01-14 NOTE — Telephone Encounter (Signed)
Sister in [place], Elwin Mocha called Patrick Duncan, [place] [phone]) to advocate for pt's wife, Patrick Duncan. I reviewed the chart and Patrick Duncan is not on the [facility]. I explained this and asked that she contact the patient to have her added to the release of information. I would take the concern down and give the message to the team. Patrick Duncan gave verbal understanding and thanked me for assisting her. She stated that patient is currently in the hospital and the [facility] needed her to have a letter written up by Dr. Grayland Ormond or his team. Letter needs to state that "patient is currently in the hospital with a critical illness and that the patient's wife is the primary care giver. Please take this into consideration." Wife needs the letter today if possible routed to the [place].  Patrick Duncan/Dr. Finnegan/Lauren- Please advise if letter can be written on behalf of the family. Thanks. Patrick Conn, RN

## 2021-01-14 NOTE — Telephone Encounter (Signed)
Letter sent electronically through mychart.

## 2021-01-15 DIAGNOSIS — U071 COVID-19: Secondary | ICD-10-CM | POA: Diagnosis not present

## 2021-01-15 DIAGNOSIS — D469 Myelodysplastic syndrome, unspecified: Secondary | ICD-10-CM | POA: Diagnosis not present

## 2021-01-15 DIAGNOSIS — D649 Anemia, unspecified: Secondary | ICD-10-CM | POA: Diagnosis not present

## 2021-01-15 LAB — BASIC METABOLIC PANEL
Anion gap: 7 (ref 5–15)
BUN: 29 mg/dL — ABNORMAL HIGH (ref 6–20)
CO2: 23 mmol/L (ref 22–32)
Calcium: 8.5 mg/dL — ABNORMAL LOW (ref 8.9–10.3)
Chloride: 105 mmol/L (ref 98–111)
Creatinine, Ser: 1.08 mg/dL (ref 0.61–1.24)
GFR, Estimated: >60 mL/min (ref >60)
Glucose, Bld: 144 mg/dL — ABNORMAL HIGH (ref 70–99)
Potassium: 3.8 mmol/L (ref 3.5–5.1)
Sodium: 135 mmol/L (ref 135–145)

## 2021-01-15 LAB — CBC
HCT: 26 % — ABNORMAL LOW (ref 39.0–52.0)
Hemoglobin: 8.9 g/dL — ABNORMAL LOW (ref 13.0–17.0)
MCH: 33 pg (ref 26.0–34.0)
MCHC: 34.2 g/dL (ref 30.0–36.0)
MCV: 96.3 fL (ref 80.0–100.0)
Platelets: 416 10*3/uL — ABNORMAL HIGH (ref 150–400)
RBC: 2.7 MIL/uL — ABNORMAL LOW (ref 4.22–5.81)
RDW: 18.9 % — ABNORMAL HIGH (ref 11.5–15.5)
WBC: 3.6 10*3/uL — ABNORMAL LOW (ref 4.0–10.5)
nRBC: 0 % (ref 0.0–0.2)

## 2021-01-15 LAB — GLUCOSE, CAPILLARY
Glucose-Capillary: 144 mg/dL — ABNORMAL HIGH (ref 70–99)
Glucose-Capillary: 127 mg/dL — ABNORMAL HIGH (ref 70–99)
Glucose-Capillary: 149 mg/dL — ABNORMAL HIGH (ref 70–99)
Glucose-Capillary: 160 mg/dL — ABNORMAL HIGH (ref 70–99)

## 2021-01-15 NOTE — Plan of Care (Signed)

## 2021-01-15 NOTE — Progress Notes (Signed)
PROGRESS NOTE    Patrick Duncan  FBP:[id number] DOB: [birth year] DOA: 01/12/2021 PCP: Sofie Hartigan, MD   Assessment & Plan:   Principal Problem:   Symptomatic anemia Active Problems:   Insulin dependent type 2 diabetes mellitus (HCC)   HTN (hypertension)   OSA (obstructive sleep apnea)   AKI (acute kidney injury) ([place])   Leukopenia   IgA deficiency (HCC)   Myelodysplastic syndrome ([place])   COVID-19   Venous insufficiency   Diastolic dysfunction without heart failure   MDS (myelodysplastic syndrome) (HCC)  Symptomatic anemia: secondary to MDS.  S/p 6 units washed RBCs. H&H are trending up today. Will continue to monitor   IgA deficiency: management per heme/onco as an outpatient    COVID-19: continue on IV remdesivir, bronchodilators & encourage incentive spirometry    Hyponatremia: resolved    AKI: resolved   Morbid obesity: BMI 50.5. Complicates overall care & prognosis   DVT prophylaxis: SCDs Code Status: full  Family Communication:  Disposition Plan: likely d/c home tomorrow   Level of care: Med-Surg  Status is: Inpatient  Remains inpatient appropriate because: severity of illness    Consultants:   Procedures:   Antimicrobials:    Subjective: Pt c/o fatigue   Objective: Vitals:   01/14/21 0752 01/14/21 1203 01/14/21 2028 01/15/21 0617  BP: (!) 127/55 (!) 102/50 (!) 136/57 (!) 112/43  Pulse: 72 67 75 68  Resp: 16 18 20 18   Temp: 98.2 F (36.8 C) 97.7 F (36.5 C) 97.8 F (36.6 C) (!) 97.5 F (36.4 C)  TempSrc: Oral Oral Oral Oral  SpO2: 99% 98% 98% 100%  Weight:      Height:        Intake/Output Summary (Last 24 hours) at 01/15/2021 0757 Last data filed at 01/15/2021 0631 Gross per 24 hour  Intake 200 ml  Output --  Net 200 ml   Filed Weights   01/13/21 0048  Weight: (!) 146.5 kg    Examination:  General exam: Appears calm and comfortable. Morbidly obese  Respiratory system: Clear to auscultation. Respiratory effort  normal. Cardiovascular system: S1 & S2+. No  rubs, gallops or clicks.  Gastrointestinal system: Abdomen is obese, soft and nontender. Normal bowel sounds heard. Central nervous system: Alert and oriented.Moves all extremities  Psychiatry: Judgement and insight appear normal. Mood & affect appropriate.     Data Reviewed: I have personally reviewed following labs and imaging studies  CBC: Recent Labs  Lab 01/12/21 0834 01/12/21 1139 01/13/21 0914 01/14/21 0609 01/15/21 0623  WBC 3.5* 2.9* 2.5* 2.7* 3.6*  NEUTROABS 2.9 2.4 1.9  --   --   HGB 4.7* 5.4* 6.3* 7.7* 8.9*  HCT 14.2* 15.8* 18.7* 22.7* 26.0*  MCV 100.7* 102.6* 97.4 94.6 96.3  PLT 346 397 373 365 824*   Basic Metabolic Panel: Recent Labs  Lab 01/12/21 0834 01/12/21 1139 01/13/21 0914 01/14/21 0609  NA 133* 135 133* 137  K 4.0 4.2 3.6 3.7  CL 100 102 103 107  CO2 20* 23 23 23   GLUCOSE 145* 110* 137* 99  BUN 68* 67* 47* 34*  CREATININE 2.59* 2.43* 1.46* 1.14  CALCIUM 8.6* 8.8* 8.3* 8.4*   GFR: Estimated Creatinine Clearance: 104.2 mL/min (by C-G formula based on SCr of 1.14 mg/dL). Liver Function Tests: Recent Labs  Lab 01/12/21 0834 01/12/21 1139 01/13/21 0914  AST 28 25 21   ALT 22 23 22   ALKPHOS 38 39 34*  BILITOT 1.2 1.2 1.2  PROT 7.0 6.9 6.9  ALBUMIN  3.4* 3.5 3.5   No results for input(s): LIPASE, AMYLASE in the last 168 hours. No results for input(s): AMMONIA in the last 168 hours. Coagulation Profile: Recent Labs  Lab 01/12/21 1224  INR 1.1   Cardiac Enzymes: No results for input(s): CKTOTAL, CKMB, CKMBINDEX, TROPONINI in the last 168 hours. BNP (last 3 results) No results for input(s): PROBNP in the last 8760 hours. HbA1C: No results for input(s): HGBA1C in the last 72 hours. CBG: Recent Labs  Lab 01/14/21 0653 01/14/21 1208 01/14/21 1620 01/14/21 2049 01/15/21 0621  GLUCAP 101* 126* 142* 188* 144*   Lipid Profile: No results for input(s): CHOL, HDL, LDLCALC, TRIG, CHOLHDL,  LDLDIRECT in the last 72 hours. Thyroid Function Tests: No results for input(s): TSH, T4TOTAL, FREET4, T3FREE, THYROIDAB in the last 72 hours. Anemia Panel: Recent Labs    01/12/21 0834 01/12/21 0839 01/12/21 1139 01/12/21 1743  VITAMINB12  --  602  --  770  FOLATE  --   --   --  11.3  FERRITIN 288  --   --   --   TIBC 414  --   --   --   IRON 104  --   --   --   RETICCTPCT  --   --  5.8*  --    Sepsis Labs: No results for input(s): PROCALCITON, LATICACIDVEN in the last 168 hours.  Recent Results (from the past 240 hour(s))  Resp Panel by RT-PCR (Flu A&B, Covid) Nasopharyngeal Swab     Status: Abnormal   Collection Time: 01/12/21 12:24 PM   Specimen: Nasopharyngeal Swab; Nasopharyngeal(NP) swabs in vial transport medium  Result Value Ref Range Status   SARS Coronavirus 2 by RT PCR POSITIVE (A) NEGATIVE Final    Comment: (NOTE) SARS-CoV-2 target nucleic acids are DETECTED.  The SARS-CoV-2 RNA is generally detectable in upper respiratory specimens during the acute phase of infection. Positive results are indicative of the presence of the identified virus, but do not rule out bacterial infection or co-infection with other pathogens not detected by the test. Clinical correlation with patient history and other diagnostic information is necessary to determine patient infection status. The expected result is Negative.  Fact Sheet for Patients: EntrepreneurPulse.com.au  Fact Sheet for Healthcare Providers: IncredibleEmployment.be  This test is not yet approved or cleared by the [place] FDA and  has been authorized for detection and/or diagnosis of SARS-CoV-2 by FDA under an Emergency Use Authorization (EUA).  This EUA will remain in effect (meaning this test can be used) for the duration of  the COVID-19 declaration under Section 564(b)(1) of the A ct, 21 U.S.C. section 360bbb-3(b)(1), unless the authorization is terminated or revoked  sooner.     Influenza A by PCR NEGATIVE NEGATIVE Final   Influenza B by PCR NEGATIVE NEGATIVE Final    Comment: (NOTE) The Xpert Xpress SARS-CoV-2/FLU/RSV plus assay is intended as an aid in the diagnosis of influenza from Nasopharyngeal swab specimens and should not be used as a sole basis for treatment. Nasal washings and aspirates are unacceptable for Xpert Xpress SARS-CoV-2/FLU/RSV testing.  Fact Sheet for Patients: EntrepreneurPulse.com.au  Fact Sheet for Healthcare Providers: IncredibleEmployment.be  This test is not yet approved or cleared by the [place] FDA and has been authorized for detection and/or diagnosis of SARS-CoV-2 by FDA under an Emergency Use Authorization (EUA). This EUA will remain in effect (meaning this test can be used) for the duration of the COVID-19 declaration under Section 564(b)(1) of the Act, 21  U.S.C. section 360bbb-3(b)(1), unless the authorization is terminated or revoked.  Performed at [facility], [address] [place], [place] [address]          Radiology Studies: No results found.      Scheduled Meds:  albuterol  2 puff Inhalation Q6H   amLODipine  10 mg Oral Daily   dexamethasone  6 mg Oral Q24H   dorzolamide-timolol  1 drop Right Eye BID   fenofibrate  160 mg Oral Daily   ferrous sulfate  325 mg Oral BID WC   insulin aspart  0-20 Units Subcutaneous TID WC   insulin aspart  6 Units Subcutaneous TID WC   insulin glargine-yfgn  32 Units Subcutaneous q AM   metoprolol tartrate  50 mg Oral BID   polyethylene glycol  17 g Oral Daily   pravastatin  10 mg Oral q1800   terazosin  10 mg Oral Daily   traZODone  150 mg Oral QHS   vitamin B-12  1,000 mcg Oral Daily   Continuous Infusions:  remdesivir 100 mg in NS 100 mL 200 mL/hr at 01/15/21 0631     LOS: 3 days    Time spent: 32 mins     Wyvonnia Dusky, MD [facility] Pager [phone]  If 7PM-7AM,  please contact night-coverage 01/15/2021, 7:57 AM

## 2021-01-16 DIAGNOSIS — D469 Myelodysplastic syndrome, unspecified: Secondary | ICD-10-CM | POA: Diagnosis not present

## 2021-01-16 DIAGNOSIS — U071 COVID-19: Secondary | ICD-10-CM | POA: Diagnosis not present

## 2021-01-16 DIAGNOSIS — D649 Anemia, unspecified: Secondary | ICD-10-CM | POA: Diagnosis not present

## 2021-01-16 LAB — BASIC METABOLIC PANEL
Anion gap: 5 (ref 5–15)
BUN: 27 mg/dL — ABNORMAL HIGH (ref 6–20)
CO2: 24 mmol/L (ref 22–32)
Calcium: 8.9 mg/dL (ref 8.9–10.3)
Chloride: 105 mmol/L (ref 98–111)
Creatinine, Ser: 0.99 mg/dL (ref 0.61–1.24)
GFR, Estimated: >60 mL/min (ref >60)
Glucose, Bld: 123 mg/dL — ABNORMAL HIGH (ref 70–99)
Potassium: 3.6 mmol/L (ref 3.5–5.1)
Sodium: 134 mmol/L — ABNORMAL LOW (ref 135–145)

## 2021-01-16 LAB — CBC
HCT: 24.8 % — ABNORMAL LOW (ref 39.0–52.0)
Hemoglobin: 8.5 g/dL — ABNORMAL LOW (ref 13.0–17.0)
MCH: 33.2 pg (ref 26.0–34.0)
MCHC: 34.3 g/dL (ref 30.0–36.0)
MCV: 96.9 fL (ref 80.0–100.0)
Platelets: 311 10*3/uL (ref 150–400)
RBC: 2.56 MIL/uL — ABNORMAL LOW (ref 4.22–5.81)
RDW: 18.6 % — ABNORMAL HIGH (ref 11.5–15.5)
WBC: 2.8 10*3/uL — ABNORMAL LOW (ref 4.0–10.5)
nRBC: 0 % (ref 0.0–0.2)

## 2021-01-16 LAB — GLUCOSE, CAPILLARY
Glucose-Capillary: 171 mg/dL — ABNORMAL HIGH (ref 70–99)
Glucose-Capillary: 124 mg/dL — ABNORMAL HIGH (ref 70–99)
Glucose-Capillary: 173 mg/dL — ABNORMAL HIGH (ref 70–99)

## 2021-01-16 MED ORDER — ALBUTEROL SULFATE HFA 108 (90 BASE) MCG/ACT IN AERS
2.0000 | INHALATION_SPRAY | Freq: Four times a day (QID) | RESPIRATORY_TRACT | Status: DC
  Administered 2021-01-16: 11:00:00 2 via RESPIRATORY_TRACT
  Filled 2021-01-16: qty 6.7

## 2021-01-16 MED ORDER — PREDNISONE 20 MG PO TABS: 40.0000 mg | ORAL_TABLET | Freq: Every day | ORAL | 0 refills | Status: AC

## 2021-01-16 NOTE — Progress Notes (Signed)
Received Md order to discharge patient to home.  Reviewed homes meds, discharge instructions, prescriptions and follow up appointments with patient and patient verbalized understanding.

## 2021-01-16 NOTE — Plan of Care (Signed)
°  Problem: Education: Goal: Knowledge of General Education information will improve Description: Including pain rating scale, medication(s)/side effects and non-pharmacologic comfort measures Outcome: Progressing   Problem: Clinical Measurements: Goal: Ability to maintain clinical measurements within normal limits will improve Outcome: Progressing   Problem: Clinical Measurements: Goal: Respiratory complications will improve Outcome: Progressing   Problem: Clinical Measurements: Goal: Cardiovascular complication will be avoided Outcome: Progressing   Problem: Nutrition: Goal: Adequate nutrition will be maintained Outcome: Progressing   Problem: Coping: Goal: Level of anxiety will decrease Outcome: Progressing   Problem: Pain Managment: Goal: General experience of comfort will improve Outcome: Progressing   Problem: Safety: Goal: Ability to remain free from injury will improve Outcome: Progressing

## 2021-01-16 NOTE — Discharge Summary (Signed)
Physician Discharge Summary  CHI GARLOW IPJ:[id number] DOB: 05/04/1965 DOA: 01/12/2021  PCP: Sofie Hartigan, MD  Admit date: 01/12/2021 Discharge date: 01/16/2021  Admitted From: home  Disposition:  home   Recommendations for Outpatient Follow-up:  Follow up with PCP in 1-2 weeks F/u w/ onco/cancer center, Dr. Grayland Ormond, ASAP to get CBC to check H&H  Home Health: no  Equipment/Devices:  Discharge Condition: stable CODE STATUS: full  Diet recommendation: Heart Healthy / Carb Modified / Regular / Dysphagia   Brief/Interim Summary: HPI was taken from Dr. Sheppard Coil: Pleasant 56 year old gentleman with past medical history of IgA deficiency, myelodysplastic syndrome.  Presented to ED at the instruction of oncologist for concern for low hemoglobin, need for emergent transfusion given symptoms of fatigue/lightheadedness.  Patient denies syncope.  Also reports mild cough and sinus congestion for the past few days, positive COVID screening in ED.  Patient reports having had COVID vaccines, has not had by valent booster.  Nuys signs or symptoms of bleeding otherwise, reports normal brown stool, states he does not want another colonoscopy or EGD as he has had this work-up already not terribly long ago.   Hospital course: Presented to ED 01/12/2021, hemoglobin 4.7, baseline recently about 7.5-7.9.  Received 2 units blood and improved hemoglobin to 5.4.  Also noted to have AKI on CKD 3, creatinine 2.59, GFR 29-31, typical GFR 30-53 and occasionally above 60.  Additional units ordered and pending at time of admission Admitted 01/12/2021, plan to continue transfusion/monitoring, given IgA deficiency he needs washed RBCs, given AKI needs repeat labs/monitoring.  Given COVID and mild respiratory symptoms, treating for this as well with remdesivir and dexamethasone along with symptomatic cough control.  CXR obtained, personally reviewed - appears stable from previous though read as CHF, he does have  cardiomegaly and mild LE edema,. Echo 12/23/20 EF 60-65 and Grade II diastolic dysfunction.. Given COVID now uncertain if truly CHF vs respiratory. Ordered BNP to add to labs. Reviewed recent oncology notes, referral is underway to [facility] presumably to discuss possibility for bone marrow transplant.  As per Dr. Ouida Sills: Patrick Duncan is a 56 y.o. male with a PMH significant for IgA deficiency, myelodysplastic syndrome, HTN, type II DM, OSA, AKI. They presented from home to the ED on 01/12/2021 with symptomatic anemia which was detected at outpatient heme/onc appointment with a hgb 4.7. In the ED, they received 2 units pRBCs which improved hgb to 5.4. He endorsed improvement in his symptoms overall Patient was admitted to medicine service for further workup and management of symptomatic anemia as outlined in detail below.   01/14/21 -stable  As per Dr. Jimmye Norman 01/15/21-01/16/21: Pt's Hb on the day of d/c was 8.5. Pt will call the cancer center/Dr. Finnegan's office the day after d/c to determine when he will get repeat CBC to get H&H secondary to MDS.   Discharge Diagnoses:  Principal Problem:   Symptomatic anemia Active Problems:   Insulin dependent type 2 diabetes mellitus (HCC)   HTN (hypertension)   OSA (obstructive sleep apnea)   AKI (acute kidney injury) (HCC)   Leukopenia   IgA deficiency (HCC)   Myelodysplastic syndrome (HCC)   COVID-19   Venous insufficiency   Diastolic dysfunction without heart failure   MDS (myelodysplastic syndrome) (HCC) Symptomatic anemia: secondary to MDS.  S/p 6 units washed RBCs. H&H are 8.5 & 24.8.    IgA deficiency: management per heme/onco as an outpatient    COVID-19: completed remdesivir course. Continue on steroids, bronchodilators and encourage  incentive spirometry    Hyponatremia: resolved    AKI: resolved    Morbid obesity: BMI 50.5. Complicates overall care & prognosis    Discharge Instructions  Discharge Instructions     Diet -  low sodium heart healthy   Complete by: As directed    Diet Carb Modified   Complete by: As directed    Discharge instructions   Complete by: As directed    F/u onoc, [facility], as soon as possible. Call his office/cancer center tomorrow as you will need to get a CBC to check Hb/Hct. F/u w/ PCP in 1-2 weeks   Increase activity slowly   Complete by: As directed       Allergies as of 01/16/2021       Reactions   Ampicillin Diarrhea   Bactrim [sulfamethoxazole-trimethoprim] Hives   Cephalexin    Claritin [loratadine] Other (See Comments)   Irritates throat   Penicillins Rash        Medication List     TAKE these medications    acarbose 25 MG tablet Commonly known as: PRECOSE Take 25 mg by mouth 3 (three) times daily with meals.   amLODipine 10 MG tablet Commonly known as: NORVASC Take 10 mg by mouth daily.   dorzolamide-timolol 22.3-6.8 MG/ML ophthalmic solution Commonly known as: COSOPT Place 1 drop into the right eye 2 (two) times daily.   Farxiga 10 MG Tabs tablet Generic drug: dapagliflozin propanediol Take 10 mg by mouth daily.   fenofibrate 160 MG tablet Take 160 mg by mouth daily.   ferrous sulfate 325 (65 FE) MG EC tablet Take 1 tablet (325 mg total) by mouth 2 (two) times daily.   fexofenadine 180 MG tablet Commonly known as: ALLEGRA Take 180 mg by mouth daily.   fluticasone 50 MCG/ACT nasal spray Commonly known as: FLONASE Place 2 sprays into both nostrils daily as needed.   glimepiride 4 MG tablet Commonly known as: AMARYL Take 4 mg by mouth daily with breakfast.   Hibiclens 4 % external liquid Generic drug: chlorhexidine APPLY TOPICALLY DAILY AS NEEDED. TO WASHLEGS AND GROIN AREA   Hydrocortisone-Aloe 1 % Crea Apply 1 application topically 2 (two) times daily as needed (itching). To itchy area on legs   ketoconazole 2 % cream Commonly known as: NIZORAL Apply to rash in groin once a day as needed for flares.   lidocaine  4 % cream Commonly known as: LMX Apply 1 application topically as needed.   lovastatin 10 MG tablet Commonly known as: MEVACOR Take 10 mg by mouth daily.   metFORMIN 500 MG 24 hr tablet Commonly known as: GLUCOPHAGE-XR Take 500-1,000 mg by mouth 2 (two) times daily. Takes 1 tablet with breakfast and 2 tablets with supper   metoprolol tartrate 50 MG tablet Commonly known as: LOPRESSOR Take 50 mg by mouth 2 (two) times daily.   mupirocin ointment 2 % Commonly known as: BACTROBAN Apply to boils and ulcers QD PRN flares.   omeprazole 20 MG capsule Commonly known as: PRILOSEC Take 20 mg by mouth daily.   pioglitazone 30 MG tablet Commonly known as: ACTOS Take 30 mg by mouth daily.   predniSONE 20 MG tablet Commonly known as: DELTASONE Take 2 tablets (40 mg total) by mouth daily for 5 days.   ramipril 10 MG capsule Commonly known as: ALTACE Take 10 mg by mouth 2 (two) times daily.   spironolactone 50 MG tablet Commonly known as: ALDACTONE Take 50 mg by mouth daily.  terazosin 10 MG capsule Commonly known as: HYTRIN Take 10 mg by mouth daily.   torsemide 20 MG tablet Commonly known as: DEMADEX Take 20 mg by mouth daily.   traZODone 150 MG tablet Commonly known as: DESYREL Take 150 mg by mouth at bedtime.   Tyler Aas FlexTouch 200 UNIT/ML FlexTouch Pen Generic drug: insulin degludec Inject 32 Units into the skin in the morning.   trolamine salicylate 10 % cream Commonly known as: ASPERCREME Apply 1 application topically as needed for muscle pain.   vitamin B-12 1000 MCG tablet Commonly known as: CYANOCOBALAMIN Take 1,000 mcg by mouth daily.        Allergies  Allergen Reactions   Ampicillin Diarrhea   Bactrim [Sulfamethoxazole-Trimethoprim] Hives   Cephalexin    Claritin [Loratadine] Other (See Comments)    Irritates throat   Penicillins Rash    Consultations:    Procedures/Studies: US Venous Img Upper Uni Right(DVT)  Result Date:  12/28/2020 CLINICAL DATA:  Right arm swelling EXAM: RIGHT UPPER EXTREMITY VENOUS DOPPLER ULTRASOUND TECHNIQUE: Gray-scale sonography with graded compression, as well as color Doppler and duplex ultrasound were performed to evaluate the upper extremity deep venous system from the level of the subclavian vein and including the jugular, axillary, basilic, radial, ulnar and upper cephalic vein. Spectral Doppler was utilized to evaluate flow at rest and with distal augmentation maneuvers. COMPARISON:  None. FINDINGS: Contralateral Subclavian Vein: Respiratory phasicity is normal and symmetric with the symptomatic side. No evidence of thrombus. Normal compressibility. Internal Jugular Vein: No evidence of thrombus. Normal compressibility, respiratory phasicity and response to augmentation. Subclavian Vein: No evidence of thrombus. Normal compressibility, respiratory phasicity and response to augmentation. Axillary Vein: No evidence of thrombus. Normal compressibility, respiratory phasicity and response to augmentation. Cephalic Vein: Occlusive thrombus seen in the left cephalic vein from the shoulder to the antecubital region. Basilic Vein: No evidence of thrombus. Normal compressibility, respiratory phasicity and response to augmentation. Brachial Veins: No evidence of thrombus. Normal compressibility, respiratory phasicity and response to augmentation. Radial Veins: No evidence of thrombus. Normal compressibility, respiratory phasicity and response to augmentation. Ulnar Veins: No evidence of thrombus. Normal compressibility, respiratory phasicity and response to augmentation. Venous Reflux:  None visualized. Other Findings:  None visualized. IMPRESSION: Occlusive thrombus in the right upper arm cephalic vein from the shoulder to the elbow. Electronically Signed   By: Rolm Baptise M.D.   On: 12/28/2020 22:44   Portable chest 1 View  Result Date: 01/12/2021 CLINICAL DATA:  Weakness, dyspnea EXAM: PORTABLE CHEST 1  VIEW COMPARISON:  12/14/2020 chest radiograph. FINDINGS: Stable cardiomediastinal silhouette with mild cardiomegaly. No pneumothorax. No pleural effusion. Borderline mild pulmonary edema. No consolidative airspace disease. IMPRESSION: Borderline mild congestive heart failure. Electronically Signed   By: Ilona Sorrel M.D.   On: 01/12/2021 14:36   CT BONE MARROW BIOPSY & ASPIRATION  Result Date: 01/03/2021 INDICATION: Anemia EXAM: CT BONE MARROW BIOPSY AND ASPIRATION MEDICATIONS: None. ANESTHESIA/SEDATION: Moderate (conscious) sedation was employed during this procedure. A total of Versed 0.5 mg and Fentanyl 50 mcg was administered intravenously. Moderate Sedation Time: 13 minutes. The patient's level of consciousness and vital signs were monitored continuously by radiology nursing throughout the procedure under my direct supervision. FLUOROSCOPY TIME:  N/a COMPLICATIONS: None immediate. PROCEDURE: Informed written consent was obtained from the patient after a thorough discussion of the procedural risks, benefits and alternatives. All questions were addressed. Maximal Sterile Barrier Technique was utilized including caps, mask, sterile gowns, sterile gloves, sterile drape, hand hygiene and skin  antiseptic. A timeout was performed prior to the initiation of the procedure. The patient was placed prone on the CT exam table. Limited CT of the pelvis was performed for planning purposes. Skin entry site was marked, and the overlying skin was prepped and draped in the standard sterile fashion. Local analgesia was obtained with 1% lidocaine. Using CT guidance, an 11 gauge needle was advanced just deep to the cortex of the right posterior ilium. Subsequently, bone marrow aspiration and core biopsy were performed. Specimens were submitted to lab/pathology for handling. Hemostasis was achieved with manual pressure, and a clean dressing was placed. The patient tolerated the procedure well without immediate complication.  IMPRESSION: Successful CT-guided bone marrow aspiration and core biopsy of the right posterior ilium. Electronically Signed   By: Albin Felling M.D.   On: 01/03/2021 14:25   ECHOCARDIOGRAM COMPLETE  Result Date: 12/23/2020    ECHOCARDIOGRAM REPORT   Patient Name:   Patrick Duncan Date of Exam: 12/23/2020 Medical Rec #:  [id number]         Height:       67.0 in Accession #:    [id number]        Weight:       349.0 lb Date of Birth:  November 26, 1965        BSA:          2.562 m Patient Age:    33 years          BP:           129/44 mmHg Patient Gender: M                 HR:           82 bpm. Exam Location:  [facility] Procedure: 2D Echo, Cardiac Doppler and Color Doppler Indications:     Pericardial Effusion I31.3  History:         Patient has no prior history of Echocardiogram examinations.                  Risk Factors:Diabetes, Hypertension, Dyslipidemia and Sleep                  Apnea.  Sonographer:     Sherrie Sport Referring Phys:  [id number] [facility] AMIN Diagnosing Phys: Ida Rogue MD  Sonographer Comments: Suboptimal apical window and no subcostal window. IMPRESSIONS  1. Left ventricular ejection fraction, by estimation, is 60 to 65%. The left ventricle has normal function. The left ventricle has no regional wall motion abnormalities. There is moderate left ventricular hypertrophy. Left ventricular diastolic parameters are consistent with Grade II diastolic dysfunction (pseudonormalization).  2. Right ventricular systolic function is normal. The right ventricular size is normal. There is normal pulmonary artery systolic pressure. The estimated right ventricular systolic pressure is 06.2 mmHg.  3. Left atrial size was mildly dilated.  4. A small to moderate noncircumferential pericardial effusion is present.No tamponade.  5. The mitral valve is normal in structure. No evidence of mitral valve regurgitation. No evidence of mitral stenosis.  6. The aortic valve was not well visualized. Aortic valve regurgitation is  not visualized. No aortic stenosis is present.  7. The inferior vena cava is normal in size with greater than 50% respiratory variability, suggesting right atrial pressure of 3 mmHg. FINDINGS  Left Ventricle: Left ventricular ejection fraction, by estimation, is 60 to 65%. The left ventricle has normal function. The left ventricle has no regional wall motion abnormalities. The left ventricular internal cavity size  was normal in size. There is  moderate left ventricular hypertrophy. Left ventricular diastolic parameters are consistent with Grade II diastolic dysfunction (pseudonormalization). Right Ventricle: The right ventricular size is normal. No increase in right ventricular wall thickness. Right ventricular systolic function is normal. There is normal pulmonary artery systolic pressure. The tricuspid regurgitant velocity is 1.59 m/s, and  with an assumed right atrial pressure of 5 mmHg, the estimated right ventricular systolic pressure is 16.5 mmHg. Left Atrium: Left atrial size was mildly dilated. Right Atrium: Right atrial size was normal in size. Pericardium: A small pericardial effusion is present. There is no evidence of cardiac tamponade. Mitral Valve: The mitral valve is normal in structure. No evidence of mitral valve regurgitation. No evidence of mitral valve stenosis. MV peak gradient, 5.7 mmHg. The mean mitral valve gradient is 3.0 mmHg. Tricuspid Valve: The tricuspid valve is normal in structure. Tricuspid valve regurgitation is mild . No evidence of tricuspid stenosis. Aortic Valve: The aortic valve was not well visualized. Aortic valve regurgitation is not visualized. No aortic stenosis is present. Aortic valve mean gradient measures 4.0 mmHg. Aortic valve peak gradient measures 7.4 mmHg. Aortic valve area, by VTI measures 4.99 cm. Pulmonic Valve: The pulmonic valve was normal in structure. Pulmonic valve regurgitation is not visualized. No evidence of pulmonic stenosis. Aorta: The aortic root is  normal in size and structure. Venous: The inferior vena cava is normal in size with greater than 50% respiratory variability, suggesting right atrial pressure of 3 mmHg. IAS/Shunts: No atrial level shunt detected by color flow Doppler.  LEFT VENTRICLE PLAX 2D LVIDd:         5.70 cm   Diastology LVIDs:         3.50 cm   LV e' medial:    6.64 cm/s LV PW:         1.40 cm   LV E/e' medial:  18.4 LV IVS:        1.20 cm   LV e' lateral:   10.30 cm/s LVOT diam:     2.20 cm   LV E/e' lateral: 11.8 LV SV:         111 LV SV Index:   43 LVOT Area:     3.80 cm  RIGHT VENTRICLE RV Basal diam:  5.00 cm RV S prime:     14.90 cm/s TAPSE (M-mode): 5.2 cm LEFT ATRIUM              Index        RIGHT ATRIUM           Index LA diam:        4.70 cm  1.83 cm/m   RA Area:     46.30 cm LA Vol (A2C):   126.0 ml 49.18 ml/m  RA Volume:   221.00 ml 86.26 ml/m LA Vol (A4C):   105.0 ml 40.98 ml/m LA Biplane Vol: 118.0 ml 46.06 ml/m  AORTIC VALVE                    PULMONIC VALVE AV Area (Vmax):    4.64 cm     PV Vmax:        1.06 m/s AV Area (Vmean):   3.99 cm     PV Vmean:       82.200 cm/s AV Area (VTI):     4.99 cm     PV VTI:         0.204 m AV Vmax:  136.00 cm/s  PV Peak grad:   4.5 mmHg AV Vmean:          92.900 cm/s  PV Mean grad:   3.0 mmHg AV VTI:            0.222 m      RVOT Peak grad: 7 mmHg AV Peak Grad:      7.4 mmHg AV Mean Grad:      4.0 mmHg LVOT Vmax:         166.00 cm/s LVOT Vmean:        97.500 cm/s LVOT VTI:          0.291 m LVOT/AV VTI ratio: 1.31  AORTA Ao Root diam: 3.33 cm MITRAL VALVE                TRICUSPID VALVE MV Area (PHT): 3.33 cm     TR Peak grad:   10.1 mmHg MV Area VTI:   3.76 cm     TR Vmax:        159.00 cm/s MV Peak grad:  5.7 mmHg MV Mean grad:  3.0 mmHg     SHUNTS MV Vmax:       1.19 m/s     Systemic VTI:  0.29 m MV Vmean:      82.0 cm/s    Systemic Diam: 2.20 cm MV Decel Time: 228 msec     Pulmonic VTI:  0.260 m MV E velocity: 122.00 cm/s MV A velocity: 121.00 cm/s MV E/A ratio:  1.01  Ida Rogue MD Electronically signed by Ida Rogue MD Signature Date/Time: 12/23/2020/10:15:34 AM    Final    (Echo, Carotid, EGD, Colonoscopy, ERCP)    Subjective: Pt c/o fatigue   Discharge Exam: Vitals:   01/16/21 1049 01/16/21 1149  BP: 132/60 (!) 139/50  Pulse:  68  Resp:  16  Temp:  98.3 F (36.8 C)  SpO2:  99%   Vitals:   01/16/21 0409 01/16/21 0914 01/16/21 1049 01/16/21 1149  BP: (!) 129/59 (!) 120/56 132/60 (!) 139/50  Pulse: 66 69  68  Resp: 16 18  16   Temp: (!) 97.3 F (36.3 C) 97.6 F (36.4 C)  98.3 F (36.8 C)  TempSrc: Oral Oral  Oral  SpO2: 94% 98%  99%  Weight:      Height:        General: Pt is alert, awake, not in acute distress Cardiovascular: S1/S2 +, no rubs, no gallops Respiratory: diminished breath sounds b/l  Abdominal: Soft, NT, obese, bowel sounds + Extremities:  no cyanosis    The results of significant diagnostics from this hospitalization (including imaging, microbiology, ancillary and laboratory) are listed below for reference.     Microbiology: Recent Results (from the past 240 hour(s))  Resp Panel by RT-PCR (Flu A&B, Covid) Nasopharyngeal Swab     Status: Abnormal   Collection Time: 01/12/21 12:24 PM   Specimen: Nasopharyngeal Swab; Nasopharyngeal(NP) swabs in vial transport medium  Result Value Ref Range Status   SARS Coronavirus 2 by RT PCR POSITIVE (A) NEGATIVE Final    Comment: (NOTE) SARS-CoV-2 target nucleic acids are DETECTED.  The SARS-CoV-2 RNA is generally detectable in upper respiratory specimens during the acute phase of infection. Positive results are indicative of the presence of the identified virus, but do not rule out bacterial infection or co-infection with other pathogens not detected by the test. Clinical correlation with patient history and other diagnostic information is necessary to determine patient infection status. The expected result  is Negative.  Fact Sheet for  Patients: EntrepreneurPulse.com.au  Fact Sheet for Healthcare Providers: IncredibleEmployment.be  This test is not yet approved or cleared by the [place] FDA and  has been authorized for detection and/or diagnosis of SARS-CoV-2 by FDA under an Emergency Use Authorization (EUA).  This EUA will remain in effect (meaning this test can be used) for the duration of  the COVID-19 declaration under Section 564(b)(1) of the A ct, 21 U.S.C. section 360bbb-3(b)(1), unless the authorization is terminated or revoked sooner.     Influenza A by PCR NEGATIVE NEGATIVE Final   Influenza B by PCR NEGATIVE NEGATIVE Final    Comment: (NOTE) The Xpert Xpress SARS-CoV-2/FLU/RSV plus assay is intended as an aid in the diagnosis of influenza from Nasopharyngeal swab specimens and should not be used as a sole basis for treatment. Nasal washings and aspirates are unacceptable for Xpert Xpress SARS-CoV-2/FLU/RSV testing.  Fact Sheet for Patients: EntrepreneurPulse.com.au  Fact Sheet for Healthcare Providers: IncredibleEmployment.be  This test is not yet approved or cleared by the [place] FDA and has been authorized for detection and/or diagnosis of SARS-CoV-2 by FDA under an Emergency Use Authorization (EUA). This EUA will remain in effect (meaning this test can be used) for the duration of the COVID-19 declaration under Section 564(b)(1) of the Act, 21 U.S.C. section 360bbb-3(b)(1), unless the authorization is terminated or revoked.  Performed at [facility], [address] [place], [place] [address]      Labs: BNP (last 3 results) Recent Labs    01/12/21 1743  BNP 035.4*   Basic Metabolic Panel: Recent Labs  Lab 01/12/21 1139 01/13/21 0914 01/14/21 0609 01/15/21 0623 01/16/21 0755  NA 135 133* 137 135 134*  K 4.2 3.6 3.7 3.8 3.6  CL 102 103 107 105 105  CO2 23 23 23 23 24   GLUCOSE 110*  137* 99 144* 123*  BUN 67* 47* 34* 29* 27*  CREATININE 2.43* 1.46* 1.14 1.08 0.99  CALCIUM 8.8* 8.3* 8.4* 8.5* 8.9   Liver Function Tests: Recent Labs  Lab 01/12/21 0834 01/12/21 1139 01/13/21 0914  AST 28 25 21   ALT 22 23 22   ALKPHOS 38 39 34*  BILITOT 1.2 1.2 1.2  PROT 7.0 6.9 6.9  ALBUMIN 3.4* 3.5 3.5   No results for input(s): LIPASE, AMYLASE in the last 168 hours. No results for input(s): AMMONIA in the last 168 hours. CBC: Recent Labs  Lab 01/12/21 0834 01/12/21 1139 01/13/21 0914 01/14/21 0609 01/15/21 0623 01/16/21 0755  WBC 3.5* 2.9* 2.5* 2.7* 3.6* 2.8*  NEUTROABS 2.9 2.4 1.9  --   --   --   HGB 4.7* 5.4* 6.3* 7.7* 8.9* 8.5*  HCT 14.2* 15.8* 18.7* 22.7* 26.0* 24.8*  MCV 100.7* 102.6* 97.4 94.6 96.3 96.9  PLT 346 397 373 365 416* 311   Cardiac Enzymes: No results for input(s): CKTOTAL, CKMB, CKMBINDEX, TROPONINI in the last 168 hours. BNP: Invalid input(s): POCBNP CBG: Recent Labs  Lab 01/15/21 1149 01/15/21 1612 01/16/21 0411 01/16/21 0916 01/16/21 1146  GLUCAP 149* 160* 171* 124* 173*   D-Dimer No results for input(s): DDIMER in the last 72 hours. Hgb A1c No results for input(s): HGBA1C in the last 72 hours. Lipid Profile No results for input(s): CHOL, HDL, LDLCALC, TRIG, CHOLHDL, LDLDIRECT in the last 72 hours. Thyroid function studies No results for input(s): TSH, T4TOTAL, T3FREE, THYROIDAB in the last 72 hours.  Invalid input(s): FREET3 Anemia work up No results for input(s): VITAMINB12, FOLATE, FERRITIN,  TIBC, IRON, RETICCTPCT in the last 72 hours. Urinalysis    Component Value Date/Time   COLORURINE YELLOW 12/22/2020 1500   APPEARANCEUR CLEAR (A) 12/22/2020 1500   LABSPEC 1.015 12/22/2020 1500   PHURINE 7.0 12/22/2020 1500   GLUCOSEU 500 (A) 12/22/2020 1500   HGBUR NEGATIVE 12/22/2020 1500   BILIRUBINUR NEGATIVE 12/22/2020 1500   KETONESUR 15 (A) 12/22/2020 1500   PROTEINUR NEGATIVE 12/22/2020 1500   NITRITE NEGATIVE 12/22/2020  1500   LEUKOCYTESUR NEGATIVE 12/22/2020 1500   Sepsis Labs Invalid input(s): PROCALCITONIN,  WBC,  LACTICIDVEN Microbiology Recent Results (from the past 240 hour(s))  Resp Panel by RT-PCR (Flu A&B, Covid) Nasopharyngeal Swab     Status: Abnormal   Collection Time: 01/12/21 12:24 PM   Specimen: Nasopharyngeal Swab; Nasopharyngeal(NP) swabs in vial transport medium  Result Value Ref Range Status   SARS Coronavirus 2 by RT PCR POSITIVE (A) NEGATIVE Final    Comment: (NOTE) SARS-CoV-2 target nucleic acids are DETECTED.  The SARS-CoV-2 RNA is generally detectable in upper respiratory specimens during the acute phase of infection. Positive results are indicative of the presence of the identified virus, but do not rule out bacterial infection or co-infection with other pathogens not detected by the test. Clinical correlation with patient history and other diagnostic information is necessary to determine patient infection status. The expected result is Negative.  Fact Sheet for Patients: EntrepreneurPulse.com.au  Fact Sheet for Healthcare Providers: [employer]  This test is not yet approved or cleared by the [place] FDA and  has been authorized for detection and/or diagnosis of SARS-CoV-2 by FDA under an Emergency Use Authorization (EUA).  This EUA will remain in effect (meaning this test can be used) for the duration of  the COVID-19 declaration under Section 564(b)(1) of the A ct, 21 U.S.C. section 360bbb-3(b)(1), unless the authorization is terminated or revoked sooner.     Influenza A by PCR NEGATIVE NEGATIVE Final   Influenza B by PCR NEGATIVE NEGATIVE Final    Comment: (NOTE) The Xpert Xpress SARS-CoV-2/FLU/RSV plus assay is intended as an aid in the diagnosis of influenza from Nasopharyngeal swab specimens and should not be used as a sole basis for treatment. Nasal washings and aspirates are unacceptable for Xpert  Xpress SARS-CoV-2/FLU/RSV testing.  Fact Sheet for Patients: EntrepreneurPulse.com.au  Fact Sheet for Healthcare Providers: [employer]  This test is not yet approved or cleared by the [place] FDA and has been authorized for detection and/or diagnosis of SARS-CoV-2 by FDA under an Emergency Use Authorization (EUA). This EUA will remain in effect (meaning this test can be used) for the duration of the COVID-19 declaration under Section 564(b)(1) of the Act, 21 U.S.C. section 360bbb-3(b)(1), unless the authorization is terminated or revoked.  Performed at [facility], [address], [place], [place] [address]      Time coordinating discharge: Over 30 minutes  SIGNED:   Wyvonnia Dusky, MD  [facility] 01/16/2021, 11:56 AM Pager   If 7PM-7AM, please contact night-coverage

## 2021-01-18 ENCOUNTER — Telehealth: Payer: Self-pay | Admitting: Oncology

## 2021-01-18 NOTE — Telephone Encounter (Signed)
Pt called for a hospital follow up. Call back at [phone]

## 2021-01-20 ENCOUNTER — Inpatient Hospital Stay: Payer: Medicare HMO | Attending: Oncology

## 2021-01-20 ENCOUNTER — Other Ambulatory Visit: Payer: Self-pay

## 2021-01-20 ENCOUNTER — Inpatient Hospital Stay (HOSPITAL_BASED_OUTPATIENT_CLINIC_OR_DEPARTMENT_OTHER): Payer: Medicare HMO | Admitting: Oncology

## 2021-01-20 ENCOUNTER — Encounter: Payer: Self-pay | Admitting: Oncology

## 2021-01-20 VITALS — BP 132/52 | HR 78 | Temp 98.2°F | Resp 20

## 2021-01-20 DIAGNOSIS — E876 Hypokalemia: Secondary | ICD-10-CM | POA: Insufficient documentation

## 2021-01-20 DIAGNOSIS — D62 Acute posthemorrhagic anemia: Secondary | ICD-10-CM

## 2021-01-20 DIAGNOSIS — D649 Anemia, unspecified: Secondary | ICD-10-CM | POA: Diagnosis not present

## 2021-01-20 DIAGNOSIS — D72819 Decreased white blood cell count, unspecified: Secondary | ICD-10-CM | POA: Insufficient documentation

## 2021-01-20 DIAGNOSIS — D469 Myelodysplastic syndrome, unspecified: Secondary | ICD-10-CM | POA: Diagnosis not present

## 2021-01-20 DIAGNOSIS — D802 Selective deficiency of immunoglobulin A [IgA]: Secondary | ICD-10-CM | POA: Diagnosis not present

## 2021-01-20 LAB — COMPREHENSIVE METABOLIC PANEL
ALT: 13 U/L (ref 0–44)
AST: 14 U/L — ABNORMAL LOW (ref 15–41)
Albumin: 3.4 g/dL — ABNORMAL LOW (ref 3.5–5.0)
Alkaline Phosphatase: 36 U/L — ABNORMAL LOW (ref 38–126)
Anion gap: 11 (ref 5–15)
BUN: 41 mg/dL — ABNORMAL HIGH (ref 6–20)
CO2: 26 mmol/L (ref 22–32)
Calcium: 8.8 mg/dL — ABNORMAL LOW (ref 8.9–10.3)
Chloride: 99 mmol/L (ref 98–111)
Creatinine, Ser: 1.12 mg/dL (ref 0.61–1.24)
GFR, Estimated: >60 mL/min (ref >60)
Glucose, Bld: 106 mg/dL — ABNORMAL HIGH (ref 70–99)
Potassium: 3.3 mmol/L — ABNORMAL LOW (ref 3.5–5.1)
Sodium: 136 mmol/L (ref 135–145)
Total Bilirubin: 1 mg/dL (ref 0.3–1.2)
Total Protein: 6.8 g/dL (ref 6.5–8.1)

## 2021-01-20 LAB — CBC WITH DIFFERENTIAL/PLATELET
Abs Immature Granulocytes: 0.02 10*3/uL (ref 0.00–0.07)
Basophils Absolute: 0 10*3/uL (ref 0.0–0.1)
Basophils Relative: 0 %
Eosinophils Absolute: 0 10*3/uL (ref 0.0–0.5)
Eosinophils Relative: 0 %
HCT: 24.1 % — ABNORMAL LOW (ref 39.0–52.0)
Hemoglobin: 8.2 g/dL — ABNORMAL LOW (ref 13.0–17.0)
Immature Granulocytes: 1 %
Lymphocytes Relative: 9 %
Lymphs Abs: 0.3 10*3/uL — ABNORMAL LOW (ref 0.7–4.0)
MCH: 33.2 pg (ref 26.0–34.0)
MCHC: 34 g/dL (ref 30.0–36.0)
MCV: 97.6 fL (ref 80.0–100.0)
Monocytes Absolute: 0.4 10*3/uL (ref 0.1–1.0)
Monocytes Relative: 12 %
Neutro Abs: 2.4 10*3/uL (ref 1.7–7.7)
Neutrophils Relative %: 78 %
Platelets: 205 10*3/uL (ref 150–400)
RBC: 2.47 MIL/uL — ABNORMAL LOW (ref 4.22–5.81)
RDW: 17.4 % — ABNORMAL HIGH (ref 11.5–15.5)
WBC: 3.1 10*3/uL — ABNORMAL LOW (ref 4.0–10.5)
nRBC: 0 % (ref 0.0–0.2)

## 2021-01-20 LAB — SAMPLE TO BLOOD BANK

## 2021-01-20 NOTE — Progress Notes (Signed)
Patient here for follow up he reports a persistent cough non productive, dyspnea with exertion.

## 2021-01-21 NOTE — Progress Notes (Signed)
[place]  Telephone:([phone]) [phone] Fax:(336) [phone]  ID: Dorathy Kinsman OB: [birth year]  MR#: [id number]  KNL#:[id number]  Patient Care Team: Sofie Hartigan, MD as PCP - General (Family Medicine) Lloyd Huger, MD as Consulting Physician (Oncology)  CHIEF COMPLAINT: IgA deficiency and MDS.  INTERVAL HISTORY: Patient returns to clinic today for hospital follow-up and repeat laboratory work.  He continues to have weakness and fatigue, but this is improved.  His cough has improved as well.  He has no neurologic complaints.  He denies any recent fevers.  He has a fair appetite, but denies weight loss.  He has no chest pain, shortness of breath, or hemoptysis.  He denies any nausea, vomiting, constipation, or diarrhea.  He has no further melena or hematochezia.  He has no urinary complaints.  Patient offers no further specific complaints today.  REVIEW OF SYSTEMS:   Review of Systems  Constitutional:  Positive for malaise/fatigue. Negative for fever and weight loss.  Respiratory:  Positive for cough. Negative for hemoptysis and shortness of breath.   Cardiovascular: Negative.  Negative for chest pain and leg swelling.  Gastrointestinal: Negative.  Negative for abdominal pain, blood in stool and melena.  Genitourinary: Negative.  Negative for hematuria.  Musculoskeletal: Negative.  Negative for back pain.  Skin: Negative.  Negative for rash.  Neurological:  Positive for weakness. Negative for dizziness, focal weakness and headaches.  Psychiatric/Behavioral: Negative.  The patient is not nervous/anxious.    As per HPI. Otherwise, a complete review of systems is negative.  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[place])    Morbid  obesity ([place])    Sleep apnea     PAST SURGICAL HISTORY: Past Surgical History:  Procedure Laterality Date   COLONOSCOPY WITH PROPOFOL N/A 07/13/2020   Procedure: COLONOSCOPY WITH PROPOFOL;  Surgeon: Lesly Rubenstein, MD;  Location: [facility] ENDOSCOPY;  Service: Endoscopy;  Laterality: N/A;   COLONOSCOPY WITH PROPOFOL N/A 12/23/2020   Procedure: COLONOSCOPY WITH PROPOFOL;  Surgeon: Lucilla Lame, MD;  Location: [facility] ENDOSCOPY;  Service: Endoscopy;  Laterality: N/A;   ESOPHAGOGASTRODUODENOSCOPY (EGD) WITH PROPOFOL N/A 12/23/2020   Procedure: ESOPHAGOGASTRODUODENOSCOPY (EGD) WITH PROPOFOL;  Surgeon: Lucilla Lame, MD;  Location: [facility] ENDOSCOPY;  Service: Endoscopy;  Laterality: N/A;   GIVENS CAPSULE STUDY N/A 12/29/2020   Procedure: GIVENS CAPSULE STUDY;  Surgeon: Lin Landsman, MD;  Location: [facility] ENDOSCOPY;  Service: Gastroenterology;  Laterality: N/A;   TOOTH EXTRACTION      FAMILY HISTORY: Family History  Problem Relation Age of Onset   Benign prostatic hyperplasia Father    Psoriasis Father    Hypertension Mother    Hyperlipidemia Mother    Diabetes Maternal Grandmother    Diabetes Paternal Grandmother     ADVANCED DIRECTIVES (Y/N):  N  HEALTH MAINTENANCE: Social History   Tobacco Use   Smoking status: Never   Smokeless tobacco: Never  Vaping Use   Vaping Use: Never used  Substance Use Topics   Alcohol use: No   Drug use: No     Colonoscopy:  PAP:  Bone density:  Lipid panel:  Allergies  Allergen Reactions   Ampicillin Diarrhea   Bactrim [Sulfamethoxazole-Trimethoprim] Hives   Cephalexin  Claritin [Loratadine] Other (See Comments)    Irritates throat   Penicillins Rash    Current Outpatient Medications  Medication Sig Dispense Refill   acarbose (PRECOSE) 25 MG tablet Take 25 mg by mouth 3 (three) times daily with meals.     amLODipine (NORVASC) 10 MG tablet Take 10 mg by mouth daily.     dorzolamide-timolol (COSOPT) 22.3-6.8 MG/ML ophthalmic solution  Place 1 drop into the right eye 2 (two) times daily.     FARXIGA 10 MG TABS tablet Take 10 mg by mouth daily.     fenofibrate 160 MG tablet Take 160 mg by mouth daily.     ferrous sulfate 325 (65 FE) MG EC tablet Take 1 tablet (325 mg total) by mouth 2 (two) times daily. 60 tablet 3   fexofenadine (ALLEGRA) 180 MG tablet Take 180 mg by mouth daily.     fluticasone (FLONASE) 50 MCG/ACT nasal spray Place 2 sprays into both nostrils daily as needed.     glimepiride (AMARYL) 4 MG tablet Take 4 mg by mouth daily with breakfast.     HIBICLENS 4 % external liquid APPLY TOPICALLY DAILY AS NEEDED. TO WASHLEGS AND GROIN AREA 120 mL 3   Hydrocortisone-Aloe 1 % CREA Apply 1 application topically 2 (two) times daily as needed (itching). To itchy area on legs     ketoconazole (NIZORAL) 2 % cream Apply to rash in groin once a day as needed for flares. 60 g 5   lidocaine (LMX) 4 % cream Apply 1 application topically as needed.     lovastatin (MEVACOR) 10 MG tablet Take 10 mg by mouth daily.     metFORMIN (GLUCOPHAGE-XR) 500 MG 24 hr tablet Take 500-1,000 mg by mouth 2 (two) times daily. Takes 1 tablet with breakfast and 2 tablets with supper     metoprolol tartrate (LOPRESSOR) 50 MG tablet Take 50 mg by mouth 2 (two) times daily.     mupirocin ointment (BACTROBAN) 2 % Apply to boils and ulcers QD PRN flares. 22 g 3   omeprazole (PRILOSEC) 20 MG capsule Take 20 mg by mouth daily.     pioglitazone (ACTOS) 30 MG tablet Take 30 mg by mouth daily.     predniSONE (DELTASONE) 20 MG tablet Take 2 tablets (40 mg total) by mouth daily for 5 days. 10 tablet 0   ramipril (ALTACE) 10 MG capsule Take 10 mg by mouth 2 (two) times daily.     spironolactone (ALDACTONE) 50 MG tablet Take 50 mg by mouth daily.     terazosin (HYTRIN) 10 MG capsule Take 10 mg by mouth daily.      torsemide (DEMADEX) 20 MG tablet Take 20 mg by mouth daily.     traZODone (DESYREL) 150 MG tablet Take 150 mg by mouth at bedtime.     TRESIBA FLEXTOUCH  200 UNIT/ML FlexTouch Pen Inject 32 Units into the skin in the morning.     trolamine salicylate (ASPERCREME) 10 % cream Apply 1 application topically as needed for muscle pain.     vitamin B-12 (CYANOCOBALAMIN) 1000 MCG tablet Take 1,000 mcg by mouth daily.     No current facility-administered medications for this visit.    OBJECTIVE: Vitals:   01/20/21 1501  BP: (!) 132/52  Pulse: 78  Resp: 20  Temp: 98.2 F (36.8 C)  SpO2: 90%     There is no height or weight on file to calculate BMI.    ECOG FS:1 - Symptomatic but completely ambulatory  General: Well-developed, well-nourished, no acute distress.  Sitting in a wheelchair. Eyes: Pink conjunctiva, anicteric sclera. HEENT: Normocephalic, moist mucous membranes. Lungs: No audible wheezing or coughing. Heart: Regular rate and rhythm. Abdomen: Soft, nontender, no obvious distention. Musculoskeletal: No edema, cyanosis, or clubbing. Neuro: Alert, answering all questions appropriately. Cranial nerves grossly intact. Skin: No rashes or petechiae noted. Psych: Normal affect.   LAB RESULTS:  Lab Results  Component Value Date   NA 136 01/20/2021   K 3.3 (L) 01/20/2021   CL 99 01/20/2021   CO2 26 01/20/2021   GLUCOSE 106 (H) 01/20/2021   BUN 41 (H) 01/20/2021   CREATININE 1.12 01/20/2021   CALCIUM 8.8 (L) 01/20/2021   PROT 6.8 01/20/2021   ALBUMIN 3.4 (L) 01/20/2021   AST 14 (L) 01/20/2021   ALT 13 01/20/2021   ALKPHOS 36 (L) 01/20/2021   BILITOT 1.0 01/20/2021   GFRNONAA >60 01/20/2021   GFRAA >60 10/25/2016    Lab Results  Component Value Date   WBC 3.1 (L) 01/20/2021   NEUTROABS 2.4 01/20/2021   HGB 8.2 (L) 01/20/2021   HCT 24.1 (L) 01/20/2021   MCV 97.6 01/20/2021   PLT 205 01/20/2021     STUDIES: US Venous Img Upper Uni Right(DVT)  Result Date: 12/28/2020 CLINICAL DATA:  Right arm swelling EXAM: RIGHT UPPER EXTREMITY VENOUS DOPPLER ULTRASOUND TECHNIQUE: Gray-scale sonography with graded compression, as  well as color Doppler and duplex ultrasound were performed to evaluate the upper extremity deep venous system from the level of the subclavian vein and including the jugular, axillary, basilic, radial, ulnar and upper cephalic vein. Spectral Doppler was utilized to evaluate flow at rest and with distal augmentation maneuvers. COMPARISON:  None. FINDINGS: Contralateral Subclavian Vein: Respiratory phasicity is normal and symmetric with the symptomatic side. No evidence of thrombus. Normal compressibility. Internal Jugular Vein: No evidence of thrombus. Normal compressibility, respiratory phasicity and response to augmentation. Subclavian Vein: No evidence of thrombus. Normal compressibility, respiratory phasicity and response to augmentation. Axillary Vein: No evidence of thrombus. Normal compressibility, respiratory phasicity and response to augmentation. Cephalic Vein: Occlusive thrombus seen in the left cephalic vein from the shoulder to the antecubital region. Basilic Vein: No evidence of thrombus. Normal compressibility, respiratory phasicity and response to augmentation. Brachial Veins: No evidence of thrombus. Normal compressibility, respiratory phasicity and response to augmentation. Radial Veins: No evidence of thrombus. Normal compressibility, respiratory phasicity and response to augmentation. Ulnar Veins: No evidence of thrombus. Normal compressibility, respiratory phasicity and response to augmentation. Venous Reflux:  None visualized. Other Findings:  None visualized. IMPRESSION: Occlusive thrombus in the right upper arm cephalic vein from the shoulder to the elbow. Electronically Signed   By: Rolm Baptise M.D.   On: 12/28/2020 22:44   Portable chest 1 View  Result Date: 01/12/2021 CLINICAL DATA:  Weakness, dyspnea EXAM: PORTABLE CHEST 1 VIEW COMPARISON:  12/14/2020 chest radiograph. FINDINGS: Stable cardiomediastinal silhouette with mild cardiomegaly. No pneumothorax. No pleural effusion.  Borderline mild pulmonary edema. No consolidative airspace disease. IMPRESSION: Borderline mild congestive heart failure. Electronically Signed   By: Ilona Sorrel M.D.   On: 01/12/2021 14:36   CT BONE MARROW BIOPSY & ASPIRATION  Result Date: 01/03/2021 INDICATION: Anemia EXAM: CT BONE MARROW BIOPSY AND ASPIRATION MEDICATIONS: None. ANESTHESIA/SEDATION: Moderate (conscious) sedation was employed during this procedure. A total of Versed 0.5 mg and Fentanyl 50 mcg was administered intravenously. Moderate Sedation Time: 13 minutes. The patient's level of consciousness and vital signs were monitored continuously by radiology nursing throughout the  procedure under my direct supervision. FLUOROSCOPY TIME:  N/a COMPLICATIONS: None immediate. PROCEDURE: Informed written consent was obtained from the patient after a thorough discussion of the procedural risks, benefits and alternatives. All questions were addressed. Maximal Sterile Barrier Technique was utilized including caps, mask, sterile gowns, sterile gloves, sterile drape, hand hygiene and skin antiseptic. A timeout was performed prior to the initiation of the procedure. The patient was placed prone on the CT exam table. Limited CT of the pelvis was performed for planning purposes. Skin entry site was marked, and the overlying skin was prepped and draped in the standard sterile fashion. Local analgesia was obtained with 1% lidocaine. Using CT guidance, an 11 gauge needle was advanced just deep to the cortex of the right posterior ilium. Subsequently, bone marrow aspiration and core biopsy were performed. Specimens were submitted to lab/pathology for handling. Hemostasis was achieved with manual pressure, and a clean dressing was placed. The patient tolerated the procedure well without immediate complication. IMPRESSION: Successful CT-guided bone marrow aspiration and core biopsy of the right posterior ilium. Electronically Signed   By: Albin Felling M.D.   On:  01/03/2021 14:25   ECHOCARDIOGRAM COMPLETE  Result Date: 12/23/2020    ECHOCARDIOGRAM REPORT   Patient Name:   Patrick Duncan Date of Exam: 12/23/2020 Medical Rec #:  [id number]         Height:       67.0 in Accession #:    [id number]        Weight:       349.0 lb Date of Birth:  1965/04/03        BSA:          2.562 m Patient Age:    56 years          BP:           129/44 mmHg Patient Gender: M                 HR:           82 bpm. Exam Location:  [facility] Procedure: 2D Echo, Cardiac Doppler and Color Doppler Indications:     Pericardial Effusion I31.3  History:         Patient has no prior history of Echocardiogram examinations.                  Risk Factors:Diabetes, Hypertension, Dyslipidemia and Sleep                  Apnea.  Sonographer:     Sherrie Sport Referring Phys:  [id number] [facility] AMIN Diagnosing Phys: Ida Rogue MD  Sonographer Comments: Suboptimal apical window and no subcostal window. IMPRESSIONS  1. Left ventricular ejection fraction, by estimation, is 60 to 65%. The left ventricle has normal function. The left ventricle has no regional wall motion abnormalities. There is moderate left ventricular hypertrophy. Left ventricular diastolic parameters are consistent with Grade II diastolic dysfunction (pseudonormalization).  2. Right ventricular systolic function is normal. The right ventricular size is normal. There is normal pulmonary artery systolic pressure. The estimated right ventricular systolic pressure is 53.6 mmHg.  3. Left atrial size was mildly dilated.  4. A small to moderate noncircumferential pericardial effusion is present.No tamponade.  5. The mitral valve is normal in structure. No evidence of mitral valve regurgitation. No evidence of mitral stenosis.  6. The aortic valve was not well visualized. Aortic valve regurgitation is not visualized. No aortic stenosis is present.  7. The inferior  vena cava is normal in size with greater than 50% respiratory variability, suggesting right  atrial pressure of 3 mmHg. FINDINGS  Left Ventricle: Left ventricular ejection fraction, by estimation, is 60 to 65%. The left ventricle has normal function. The left ventricle has no regional wall motion abnormalities. The left ventricular internal cavity size was normal in size. There is  moderate left ventricular hypertrophy. Left ventricular diastolic parameters are consistent with Grade II diastolic dysfunction (pseudonormalization). Right Ventricle: The right ventricular size is normal. No increase in right ventricular wall thickness. Right ventricular systolic function is normal. There is normal pulmonary artery systolic pressure. The tricuspid regurgitant velocity is 1.59 m/s, and  with an assumed right atrial pressure of 5 mmHg, the estimated right ventricular systolic pressure is 70.6 mmHg. Left Atrium: Left atrial size was mildly dilated. Right Atrium: Right atrial size was normal in size. Pericardium: A small pericardial effusion is present. There is no evidence of cardiac tamponade. Mitral Valve: The mitral valve is normal in structure. No evidence of mitral valve regurgitation. No evidence of mitral valve stenosis. MV peak gradient, 5.7 mmHg. The mean mitral valve gradient is 3.0 mmHg. Tricuspid Valve: The tricuspid valve is normal in structure. Tricuspid valve regurgitation is mild . No evidence of tricuspid stenosis. Aortic Valve: The aortic valve was not well visualized. Aortic valve regurgitation is not visualized. No aortic stenosis is present. Aortic valve mean gradient measures 4.0 mmHg. Aortic valve peak gradient measures 7.4 mmHg. Aortic valve area, by VTI measures 4.99 cm. Pulmonic Valve: The pulmonic valve was normal in structure. Pulmonic valve regurgitation is not visualized. No evidence of pulmonic stenosis. Aorta: The aortic root is normal in size and structure. Venous: The inferior vena cava is normal in size with greater than 50% respiratory variability, suggesting right atrial  pressure of 3 mmHg. IAS/Shunts: No atrial level shunt detected by color flow Doppler.  LEFT VENTRICLE PLAX 2D LVIDd:         5.70 cm   Diastology LVIDs:         3.50 cm   LV e' medial:    6.64 cm/s LV PW:         1.40 cm   LV E/e' medial:  18.4 LV IVS:        1.20 cm   LV e' lateral:   10.30 cm/s LVOT diam:     2.20 cm   LV E/e' lateral: 11.8 LV SV:         111 LV SV Index:   43 LVOT Area:     3.80 cm  RIGHT VENTRICLE RV Basal diam:  5.00 cm RV S prime:     14.90 cm/s TAPSE (M-mode): 5.2 cm LEFT ATRIUM              Index        RIGHT ATRIUM           Index LA diam:        4.70 cm  1.83 cm/m   RA Area:     46.30 cm LA Vol (A2C):   126.0 ml 49.18 ml/m  RA Volume:   221.00 ml 86.26 ml/m LA Vol (A4C):   105.0 ml 40.98 ml/m LA Biplane Vol: 118.0 ml 46.06 ml/m  AORTIC VALVE                    PULMONIC VALVE AV Area (Vmax):    4.64 cm     PV Vmax:  1.06 m/s AV Area (Vmean):   3.99 cm     PV Vmean:       82.200 cm/s AV Area (VTI):     4.99 cm     PV VTI:         0.204 m AV Vmax:           136.00 cm/s  PV Peak grad:   4.5 mmHg AV Vmean:          92.900 cm/s  PV Mean grad:   3.0 mmHg AV VTI:            0.222 m      RVOT Peak grad: 7 mmHg AV Peak Grad:      7.4 mmHg AV Mean Grad:      4.0 mmHg LVOT Vmax:         166.00 cm/s LVOT Vmean:        97.500 cm/s LVOT VTI:          0.291 m LVOT/AV VTI ratio: 1.31  AORTA Ao Root diam: 3.33 cm MITRAL VALVE                TRICUSPID VALVE MV Area (PHT): 3.33 cm     TR Peak grad:   10.1 mmHg MV Area VTI:   3.76 cm     TR Vmax:        159.00 cm/s MV Peak grad:  5.7 mmHg MV Mean grad:  3.0 mmHg     SHUNTS MV Vmax:       1.19 m/s     Systemic VTI:  0.29 m MV Vmean:      82.0 cm/s    Systemic Diam: 2.20 cm MV Decel Time: 228 msec     Pulmonic VTI:  0.260 m MV E velocity: 122.00 cm/s MV A velocity: 121.00 cm/s MV E/A ratio:  1.01 Ida Rogue MD Electronically signed by Ida Rogue MD Signature Date/Time: 12/23/2020/10:15:34 AM    Final     ASSESSMENT: IgA deficiency and  MDS.   PLAN:    IgA deficiency: Patient reports he was diagnosed as a child, but has not had repeated infections throughout adulthood.  Recent and repeat IgA levels were undetectable with a normal IgM and IgG.  No intervention is needed, but patients with isolated IgA deficiency can have anaphylactoid reactions to blood transfusions.  Anti-IgA antibodies are pending at time of dictation.   MDS: Bone marrow biopsy on January 03, 2021 revealed a hypercellular marrow with erythroid hyperplasia and dyserythropoiesis.  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  No blasts, increased plasma cells, or clonality was noted.  Cytogenetics were reported as normal.   Anemia: Improved.  Patient's hemoglobin is 8.2 after multiple transfusions in the hospital last week.  He does not require transfusion tomorrow.  Because of his IgA deficiency he needs to matched and washed packed red blood cells.   Renal insufficiency: Resolved. Hypokalemia: Mild, monitor. Leukopenia: Chronic and unchanged.  Likely secondary to underlying MDS. COVID: Patient received treatment while in patient.  Symptoms are resolving. Disposition: Return to clinic in 1 week with repeat laboratory work and further evaluation.   Patient expressed understanding and was in agreement with this plan. He also understands that He can call clinic at any time with any questions, concerns, or complaints.    Lloyd Huger, MD   01/21/2021 6:51 AM

## 2021-01-23 NOTE — Progress Notes (Signed)
[place]  [phone] Fax:(336) [phone]  ID: Dorathy Kinsman OB: 1965/04/02  MR#: [id number]  JGG#:[id number]  Patient Care Team: Sofie Hartigan, MD as PCP - General (Family Medicine) Lloyd Huger, MD as Consulting Physician (Oncology)  CHIEF COMPLAINT: IgA deficiency and MDS.  INTERVAL HISTORY: Patient returns to clinic today for repeat laboratory work and consideration of additional blood.  He continues to have chronic cough from his recent COVID infection, but this is improving.  He continues to have significant weakness and fatigue.  He has no neurologic complaints.  He denies any recent fevers.  He has a fair appetite, but denies weight loss.  He has no chest pain, shortness of breath, or hemoptysis.  He denies any nausea, vomiting, constipation, or diarrhea.  He has no further melena or hematochezia.  He has no urinary complaints.  Patient offers no further specific complaints today.  REVIEW OF SYSTEMS:   Review of Systems  Constitutional:  Positive for malaise/fatigue. Negative for fever and weight loss.  Respiratory:  Positive for cough. Negative for hemoptysis and shortness of breath.   Cardiovascular: Negative.  Negative for chest pain and leg swelling.  Gastrointestinal: Negative.  Negative for abdominal pain, blood in stool and melena.  Genitourinary: Negative.  Negative for hematuria.  Musculoskeletal: Negative.  Negative for back pain.  Skin: Negative.  Negative for rash.  Neurological:  Positive for weakness. Negative for dizziness, focal weakness and headaches.  Psychiatric/Behavioral: Negative.  The patient is not nervous/anxious.    As per HPI. Otherwise, a complete review of systems is negative.  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[place])    Morbid obesity ([place])    Sleep apnea     PAST SURGICAL HISTORY: Past Surgical History:  Procedure Laterality Date   COLONOSCOPY WITH PROPOFOL N/A 07/13/2020   Procedure: COLONOSCOPY WITH PROPOFOL;  Surgeon: Lesly Rubenstein, MD;  Location: [facility] ENDOSCOPY;  Service: Endoscopy;  Laterality: N/A;   COLONOSCOPY WITH PROPOFOL N/A 12/23/2020   Procedure: COLONOSCOPY WITH PROPOFOL;  Surgeon: Lucilla Lame, MD;  Location: [facility] ENDOSCOPY;  Service: Endoscopy;  Laterality: N/A;   ESOPHAGOGASTRODUODENOSCOPY (EGD) WITH PROPOFOL N/A 12/23/2020   Procedure: ESOPHAGOGASTRODUODENOSCOPY (EGD) WITH PROPOFOL;  Surgeon: Lucilla Lame, MD;  Location: [facility] ENDOSCOPY;  Service: Endoscopy;  Laterality: N/A;   GIVENS CAPSULE STUDY N/A 12/29/2020   Procedure: GIVENS CAPSULE STUDY;  Surgeon: Lin Landsman, MD;  Location: [facility] ENDOSCOPY;  Service: Gastroenterology;  Laterality: N/A;   TOOTH EXTRACTION      FAMILY HISTORY: Family History  Problem Relation Age of Onset   Benign prostatic hyperplasia Father    Psoriasis Father    Hypertension Mother    Hyperlipidemia Mother    Diabetes Maternal Grandmother    Diabetes Paternal Grandmother     ADVANCED DIRECTIVES (Y/N):  N  HEALTH MAINTENANCE: Social History   Tobacco Use   Smoking status: Never   Smokeless tobacco: Never  Vaping Use   Vaping Use: Never used  Substance Use Topics   Alcohol use: No   Drug use: No     Colonoscopy:  PAP:  Bone density:  Lipid panel:  Allergies  Allergen Reactions   Ampicillin Diarrhea  Bactrim [Sulfamethoxazole-Trimethoprim] Hives   Cephalexin    Claritin [Loratadine] Other (See Comments)    Irritates throat   Penicillins Rash    Current Outpatient Medications  Medication Sig Dispense Refill   acarbose (PRECOSE) 25 MG tablet Take 25 mg by mouth 3 (three) times daily with meals.     amLODipine (NORVASC) 10 MG tablet Take 10 mg by mouth  daily.     dorzolamide-timolol (COSOPT) 22.3-6.8 MG/ML ophthalmic solution Place 1 drop into the right eye 2 (two) times daily.     FARXIGA 10 MG TABS tablet Take 10 mg by mouth daily.     fenofibrate 160 MG tablet Take 160 mg by mouth daily.     ferrous sulfate 325 (65 FE) MG EC tablet Take 1 tablet (325 mg total) by mouth 2 (two) times daily. 60 tablet 3   fexofenadine (ALLEGRA) 180 MG tablet Take 180 mg by mouth daily.     fluticasone (FLONASE) 50 MCG/ACT nasal spray Place 2 sprays into both nostrils daily as needed.     glimepiride (AMARYL) 4 MG tablet Take 4 mg by mouth daily with breakfast.     HIBICLENS 4 % external liquid APPLY TOPICALLY DAILY AS NEEDED. TO WASHLEGS AND GROIN AREA 120 mL 3   Hydrocortisone-Aloe 1 % CREA Apply 1 application topically 2 (two) times daily as needed (itching). To itchy area on legs     ketoconazole (NIZORAL) 2 % cream Apply to rash in groin once a day as needed for flares. 60 g 5   lidocaine (LMX) 4 % cream Apply 1 application topically as needed.     lovastatin (MEVACOR) 10 MG tablet Take 10 mg by mouth daily.     metFORMIN (GLUCOPHAGE-XR) 500 MG 24 hr tablet Take 500-1,000 mg by mouth 2 (two) times daily. Takes 1 tablet with breakfast and 2 tablets with supper     metoprolol tartrate (LOPRESSOR) 50 MG tablet Take 50 mg by mouth 2 (two) times daily.     mupirocin ointment (BACTROBAN) 2 % Apply to boils and ulcers QD PRN flares. 22 g 3   omeprazole (PRILOSEC) 20 MG capsule Take 20 mg by mouth daily.     pioglitazone (ACTOS) 30 MG tablet Take 30 mg by mouth daily.     ramipril (ALTACE) 10 MG capsule Take 10 mg by mouth 2 (two) times daily.     spironolactone (ALDACTONE) 50 MG tablet Take 50 mg by mouth daily.     terazosin (HYTRIN) 10 MG capsule Take 10 mg by mouth daily.      torsemide (DEMADEX) 20 MG tablet Take 20 mg by mouth daily.     traZODone (DESYREL) 150 MG tablet Take 150 mg by mouth at bedtime.     TRESIBA FLEXTOUCH 200 UNIT/ML FlexTouch Pen  Inject 32 Units into the skin in the morning.     trolamine salicylate (ASPERCREME) 10 % cream Apply 1 application topically as needed for muscle pain.     vitamin B-12 (CYANOCOBALAMIN) 1000 MCG tablet Take 1,000 mcg by mouth daily.     No current facility-administered medications for this visit.    OBJECTIVE: Vitals:   01/26/21 0907  BP: (!) 107/42  Pulse: 80  Resp: 18  Temp: (!) 96.7 F (35.9 C)  SpO2: 97%     Body mass index is 49.49 kg/m.    ECOG FS:1 - Symptomatic but completely ambulatory  General: Well-developed, well-nourished, no acute distress.  Sitting in a wheelchair. Eyes: Pink conjunctiva, anicteric sclera. HEENT:  Normocephalic, moist mucous membranes. Lungs: No audible wheezing or coughing. Heart: Regular rate and rhythm. Abdomen: Soft, nontender, no obvious distention. Musculoskeletal: No edema, cyanosis, or clubbing. Neuro: Alert, answering all questions appropriately. Cranial nerves grossly intact. Skin: No rashes or petechiae noted. Psych: Normal affect.  LAB RESULTS:  Lab Results  Component Value Date   NA 136 01/20/2021   K 3.3 (L) 01/20/2021   CL 99 01/20/2021   CO2 26 01/20/2021   GLUCOSE 106 (H) 01/20/2021   BUN 41 (H) 01/20/2021   CREATININE 1.12 01/20/2021   CALCIUM 8.8 (L) 01/20/2021   PROT 6.8 01/20/2021   ALBUMIN 3.4 (L) 01/20/2021   AST 14 (L) 01/20/2021   ALT 13 01/20/2021   ALKPHOS 36 (L) 01/20/2021   BILITOT 1.0 01/20/2021   GFRNONAA >60 01/20/2021   GFRAA >60 10/25/2016    Lab Results  Component Value Date   WBC 3.0 (L) 01/26/2021   NEUTROABS 2.4 01/26/2021   HGB 6.6 (L) 01/26/2021   HCT 19.9 (L) 01/26/2021   MCV 99.0 01/26/2021   PLT 211 01/26/2021     STUDIES: US Venous Img Upper Uni Right(DVT)  Result Date: 12/28/2020 CLINICAL DATA:  Right arm swelling EXAM: RIGHT UPPER EXTREMITY VENOUS DOPPLER ULTRASOUND TECHNIQUE: Gray-scale sonography with graded compression, as well as color Doppler and duplex ultrasound were  performed to evaluate the upper extremity deep venous system from the level of the subclavian vein and including the jugular, axillary, basilic, radial, ulnar and upper cephalic vein. Spectral Doppler was utilized to evaluate flow at rest and with distal augmentation maneuvers. COMPARISON:  None. FINDINGS: Contralateral Subclavian Vein: Respiratory phasicity is normal and symmetric with the symptomatic side. No evidence of thrombus. Normal compressibility. Internal Jugular Vein: No evidence of thrombus. Normal compressibility, respiratory phasicity and response to augmentation. Subclavian Vein: No evidence of thrombus. Normal compressibility, respiratory phasicity and response to augmentation. Axillary Vein: No evidence of thrombus. Normal compressibility, respiratory phasicity and response to augmentation. Cephalic Vein: Occlusive thrombus seen in the left cephalic vein from the shoulder to the antecubital region. Basilic Vein: No evidence of thrombus. Normal compressibility, respiratory phasicity and response to augmentation. Brachial Veins: No evidence of thrombus. Normal compressibility, respiratory phasicity and response to augmentation. Radial Veins: No evidence of thrombus. Normal compressibility, respiratory phasicity and response to augmentation. Ulnar Veins: No evidence of thrombus. Normal compressibility, respiratory phasicity and response to augmentation. Venous Reflux:  None visualized. Other Findings:  None visualized. IMPRESSION: Occlusive thrombus in the right upper arm cephalic vein from the shoulder to the elbow. Electronically Signed   By: Rolm Baptise M.D.   On: 12/28/2020 22:44   Portable chest 1 View  Result Date: 01/12/2021 CLINICAL DATA:  Weakness, dyspnea EXAM: PORTABLE CHEST 1 VIEW COMPARISON:  12/14/2020 chest radiograph. FINDINGS: Stable cardiomediastinal silhouette with mild cardiomegaly. No pneumothorax. No pleural effusion. Borderline mild pulmonary edema. No consolidative  airspace disease. IMPRESSION: Borderline mild congestive heart failure. Electronically Signed   By: Ilona Sorrel M.D.   On: 01/12/2021 14:36   CT BONE MARROW BIOPSY & ASPIRATION  Result Date: 01/03/2021 INDICATION: Anemia EXAM: CT BONE MARROW BIOPSY AND ASPIRATION MEDICATIONS: None. ANESTHESIA/SEDATION: Moderate (conscious) sedation was employed during this procedure. A total of Versed 0.5 mg and Fentanyl 50 mcg was administered intravenously. Moderate Sedation Time: 13 minutes. The patient's level of consciousness and vital signs were monitored continuously by radiology nursing throughout the procedure under my direct supervision. FLUOROSCOPY TIME:  N/a COMPLICATIONS: None immediate. PROCEDURE: Informed written consent was obtained  from the patient after a thorough discussion of the procedural risks, benefits and alternatives. All questions were addressed. Maximal Sterile Barrier Technique was utilized including caps, mask, sterile gowns, sterile gloves, sterile drape, hand hygiene and skin antiseptic. A timeout was performed prior to the initiation of the procedure. The patient was placed prone on the CT exam table. Limited CT of the pelvis was performed for planning purposes. Skin entry site was marked, and the overlying skin was prepped and draped in the standard sterile fashion. Local analgesia was obtained with 1% lidocaine. Using CT guidance, an 11 gauge needle was advanced just deep to the cortex of the right posterior ilium. Subsequently, bone marrow aspiration and core biopsy were performed. Specimens were submitted to lab/pathology for handling. Hemostasis was achieved with manual pressure, and a clean dressing was placed. The patient tolerated the procedure well without immediate complication. IMPRESSION: Successful CT-guided bone marrow aspiration and core biopsy of the right posterior ilium. Electronically Signed   By: Albin Felling M.D.   On: 01/03/2021 14:25    ASSESSMENT: IgA deficiency and  MDS.   PLAN:    IgA deficiency: Patient reports he was diagnosed as a child, but has not had repeated infections throughout adulthood.  Recent and repeat IgA levels were undetectable with a normal IgM and IgG.  No intervention is needed, but patients with isolated IgA deficiency can have anaphylactoid reactions to blood transfusions.  Anti-IgA antibodies are pending at time of dictation.   MDS: Bone marrow biopsy on January 03, 2021 revealed a hypercellular marrow with erythroid hyperplasia and dyserythropoiesis.  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  No blasts, increased plasma cells, or clonality was noted.  Cytogenetics were reported as normal.  Patient will have second opinion consultation at [facility] in the near future.  Can consider initiating Vidaza to reduce transfusion requirement. Anemia: Worse.  Patient's hemoglobin is 6.6.  Return to clinic on Friday for 2 units packed red blood cells.  Because of his IgA deficiency he needs to matched and washed packed red blood cells.   Renal insufficiency: Resolved. Hypokalemia: Mild, monitor. Leukopenia: Chronic and unchanged.  Likely secondary to underlying MDS. COVID: Patient received treatment while in patient.  Symptoms are resolving. Disposition: Turn to clinic in 1 week with repeat laboratory work and further evaluation and then possibly blood transfusion 2 days later.  Patient expressed understanding and was in agreement with this plan. He also understands that He can call clinic at any time with any questions, concerns, or complaints.    Lloyd Huger, MD   01/27/2021 9:36 AM

## 2021-01-26 ENCOUNTER — Inpatient Hospital Stay: Payer: Medicare HMO | Admitting: Oncology

## 2021-01-26 ENCOUNTER — Other Ambulatory Visit: Payer: Self-pay

## 2021-01-26 ENCOUNTER — Inpatient Hospital Stay: Payer: Medicare HMO

## 2021-01-26 VITALS — BP 107/42 | HR 80 | Temp 96.7°F | Resp 18 | Wt 316.0 lb

## 2021-01-26 DIAGNOSIS — D802 Selective deficiency of immunoglobulin A [IgA]: Secondary | ICD-10-CM | POA: Diagnosis not present

## 2021-01-26 DIAGNOSIS — D72819 Decreased white blood cell count, unspecified: Secondary | ICD-10-CM

## 2021-01-26 DIAGNOSIS — D469 Myelodysplastic syndrome, unspecified: Secondary | ICD-10-CM | POA: Diagnosis not present

## 2021-01-26 DIAGNOSIS — D649 Anemia, unspecified: Secondary | ICD-10-CM

## 2021-01-26 DIAGNOSIS — D62 Acute posthemorrhagic anemia: Secondary | ICD-10-CM

## 2021-01-26 LAB — CBC WITH DIFFERENTIAL/PLATELET
Abs Immature Granulocytes: 0.08 10*3/uL — ABNORMAL HIGH (ref 0.00–0.07)
Basophils Absolute: 0 10*3/uL (ref 0.0–0.1)
Basophils Relative: 0 %
Eosinophils Absolute: 0 10*3/uL (ref 0.0–0.5)
Eosinophils Relative: 0 %
HCT: 19.9 % — ABNORMAL LOW (ref 39.0–52.0)
Hemoglobin: 6.6 g/dL — ABNORMAL LOW (ref 13.0–17.0)
Immature Granulocytes: 3 %
Lymphocytes Relative: 5 %
Lymphs Abs: 0.1 10*3/uL — ABNORMAL LOW (ref 0.7–4.0)
MCH: 32.8 pg (ref 26.0–34.0)
MCHC: 33.2 g/dL (ref 30.0–36.0)
MCV: 99 fL (ref 80.0–100.0)
Monocytes Absolute: 0.3 10*3/uL (ref 0.1–1.0)
Monocytes Relative: 11 %
Neutro Abs: 2.4 10*3/uL (ref 1.7–7.7)
Neutrophils Relative %: 81 %
Platelets: 211 10*3/uL (ref 150–400)
RBC: 2.01 MIL/uL — ABNORMAL LOW (ref 4.22–5.81)
RDW: 17.8 % — ABNORMAL HIGH (ref 11.5–15.5)
WBC: 3 10*3/uL — ABNORMAL LOW (ref 4.0–10.5)
nRBC: 0 % (ref 0.0–0.2)

## 2021-01-26 LAB — SAMPLE TO BLOOD BANK

## 2021-01-26 LAB — SURGICAL PATHOLOGY

## 2021-01-26 NOTE — Progress Notes (Signed)
Patient is still having some symptoms from covid (cough, rib pain, headaches). He reports SOB on exertion.

## 2021-01-27 ENCOUNTER — Ambulatory Visit: Payer: Medicare HMO | Admitting: Oncology

## 2021-01-27 ENCOUNTER — Other Ambulatory Visit: Payer: Medicare HMO

## 2021-01-28 ENCOUNTER — Inpatient Hospital Stay: Payer: Medicare HMO

## 2021-01-28 ENCOUNTER — Other Ambulatory Visit: Payer: Self-pay

## 2021-01-28 DIAGNOSIS — D802 Selective deficiency of immunoglobulin A [IgA]: Secondary | ICD-10-CM | POA: Diagnosis not present

## 2021-01-28 DIAGNOSIS — D469 Myelodysplastic syndrome, unspecified: Secondary | ICD-10-CM

## 2021-01-28 LAB — PREPARE RBC (CROSSMATCH)

## 2021-01-28 MED ORDER — DIPHENHYDRAMINE HCL 50 MG/ML IJ SOLN
25.0000 mg | Freq: Once | INTRAMUSCULAR | Status: AC
  Administered 2021-01-28: 25 mg via INTRAVENOUS
  Filled 2021-01-28: qty 1

## 2021-01-28 MED ORDER — METHYLPREDNISOLONE SODIUM SUCC 125 MG IJ SOLR
40.0000 mg | Freq: Once | INTRAMUSCULAR | Status: AC
  Administered 2021-01-28: 40 mg via INTRAVENOUS
  Filled 2021-01-28: qty 2

## 2021-01-28 MED ORDER — ACETAMINOPHEN 325 MG PO TABS
650.0000 mg | ORAL_TABLET | Freq: Once | ORAL | Status: AC
  Administered 2021-01-28: 650 mg via ORAL
  Filled 2021-01-28: qty 2

## 2021-01-28 MED ORDER — SODIUM CHLORIDE 0.9% IV SOLUTION
250.0000 mL | Freq: Once | INTRAVENOUS | Status: AC
  Administered 2021-01-28: 250 mL via INTRAVENOUS
  Filled 2021-01-28: qty 250

## 2021-01-28 NOTE — Patient Instructions (Signed)
Blood Transfusion, Adult, Care After This sheet gives you information about how to care for yourself after your procedure. Your doctor may also give you more specific instructions. If you have problems or questions, contact your doctor. What can I expect after the procedure? After the procedure, it is common to have: Bruising and soreness at the IV site. A headache. Follow these instructions at home: Insertion site care   Follow instructions from your doctor about how to take care of your insertion site. This is where an IV tube was put into your vein. Make sure you: Wash your hands with soap and water before and after you change your bandage (dressing). If you cannot use soap and water, use hand sanitizer. Change your bandage as told by your doctor. Check your insertion site every day for signs of infection. Check for: Redness, swelling, or pain. Bleeding from the site. Warmth. Pus or a bad smell. General instructions Take over-the-counter and prescription medicines only as told by your doctor. Rest as told by your doctor. Go back to your normal activities as told by your doctor. Keep all follow-up visits as told by your doctor. This is important. Contact a doctor if: You have itching or red, swollen areas of skin (hives). You feel worried or nervous (anxious). You feel weak after doing your normal activities. You have redness, swelling, warmth, or pain around the insertion site. You have blood coming from the insertion site, and the blood does not stop with pressure. You have pus or a bad smell coming from the insertion site. Get help right away if: You have signs of a serious reaction. This may be coming from an allergy or the body's defense system (immune system). Signs include: Trouble breathing or shortness of breath. Swelling of the face or feeling warm (flushed). Fever or chills. Head, chest, or back pain. Dark pee (urine) or blood in the pee. Widespread rash. Fast  heartbeat. Feeling dizzy or light-headed. You may receive your blood transfusion in an outpatient setting. If so, you will be told whom to contact to report any reactions. These symptoms may be an emergency. Do not wait to see if the symptoms will go away. Get medical help right away. Call your local emergency services (911 in the [place] Do not drive yourself to the hospital. Summary Bruising and soreness at the IV site are common. Check your insertion site every day for signs of infection. Rest as told by your doctor. Go back to your normal activities as told by your doctor. Get help right away if you have signs of a serious reaction. This information is not intended to replace advice given to you by your health care provider. Make sure you discuss any questions you have with your health care provider. Document Revised: 04/29/2020 Document Reviewed: 06/27/2018 Elsevier Patient Education  2022 [employer]  

## 2021-02-01 NOTE — Progress Notes (Signed)
[place]  [phone] [phone]  ID: Patrick Duncan OB: 08-18-1965  MR#: [id number]  PNT#:[id number]  Patient Care Team: Sofie Hartigan, MD as PCP - General (Family Medicine) Lloyd Huger, MD as Consulting Physician (Oncology)  CHIEF COMPLAINT: IgA deficiency and MDS.  INTERVAL HISTORY: Patient returns to clinic today for repeat laboratory work, further evaluation, and consideration of additional blood.  His cough has improved.  He continues to have chronic weakness and fatigue, but this is improved as well.  He has no neurologic complaints.  He denies any recent fevers.  He has a fair appetite, but denies weight loss.  He has no chest pain, shortness of breath, or hemoptysis.  He denies any nausea, vomiting, constipation, or diarrhea.  He has no further melena or hematochezia.  He has no urinary complaints.  Patient offers no further specific complaints today.  REVIEW OF SYSTEMS:   Review of Systems  Constitutional:  Positive for malaise/fatigue. Negative for fever and weight loss.  Respiratory:  Positive for cough. Negative for hemoptysis and shortness of breath.   Cardiovascular: Negative.  Negative for chest pain and leg swelling.  Gastrointestinal: Negative.  Negative for abdominal pain, blood in stool and melena.  Genitourinary: Negative.  Negative for hematuria.  Musculoskeletal: Negative.  Negative for back pain.  Skin: Negative.  Negative for rash.  Neurological:  Positive for weakness. Negative for dizziness, focal weakness and headaches.  Psychiatric/Behavioral: Negative.  The patient is not nervous/anxious.    As per HPI. Otherwise, a complete review of systems is negative.  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[place])    Morbid obesity ([place])    Sleep apnea     PAST SURGICAL HISTORY: Past Surgical History:  Procedure Laterality Date   COLONOSCOPY WITH PROPOFOL N/A 07/13/2020   Procedure: COLONOSCOPY WITH PROPOFOL;  Surgeon: Lesly Rubenstein, MD;  Location: [facility] ENDOSCOPY;  Service: Endoscopy;  Laterality: N/A;   COLONOSCOPY WITH PROPOFOL N/A 12/23/2020   Procedure: COLONOSCOPY WITH PROPOFOL;  Surgeon: Lucilla Lame, MD;  Location: [facility] ENDOSCOPY;  Service: Endoscopy;  Laterality: N/A;   ESOPHAGOGASTRODUODENOSCOPY (EGD) WITH PROPOFOL N/A 12/23/2020   Procedure: ESOPHAGOGASTRODUODENOSCOPY (EGD) WITH PROPOFOL;  Surgeon: Lucilla Lame, MD;  Location: [facility] ENDOSCOPY;  Service: Endoscopy;  Laterality: N/A;   GIVENS CAPSULE STUDY N/A 12/29/2020   Procedure: GIVENS CAPSULE STUDY;  Surgeon: Lin Landsman, MD;  Location: [facility] ENDOSCOPY;  Service: Gastroenterology;  Laterality: N/A;   TOOTH EXTRACTION      FAMILY HISTORY: Family History  Problem Relation Age of Onset   Benign prostatic hyperplasia Father    Psoriasis Father    Hypertension Mother    Hyperlipidemia Mother    Diabetes Maternal Grandmother    Diabetes Paternal Grandmother     ADVANCED DIRECTIVES (Y/N):  N  HEALTH MAINTENANCE: Social History   Tobacco Use   Smoking status: Never   Smokeless tobacco: Never  Vaping Use   Vaping Use: Never used  Substance Use Topics   Alcohol use: No   Drug use: No     Colonoscopy:  PAP:  Bone density:  Lipid panel:  Allergies  Allergen Reactions   Ampicillin Diarrhea   Bactrim [  Sulfamethoxazole-Trimethoprim] Hives   Cephalexin    Claritin [Loratadine] Other (See Comments)    Irritates throat   Penicillins Rash    Current Outpatient Medications  Medication Sig Dispense Refill   acarbose (PRECOSE) 25 MG tablet Take 25 mg by mouth 3 (three) times daily with meals.     amLODipine (NORVASC) 10 MG tablet Take 10 mg by mouth daily.      dorzolamide-timolol (COSOPT) 22.3-6.8 MG/ML ophthalmic solution Place 1 drop into the right eye 2 (two) times daily.     FARXIGA 10 MG TABS tablet Take 10 mg by mouth daily.     fenofibrate 160 MG tablet Take 160 mg by mouth daily.     ferrous sulfate 325 (65 FE) MG EC tablet Take 1 tablet (325 mg total) by mouth 2 (two) times daily. 60 tablet 3   fexofenadine (ALLEGRA) 180 MG tablet Take 180 mg by mouth daily.     fluticasone (FLONASE) 50 MCG/ACT nasal spray Place 2 sprays into both nostrils daily as needed.     glimepiride (AMARYL) 4 MG tablet Take 4 mg by mouth daily with breakfast.     HIBICLENS 4 % external liquid APPLY TOPICALLY DAILY AS NEEDED. TO WASHLEGS AND GROIN AREA 120 mL 3   Hydrocortisone-Aloe 1 % CREA Apply 1 application topically 2 (two) times daily as needed (itching). To itchy area on legs     ketoconazole (NIZORAL) 2 % cream Apply to rash in groin once a day as needed for flares. 60 g 5   lidocaine (LMX) 4 % cream Apply 1 application topically as needed.     lovastatin (MEVACOR) 10 MG tablet Take 10 mg by mouth daily.     metFORMIN (GLUCOPHAGE-XR) 500 MG 24 hr tablet Take 500-1,000 mg by mouth 2 (two) times daily. Takes 1 tablet with breakfast and 2 tablets with supper     metoprolol tartrate (LOPRESSOR) 50 MG tablet Take 50 mg by mouth 2 (two) times daily.     mupirocin ointment (BACTROBAN) 2 % Apply to boils and ulcers QD PRN flares. 22 g 3   omeprazole (PRILOSEC) 20 MG capsule Take 20 mg by mouth daily.     pioglitazone (ACTOS) 30 MG tablet Take 30 mg by mouth daily.     ramipril (ALTACE) 10 MG capsule Take 10 mg by mouth 2 (two) times daily.     spironolactone (ALDACTONE) 50 MG tablet Take 50 mg by mouth daily.     terazosin (HYTRIN) 10 MG capsule Take 10 mg by mouth daily.      torsemide (DEMADEX) 20 MG tablet Take 20 mg by mouth daily.     traZODone (DESYREL) 150 MG tablet Take 150 mg by mouth at bedtime.     TRESIBA FLEXTOUCH 200 UNIT/ML FlexTouch Pen Inject 32 Units  into the skin in the morning.     trolamine salicylate (ASPERCREME) 10 % cream Apply 1 application topically as needed for muscle pain.     vitamin B-12 (CYANOCOBALAMIN) 1000 MCG tablet Take 1,000 mcg by mouth daily.     No current facility-administered medications for this visit.    OBJECTIVE: Vitals:   02/02/21 0932  BP: (!) 134/58  Pulse: 71  Resp: 18  Temp: (!) 97.5 F (36.4 C)  SpO2: 98%     Body mass index is 49.34 kg/m.    ECOG FS:1 - Symptomatic but completely ambulatory  General: Well-developed, well-nourished, no acute distress.  Sitting in a wheelchair. Eyes: Pink conjunctiva, anicteric sclera. HEENT: Normocephalic,  moist mucous membranes. Lungs: No audible wheezing or coughing. Heart: Regular rate and rhythm. Abdomen: Soft, nontender, no obvious distention. Musculoskeletal: No edema, cyanosis, or clubbing. Neuro: Alert, answering all questions appropriately. Cranial nerves grossly intact. Skin: No rashes or petechiae noted. Psych: Normal affect.   LAB RESULTS:  Lab Results  Component Value Date   NA 136 01/20/2021   K 3.3 (L) 01/20/2021   CL 99 01/20/2021   CO2 26 01/20/2021   GLUCOSE 106 (H) 01/20/2021   BUN 41 (H) 01/20/2021   CREATININE 1.12 01/20/2021   CALCIUM 8.8 (L) 01/20/2021   PROT 6.8 01/20/2021   ALBUMIN 3.4 (L) 01/20/2021   AST 14 (L) 01/20/2021   ALT 13 01/20/2021   ALKPHOS 36 (L) 01/20/2021   BILITOT 1.0 01/20/2021   GFRNONAA >60 01/20/2021   GFRAA >60 10/25/2016    Lab Results  Component Value Date   WBC 3.2 (L) 02/02/2021   NEUTROABS 2.7 02/02/2021   HGB 7.2 (L) 02/02/2021   HCT 21.9 (L) 02/02/2021   MCV 99.1 02/02/2021   PLT 336 02/02/2021     STUDIES: Portable chest 1 View  Result Date: 01/12/2021 CLINICAL DATA:  Weakness, dyspnea EXAM: PORTABLE CHEST 1 VIEW COMPARISON:  12/14/2020 chest radiograph. FINDINGS: Stable cardiomediastinal silhouette with mild cardiomegaly. No pneumothorax. No pleural effusion. Borderline  mild pulmonary edema. No consolidative airspace disease. IMPRESSION: Borderline mild congestive heart failure. Electronically Signed   By: Ilona Sorrel M.D.   On: 01/12/2021 14:36    ASSESSMENT: IgA deficiency and MDS.   PLAN:    IgA deficiency: Patient reports he was diagnosed as a child, but has not had repeated infections throughout adulthood.  Recent and repeat IgA levels were undetectable with a normal IgM and IgG.  No intervention is needed, but patients with isolated IgA deficiency can have anaphylactoid reactions to blood transfusions.  Anti-IgA antibodies are pending at time of dictation.   MDS: Bone marrow biopsy on January 03, 2021 revealed a hypercellular marrow with erythroid hyperplasia and dyserythropoiesis.  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  No blasts, increased plasma cells, or clonality was noted.  Cytogenetics were reported as normal.  Patient will have second opinion consultation at [facility] in the near future.  Can consider initiating Vidaza to reduce transfusion requirement. Anemia: Hemoglobin decreased, but improved after receiving 2 units packed red blood cells last week.  Proceed with 1 additional unit on Friday.  Because of his IgA deficiency he needs to matched and washed packed red blood cells.  Premedications include Benadryl, Tylenol, and Solu-Medrol. Renal insufficiency: Resolved. Hypokalemia: Mild, monitor. Leukopenia: Chronic and unchanged.  Likely secondary to underlying MDS. COVID: Significantly improved.  Disposition: Turn to clinic in 1 week with repeat laboratory work and further evaluation and then possibly blood transfusion 2 days later.  Patient expressed understanding and was in agreement with this plan. He also understands that He can call clinic at any time with any questions, concerns, or complaints.    Lloyd Huger, MD   02/03/2021 6:33 AM

## 2021-02-02 ENCOUNTER — Inpatient Hospital Stay: Payer: Medicare HMO

## 2021-02-02 ENCOUNTER — Inpatient Hospital Stay: Payer: Medicare HMO | Admitting: Oncology

## 2021-02-02 ENCOUNTER — Other Ambulatory Visit: Payer: Self-pay

## 2021-02-02 VITALS — BP 134/58 | HR 71 | Temp 97.5°F | Resp 18 | Wt 315.0 lb

## 2021-02-02 DIAGNOSIS — D802 Selective deficiency of immunoglobulin A [IgA]: Secondary | ICD-10-CM | POA: Diagnosis not present

## 2021-02-02 DIAGNOSIS — D469 Myelodysplastic syndrome, unspecified: Secondary | ICD-10-CM

## 2021-02-02 LAB — CBC WITH DIFFERENTIAL/PLATELET
Abs Immature Granulocytes: 0.06 10*3/uL (ref 0.00–0.07)
Basophils Absolute: 0 10*3/uL (ref 0.0–0.1)
Basophils Relative: 0 %
Eosinophils Absolute: 0 10*3/uL (ref 0.0–0.5)
Eosinophils Relative: 0 %
HCT: 21.9 % — ABNORMAL LOW (ref 39.0–52.0)
Hemoglobin: 7.2 g/dL — ABNORMAL LOW (ref 13.0–17.0)
Immature Granulocytes: 2 %
Lymphocytes Relative: 6 %
Lymphs Abs: 0.2 10*3/uL — ABNORMAL LOW (ref 0.7–4.0)
MCH: 32.6 pg (ref 26.0–34.0)
MCHC: 32.9 g/dL (ref 30.0–36.0)
MCV: 99.1 fL (ref 80.0–100.0)
Monocytes Absolute: 0.3 10*3/uL (ref 0.1–1.0)
Monocytes Relative: 8 %
Neutro Abs: 2.7 10*3/uL (ref 1.7–7.7)
Neutrophils Relative %: 84 %
Platelets: 336 10*3/uL (ref 150–400)
RBC: 2.21 MIL/uL — ABNORMAL LOW (ref 4.22–5.81)
RDW: 18 % — ABNORMAL HIGH (ref 11.5–15.5)
WBC: 3.2 10*3/uL — ABNORMAL LOW (ref 4.0–10.5)
nRBC: 0 % (ref 0.0–0.2)

## 2021-02-02 NOTE — Progress Notes (Signed)
Pt c/o cough and shortness of breath from covid residual.

## 2021-02-03 NOTE — Progress Notes (Signed)
[place]  Telephone:(336) [phone] [phone]  ID: Patrick Duncan OB: 1965/11/29  MR#: [id number]  FOY#:[id number]  Patient Care Team: Sofie Hartigan, MD as PCP - General (Family Medicine) Lloyd Huger, MD as Consulting Physician (Oncology)  CHIEF COMPLAINT: IgA deficiency and MDS.  INTERVAL HISTORY: Patient returns to clinic today for repeat laboratory, further evaluation, and consideration of additional blood.  He currently feels well.  His cough is nearly resolved and he does not complain of weakness and fatigue.  He has no neurologic complaints.  He denies any recent fevers.  He has a fair appetite, but denies weight loss.  He has no chest pain, shortness of breath, or hemoptysis.  He denies any nausea, vomiting, constipation, or diarrhea.  He has no further melena or hematochezia.  He has no urinary complaints.  [name]  REVIEW OF SYSTEMS:   Review of Systems  Constitutional: Negative.  Negative for fever, malaise/fatigue and weight loss.  Respiratory: Negative.  Negative for cough, hemoptysis and shortness of breath.   Cardiovascular: Negative.  Negative for chest pain and leg swelling.  Gastrointestinal: Negative.  Negative for abdominal pain, blood in stool and melena.  Genitourinary: Negative.  Negative for hematuria.  Musculoskeletal: Negative.  Negative for back pain.  Skin: Negative.  Negative for rash.  Neurological: Negative.  Negative for dizziness, focal weakness, weakness and headaches.  Psychiatric/Behavioral: Negative.  The patient is not nervous/anxious.    As per HPI. Otherwise, a complete review of systems is negative.  PAST MEDICAL HISTORY: Past Medical History:  Diagnosis Date   Arthritis    Cellulitis and abscess of left leg    Depression    Diabetes mellitus without complication (HCC)    Hearing loss    History of IBS    HLD (hyperlipidemia)    Hypertension    IgA  deficiency ([place])    Morbid obesity ([place])    Sleep apnea     PAST SURGICAL HISTORY: Past Surgical History:  Procedure Laterality Date   COLONOSCOPY WITH PROPOFOL N/A 07/13/2020   Procedure: COLONOSCOPY WITH PROPOFOL;  Surgeon: Lesly Rubenstein, MD;  Location: [facility] ENDOSCOPY;  Service: Endoscopy;  Laterality: N/A;   COLONOSCOPY WITH PROPOFOL N/A 12/23/2020   Procedure: COLONOSCOPY WITH PROPOFOL;  Surgeon: Lucilla Lame, MD;  Location: [facility] ENDOSCOPY;  Service: Endoscopy;  Laterality: N/A;   ESOPHAGOGASTRODUODENOSCOPY (EGD) WITH PROPOFOL N/A 12/23/2020   Procedure: ESOPHAGOGASTRODUODENOSCOPY (EGD) WITH PROPOFOL;  Surgeon: Lucilla Lame, MD;  Location: [facility] ENDOSCOPY;  Service: Endoscopy;  Laterality: N/A;   GIVENS CAPSULE STUDY N/A 12/29/2020   Procedure: GIVENS CAPSULE STUDY;  Surgeon: Lin Landsman, MD;  Location: [facility] ENDOSCOPY;  Service: Gastroenterology;  Laterality: N/A;   TOOTH EXTRACTION      FAMILY HISTORY: Family History  Problem Relation Age of Onset   Benign prostatic hyperplasia Father    Psoriasis Father    Hypertension Mother    Hyperlipidemia Mother    Diabetes Maternal Grandmother    Diabetes Paternal Grandmother     ADVANCED DIRECTIVES (Y/N):  N  HEALTH MAINTENANCE: Social History   Tobacco Use   Smoking status: Never   Smokeless tobacco: Never  Vaping Use   Vaping Use: Never used  Substance Use Topics   Alcohol use: No   Drug use: No     Colonoscopy:  PAP:  Bone density:  Lipid panel:  Allergies  Allergen Reactions   Ampicillin Diarrhea   Bactrim [Sulfamethoxazole-Trimethoprim] Hives   Cephalexin  Claritin [Loratadine] Other (See Comments)    Irritates throat   Penicillins Rash    Current Outpatient Medications  Medication Sig Dispense Refill   acarbose (PRECOSE) 25 MG tablet Take 25 mg by mouth 3 (three) times daily with meals.     amLODipine (NORVASC) 10 MG tablet Take 10 mg by mouth daily.     dorzolamide-timolol (COSOPT)  22.3-6.8 MG/ML ophthalmic solution Place 1 drop into the right eye 2 (two) times daily.     FARXIGA 10 MG TABS tablet Take 10 mg by mouth daily.     fenofibrate 160 MG tablet Take 160 mg by mouth daily.     ferrous sulfate 325 (65 FE) MG EC tablet Take 1 tablet (325 mg total) by mouth 2 (two) times daily. 60 tablet 3   fexofenadine (ALLEGRA) 180 MG tablet Take 180 mg by mouth daily.     fluticasone (FLONASE) 50 MCG/ACT nasal spray Place 2 sprays into both nostrils daily as needed.     glimepiride (AMARYL) 4 MG tablet Take 4 mg by mouth daily with breakfast.     HIBICLENS 4 % external liquid APPLY TOPICALLY DAILY AS NEEDED. TO WASHLEGS AND GROIN AREA 120 mL 3   Hydrocortisone-Aloe 1 % CREA Apply 1 application topically 2 (two) times daily as needed (itching). To itchy area on legs     ketoconazole (NIZORAL) 2 % cream Apply to rash in groin once a day as needed for flares. 60 g 5   lidocaine (LMX) 4 % cream Apply 1 application topically as needed.     lovastatin (MEVACOR) 10 MG tablet Take 10 mg by mouth daily.     metFORMIN (GLUCOPHAGE-XR) 500 MG 24 hr tablet Take 500-1,000 mg by mouth 2 (two) times daily. Takes 1 tablet with breakfast and 2 tablets with supper     metoprolol tartrate (LOPRESSOR) 50 MG tablet Take 50 mg by mouth 2 (two) times daily.     mupirocin ointment (BACTROBAN) 2 % Apply to boils and ulcers QD PRN flares. 22 g 3   omeprazole (PRILOSEC) 20 MG capsule Take 20 mg by mouth daily.     pioglitazone (ACTOS) 30 MG tablet Take 30 mg by mouth daily.     ramipril (ALTACE) 10 MG capsule Take 10 mg by mouth 2 (two) times daily.     spironolactone (ALDACTONE) 50 MG tablet Take 50 mg by mouth daily.     terazosin (HYTRIN) 10 MG capsule Take 10 mg by mouth daily.      torsemide (DEMADEX) 20 MG tablet Take 20 mg by mouth daily.     traZODone (DESYREL) 150 MG tablet Take 150 mg by mouth at bedtime.     TRESIBA FLEXTOUCH 200 UNIT/ML FlexTouch Pen Inject 32 Units into the skin in the morning.      trolamine salicylate (ASPERCREME) 10 % cream Apply 1 application topically as needed for muscle pain.     vitamin B-12 (CYANOCOBALAMIN) 1000 MCG tablet Take 1,000 mcg by mouth daily.     No current facility-administered medications for this visit.    OBJECTIVE: Vitals:   02/09/21 1002  BP: (!) 130/48  Pulse: 71  Resp: 16  SpO2: 100%     Body mass index is 50.51 kg/m.    ECOG FS:1 - Symptomatic but completely ambulatory  General: Well-developed, well-nourished, no acute distress.  Sitting in a wheelchair. Eyes: Pink conjunctiva, anicteric sclera. HEENT: Normocephalic, moist mucous membranes. Lungs: No audible wheezing or coughing. Heart: Regular rate and rhythm. Abdomen:  Soft, nontender, no obvious distention. Musculoskeletal: No edema, cyanosis, or clubbing. Neuro: Alert, answering all questions appropriately. Cranial nerves grossly intact. Skin: No rashes or petechiae noted. Psych: Normal affect.   LAB RESULTS:  Lab Results  Component Value Date   NA 136 01/20/2021   K 3.3 (L) 01/20/2021   CL 99 01/20/2021   CO2 26 01/20/2021   GLUCOSE 106 (H) 01/20/2021   BUN 41 (H) 01/20/2021   CREATININE 1.12 01/20/2021   CALCIUM 8.8 (L) 01/20/2021   PROT 6.8 01/20/2021   ALBUMIN 3.4 (L) 01/20/2021   AST 14 (L) 01/20/2021   ALT 13 01/20/2021   ALKPHOS 36 (L) 01/20/2021   BILITOT 1.0 01/20/2021   GFRNONAA >60 01/20/2021   GFRAA >60 10/25/2016    Lab Results  Component Value Date   WBC 2.8 (L) 02/09/2021   NEUTROABS 2.2 02/09/2021   HGB 7.8 (L) 02/09/2021   HCT 23.8 (L) 02/09/2021   MCV 100.8 (H) 02/09/2021   PLT 289 02/09/2021     STUDIES: Portable chest 1 View  Result Date: 01/12/2021 CLINICAL DATA:  Weakness, dyspnea EXAM: PORTABLE CHEST 1 VIEW COMPARISON:  12/14/2020 chest radiograph. FINDINGS: Stable cardiomediastinal silhouette with mild cardiomegaly. No pneumothorax. No pleural effusion. Borderline mild pulmonary edema. No consolidative airspace disease.  IMPRESSION: Borderline mild congestive heart failure. Electronically Signed   By: Ilona Sorrel M.D.   On: 01/12/2021 14:36    ASSESSMENT: IgA deficiency and MDS.   PLAN:    IgA deficiency: Patient reports he was diagnosed as a child, but has not had repeated infections throughout adulthood.  Recent and repeat IgA levels were undetectable with a normal IgM and IgG.  Patient also has anti-IgA antibodies.  Patients with isolated IgA deficiency can have anaphylactoid reactions to blood transfusions.   MDS: Bone marrow biopsy on January 03, 2021 revealed a hypercellular marrow with erythroid hyperplasia and dyserythropoiesis.  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  No blasts, increased plasma cells, or clonality was noted.  Cytogenetics were reported as normal.  Patient will have second opinion consultation at [facility] in the near future.  Can consider initiating Vidaza to reduce transfusion requirement. Anemia: Hemoglobin improved to 7.8 with 1 unit packed red blood cells today.  Plan to transfuse if hemoglobin is below 8.0.  Proceed with 1 additional unit on Friday.  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  Premedications include Benadryl, Tylenol, and Solu-Medrol. Renal insufficiency: Resolved. Hypokalemia: Mild, monitor. Leukopenia: Chronic and unchanged.  Likely secondary to underlying MDS. COVID: Essentially resolved. Disposition: Return to clinic in 1 week with repeat laboratory work and further evaluation and then possibly a blood transfusion 2 days later if his hemoglobin remains below 8.0.  Patient expressed understanding and was in agreement with this plan. He also understands that He can call clinic at any time with any questions, concerns, or complaints.    Lloyd Huger, MD   02/09/2021 8:29 PM

## 2021-02-04 ENCOUNTER — Other Ambulatory Visit: Payer: Self-pay

## 2021-02-04 ENCOUNTER — Inpatient Hospital Stay: Payer: Medicare HMO

## 2021-02-04 DIAGNOSIS — D469 Myelodysplastic syndrome, unspecified: Secondary | ICD-10-CM

## 2021-02-04 DIAGNOSIS — D802 Selective deficiency of immunoglobulin A [IgA]: Secondary | ICD-10-CM | POA: Diagnosis not present

## 2021-02-04 LAB — PREPARE RBC (CROSSMATCH)

## 2021-02-04 MED ORDER — ACETAMINOPHEN 325 MG PO TABS
650.0000 mg | ORAL_TABLET | Freq: Once | ORAL | Status: AC
  Administered 2021-02-04: 650 mg via ORAL
  Filled 2021-02-04: qty 2

## 2021-02-04 MED ORDER — HEPARIN SOD (PORK) LOCK FLUSH 100 UNIT/ML IV SOLN
250.0000 [IU] | INTRAVENOUS | Status: DC | PRN
  Filled 2021-02-04: qty 5

## 2021-02-04 MED ORDER — DIPHENHYDRAMINE HCL 50 MG/ML IJ SOLN
25.0000 mg | Freq: Once | INTRAMUSCULAR | Status: AC
  Administered 2021-02-04: 25 mg via INTRAVENOUS
  Filled 2021-02-04: qty 1

## 2021-02-04 MED ORDER — METHYLPREDNISOLONE SODIUM SUCC 125 MG IJ SOLR
40.0000 mg | Freq: Once | INTRAMUSCULAR | Status: AC
  Administered 2021-02-04: 40 mg via INTRAVENOUS
  Filled 2021-02-04: qty 2

## 2021-02-04 MED ORDER — SODIUM CHLORIDE 0.9% FLUSH
3.0000 mL | INTRAVENOUS | Status: DC | PRN
  Filled 2021-02-04: qty 3

## 2021-02-04 NOTE — Patient Instructions (Signed)
[facility] CANCER CTR AT [place]  Discharge Instructions: Thank you for choosing [facility] to provide your oncology and hematology care.  If you have a lab appointment with the [facility], please go directly to the [facility] and check in at the registration area.  Wear comfortable clothing and clothing appropriate for easy access to any Portacath or PICC line.   We strive to give you quality time with your provider. You may need to reschedule your appointment if you arrive late (15 or more minutes).  Arriving late affects you and other patients whose appointments are after yours.  Also, if you miss three or more appointments without notifying the office, you may be dismissed from the clinic at the providers discretion.      For prescription refill requests, have your pharmacy contact our office and allow 72 hours for refills to be completed.    Today you received the following chemotherapy and/or immunotherapy agents ONE UNIT BLOOD      To help prevent nausea and vomiting after your treatment, we encourage you to take your nausea medication as directed.  BELOW ARE SYMPTOMS THAT SHOULD BE REPORTED IMMEDIATELY: *FEVER GREATER THAN 100.4 F (38 C) OR HIGHER *CHILLS OR SWEATING *NAUSEA AND VOMITING THAT IS NOT CONTROLLED WITH YOUR NAUSEA MEDICATION *UNUSUAL SHORTNESS OF BREATH *UNUSUAL BRUISING OR BLEEDING *URINARY PROBLEMS (pain or burning when urinating, or frequent urination) *BOWEL PROBLEMS (unusual diarrhea, constipation, pain near the anus) TENDERNESS IN MOUTH AND THROAT WITH OR WITHOUT PRESENCE OF ULCERS (sore throat, sores in mouth, or a toothache) UNUSUAL RASH, SWELLING OR PAIN  UNUSUAL VAGINAL DISCHARGE OR ITCHING   Items with * indicate a potential emergency and should be followed up as soon as possible or go to the Emergency Department if any problems should occur.  Please show the CHEMOTHERAPY ALERT CARD or IMMUNOTHERAPY ALERT CARD at check-in  to the Emergency Department and triage nurse.  Should you have questions after your visit or need to cancel or reschedule your appointment, please contact [facility] CANCER [place] AT [place]  [phone] and follow the prompts.  Office hours are 8:00 a.m. to 4:30 p.m. Monday - Friday. Please note that voicemails left after 4:00 p.m. may not be returned until the following business day.  We are closed weekends and major holidays. You have access to a nurse at all times for urgent questions. Please call the main number to the clinic [phone] and follow the prompts.  For any non-urgent questions, you may also contact your provider using MyChart. We now offer e-Visits for anyone 99 and older to request care online for non-urgent symptoms. For details visit [email or web address].   Also download the MyChart app! Go to the app store, search "MyChart", open the app, select [place], and log in with your MyChart username and password.  Due to Covid, a mask is required upon entering the hospital/clinic. If you do not have a mask, one will be given to you upon arrival. For doctor visits, patients may have 1 support person aged 29 or older with them. For treatment visits, patients cannot have anyone with them due to current Covid guidelines and our immunocompromised population.   Blood Transfusion, Adult, Care After This sheet gives you information about how to care for yourself after your procedure. Your doctor may also give you more specific instructions. If you have problems or questions, contact your doctor. What can I expect after the procedure? After the procedure, it is common to have: Bruising  and soreness at the IV site. A headache. Follow these instructions at home: Insertion site care   Follow instructions from your doctor about how to take care of your insertion site. This is where an IV tube was put into your vein. Make sure you: Wash your hands with soap and water before and  after you change your bandage (dressing). If you cannot use soap and water, use hand sanitizer. Change your bandage as told by your doctor. Check your insertion site every day for signs of infection. Check for: Redness, swelling, or pain. Bleeding from the site. Warmth. Pus or a bad smell. General instructions Take over-the-counter and prescription medicines only as told by your doctor. Rest as told by your doctor. Go back to your normal activities as told by your doctor. Keep all follow-up visits as told by your doctor. This is important. Contact a doctor if: You have itching or red, swollen areas of skin (hives). You feel worried or nervous (anxious). You feel weak after doing your normal activities. You have redness, swelling, warmth, or pain around the insertion site. You have blood coming from the insertion site, and the blood does not stop with pressure. You have pus or a bad smell coming from the insertion site. Get help right away if: You have signs of a serious reaction. This may be coming from an allergy or the body's defense system (immune system). Signs include: Trouble breathing or shortness of breath. Swelling of the face or feeling warm (flushed). Fever or chills. Head, chest, or back pain. Dark pee (urine) or blood in the pee. Widespread rash. Fast heartbeat. Feeling dizzy or light-headed. You may receive your blood transfusion in an outpatient setting. If so, you will be told whom to contact to report any reactions. These symptoms may be an emergency. Do not wait to see if the symptoms will go away. Get medical help right away. Call your local emergency services (911 in the [place] Do not drive yourself to the hospital. Summary Bruising and soreness at the IV site are common. Check your insertion site every day for signs of infection. Rest as told by your doctor. Go back to your normal activities as told by your doctor. Get help right away if you have signs of a  serious reaction. This information is not intended to replace advice given to you by your health care provider. Make sure you discuss any questions you have with your health care provider. Document Revised: 04/29/2020 Document Reviewed: 06/27/2018 Elsevier Patient Education  [place].

## 2021-02-08 LAB — TYPE AND SCREEN
ABO/RH(D): O POS
ABO/RH(D): O POS
ABO/RH(D): O POS
Antibody Screen: POSITIVE
Antibody Screen: POSITIVE
Antibody Screen: POSITIVE
DAT, IgG: POSITIVE
DAT, IgG: POSITIVE
DAT, IgG: POSITIVE
DAT, complement: POSITIVE
DAT, complement: POSITIVE
DAT, complement: POSITIVE
Unit division: 0
Unit division: 0
Unit division: 0
Unit division: 0
Unit division: 0
Unit division: 0
Unit division: 0

## 2021-02-08 LAB — BPAM RBC
Blood Product Expiration Date: 202212291552
Blood Product Expiration Date: 202212291625
Blood Product Expiration Date: 202212301425
Blood Product Expiration Date: 202212301517
Blood Product Expiration Date: 202301132235
Blood Product Expiration Date: 202301132315
Blood Product Expiration Date: 202301202310
ISSUE DATE / TIME: 202212290039
ISSUE DATE / TIME: 202212290328
ISSUE DATE / TIME: 202212292239
ISSUE DATE / TIME: 202212300149
ISSUE DATE / TIME: 202301131004
ISSUE DATE / TIME: 202301131125
ISSUE DATE / TIME: 202301200946
Unit Type and Rh: 5100
Unit Type and Rh: 5100
Unit Type and Rh: 5100
Unit Type and Rh: 5100
Unit Type and Rh: 5100
Unit Type and Rh: 9500
Unit Type and Rh: 5100

## 2021-02-09 ENCOUNTER — Encounter: Payer: Self-pay | Admitting: Oncology

## 2021-02-09 ENCOUNTER — Inpatient Hospital Stay: Payer: Medicare HMO | Admitting: Oncology

## 2021-02-09 ENCOUNTER — Inpatient Hospital Stay: Payer: Medicare HMO

## 2021-02-09 ENCOUNTER — Other Ambulatory Visit: Payer: Self-pay

## 2021-02-09 VITALS — BP 130/48 | HR 71 | Resp 16 | Wt 322.5 lb

## 2021-02-09 DIAGNOSIS — D469 Myelodysplastic syndrome, unspecified: Secondary | ICD-10-CM | POA: Diagnosis not present

## 2021-02-09 DIAGNOSIS — D802 Selective deficiency of immunoglobulin A [IgA]: Secondary | ICD-10-CM | POA: Diagnosis not present

## 2021-02-09 LAB — CBC WITH DIFFERENTIAL/PLATELET
Abs Immature Granulocytes: 0.06 10*3/uL (ref 0.00–0.07)
Basophils Absolute: 0 10*3/uL (ref 0.0–0.1)
Basophils Relative: 0 %
Eosinophils Absolute: 0 10*3/uL (ref 0.0–0.5)
Eosinophils Relative: 0 %
HCT: 23.8 % — ABNORMAL LOW (ref 39.0–52.0)
Hemoglobin: 7.8 g/dL — ABNORMAL LOW (ref 13.0–17.0)
Immature Granulocytes: 2 %
Lymphocytes Relative: 6 %
Lymphs Abs: 0.2 10*3/uL — ABNORMAL LOW (ref 0.7–4.0)
MCH: 33.1 pg (ref 26.0–34.0)
MCHC: 32.8 g/dL (ref 30.0–36.0)
MCV: 100.8 fL — ABNORMAL HIGH (ref 80.0–100.0)
Monocytes Absolute: 0.4 10*3/uL (ref 0.1–1.0)
Monocytes Relative: 13 %
Neutro Abs: 2.2 10*3/uL (ref 1.7–7.7)
Neutrophils Relative %: 79 %
Platelets: 289 10*3/uL (ref 150–400)
RBC: 2.36 MIL/uL — ABNORMAL LOW (ref 4.22–5.81)
RDW: 18.9 % — ABNORMAL HIGH (ref 11.5–15.5)
WBC: 2.8 10*3/uL — ABNORMAL LOW (ref 4.0–10.5)
nRBC: 0 % (ref 0.0–0.2)

## 2021-02-10 LAB — SAMPLE TO BLOOD BANK

## 2021-02-11 ENCOUNTER — Inpatient Hospital Stay: Payer: Medicare HMO

## 2021-02-11 ENCOUNTER — Other Ambulatory Visit: Payer: Self-pay

## 2021-02-11 DIAGNOSIS — D802 Selective deficiency of immunoglobulin A [IgA]: Secondary | ICD-10-CM | POA: Diagnosis not present

## 2021-02-11 DIAGNOSIS — D469 Myelodysplastic syndrome, unspecified: Secondary | ICD-10-CM

## 2021-02-11 MED ORDER — SODIUM CHLORIDE 0.9% IV SOLUTION
250.0000 mL | Freq: Once | INTRAVENOUS | Status: AC
  Administered 2021-02-11: 250 mL via INTRAVENOUS
  Filled 2021-02-11: qty 250

## 2021-02-11 MED ORDER — ACETAMINOPHEN 325 MG PO TABS
650.0000 mg | ORAL_TABLET | Freq: Once | ORAL | Status: AC
  Administered 2021-02-11: 650 mg via ORAL
  Filled 2021-02-11: qty 2

## 2021-02-11 MED ORDER — DIPHENHYDRAMINE HCL 50 MG/ML IJ SOLN
25.0000 mg | Freq: Once | INTRAMUSCULAR | Status: AC
  Administered 2021-02-11: 25 mg via INTRAVENOUS
  Filled 2021-02-11: qty 1

## 2021-02-11 MED ORDER — METHYLPREDNISOLONE SODIUM SUCC 125 MG IJ SOLR
40.0000 mg | Freq: Once | INTRAMUSCULAR | Status: AC
  Administered 2021-02-11: 40 mg via INTRAVENOUS
  Filled 2021-02-11: qty 2

## 2021-02-11 NOTE — Patient Instructions (Signed)
[facility] CANCER CTR AT [place]  Discharge Instructions: Thank you for choosing [facility] to provide your oncology and hematology care.  If you have a lab appointment with the [facility], please go directly to the [facility] and check in at the registration area.  Wear comfortable clothing and clothing appropriate for easy access to any Portacath or PICC line.   We strive to give you quality time with your provider. You may need to reschedule your appointment if you arrive late (15 or more minutes).  Arriving late affects you and other patients whose appointments are after yours.  Also, if you miss three or more appointments without notifying the office, you may be dismissed from the clinic at the providers discretion.      For prescription refill requests, have your pharmacy contact our office and allow 72 hours for refills to be completed.    Today you received the following : Blood transfusion    Blood Transfusion, Adult, Care After This sheet gives you information about how to care for yourself after your procedure. Your doctor may also give you more specific instructions. If you have problems or questions, contact your doctor. What can I expect after the procedure? After the procedure, it is common to have: Bruising and soreness at the IV site. A headache. Follow these instructions at home: Insertion site care   Follow instructions from your doctor about how to take care of your insertion site. This is where an IV tube was put into your vein. Make sure you: Wash your hands with soap and water before and after you change your bandage (dressing). If you cannot use soap and water, use hand sanitizer. Change your bandage as told by your doctor. Check your insertion site every day for signs of infection. Check for: Redness, swelling, or pain. Bleeding from the site. Warmth. Pus or a bad smell. General instructions Take over-the-counter and  prescription medicines only as told by your doctor. Rest as told by your doctor. Go back to your normal activities as told by your doctor. Keep all follow-up visits as told by your doctor. This is important. Contact a doctor if: You have itching or red, swollen areas of skin (hives). You feel worried or nervous (anxious). You feel weak after doing your normal activities. You have redness, swelling, warmth, or pain around the insertion site. You have blood coming from the insertion site, and the blood does not stop with pressure. You have pus or a bad smell coming from the insertion site. Get help right away if: You have signs of a serious reaction. This may be coming from an allergy or the body's defense system (immune system). Signs include: Trouble breathing or shortness of breath. Swelling of the face or feeling warm (flushed). Fever or chills. Head, chest, or back pain. Dark pee (urine) or blood in the pee. Widespread rash. Fast heartbeat. Feeling dizzy or light-headed. You may receive your blood transfusion in an outpatient setting. If so, you will be told whom to contact to report any reactions. These symptoms may be an emergency. Do not wait to see if the symptoms will go away. Get medical help right away. Call your local emergency services (911 in the [place] Do not drive yourself to the hospital. Summary Bruising and soreness at the IV site are common. Check your insertion site every day for signs of infection. Rest as told by your doctor. Go back to your normal activities as told by your doctor. Get  help right away if you have signs of a serious reaction. This information is not intended to replace advice given to you by your health care provider. Make sure you discuss any questions you have with your health care provider. Document Revised: 04/29/2020 Document Reviewed: 06/27/2018 Elsevier Patient Education  [place].    To help prevent nausea and vomiting after  your treatment, we encourage you to take your nausea medication as directed.  BELOW ARE SYMPTOMS THAT SHOULD BE REPORTED IMMEDIATELY: *FEVER GREATER THAN 100.4 F (38 C) OR HIGHER *CHILLS OR SWEATING *NAUSEA AND VOMITING THAT IS NOT CONTROLLED WITH YOUR NAUSEA MEDICATION *UNUSUAL SHORTNESS OF BREATH *UNUSUAL BRUISING OR BLEEDING *URINARY PROBLEMS (pain or burning when urinating, or frequent urination) *BOWEL PROBLEMS (unusual diarrhea, constipation, pain near the anus) TENDERNESS IN MOUTH AND THROAT WITH OR WITHOUT PRESENCE OF ULCERS (sore throat, sores in mouth, or a toothache) UNUSUAL RASH, SWELLING OR PAIN  UNUSUAL VAGINAL DISCHARGE OR ITCHING   Items with * indicate a potential emergency and should be followed up as soon as possible or go to the Emergency Department if any problems should occur.  Please show the CHEMOTHERAPY ALERT CARD or IMMUNOTHERAPY ALERT CARD at check-in to the Emergency Department and triage nurse.  Should you have questions after your visit or need to cancel or reschedule your appointment, please contact [facility] CANCER [place] AT [place]  [phone] and follow the prompts.  Office hours are 8:00 a.m. to 4:30 p.m. Monday - Friday. Please note that voicemails left after 4:00 p.m. may not be returned until the following business day.  We are closed weekends and major holidays. You have access to a nurse at all times for urgent questions. Please call the main number to the clinic [phone] and follow the prompts.  For any non-urgent questions, you may also contact your provider using MyChart. We now offer e-Visits for anyone 76 and older to request care online for non-urgent symptoms. For details visit [email or web address].   Also download the MyChart app! Go to the app store, search "MyChart", open the app, select [place], and log in with your MyChart username and password.  Due to Covid, a mask is required upon entering the hospital/clinic. If  you do not have a mask, one will be given to you upon arrival. For doctor visits, patients may have 1 support person aged 45 or older with them. For treatment visits, patients cannot have anyone with them due to current Covid guidelines and our immunocompromised population.

## 2021-02-12 LAB — TYPE AND SCREEN
ABO/RH(D): O POS
Antibody Screen: POSITIVE
DAT, IgG: POSITIVE
DAT, complement: POSITIVE
Unit division: 0

## 2021-02-12 LAB — BPAM RBC
Blood Product Expiration Date: 202301272225
ISSUE DATE / TIME: 202301271010
Unit Type and Rh: 5100

## 2021-02-14 LAB — PREPARE RBC (CROSSMATCH)

## 2021-02-15 NOTE — Progress Notes (Signed)
[facility]  [phone] Fax:(336) [phone]  ID: Dorathy Kinsman OB: 1965-10-09  MR#: [id number]  HWE#:[id number]  Patient Care Team: Sofie Hartigan, MD as PCP - General (Family Medicine) Lloyd Huger, MD as Consulting Physician (Oncology)  CHIEF COMPLAINT: IgA deficiency and MDS.  INTERVAL HISTORY: Patient returns to clinic today for repeat laboratory work, further evaluation, and consideration of additional blood.  He no longer complains of cough.  His weakness and fatigue have improved and he is not in a wheelchair today.  He has no neurologic complaints.  He denies any recent fevers.  He has a fair appetite, but denies weight loss.  He has no chest pain, shortness of breath, or hemoptysis.  He denies any nausea, vomiting, constipation, or diarrhea.  He has no further melena or hematochezia.  He has no urinary complaints.  Patient offers no further specific complaints today.  REVIEW OF SYSTEMS:   Review of Systems  Constitutional: Negative.  Negative for fever, malaise/fatigue and weight loss.  Respiratory: Negative.  Negative for cough, hemoptysis and shortness of breath.   Cardiovascular: Negative.  Negative for chest pain and leg swelling.  Gastrointestinal: Negative.  Negative for abdominal pain, blood in stool and melena.  Genitourinary: Negative.  Negative for hematuria.  Musculoskeletal: Negative.  Negative for back pain.  Skin: Negative.  Negative for rash.  Neurological: Negative.  Negative for dizziness, focal weakness, weakness and headaches.  Psychiatric/Behavioral: Negative.  The patient is not nervous/anxious.    As per HPI. Otherwise, a complete review of systems is negative.  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[place])    Morbid obesity ([place])    Sleep apnea     PAST SURGICAL HISTORY: Past Surgical History:  Procedure Laterality Date   COLONOSCOPY WITH PROPOFOL N/A 07/13/2020   Procedure: COLONOSCOPY WITH PROPOFOL;  Surgeon: Lesly Rubenstein, MD;  Location: [facility] ENDOSCOPY;  Service: Endoscopy;  Laterality: N/A;   COLONOSCOPY WITH PROPOFOL N/A 12/23/2020   Procedure: COLONOSCOPY WITH PROPOFOL;  Surgeon: Lucilla Lame, MD;  Location: [facility] ENDOSCOPY;  Service: Endoscopy;  Laterality: N/A;   ESOPHAGOGASTRODUODENOSCOPY (EGD) WITH PROPOFOL N/A 12/23/2020   Procedure: ESOPHAGOGASTRODUODENOSCOPY (EGD) WITH PROPOFOL;  Surgeon: Lucilla Lame, MD;  Location: [facility] ENDOSCOPY;  Service: Endoscopy;  Laterality: N/A;   GIVENS CAPSULE STUDY N/A 12/29/2020   Procedure: GIVENS CAPSULE STUDY;  Surgeon: Lin Landsman, MD;  Location: [facility] ENDOSCOPY;  Service: Gastroenterology;  Laterality: N/A;   TOOTH EXTRACTION      FAMILY HISTORY: Family History  Problem Relation Age of Onset   Benign prostatic hyperplasia Father    Psoriasis Father    Hypertension Mother    Hyperlipidemia Mother    Diabetes Maternal Grandmother    Diabetes Paternal Grandmother     ADVANCED DIRECTIVES (Y/N):  N  HEALTH MAINTENANCE: Social History   Tobacco Use   Smoking status: Never   Smokeless tobacco: Never  Vaping Use   Vaping Use: Never used  Substance Use Topics   Alcohol use: No   Drug use: No     Colonoscopy:  PAP:  Bone density:  Lipid panel:  Allergies  Allergen Reactions   Ampicillin Diarrhea   Bactrim [Sulfamethoxazole-Trimethoprim]  Hives   Cephalexin    Claritin [Loratadine] Other (See Comments)    Irritates throat   Penicillins Rash    Current Outpatient Medications  Medication Sig Dispense Refill   acarbose (PRECOSE) 25 MG tablet Take 25 mg by mouth 3 (three) times daily with meals.     amLODipine (NORVASC) 10 MG tablet Take 10 mg by mouth daily.      dorzolamide-timolol (COSOPT) 22.3-6.8 MG/ML ophthalmic solution Place 1 drop into the right eye 2 (two) times daily.     FARXIGA 10 MG TABS tablet Take 10 mg by mouth daily.     fenofibrate 160 MG tablet Take 160 mg by mouth daily.     ferrous sulfate 325 (65 FE) MG EC tablet Take 1 tablet (325 mg total) by mouth 2 (two) times daily. 60 tablet 3   fexofenadine (ALLEGRA) 180 MG tablet Take 180 mg by mouth daily.     fluticasone (FLONASE) 50 MCG/ACT nasal spray Place 2 sprays into both nostrils daily as needed.     glimepiride (AMARYL) 4 MG tablet Take 4 mg by mouth daily with breakfast.     HIBICLENS 4 % external liquid APPLY TOPICALLY DAILY AS NEEDED. TO WASHLEGS AND GROIN AREA 120 mL 3   Hydrocortisone-Aloe 1 % CREA Apply 1 application topically 2 (two) times daily as needed (itching). To itchy area on legs     ketoconazole (NIZORAL) 2 % cream Apply to rash in groin once a day as needed for flares. 60 g 5   lidocaine (LMX) 4 % cream Apply 1 application topically as needed.     lovastatin (MEVACOR) 10 MG tablet Take 10 mg by mouth daily.     metFORMIN (GLUCOPHAGE-XR) 500 MG 24 hr tablet Take 500-1,000 mg by mouth 2 (two) times daily. Takes 1 tablet with breakfast and 2 tablets with supper     metoprolol tartrate (LOPRESSOR) 50 MG tablet Take 50 mg by mouth 2 (two) times daily.     mupirocin ointment (BACTROBAN) 2 % Apply to boils and ulcers QD PRN flares. 22 g 3   omeprazole (PRILOSEC) 20 MG capsule Take 20 mg by mouth daily.     pioglitazone (ACTOS) 30 MG tablet Take 30 mg by mouth daily.     ramipril (ALTACE) 10 MG capsule Take 10 mg by mouth 2 (two) times daily.     spironolactone (ALDACTONE) 50 MG tablet Take 50 mg by mouth daily.     terazosin (HYTRIN) 10 MG capsule Take 10 mg by mouth daily.      torsemide (DEMADEX) 20 MG tablet Take 20 mg by mouth daily.     traZODone (DESYREL) 150 MG tablet Take 150 mg by mouth at bedtime.     TRESIBA FLEXTOUCH 200 UNIT/ML FlexTouch Pen Inject 32 Units  into the skin in the morning.     trolamine salicylate (ASPERCREME) 10 % cream Apply 1 application topically as needed for muscle pain.     vitamin B-12 (CYANOCOBALAMIN) 1000 MCG tablet Take 1,000 mcg by mouth daily.     No current facility-administered medications for this visit.    OBJECTIVE: Vitals:   02/16/21 0900  BP: (!) 154/55  Pulse: 77  Resp: 18  Temp: 98.3 F (36.8 C)  SpO2: 100%     Body mass index is 51.51 kg/m.    ECOG FS:1 - Symptomatic but completely ambulatory  General: Well-developed, well-nourished, no acute distress. Eyes: Pink conjunctiva, anicteric sclera. HEENT: Normocephalic, moist mucous membranes. Lungs: No audible wheezing  or coughing. Heart: Regular rate and rhythm. Abdomen: Soft, nontender, no obvious distention. Musculoskeletal: No edema, cyanosis, or clubbing. Neuro: Alert, answering all questions appropriately. Cranial nerves grossly intact. Skin: No rashes or petechiae noted. Psych: Normal affect.  LAB RESULTS:  Lab Results  Component Value Date   NA 136 01/20/2021   K 3.3 (L) 01/20/2021   CL 99 01/20/2021   CO2 26 01/20/2021   GLUCOSE 106 (H) 01/20/2021   BUN 41 (H) 01/20/2021   CREATININE 1.12 01/20/2021   CALCIUM 8.8 (L) 01/20/2021   PROT 6.8 01/20/2021   ALBUMIN 3.4 (L) 01/20/2021   AST 14 (L) 01/20/2021   ALT 13 01/20/2021   ALKPHOS 36 (L) 01/20/2021   BILITOT 1.0 01/20/2021   GFRNONAA >60 01/20/2021   GFRAA >60 10/25/2016    Lab Results  Component Value Date   WBC 2.8 (L) 02/16/2021   NEUTROABS 2.1 02/16/2021   HGB 8.2 (L) 02/16/2021   HCT 25.1 (L) 02/16/2021   MCV 103.3 (H) 02/16/2021   PLT 255 02/16/2021     STUDIES: No results found.  ASSESSMENT: IgA deficiency and MDS.   PLAN:    IgA deficiency: Patient reports he was diagnosed as a child, but has not had repeated infections throughout adulthood.  Recent and repeat IgA levels were undetectable with a normal IgM and IgG.  Patient also has anti-IgA  antibodies.  Patients with isolated IgA deficiency can have anaphylactoid reactions to blood transfusions.   MDS: Bone marrow biopsy on January 03, 2021 revealed a hypercellular marrow with erythroid hyperplasia and dyserythropoiesis.  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  No blasts, increased plasma cells, or clonality was noted.  Cytogenetics were reported as normal.  Patient will have second opinion consultation at [facility] in the near future.  Can consider initiating Vidaza to reduce transfusion requirement. Anemia: Hemoglobin improved to 8.2 with 1 unit of packed red blood cells last week. Plan to transfuse if hemoglobin is below 8.0.  He does not require transfusion this week.  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  Premedications include Benadryl, Tylenol, and Solu-Medrol. Renal insufficiency: Resolved. Hypokalemia: Mild, monitor. Leukopenia: Chronic and unchanged.  Likely secondary to underlying MDS. COVID: Essentially resolved. Disposition: Return to clinic in 1 week with repeat laboratory work and consideration of blood 2 days later and then in 2 weeks for further evaluation and consideration of blood.     Patient expressed understanding and was in agreement with this plan. He also understands that He can call clinic at any time with any questions, concerns, or complaints.    Lloyd Huger, MD   02/16/2021 10:13 AM

## 2021-02-16 ENCOUNTER — Inpatient Hospital Stay: Payer: Medicare HMO | Admitting: Oncology

## 2021-02-16 ENCOUNTER — Inpatient Hospital Stay: Payer: Medicare HMO | Attending: Oncology

## 2021-02-16 ENCOUNTER — Telehealth: Payer: Self-pay

## 2021-02-16 ENCOUNTER — Other Ambulatory Visit: Payer: Self-pay

## 2021-02-16 VITALS — BP 154/55 | HR 77 | Temp 98.3°F | Resp 18 | Wt 328.9 lb

## 2021-02-16 DIAGNOSIS — D469 Myelodysplastic syndrome, unspecified: Secondary | ICD-10-CM

## 2021-02-16 DIAGNOSIS — E876 Hypokalemia: Secondary | ICD-10-CM | POA: Insufficient documentation

## 2021-02-16 DIAGNOSIS — D802 Selective deficiency of immunoglobulin A [IgA]: Secondary | ICD-10-CM

## 2021-02-16 LAB — CBC WITH DIFFERENTIAL/PLATELET
Abs Immature Granulocytes: 0.07 10*3/uL (ref 0.00–0.07)
Basophils Absolute: 0 10*3/uL (ref 0.0–0.1)
Basophils Relative: 0 %
Eosinophils Absolute: 0 10*3/uL (ref 0.0–0.5)
Eosinophils Relative: 0 %
HCT: 25.1 % — ABNORMAL LOW (ref 39.0–52.0)
Hemoglobin: 8.2 g/dL — ABNORMAL LOW (ref 13.0–17.0)
Immature Granulocytes: 3 %
Lymphocytes Relative: 8 %
Lymphs Abs: 0.2 10*3/uL — ABNORMAL LOW (ref 0.7–4.0)
MCH: 33.7 pg (ref 26.0–34.0)
MCHC: 32.7 g/dL (ref 30.0–36.0)
MCV: 103.3 fL — ABNORMAL HIGH (ref 80.0–100.0)
Monocytes Absolute: 0.4 10*3/uL (ref 0.1–1.0)
Monocytes Relative: 14 %
Neutro Abs: 2.1 10*3/uL (ref 1.7–7.7)
Neutrophils Relative %: 75 %
Platelets: 255 10*3/uL (ref 150–400)
RBC: 2.43 MIL/uL — ABNORMAL LOW (ref 4.22–5.81)
RDW: 19.9 % — ABNORMAL HIGH (ref 11.5–15.5)
WBC: 2.8 10*3/uL — ABNORMAL LOW (ref 4.0–10.5)
nRBC: 0 % (ref 0.0–0.2)

## 2021-02-16 LAB — SAMPLE TO BLOOD BANK

## 2021-02-16 NOTE — Progress Notes (Signed)
Pt ambulatory to room with walker whereas normally he is in wheelchair. Has no complaints at this time

## 2021-02-16 NOTE — Telephone Encounter (Signed)
Spoke with [facility] hematology about referral placed for patient. They have received it and they are working on it.

## 2021-02-18 ENCOUNTER — Inpatient Hospital Stay: Payer: Medicare HMO

## 2021-02-23 ENCOUNTER — Other Ambulatory Visit: Payer: Self-pay

## 2021-02-23 ENCOUNTER — Inpatient Hospital Stay: Payer: Medicare HMO

## 2021-02-23 ENCOUNTER — Other Ambulatory Visit: Payer: Self-pay | Admitting: Oncology

## 2021-02-23 ENCOUNTER — Telehealth: Payer: Self-pay | Admitting: *Deleted

## 2021-02-23 DIAGNOSIS — D469 Myelodysplastic syndrome, unspecified: Secondary | ICD-10-CM

## 2021-02-23 DIAGNOSIS — D802 Selective deficiency of immunoglobulin A [IgA]: Secondary | ICD-10-CM | POA: Diagnosis not present

## 2021-02-23 LAB — CBC WITH DIFFERENTIAL/PLATELET
Abs Immature Granulocytes: 0.14 10*3/uL — ABNORMAL HIGH (ref 0.00–0.07)
Basophils Absolute: 0 10*3/uL (ref 0.0–0.1)
Basophils Relative: 1 %
Eosinophils Absolute: 0 10*3/uL (ref 0.0–0.5)
Eosinophils Relative: 0 %
HCT: 23.7 % — ABNORMAL LOW (ref 39.0–52.0)
Hemoglobin: 7.9 g/dL — ABNORMAL LOW (ref 13.0–17.0)
Immature Granulocytes: 4 %
Lymphocytes Relative: 6 %
Lymphs Abs: 0.3 10*3/uL — ABNORMAL LOW (ref 0.7–4.0)
MCH: 35 pg — ABNORMAL HIGH (ref 26.0–34.0)
MCHC: 33.3 g/dL (ref 30.0–36.0)
MCV: 104.9 fL — ABNORMAL HIGH (ref 80.0–100.0)
Monocytes Absolute: 0.4 10*3/uL (ref 0.1–1.0)
Monocytes Relative: 9 %
Neutro Abs: 3.3 10*3/uL (ref 1.7–7.7)
Neutrophils Relative %: 80 %
Platelets: 265 10*3/uL (ref 150–400)
RBC: 2.26 MIL/uL — ABNORMAL LOW (ref 4.22–5.81)
RDW: 19.6 % — ABNORMAL HIGH (ref 11.5–15.5)
WBC: 4.1 10*3/uL (ref 4.0–10.5)
nRBC: 0 % (ref 0.0–0.2)

## 2021-02-23 LAB — SAMPLE TO BLOOD BANK

## 2021-02-23 NOTE — Telephone Encounter (Signed)
Patient informed that he does need to come in Friday. He agrees to this

## 2021-02-23 NOTE — Telephone Encounter (Signed)
Patient called stating he sees his hgb is 7.9 and is asking if he is to keep his appointment for Friday for blood transfusion or not. Please advise  Notes           Component Ref Range & Units 09:12 (02/23/21) 7 d ago (02/16/21) 2 wk ago (02/09/21) 3 wk ago (02/02/21) 4 wk ago (01/26/21) 1 mo ago (01/20/21) 1 mo ago (01/16/21)  WBC 4.0 - 10.5 K/uL 4.1  2.8 Low   2.8 Low   3.2 Low   3.0 Low   3.1 Low   2.8 Low    RBC 4.22 - 5.81 MIL/uL 2.26 Low   2.43 Low   2.36 Low   2.21 Low   2.01 Low   2.47 Low   2.56 Low    Hemoglobin 13.0 - 17.0 g/dL 7.9 Low   8.2 Low   7.8 Low   7.2 Low   6.6 Low   8.2 Low   8.5 Low    HCT 39.0 - 52.0 % 23.7 Low   25.1 Low   23.8 Low   21.9 Low   19.9 Low   24.1 Low   24.8 Low    MCV 80.0 - 100.0 fL 104.9 High   103.3 High   100.8 High   99.1  99.0  97.6  96.9   MCH 26.0 - 34.0 pg 35.0 High   33.7  33.1  32.6  32.8  33.2  33.2   MCHC 30.0 - 36.0 g/dL 33.3  32.7  32.8  32.9  33.2  34.0  34.3   RDW 11.5 - 15.5 % 19.6 High   19.9 High   18.9 High   18.0 High   17.8 High   17.4 High   18.6 High    Platelets 150 - 400 K/uL 265  255  289  336  211  205  311   nRBC 0.0 - 0.2 % 0.0  0.0  0.0  0.0  0.0  0.0  0.0 CM   Neutrophils Relative % % 80  75  79  84  81  78    Neutro Abs 1.7 - 7.7 K/uL 3.3  2.1  2.2  2.7  2.4  2.4    Lymphocytes Relative % 6  8  6  6  5  9     Lymphs Abs 0.7 - 4.0 K/uL 0.3 Low   0.2 Low   0.2 Low   0.2 Low   0.1 Low   0.3 Low     Monocytes Relative % 9  14  13  8  11  12     Monocytes Absolute 0.1 - 1.0 K/uL 0.4  0.4  0.4  0.3  0.3  0.4    Eosinophils Relative % 0  0  0  0  0  0    Eosinophils Absolute 0.0 - 0.5 K/uL 0.0  0.0  0.0  0.0  0.0  0.0    Basophils Relative % 1  0  0  0  0  0    Basophils Absolute 0.0 - 0.1 K/uL 0.0  0.0  0.0  0.0  0.0  0.0    Immature Granulocytes % 4  3  2  2  3  1     Abs Immature Granulocytes 0.00 - 0.07 K/uL 0.14 High   0.07 CM  0.06 CM  0.06 CM  0.08 High  CM  0.02 CM    Comment: Performed at [facility],  [place], [place] [address]  Resulting Agency  [place] [facility] [place] [facility] [place] [facility] [place] [facility] [place] [facility] [place] [facility] [place] [facility]         Specimen Collected: 02/23/21 09:12 Last Resulted: 02/23/21 09:28

## 2021-02-25 ENCOUNTER — Inpatient Hospital Stay: Payer: Medicare HMO

## 2021-02-25 ENCOUNTER — Other Ambulatory Visit: Payer: Self-pay

## 2021-02-25 DIAGNOSIS — D469 Myelodysplastic syndrome, unspecified: Secondary | ICD-10-CM

## 2021-02-25 DIAGNOSIS — D802 Selective deficiency of immunoglobulin A [IgA]: Secondary | ICD-10-CM | POA: Diagnosis not present

## 2021-02-25 LAB — PREPARE RBC (CROSSMATCH)

## 2021-02-25 MED ORDER — ACETAMINOPHEN 325 MG PO TABS
650.0000 mg | ORAL_TABLET | Freq: Once | ORAL | Status: AC
  Administered 2021-02-25: 650 mg via ORAL
  Filled 2021-02-25: qty 2

## 2021-02-25 MED ORDER — METHYLPREDNISOLONE SODIUM SUCC 125 MG IJ SOLR
40.0000 mg | Freq: Once | INTRAMUSCULAR | Status: AC
  Administered 2021-02-25: 40 mg via INTRAVENOUS
  Filled 2021-02-25: qty 2

## 2021-02-25 MED ORDER — SODIUM CHLORIDE 0.9% IV SOLUTION
250.0000 mL | Freq: Once | INTRAVENOUS | Status: AC
  Administered 2021-02-25: 250 mL via INTRAVENOUS
  Filled 2021-02-25: qty 250

## 2021-02-25 MED ORDER — DIPHENHYDRAMINE HCL 50 MG/ML IJ SOLN
25.0000 mg | Freq: Once | INTRAMUSCULAR | Status: AC
  Administered 2021-02-25: 25 mg via INTRAVENOUS
  Filled 2021-02-25: qty 1

## 2021-02-25 NOTE — Patient Instructions (Signed)
Blood Transfusion, Adult A blood transfusion is a procedure in which you receive blood or a type of blood cell (blood component) through an IV. You may need a blood transfusion when your blood level is low. This may result from a bleeding disorder, illness, injury, or surgery. The blood may come from a donor. You may also be able to donate blood for yourself (autologous blood donation) before a planned surgery. The blood given in a transfusion is made up of different blood components. You may receive: Red blood cells. These carry oxygen to the cells in the body. Platelets. These help your blood to clot. Plasma. This is the liquid part of your blood. It carries proteins and other substances throughout the body. White blood cells. These help you fight infections. If you have hemophilia or another clotting disorder, you may also receive other types of blood products. Tell a health care provider about: Any blood disorders you have. Any previous reactions you have had during a blood transfusion. Any allergies you have. All medicines you are taking, including vitamins, herbs, eye drops, creams, and over-the-counter medicines. Any surgeries you have had. Any medical conditions you have, including any recent fever or cold symptoms. Whether you are pregnant or may be pregnant. What are the risks? Generally, this is a safe procedure. However, problems may occur. The most common problems include: A mild allergic reaction, such as red, swollen areas of skin (hives) and itching. Fever or chills. This may be the body's response to new blood cells received. This may occur during or up to 4 hours after the transfusion. More serious problems may include: Transfusion-associated circulatory overload (TACO), or too much fluid in the lungs. This may cause breathing problems. A serious allergic reaction, such as difficulty breathing or swelling around the face and lips. Transfusion-related acute lung injury  (TRALI), which causes breathing difficulty and low oxygen in the blood. This can occur within hours of the transfusion or several days later. Iron overload. This can happen after receiving many blood transfusions over a period of time. Infection or virus being transmitted. This is rare because donated blood is carefully tested before it is given. Hemolytic transfusion reaction. This is rare. It happens when your body's defense system (immune system)tries to attack the new blood cells. Symptoms may include fever, chills, nausea, low blood pressure, and low back or chest pain. Transfusion-associated graft-versus-host disease (TAGVHD). This is rare. It happens when donated cells attack your body's healthy tissues. What happens before the procedure? Medicines Ask your health care provider about: Changing or stopping your regular medicines. This is especially important if you are taking diabetes medicines or blood thinners. Taking medicines such as aspirin and ibuprofen. These medicines can thin your blood. Do not take these medicines unless your health care provider tells you to take them. Taking over-the-counter medicines, vitamins, herbs, and supplements. General instructions Follow instructions from your health care provider about eating and drinking restrictions. You will have a blood test to determine your blood type. This is necessary to know what kind of blood your body will accept and to match it to the donor blood. If you are going to have a planned surgery, you may be able to do an autologous blood donation. This may be done in case you need to have a transfusion. You will have your temperature, blood pressure, and pulse monitored before the transfusion. If you have had an allergic reaction to a transfusion in the past, you may be given medicine to help prevent   a reaction. This medicine may be given to you by mouth (orally) or through an IV. Set aside time for the blood transfusion. This  procedure generally takes 1-4 hours to complete. What happens during the procedure?  An IV will be inserted into one of your veins. The bag of donated blood will be attached to your IV. The blood will then enter through your vein. Your temperature, blood pressure, and pulse will be monitored regularly during the transfusion. This monitoring is done to detect early signs of a transfusion reaction. Tell your nurse right away if you have any of these symptoms during the transfusion: Shortness of breath or trouble breathing. Chest or back pain. Fever or chills. Hives or itching. If you have any signs or symptoms of a reaction, your transfusion will be stopped and you may be given medicine. When the transfusion is complete, your IV will be removed. Pressure may be applied to the IV site for a few minutes. A bandage (dressing)will be applied. The procedure may vary among health care providers and hospitals. What happens after the procedure? Your temperature, blood pressure, pulse, breathing rate, and blood oxygen level will be monitored until you leave the hospital or clinic. Your blood may be tested to see how you are responding to the transfusion. You may be warmed with fluids or blankets to maintain a normal body temperature. If you receive your blood transfusion in an outpatient setting, you will be told whom to contact to report any reactions. Where to find more information For more information on blood transfusions, visit the [employer]: redcross.org Summary A blood transfusion is a procedure in which you receive blood or a type of blood cell (blood component) through an IV. The blood you receive may come from a donor or be donated by yourself (autologous blood donation) before a planned surgery. The blood given in a transfusion is made up of different blood components. You may receive red blood cells, platelets, plasma, or white blood cells depending on the condition treated. Your  temperature, blood pressure, and pulse will be monitored before, during, and after the transfusion. After the transfusion, your blood may be tested to see how your body has responded. This information is not intended to replace advice given to you by your health care provider. Make sure you discuss any questions you have with your health care provider. Document Revised: 11/07/2018 Document Reviewed: 06/27/2018 Elsevier Patient Education  2022 Elsevier Inc.  

## 2021-02-26 LAB — TYPE AND SCREEN
ABO/RH(D): O POS
Antibody Screen: POSITIVE
DAT, IgG: POSITIVE
DAT, complement: POSITIVE
Unit division: 0

## 2021-02-26 LAB — BPAM RBC
Blood Product Expiration Date: 202302102243
ISSUE DATE / TIME: 202302100937
Unit Type and Rh: 5100

## 2021-02-28 NOTE — Progress Notes (Signed)
[facility]  [phone] [phone]) [phone]  ID: Patrick Duncan OB: December 16, 1965  MR#: [id number]  HYQ#:[id number]  Patient Care Team: Sofie Hartigan, MD as PCP - General (Family Medicine) Lloyd Huger, MD as Consulting Physician (Oncology)  CHIEF COMPLAINT: IgA deficiency and MDS.  INTERVAL HISTORY: Patient returns to clinic today for repeat laboratory, further evaluation, and consideration of additional blood.  He currently feels well and is asymptomatic.  He does not complain of weakness or fatigue today.  He has no neurologic complaints.  He denies any recent fevers.  He has a fair appetite, but denies weight loss.  He has no chest pain, shortness of breath, or hemoptysis.  He denies any nausea, vomiting, constipation, or diarrhea.  He has no further melena or hematochezia.  He has no urinary complaints.  [name]  REVIEW OF SYSTEMS:   Review of Systems  Constitutional: Negative.  Negative for fever, malaise/fatigue and weight loss.  Respiratory: Negative.  Negative for cough, hemoptysis and shortness of breath.   Cardiovascular: Negative.  Negative for chest pain and leg swelling.  Gastrointestinal: Negative.  Negative for abdominal pain, blood in stool and melena.  Genitourinary: Negative.  Negative for hematuria.  Musculoskeletal: Negative.  Negative for back pain.  Skin: Negative.  Negative for rash.  Neurological: Negative.  Negative for dizziness, focal weakness, weakness and headaches.  Psychiatric/Behavioral: Negative.  The patient is not nervous/anxious.    As per HPI. Otherwise, a complete review of systems is negative.  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[place])     Morbid obesity ([place])    Sleep apnea     PAST SURGICAL HISTORY: Past Surgical History:  Procedure Laterality Date   COLONOSCOPY WITH PROPOFOL N/A 07/13/2020   Procedure: COLONOSCOPY WITH PROPOFOL;  Surgeon: Lesly Rubenstein, MD;  Location: [facility] ENDOSCOPY;  Service: Endoscopy;  Laterality: N/A;   COLONOSCOPY WITH PROPOFOL N/A 12/23/2020   Procedure: COLONOSCOPY WITH PROPOFOL;  Surgeon: Lucilla Lame, MD;  Location: [facility] ENDOSCOPY;  Service: Endoscopy;  Laterality: N/A;   ESOPHAGOGASTRODUODENOSCOPY (EGD) WITH PROPOFOL N/A 12/23/2020   Procedure: ESOPHAGOGASTRODUODENOSCOPY (EGD) WITH PROPOFOL;  Surgeon: Lucilla Lame, MD;  Location: [facility] ENDOSCOPY;  Service: Endoscopy;  Laterality: N/A;   GIVENS CAPSULE STUDY N/A 12/29/2020   Procedure: GIVENS CAPSULE STUDY;  Surgeon: Lin Landsman, MD;  Location: [facility] ENDOSCOPY;  Service: Gastroenterology;  Laterality: N/A;   TOOTH EXTRACTION      FAMILY HISTORY: Family History  Problem Relation Age of Onset   Benign prostatic hyperplasia Father    Psoriasis Father    Hypertension Mother    Hyperlipidemia Mother    Diabetes Maternal Grandmother    Diabetes Paternal Grandmother     ADVANCED DIRECTIVES (Y/N):  N  HEALTH MAINTENANCE: Social History   Tobacco Use   Smoking status: Never   Smokeless tobacco: Never  Vaping Use   Vaping Use: Never used  Substance Use Topics   Alcohol use: No   Drug use: No     Colonoscopy:  PAP:  Bone density:  Lipid panel:  Allergies  Allergen Reactions   Ampicillin Diarrhea   Bactrim [Sulfamethoxazole-Trimethoprim] Hives   Cephalexin  Claritin [Loratadine] Other (See Comments)    Irritates throat   Penicillins Rash    Current Outpatient Medications  Medication Sig Dispense Refill   acarbose (PRECOSE) 25 MG tablet Take 25 mg by mouth 3 (three) times daily with meals.     amLODipine (NORVASC) 10 MG tablet Take 10 mg by mouth daily.     dorzolamide-timolol (COSOPT) 22.3-6.8 MG/ML  ophthalmic solution Place 1 drop into the right eye 2 (two) times daily.     FARXIGA 10 MG TABS tablet Take 10 mg by mouth daily.     fenofibrate 160 MG tablet Take 160 mg by mouth daily.     ferrous sulfate 325 (65 FE) MG EC tablet Take 1 tablet (325 mg total) by mouth 2 (two) times daily. 60 tablet 3   fexofenadine (ALLEGRA) 180 MG tablet Take 180 mg by mouth daily.     fluticasone (FLONASE) 50 MCG/ACT nasal spray Place 2 sprays into both nostrils daily as needed.     glimepiride (AMARYL) 4 MG tablet Take 4 mg by mouth daily with breakfast.     HIBICLENS 4 % external liquid APPLY TOPICALLY DAILY AS NEEDED. TO WASHLEGS AND GROIN AREA 120 mL 3   Hydrocortisone-Aloe 1 % CREA Apply 1 application topically 2 (two) times daily as needed (itching). To itchy area on legs     ketoconazole (NIZORAL) 2 % cream Apply to rash in groin once a day as needed for flares. 60 g 5   lidocaine (LMX) 4 % cream Apply 1 application topically as needed.     lovastatin (MEVACOR) 10 MG tablet Take 10 mg by mouth daily.     metFORMIN (GLUCOPHAGE-XR) 500 MG 24 hr tablet Take 500-1,000 mg by mouth 2 (two) times daily. Takes 1 tablet with breakfast and 2 tablets with supper     metoprolol tartrate (LOPRESSOR) 50 MG tablet Take 50 mg by mouth 2 (two) times daily.     mupirocin ointment (BACTROBAN) 2 % Apply to boils and ulcers QD PRN flares. 22 g 3   omeprazole (PRILOSEC) 20 MG capsule Take 20 mg by mouth daily.     pioglitazone (ACTOS) 30 MG tablet Take 30 mg by mouth daily.     ramipril (ALTACE) 10 MG capsule Take 10 mg by mouth 2 (two) times daily.     spironolactone (ALDACTONE) 50 MG tablet Take 50 mg by mouth daily.     terazosin (HYTRIN) 10 MG capsule Take 10 mg by mouth daily.      torsemide (DEMADEX) 20 MG tablet Take 20 mg by mouth daily.     traZODone (DESYREL) 150 MG tablet Take 150 mg by mouth at bedtime.     TRESIBA FLEXTOUCH 200 UNIT/ML FlexTouch Pen Inject 32 Units into the skin in the morning.     trolamine  salicylate (ASPERCREME) 10 % cream Apply 1 application topically as needed for muscle pain.     vitamin B-12 (CYANOCOBALAMIN) 1000 MCG tablet Take 1,000 mcg by mouth daily.     No current facility-administered medications for this visit.    OBJECTIVE: Vitals:   03/02/21 0948  BP: 139/63  Pulse: 72  Resp: 18  Temp: 97.8 F (36.6 C)  SpO2: 100%     Body mass index is 51.47 kg/m.    ECOG FS:1 - Symptomatic but completely ambulatory  General: Well-developed, well-nourished, no acute distress. Eyes: Pink conjunctiva, anicteric sclera. HEENT: Normocephalic, moist mucous membranes. Lungs: No audible wheezing or coughing. Heart: Regular rate and rhythm. Abdomen:  Soft, nontender, no obvious distention. Musculoskeletal: No edema, cyanosis, or clubbing. Neuro: Alert, answering all questions appropriately. Cranial nerves grossly intact. Skin: No rashes or petechiae noted. Psych: Normal affect.  LAB RESULTS:  Lab Results  Component Value Date   NA 136 01/20/2021   K 3.3 (L) 01/20/2021   CL 99 01/20/2021   CO2 26 01/20/2021   GLUCOSE 106 (H) 01/20/2021   BUN 41 (H) 01/20/2021   CREATININE 1.12 01/20/2021   CALCIUM 8.8 (L) 01/20/2021   PROT 6.8 01/20/2021   ALBUMIN 3.4 (L) 01/20/2021   AST 14 (L) 01/20/2021   ALT 13 01/20/2021   ALKPHOS 36 (L) 01/20/2021   BILITOT 1.0 01/20/2021   GFRNONAA >60 01/20/2021   GFRAA >60 10/25/2016    Lab Results  Component Value Date   WBC 4.5 03/02/2021   NEUTROABS 3.7 03/02/2021   HGB 8.8 (L) 03/02/2021   HCT 26.3 (L) 03/02/2021   MCV 104.4 (H) 03/02/2021   PLT 277 03/02/2021     STUDIES: No results found.  ASSESSMENT: IgA deficiency and MDS.   PLAN:    IgA deficiency: Patient reports he was diagnosed as a child, but has not had repeated infections throughout adulthood.  Recent and repeat IgA levels were undetectable with a normal IgM and IgG.  Patient also has anti-IgA antibodies.  Patients with isolated IgA deficiency can have  anaphylactoid reactions to blood transfusions.   MDS: Bone marrow biopsy on January 03, 2021 revealed a hypercellular marrow with erythroid hyperplasia and dyserythropoiesis.  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  No blasts, increased plasma cells, or clonality was noted.  Cytogenetics were reported as normal.  Patient will have second opinion consultation at [facility] in the near future.  Can consider initiating Vidaza to reduce transfusion requirement. Anemia: Hemoglobin improved to 8.7. Plan to transfuse if hemoglobin is below 8.0.  He does not require transfusion this week.  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  Premedications include Benadryl, Tylenol, and Solu-Medrol. Renal insufficiency: Resolved. Hypokalemia: Mild, monitor. Leukopenia: Resolved.   COVID: Resolved.   Disposition: Return to clinic in 1 week with repeat laboratory work and consideration of blood 2 days later and then in 2 weeks for further evaluation and consideration of blood.     Patient expressed understanding and was in agreement with this plan. He also understands that He can call clinic at any time with any questions, concerns, or complaints.    Lloyd Huger, MD   03/03/2021 2:36 PM

## 2021-03-02 ENCOUNTER — Inpatient Hospital Stay: Payer: Medicare HMO

## 2021-03-02 ENCOUNTER — Other Ambulatory Visit: Payer: Self-pay

## 2021-03-02 ENCOUNTER — Inpatient Hospital Stay: Payer: Medicare HMO | Admitting: Oncology

## 2021-03-02 VITALS — BP 139/63 | HR 72 | Temp 97.8°F | Resp 18 | Wt 328.6 lb

## 2021-03-02 DIAGNOSIS — D802 Selective deficiency of immunoglobulin A [IgA]: Secondary | ICD-10-CM

## 2021-03-02 DIAGNOSIS — D469 Myelodysplastic syndrome, unspecified: Secondary | ICD-10-CM

## 2021-03-02 LAB — SAMPLE TO BLOOD BANK

## 2021-03-02 LAB — CBC WITH DIFFERENTIAL/PLATELET
Abs Immature Granulocytes: 0.07 10*3/uL (ref 0.00–0.07)
Basophils Absolute: 0 10*3/uL (ref 0.0–0.1)
Basophils Relative: 0 %
Eosinophils Absolute: 0 10*3/uL (ref 0.0–0.5)
Eosinophils Relative: 0 %
HCT: 26.3 % — ABNORMAL LOW (ref 39.0–52.0)
Hemoglobin: 8.8 g/dL — ABNORMAL LOW (ref 13.0–17.0)
Immature Granulocytes: 2 %
Lymphocytes Relative: 7 %
Lymphs Abs: 0.3 10*3/uL — ABNORMAL LOW (ref 0.7–4.0)
MCH: 34.9 pg — ABNORMAL HIGH (ref 26.0–34.0)
MCHC: 33.5 g/dL (ref 30.0–36.0)
MCV: 104.4 fL — ABNORMAL HIGH (ref 80.0–100.0)
Monocytes Absolute: 0.5 10*3/uL (ref 0.1–1.0)
Monocytes Relative: 10 %
Neutro Abs: 3.7 10*3/uL (ref 1.7–7.7)
Neutrophils Relative %: 81 %
Platelets: 277 10*3/uL (ref 150–400)
RBC: 2.52 MIL/uL — ABNORMAL LOW (ref 4.22–5.81)
RDW: 18.6 % — ABNORMAL HIGH (ref 11.5–15.5)
WBC: 4.5 10*3/uL (ref 4.0–10.5)
nRBC: 0 % (ref 0.0–0.2)

## 2021-03-04 ENCOUNTER — Inpatient Hospital Stay: Payer: Medicare HMO

## 2021-03-09 ENCOUNTER — Other Ambulatory Visit: Payer: Self-pay

## 2021-03-09 ENCOUNTER — Inpatient Hospital Stay: Payer: Medicare HMO

## 2021-03-09 DIAGNOSIS — D802 Selective deficiency of immunoglobulin A [IgA]: Secondary | ICD-10-CM | POA: Diagnosis not present

## 2021-03-09 DIAGNOSIS — D469 Myelodysplastic syndrome, unspecified: Secondary | ICD-10-CM

## 2021-03-09 LAB — CBC WITH DIFFERENTIAL/PLATELET
Abs Immature Granulocytes: 0.08 10*3/uL — ABNORMAL HIGH (ref 0.00–0.07)
Basophils Absolute: 0 10*3/uL (ref 0.0–0.1)
Basophils Relative: 1 %
Eosinophils Absolute: 0 10*3/uL (ref 0.0–0.5)
Eosinophils Relative: 0 %
HCT: 24.7 % — ABNORMAL LOW (ref 39.0–52.0)
Hemoglobin: 8.3 g/dL — ABNORMAL LOW (ref 13.0–17.0)
Immature Granulocytes: 2 %
Lymphocytes Relative: 6 %
Lymphs Abs: 0.3 10*3/uL — ABNORMAL LOW (ref 0.7–4.0)
MCH: 35.5 pg — ABNORMAL HIGH (ref 26.0–34.0)
MCHC: 33.6 g/dL (ref 30.0–36.0)
MCV: 105.6 fL — ABNORMAL HIGH (ref 80.0–100.0)
Monocytes Absolute: 0.5 10*3/uL (ref 0.1–1.0)
Monocytes Relative: 11 %
Neutro Abs: 3.8 10*3/uL (ref 1.7–7.7)
Neutrophils Relative %: 80 %
Platelets: 262 10*3/uL (ref 150–400)
RBC: 2.34 MIL/uL — ABNORMAL LOW (ref 4.22–5.81)
RDW: 18.3 % — ABNORMAL HIGH (ref 11.5–15.5)
WBC: 4.7 10*3/uL (ref 4.0–10.5)
nRBC: 0 % (ref 0.0–0.2)

## 2021-03-09 LAB — SAMPLE TO BLOOD BANK

## 2021-03-10 ENCOUNTER — Telehealth: Payer: Self-pay | Admitting: Emergency Medicine

## 2021-03-10 NOTE — Telephone Encounter (Signed)
Pt informed that based on lab work and parameters he does not need to get blood transfusion tomorrow. Pt states that he will likely need to get blood next week if his hgb continues to drop.

## 2021-03-11 ENCOUNTER — Inpatient Hospital Stay: Payer: Medicare HMO

## 2021-03-11 NOTE — Progress Notes (Signed)
[place]  Telephone:(336) [phone] [phone]) [phone]  ID: Patrick Duncan OB: 1965-03-04  MR#: [id number]  CZY#:[id number]  Patient Care Team: Sofie Hartigan, MD as PCP - General (Family Medicine) Lloyd Huger, MD as Consulting Physician (Oncology)  CHIEF COMPLAINT: IgA deficiency and MDS.  INTERVAL HISTORY: Patient returns to clinic today for repeat laboratory work, further evaluation, and consideration of additional blood.  He has mild weakness and fatigue, but otherwise feels well.  He admits to persistent shortness of breath and has an appointment with pulmonology tomorrow.  He has no neurologic complaints.  He denies any recent fevers.  He has a fair appetite, but denies weight loss.  He has no chest pain, or hemoptysis.  He denies any nausea, vomiting, constipation, or diarrhea.  He has no further melena or hematochezia.  He has no urinary complaints.  [name]  REVIEW OF SYSTEMS:   Review of Systems  Constitutional:  Positive for malaise/fatigue. Negative for fever and weight loss.  Respiratory:  Positive for shortness of breath. Negative for cough and hemoptysis.   Cardiovascular: Negative.  Negative for chest pain and leg swelling.  Gastrointestinal: Negative.  Negative for abdominal pain, blood in stool and melena.  Genitourinary: Negative.  Negative for hematuria.  Musculoskeletal: Negative.  Negative for back pain.  Skin: Negative.  Negative for rash.  Neurological:  Positive for weakness. Negative for dizziness, focal weakness and headaches.  Psychiatric/Behavioral: Negative.  The patient is not nervous/anxious.    As per HPI. Otherwise, a complete review of systems is negative.  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[place])    Morbid obesity ([place])    Sleep apnea     PAST SURGICAL HISTORY: Past Surgical History:  Procedure Laterality Date   COLONOSCOPY WITH PROPOFOL N/A 07/13/2020   Procedure: COLONOSCOPY WITH PROPOFOL;  Surgeon: Lesly Rubenstein, MD;  Location: [facility] ENDOSCOPY;  Service: Endoscopy;  Laterality: N/A;   COLONOSCOPY WITH PROPOFOL N/A 12/23/2020   Procedure: COLONOSCOPY WITH PROPOFOL;  Surgeon: Lucilla Lame, MD;  Location: [facility] ENDOSCOPY;  Service: Endoscopy;  Laterality: N/A;   ESOPHAGOGASTRODUODENOSCOPY (EGD) WITH PROPOFOL N/A 12/23/2020   Procedure: ESOPHAGOGASTRODUODENOSCOPY (EGD) WITH PROPOFOL;  Surgeon: Lucilla Lame, MD;  Location: [facility] ENDOSCOPY;  Service: Endoscopy;  Laterality: N/A;   GIVENS CAPSULE STUDY N/A 12/29/2020   Procedure: GIVENS CAPSULE STUDY;  Surgeon: Lin Landsman, MD;  Location: [facility] ENDOSCOPY;  Service: Gastroenterology;  Laterality: N/A;   TOOTH EXTRACTION      FAMILY HISTORY: Family History  Problem Relation Age of Onset   Benign prostatic hyperplasia Father    Psoriasis Father    Hypertension Mother    Hyperlipidemia Mother    Diabetes Maternal Grandmother    Diabetes Paternal Grandmother     ADVANCED DIRECTIVES (Y/N):  N  HEALTH MAINTENANCE: Social History   Tobacco Use   Smoking status: Never   Smokeless tobacco: Never  Vaping Use   Vaping Use: Never used  Substance Use Topics   Alcohol use: No   Drug use: No     Colonoscopy:  PAP:  Bone density:  Lipid panel:  Allergies  Allergen Reactions   Ampicillin Diarrhea  Bactrim [Sulfamethoxazole-Trimethoprim] Hives   Cephalexin    Claritin [Loratadine] Other (See Comments)    Irritates throat   Penicillins Rash    Current Outpatient Medications  Medication Sig Dispense Refill   acarbose (PRECOSE) 25 MG tablet Take 25 mg by mouth 3 (three) times daily with meals.     amLODipine (NORVASC) 10 MG tablet Take 10 mg by mouth  daily.     dorzolamide-timolol (COSOPT) 22.3-6.8 MG/ML ophthalmic solution Place 1 drop into the right eye 2 (two) times daily.     FARXIGA 10 MG TABS tablet Take 10 mg by mouth daily.     fenofibrate 160 MG tablet Take 160 mg by mouth daily.     ferrous sulfate 325 (65 FE) MG EC tablet Take 1 tablet (325 mg total) by mouth 2 (two) times daily. 60 tablet 3   fexofenadine (ALLEGRA) 180 MG tablet Take 180 mg by mouth daily.     fluticasone (FLONASE) 50 MCG/ACT nasal spray Place 2 sprays into both nostrils daily as needed.     glimepiride (AMARYL) 4 MG tablet Take 4 mg by mouth daily with breakfast.     HIBICLENS 4 % external liquid APPLY TOPICALLY DAILY AS NEEDED. TO WASHLEGS AND GROIN AREA 120 mL 3   Hydrocortisone-Aloe 1 % CREA Apply 1 application topically 2 (two) times daily as needed (itching). To itchy area on legs     ketoconazole (NIZORAL) 2 % cream Apply to rash in groin once a day as needed for flares. 60 g 5   lidocaine (LMX) 4 % cream Apply 1 application topically as needed.     lovastatin (MEVACOR) 10 MG tablet Take 10 mg by mouth daily.     metFORMIN (GLUCOPHAGE-XR) 500 MG 24 hr tablet Take 500-1,000 mg by mouth 2 (two) times daily. Takes 1 tablet with breakfast and 2 tablets with supper     metoprolol tartrate (LOPRESSOR) 50 MG tablet Take 50 mg by mouth 2 (two) times daily.     mupirocin ointment (BACTROBAN) 2 % Apply to boils and ulcers QD PRN flares. 22 g 3   omeprazole (PRILOSEC) 20 MG capsule Take 20 mg by mouth daily.     pioglitazone (ACTOS) 30 MG tablet Take 30 mg by mouth daily.     ramipril (ALTACE) 10 MG capsule Take 10 mg by mouth 2 (two) times daily.     spironolactone (ALDACTONE) 50 MG tablet Take 50 mg by mouth daily.     terazosin (HYTRIN) 10 MG capsule Take 10 mg by mouth daily.      torsemide (DEMADEX) 20 MG tablet Take 20 mg by mouth daily.     traZODone (DESYREL) 150 MG tablet Take 150 mg by mouth at bedtime.     TRESIBA FLEXTOUCH 200 UNIT/ML FlexTouch Pen  Inject 32 Units into the skin in the morning.     trolamine salicylate (ASPERCREME) 10 % cream Apply 1 application topically as needed for muscle pain.     vitamin B-12 (CYANOCOBALAMIN) 1000 MCG tablet Take 1,000 mcg by mouth daily.     No current facility-administered medications for this visit.    OBJECTIVE: Vitals:   03/16/21 0945  BP: (!) 136/45  Pulse: 88  Temp: 98.6 F (37 C)     Body mass index is 52.86 kg/m.    ECOG FS:1 - Symptomatic but completely ambulatory  General: Well-developed, well-nourished, no acute distress. Eyes: Pink conjunctiva, anicteric sclera. HEENT: Normocephalic, moist mucous membranes. Lungs: No audible wheezing or coughing. Heart: Regular  rate and rhythm. Abdomen: Soft, nontender, no obvious distention. Musculoskeletal: No edema, cyanosis, or clubbing. Neuro: Alert, answering all questions appropriately. Cranial nerves grossly intact. Skin: No rashes or petechiae noted. Psych: Normal affect.  LAB RESULTS:  Lab Results  Component Value Date   NA 136 01/20/2021   K 3.3 (L) 01/20/2021   CL 99 01/20/2021   CO2 26 01/20/2021   GLUCOSE 106 (H) 01/20/2021   BUN 41 (H) 01/20/2021   CREATININE 1.12 01/20/2021   CALCIUM 8.8 (L) 01/20/2021   PROT 6.8 01/20/2021   ALBUMIN 3.4 (L) 01/20/2021   AST 14 (L) 01/20/2021   ALT 13 01/20/2021   ALKPHOS 36 (L) 01/20/2021   BILITOT 1.0 01/20/2021   GFRNONAA >60 01/20/2021   GFRAA >60 10/25/2016    Lab Results  Component Value Date   WBC 4.6 03/16/2021   NEUTROABS 3.7 03/16/2021   HGB 7.8 (L) 03/16/2021   HCT 23.6 (L) 03/16/2021   MCV 106.8 (H) 03/16/2021   PLT 273 03/16/2021     STUDIES: No results found.  ASSESSMENT: IgA deficiency and MDS.   PLAN:    IgA deficiency: Patient reports he was diagnosed as a child, but has not had repeated infections throughout adulthood.  Recent and repeat IgA levels were undetectable with a normal IgM and IgG.  Patient also has anti-IgA antibodies.  Patients  with isolated IgA deficiency can have anaphylactoid reactions to blood transfusions.   MDS: Bone marrow biopsy on January 03, 2021 revealed a hypercellular marrow with erythroid hyperplasia and dyserythropoiesis.  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  No blasts, increased plasma cells, or clonality was noted.  Cytogenetics were reported as normal.  Case discussed with Dr. Janene Madeira at [facility] who plans to see patient as a second opinion.  Can consider initiating Vidaza to reduce transfusion requirement. Anemia: Patient's hemoglobin is 7.8 today, therefore he will return to clinic on Friday for 1 unit packed red blood cells.  Plan to transfuse if hemoglobin is below 8.0. 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  Premedications include Benadryl, Tylenol, and Solu-Medrol. Renal insufficiency: Resolved. Hypokalemia: Mild, monitor. Leukopenia: Resolved.   COVID: Resolved.   Disposition: Return to clinic Friday for 1 unit of blood.  Return to clinic in 1 week with repeat laboratory work and consideration of blood 2 days later and then in 2 weeks for further evaluation and consideration of blood.   Shortness of breath: Keep follow-up with pulmonary tomorrow as scheduled.   Patient expressed understanding and was in agreement with this plan. He also understands that He can call clinic at any time with any questions, concerns, or complaints.    Lloyd Huger, MD   03/16/2021 3:27 PM

## 2021-03-16 ENCOUNTER — Inpatient Hospital Stay: Payer: Medicare HMO | Admitting: Oncology

## 2021-03-16 ENCOUNTER — Telehealth: Payer: Self-pay

## 2021-03-16 ENCOUNTER — Other Ambulatory Visit: Payer: Self-pay

## 2021-03-16 ENCOUNTER — Inpatient Hospital Stay: Payer: Medicare HMO | Attending: Oncology

## 2021-03-16 ENCOUNTER — Encounter: Payer: Self-pay | Admitting: Oncology

## 2021-03-16 VITALS — BP 136/45 | HR 88 | Temp 98.6°F | Wt 337.5 lb

## 2021-03-16 DIAGNOSIS — R0602 Shortness of breath: Secondary | ICD-10-CM | POA: Insufficient documentation

## 2021-03-16 DIAGNOSIS — D802 Selective deficiency of immunoglobulin A [IgA]: Secondary | ICD-10-CM | POA: Diagnosis present

## 2021-03-16 DIAGNOSIS — E876 Hypokalemia: Secondary | ICD-10-CM | POA: Insufficient documentation

## 2021-03-16 DIAGNOSIS — D469 Myelodysplastic syndrome, unspecified: Secondary | ICD-10-CM

## 2021-03-16 LAB — CBC WITH DIFFERENTIAL/PLATELET
Abs Immature Granulocytes: 0.11 10*3/uL — ABNORMAL HIGH (ref 0.00–0.07)
Basophils Absolute: 0 10*3/uL (ref 0.0–0.1)
Basophils Relative: 0 %
Eosinophils Absolute: 0 10*3/uL (ref 0.0–0.5)
Eosinophils Relative: 0 %
HCT: 23.6 % — ABNORMAL LOW (ref 39.0–52.0)
Hemoglobin: 7.8 g/dL — ABNORMAL LOW (ref 13.0–17.0)
Immature Granulocytes: 2 %
Lymphocytes Relative: 7 %
Lymphs Abs: 0.3 10*3/uL — ABNORMAL LOW (ref 0.7–4.0)
MCH: 35.3 pg — ABNORMAL HIGH (ref 26.0–34.0)
MCHC: 33.1 g/dL (ref 30.0–36.0)
MCV: 106.8 fL — ABNORMAL HIGH (ref 80.0–100.0)
Monocytes Absolute: 0.5 10*3/uL (ref 0.1–1.0)
Monocytes Relative: 10 %
Neutro Abs: 3.7 10*3/uL (ref 1.7–7.7)
Neutrophils Relative %: 81 %
Platelets: 273 10*3/uL (ref 150–400)
RBC: 2.21 MIL/uL — ABNORMAL LOW (ref 4.22–5.81)
RDW: 18.4 % — ABNORMAL HIGH (ref 11.5–15.5)
WBC: 4.6 10*3/uL (ref 4.0–10.5)
nRBC: 0 % (ref 0.0–0.2)

## 2021-03-16 LAB — SAMPLE TO BLOOD BANK

## 2021-03-16 NOTE — Telephone Encounter (Signed)
Called to relay message to call Dr. Janene Madeira per Dr.Finn at [phone]. Lmtcb, ? ?

## 2021-03-17 ENCOUNTER — Encounter: Payer: Self-pay | Admitting: Oncology

## 2021-03-17 ENCOUNTER — Telehealth: Payer: Self-pay

## 2021-03-17 NOTE — Telephone Encounter (Signed)
Spoke with patient in the clinic and gave him the number to call the [facility] office. ?Showed pt how to send a mychart message to [place] with the updated apt date and time so we can keep up with his care. ? ?Pt understood and I helped him out of the clinic. ? ?

## 2021-03-18 ENCOUNTER — Inpatient Hospital Stay: Payer: Medicare HMO

## 2021-03-18 ENCOUNTER — Other Ambulatory Visit: Payer: Self-pay

## 2021-03-18 DIAGNOSIS — D802 Selective deficiency of immunoglobulin A [IgA]: Secondary | ICD-10-CM | POA: Diagnosis not present

## 2021-03-18 DIAGNOSIS — D469 Myelodysplastic syndrome, unspecified: Secondary | ICD-10-CM

## 2021-03-18 LAB — PREPARE RBC (CROSSMATCH)

## 2021-03-18 MED ORDER — DIPHENHYDRAMINE HCL 25 MG PO CAPS
25.0000 mg | ORAL_CAPSULE | Freq: Once | ORAL | Status: AC
  Administered 2021-03-18: 25 mg via ORAL
  Filled 2021-03-18: qty 1

## 2021-03-18 MED ORDER — METHYLPREDNISOLONE SODIUM SUCC 125 MG IJ SOLR
40.0000 mg | Freq: Once | INTRAMUSCULAR | Status: AC
  Administered 2021-03-18: 40 mg via INTRAVENOUS
  Filled 2021-03-18: qty 2

## 2021-03-18 MED ORDER — SODIUM CHLORIDE 0.9% IV SOLUTION
250.0000 mL | Freq: Once | INTRAVENOUS | Status: AC
  Administered 2021-03-18: 250 mL via INTRAVENOUS
  Filled 2021-03-18: qty 250

## 2021-03-18 MED ORDER — ACETAMINOPHEN 325 MG PO TABS
650.0000 mg | ORAL_TABLET | Freq: Once | ORAL | Status: AC
  Administered 2021-03-18: 650 mg via ORAL
  Filled 2021-03-18: qty 2

## 2021-03-19 LAB — TYPE AND SCREEN
ABO/RH(D): O POS
Antibody Screen: POSITIVE
DAT, IgG: POSITIVE
DAT, complement: POSITIVE
Unit division: 0

## 2021-03-19 LAB — BPAM RBC
Blood Product Expiration Date: 202303032316
ISSUE DATE / TIME: 202303030923
Unit Type and Rh: 5100

## 2021-03-23 ENCOUNTER — Other Ambulatory Visit: Payer: Self-pay

## 2021-03-23 ENCOUNTER — Telehealth: Payer: Self-pay | Admitting: Emergency Medicine

## 2021-03-23 ENCOUNTER — Inpatient Hospital Stay: Payer: Medicare HMO

## 2021-03-23 DIAGNOSIS — D802 Selective deficiency of immunoglobulin A [IgA]: Secondary | ICD-10-CM | POA: Diagnosis not present

## 2021-03-23 DIAGNOSIS — D469 Myelodysplastic syndrome, unspecified: Secondary | ICD-10-CM

## 2021-03-23 LAB — CBC WITH DIFFERENTIAL/PLATELET
Abs Immature Granulocytes: 0.06 10*3/uL (ref 0.00–0.07)
Basophils Absolute: 0 10*3/uL (ref 0.0–0.1)
Basophils Relative: 0 %
Eosinophils Absolute: 0 10*3/uL (ref 0.0–0.5)
Eosinophils Relative: 0 %
HCT: 25.8 % — ABNORMAL LOW (ref 39.0–52.0)
Hemoglobin: 8.5 g/dL — ABNORMAL LOW (ref 13.0–17.0)
Immature Granulocytes: 2 %
Lymphocytes Relative: 6 %
Lymphs Abs: 0.2 10*3/uL — ABNORMAL LOW (ref 0.7–4.0)
MCH: 35 pg — ABNORMAL HIGH (ref 26.0–34.0)
MCHC: 32.9 g/dL (ref 30.0–36.0)
MCV: 106.2 fL — ABNORMAL HIGH (ref 80.0–100.0)
Monocytes Absolute: 0.4 10*3/uL (ref 0.1–1.0)
Monocytes Relative: 10 %
Neutro Abs: 3.2 10*3/uL (ref 1.7–7.7)
Neutrophils Relative %: 82 %
Platelets: 234 10*3/uL (ref 150–400)
RBC: 2.43 MIL/uL — ABNORMAL LOW (ref 4.22–5.81)
RDW: 18 % — ABNORMAL HIGH (ref 11.5–15.5)
WBC: 3.8 10*3/uL — ABNORMAL LOW (ref 4.0–10.5)
nRBC: 0 % (ref 0.0–0.2)

## 2021-03-23 LAB — SAMPLE TO BLOOD BANK

## 2021-03-23 NOTE — Telephone Encounter (Signed)
Pt scheduled for blood transfusion on Friday.  Patient's hemoglobin is greater than 8.0 therefore he does not need blood this week. Called pt and left voicemail making him aware that he doesn't need blood this week. ?

## 2021-03-24 ENCOUNTER — Encounter: Payer: Self-pay | Admitting: Emergency Medicine

## 2021-03-24 ENCOUNTER — Encounter: Payer: Self-pay | Admitting: Oncology

## 2021-03-24 ENCOUNTER — Telehealth: Payer: Self-pay | Admitting: *Deleted

## 2021-03-24 NOTE — Telephone Encounter (Signed)
Message sent to pt letting him know that this will be discussed at next office visit. ?

## 2021-03-24 NOTE — Telephone Encounter (Signed)
Call from Dr Merri Ray office [facility] requesting that Dr Grayland Ormond call this patient and explain to him why he does not need to be seen at [facility] ( he does not have cancer per conversation between doctors) ?

## 2021-03-25 ENCOUNTER — Inpatient Hospital Stay: Payer: Medicare HMO

## 2021-03-25 NOTE — Progress Notes (Unsigned)
[place]  Telephone:(336) [phone] Fax:(336) [phone]  ID: Patrick Duncan OB: May 31, 1965  MR#: [id number]  XHF#:[id number]  Patient Care Team: Sofie Hartigan, MD as PCP - General (Family Medicine) Lloyd Huger, MD as Consulting Physician (Oncology)  CHIEF COMPLAINT: IgA deficiency and MDS.  INTERVAL HISTORY: Patient returns to clinic today for repeat laboratory work, further evaluation, and consideration of additional blood.  He has mild weakness and fatigue, but otherwise feels well.  He admits to persistent shortness of breath and has an appointment with pulmonology tomorrow.  He has no neurologic complaints.  He denies any recent fevers.  He has a fair appetite, but denies weight loss.  He has no chest pain, or hemoptysis.  He denies any nausea, vomiting, constipation, or diarrhea.  He has no further melena or hematochezia.  He has no urinary complaints.  [name]  REVIEW OF SYSTEMS:   Review of Systems  Constitutional:  Positive for malaise/fatigue. Negative for fever and weight loss.  Respiratory:  Positive for shortness of breath. Negative for cough and hemoptysis.   Cardiovascular: Negative.  Negative for chest pain and leg swelling.  Gastrointestinal: Negative.  Negative for abdominal pain, blood in stool and melena.  Genitourinary: Negative.  Negative for hematuria.  Musculoskeletal: Negative.  Negative for back pain.  Skin: Negative.  Negative for rash.  Neurological:  Positive for weakness. Negative for dizziness, focal weakness and headaches.  Psychiatric/Behavioral: Negative.  The patient is not nervous/anxious.    As per HPI. Otherwise, a complete review of systems is negative.  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[place])    Morbid obesity ([place])    Sleep apnea     PAST SURGICAL HISTORY: Past Surgical History:  Procedure Laterality Date   COLONOSCOPY WITH PROPOFOL N/A 07/13/2020   Procedure: COLONOSCOPY WITH PROPOFOL;  Surgeon: Lesly Rubenstein, MD;  Location: [facility] ENDOSCOPY;  Service: Endoscopy;  Laterality: N/A;   COLONOSCOPY WITH PROPOFOL N/A 12/23/2020   Procedure: COLONOSCOPY WITH PROPOFOL;  Surgeon: Lucilla Lame, MD;  Location: [facility] ENDOSCOPY;  Service: Endoscopy;  Laterality: N/A;   ESOPHAGOGASTRODUODENOSCOPY (EGD) WITH PROPOFOL N/A 12/23/2020   Procedure: ESOPHAGOGASTRODUODENOSCOPY (EGD) WITH PROPOFOL;  Surgeon: Lucilla Lame, MD;  Location: [facility] ENDOSCOPY;  Service: Endoscopy;  Laterality: N/A;   GIVENS CAPSULE STUDY N/A 12/29/2020   Procedure: GIVENS CAPSULE STUDY;  Surgeon: Lin Landsman, MD;  Location: [facility] ENDOSCOPY;  Service: Gastroenterology;  Laterality: N/A;   TOOTH EXTRACTION      FAMILY HISTORY: Family History  Problem Relation Age of Onset   Benign prostatic hyperplasia Father    Psoriasis Father    Hypertension Mother    Hyperlipidemia Mother    Diabetes Maternal Grandmother    Diabetes Paternal Grandmother     ADVANCED DIRECTIVES (Y/N):  N  HEALTH MAINTENANCE: Social History   Tobacco Use   Smoking status: Never   Smokeless tobacco: Never  Vaping Use   Vaping Use: Never used  Substance Use Topics   Alcohol use: No   Drug use: No     Colonoscopy:  PAP:  Bone density:  Lipid panel:  Allergies  Allergen Reactions   Ampicillin Diarrhea  Bactrim [Sulfamethoxazole-Trimethoprim] Hives   Cephalexin    Claritin [Loratadine] Other (See Comments)    Irritates throat   Penicillins Rash    Current Outpatient Medications  Medication Sig Dispense Refill   acarbose (PRECOSE) 25 MG tablet Take 25 mg by mouth 3 (three) times daily with meals.     amLODipine (NORVASC) 10 MG tablet Take 10 mg by mouth  daily.     dorzolamide-timolol (COSOPT) 22.3-6.8 MG/ML ophthalmic solution Place 1 drop into the right eye 2 (two) times daily.     FARXIGA 10 MG TABS tablet Take 10 mg by mouth daily.     fenofibrate 160 MG tablet Take 160 mg by mouth daily.     ferrous sulfate 325 (65 FE) MG EC tablet Take 1 tablet (325 mg total) by mouth 2 (two) times daily. 60 tablet 3   fexofenadine (ALLEGRA) 180 MG tablet Take 180 mg by mouth daily.     fluticasone (FLONASE) 50 MCG/ACT nasal spray Place 2 sprays into both nostrils daily as needed.     glimepiride (AMARYL) 4 MG tablet Take 4 mg by mouth daily with breakfast.     HIBICLENS 4 % external liquid APPLY TOPICALLY DAILY AS NEEDED. TO WASHLEGS AND GROIN AREA 120 mL 3   Hydrocortisone-Aloe 1 % CREA Apply 1 application topically 2 (two) times daily as needed (itching). To itchy area on legs     ketoconazole (NIZORAL) 2 % cream Apply to rash in groin once a day as needed for flares. 60 g 5   lidocaine (LMX) 4 % cream Apply 1 application topically as needed.     lovastatin (MEVACOR) 10 MG tablet Take 10 mg by mouth daily.     metFORMIN (GLUCOPHAGE-XR) 500 MG 24 hr tablet Take 500-1,000 mg by mouth 2 (two) times daily. Takes 1 tablet with breakfast and 2 tablets with supper     metoprolol tartrate (LOPRESSOR) 50 MG tablet Take 50 mg by mouth 2 (two) times daily.     mupirocin ointment (BACTROBAN) 2 % Apply to boils and ulcers QD PRN flares. 22 g 3   omeprazole (PRILOSEC) 20 MG capsule Take 20 mg by mouth daily.     pioglitazone (ACTOS) 30 MG tablet Take 30 mg by mouth daily.     ramipril (ALTACE) 10 MG capsule Take 10 mg by mouth 2 (two) times daily.     spironolactone (ALDACTONE) 50 MG tablet Take 50 mg by mouth daily.     terazosin (HYTRIN) 10 MG capsule Take 10 mg by mouth daily.      torsemide (DEMADEX) 20 MG tablet Take 20 mg by mouth daily.     traZODone (DESYREL) 150 MG tablet Take 150 mg by mouth at bedtime.     TRESIBA FLEXTOUCH 200 UNIT/ML FlexTouch Pen  Inject 32 Units into the skin in the morning.     trolamine salicylate (ASPERCREME) 10 % cream Apply 1 application topically as needed for muscle pain.     vitamin B-12 (CYANOCOBALAMIN) 1000 MCG tablet Take 1,000 mcg by mouth daily.     No current facility-administered medications for this visit.    OBJECTIVE: There were no vitals filed for this visit.    There is no height or weight on file to calculate BMI.    ECOG FS:1 - Symptomatic but completely ambulatory  General: Well-developed, well-nourished, no acute distress. Eyes: Pink conjunctiva, anicteric sclera. HEENT: Normocephalic, moist mucous membranes. Lungs: No audible wheezing or coughing. Heart: Regular rate and rhythm. Abdomen: Soft, nontender,  no obvious distention. Musculoskeletal: No edema, cyanosis, or clubbing. Neuro: Alert, answering all questions appropriately. Cranial nerves grossly intact. Skin: No rashes or petechiae noted. Psych: Normal affect.  LAB RESULTS:  Lab Results  Component Value Date   NA 136 01/20/2021   K 3.3 (L) 01/20/2021   CL 99 01/20/2021   CO2 26 01/20/2021   GLUCOSE 106 (H) 01/20/2021   BUN 41 (H) 01/20/2021   CREATININE 1.12 01/20/2021   CALCIUM 8.8 (L) 01/20/2021   PROT 6.8 01/20/2021   ALBUMIN 3.4 (L) 01/20/2021   AST 14 (L) 01/20/2021   ALT 13 01/20/2021   ALKPHOS 36 (L) 01/20/2021   BILITOT 1.0 01/20/2021   GFRNONAA >60 01/20/2021   GFRAA >60 10/25/2016    Lab Results  Component Value Date   WBC 3.8 (L) 03/23/2021   NEUTROABS 3.2 03/23/2021   HGB 8.5 (L) 03/23/2021   HCT 25.8 (L) 03/23/2021   MCV 106.2 (H) 03/23/2021   PLT 234 03/23/2021     STUDIES: No results found.  ASSESSMENT: IgA deficiency and MDS.   PLAN:    IgA deficiency: Patient reports he was diagnosed as a child, but has not had repeated infections throughout adulthood.  Recent and repeat IgA levels were undetectable with a normal IgM and IgG.  Patient also has anti-IgA antibodies.  Patients with  isolated IgA deficiency can have anaphylactoid reactions to blood transfusions.   MDS: Bone marrow biopsy on January 03, 2021 revealed a hypercellular marrow with erythroid hyperplasia and dyserythropoiesis.  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  No blasts, increased plasma cells, or clonality was noted.  Cytogenetics were reported as normal.  Case discussed with Dr. Janene Madeira at [facility] who plans to see patient as a second opinion.  Can consider initiating Vidaza to reduce transfusion requirement. Anemia: Patient's hemoglobin is 7.8 today, therefore he will return to clinic on Friday for 1 unit packed red blood cells.  Plan to transfuse if hemoglobin is below 8.0. 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  Premedications include Benadryl, Tylenol, and Solu-Medrol. Renal insufficiency: Resolved. Hypokalemia: Mild, monitor. Leukopenia: Resolved.   COVID: Resolved.   Disposition: Return to clinic Friday for 1 unit of blood.  Return to clinic in 1 week with repeat laboratory work and consideration of blood 2 days later and then in 2 weeks for further evaluation and consideration of blood.   Shortness of breath: Keep follow-up with pulmonary tomorrow as scheduled.   Patient expressed understanding and was in agreement with this plan. He also understands that He can call clinic at any time with any questions, concerns, or complaints.    Lloyd Huger, MD   03/25/2021 9:15 AM

## 2021-03-28 ENCOUNTER — Encounter: Payer: Self-pay | Admitting: Oncology

## 2021-03-30 ENCOUNTER — Other Ambulatory Visit: Payer: Self-pay

## 2021-03-30 ENCOUNTER — Inpatient Hospital Stay: Payer: Medicare HMO | Admitting: Oncology

## 2021-03-30 ENCOUNTER — Inpatient Hospital Stay: Payer: Medicare HMO

## 2021-03-30 DIAGNOSIS — D802 Selective deficiency of immunoglobulin A [IgA]: Secondary | ICD-10-CM

## 2021-03-30 DIAGNOSIS — D649 Anemia, unspecified: Secondary | ICD-10-CM | POA: Diagnosis not present

## 2021-03-30 DIAGNOSIS — D469 Myelodysplastic syndrome, unspecified: Secondary | ICD-10-CM

## 2021-03-30 LAB — CBC WITH DIFFERENTIAL/PLATELET
Abs Immature Granulocytes: 0.1 10*3/uL — ABNORMAL HIGH (ref 0.00–0.07)
Basophils Absolute: 0 10*3/uL (ref 0.0–0.1)
Basophils Relative: 1 %
Eosinophils Absolute: 0 10*3/uL (ref 0.0–0.5)
Eosinophils Relative: 0 %
HCT: 25.1 % — ABNORMAL LOW (ref 39.0–52.0)
Hemoglobin: 8.4 g/dL — ABNORMAL LOW (ref 13.0–17.0)
Immature Granulocytes: 3 %
Lymphocytes Relative: 6 %
Lymphs Abs: 0.2 10*3/uL — ABNORMAL LOW (ref 0.7–4.0)
MCH: 35.6 pg — ABNORMAL HIGH (ref 26.0–34.0)
MCHC: 33.5 g/dL (ref 30.0–36.0)
MCV: 106.4 fL — ABNORMAL HIGH (ref 80.0–100.0)
Monocytes Absolute: 0.4 10*3/uL (ref 0.1–1.0)
Monocytes Relative: 10 %
Neutro Abs: 3.2 10*3/uL (ref 1.7–7.7)
Neutrophils Relative %: 80 %
Platelets: 234 10*3/uL (ref 150–400)
RBC: 2.36 MIL/uL — ABNORMAL LOW (ref 4.22–5.81)
RDW: 17.7 % — ABNORMAL HIGH (ref 11.5–15.5)
WBC: 4 10*3/uL (ref 4.0–10.5)
nRBC: 0 % (ref 0.0–0.2)

## 2021-04-01 ENCOUNTER — Inpatient Hospital Stay: Payer: Medicare HMO

## 2021-04-06 ENCOUNTER — Inpatient Hospital Stay: Payer: Medicare HMO

## 2021-04-06 ENCOUNTER — Other Ambulatory Visit: Payer: Self-pay

## 2021-04-06 DIAGNOSIS — D802 Selective deficiency of immunoglobulin A [IgA]: Secondary | ICD-10-CM | POA: Diagnosis not present

## 2021-04-06 DIAGNOSIS — D469 Myelodysplastic syndrome, unspecified: Secondary | ICD-10-CM

## 2021-04-06 LAB — CBC WITH DIFFERENTIAL/PLATELET
Abs Immature Granulocytes: 0.11 10*3/uL — ABNORMAL HIGH (ref 0.00–0.07)
Basophils Absolute: 0 10*3/uL (ref 0.0–0.1)
Basophils Relative: 1 %
Eosinophils Absolute: 0 10*3/uL (ref 0.0–0.5)
Eosinophils Relative: 0 %
HCT: 19.4 % — ABNORMAL LOW (ref 39.0–52.0)
Hemoglobin: 6.5 g/dL — ABNORMAL LOW (ref 13.0–17.0)
Immature Granulocytes: 3 %
Lymphocytes Relative: 8 %
Lymphs Abs: 0.3 10*3/uL — ABNORMAL LOW (ref 0.7–4.0)
MCH: 34.8 pg — ABNORMAL HIGH (ref 26.0–34.0)
MCHC: 33.5 g/dL (ref 30.0–36.0)
MCV: 103.7 fL — ABNORMAL HIGH (ref 80.0–100.0)
Monocytes Absolute: 0.5 10*3/uL (ref 0.1–1.0)
Monocytes Relative: 12 %
Neutro Abs: 3 10*3/uL (ref 1.7–7.7)
Neutrophils Relative %: 76 %
Platelets: 206 10*3/uL (ref 150–400)
RBC: 1.87 MIL/uL — ABNORMAL LOW (ref 4.22–5.81)
RDW: 16.9 % — ABNORMAL HIGH (ref 11.5–15.5)
WBC: 3.9 10*3/uL — ABNORMAL LOW (ref 4.0–10.5)
nRBC: 0 % (ref 0.0–0.2)

## 2021-04-06 LAB — SAMPLE TO BLOOD BANK

## 2021-04-07 ENCOUNTER — Other Ambulatory Visit: Payer: Self-pay | Admitting: Oncology

## 2021-04-07 DIAGNOSIS — D649 Anemia, unspecified: Secondary | ICD-10-CM

## 2021-04-08 ENCOUNTER — Other Ambulatory Visit: Payer: Self-pay

## 2021-04-08 ENCOUNTER — Inpatient Hospital Stay: Payer: Medicare HMO

## 2021-04-08 DIAGNOSIS — D802 Selective deficiency of immunoglobulin A [IgA]: Secondary | ICD-10-CM | POA: Diagnosis not present

## 2021-04-08 DIAGNOSIS — D649 Anemia, unspecified: Secondary | ICD-10-CM

## 2021-04-08 LAB — PREPARE RBC (CROSSMATCH)

## 2021-04-08 MED ORDER — SODIUM CHLORIDE 0.9% IV SOLUTION
250.0000 mL | Freq: Once | INTRAVENOUS | Status: AC
  Administered 2021-04-08: 250 mL via INTRAVENOUS
  Filled 2021-04-08: qty 250

## 2021-04-08 MED ORDER — ACETAMINOPHEN 325 MG PO TABS
650.0000 mg | ORAL_TABLET | Freq: Once | ORAL | Status: AC
  Administered 2021-04-08: 650 mg via ORAL
  Filled 2021-04-08: qty 2

## 2021-04-08 MED ORDER — METHYLPREDNISOLONE SODIUM SUCC 125 MG IJ SOLR
40.0000 mg | Freq: Once | INTRAMUSCULAR | Status: AC
  Administered 2021-04-08: 40 mg via INTRAVENOUS
  Filled 2021-04-08: qty 2

## 2021-04-08 MED ORDER — DIPHENHYDRAMINE HCL 50 MG/ML IJ SOLN
25.0000 mg | Freq: Once | INTRAMUSCULAR | Status: AC
  Administered 2021-04-08: 25 mg via INTRAVENOUS
  Filled 2021-04-08: qty 1

## 2021-04-12 LAB — TYPE AND SCREEN
ABO/RH(D): O POS
Antibody Screen: POSITIVE
DAT, IgG: POSITIVE
DAT, complement: POSITIVE
Unit division: 0
Unit division: 0

## 2021-04-12 LAB — BPAM RBC
Blood Product Expiration Date: 202303242232
Blood Product Expiration Date: 202303242325
ISSUE DATE / TIME: 202303240920
ISSUE DATE / TIME: 202303241038
Unit Type and Rh: 5100
Unit Type and Rh: 5100

## 2021-04-12 NOTE — Progress Notes (Signed)
?[place]  ?[phone] [phone] ? ?ID: Dorathy Kinsman OB: 03-13-1965  MR#: [id number]  ERD#:[id number] ? ?Patient Care Team: ?Sofie Hartigan, MD as PCP - General (Family Medicine) ?Lloyd Huger, MD as Consulting Physician (Oncology) ? ?CHIEF COMPLAINT: IgA deficiency and symptomatic anemia.. ? ?INTERVAL HISTORY: Patient returns to clinic today for repeat laboratory work, further evaluation, and consideration of additional blood.  Patient continues to have weakness and fatigue, but otherwise feels well.  He has no neurologic complaints.  He denies any recent fevers.  He has a fair appetite, but denies weight loss.  He has no chest pain, or hemoptysis.  He denies any nausea, vomiting, constipation, or diarrhea.  He has no further melena or hematochezia.  He has no urinary complaints.  Patient offers no further specific complaints today. ? ?REVIEW OF SYSTEMS:   ?Review of Systems  ?Constitutional:  Positive for malaise/fatigue. Negative for fever and weight loss.  ?Respiratory: Negative.  Negative for cough, hemoptysis and shortness of breath.   ?Cardiovascular: Negative.  Negative for chest pain and leg swelling.  ?Gastrointestinal: Negative.  Negative for abdominal pain, blood in stool and melena.  ?Genitourinary: Negative.  Negative for hematuria.  ?Musculoskeletal: Negative.  Negative for back pain.  ?Skin: Negative.  Negative for rash.  ?Neurological:  Positive for weakness. Negative for dizziness, focal weakness and headaches.  ?Psychiatric/Behavioral: Negative.  The patient is not nervous/anxious.   ? ?As per HPI. Otherwise, a complete review of systems is negative. ? ?PAST MEDICAL HISTORY: ?Past Medical History:  ?Diagnosis Date  ? Arthritis   ? Cellulitis and abscess of left leg   ? Depression   ? Diabetes mellitus without complication ([place])   ? Hearing loss   ? History of IBS   ? HLD (hyperlipidemia)   ? Hypertension   ? IgA deficiency ([place])   ?  Morbid obesity ([place])   ? Sleep apnea   ? ? ?PAST SURGICAL HISTORY: ?Past Surgical History:  ?Procedure Laterality Date  ? COLONOSCOPY WITH PROPOFOL N/A 07/13/2020  ? Procedure: COLONOSCOPY WITH PROPOFOL;  Surgeon: Lesly Rubenstein, MD;  Location: [facility] ENDOSCOPY;  Service: Endoscopy;  Laterality: N/A;  ? COLONOSCOPY WITH PROPOFOL N/A 12/23/2020  ? Procedure: COLONOSCOPY WITH PROPOFOL;  Surgeon: Lucilla Lame, MD;  Location: [facility] ENDOSCOPY;  Service: Endoscopy;  Laterality: N/A;  ? ESOPHAGOGASTRODUODENOSCOPY (EGD) WITH PROPOFOL N/A 12/23/2020  ? Procedure: ESOPHAGOGASTRODUODENOSCOPY (EGD) WITH PROPOFOL;  Surgeon: Lucilla Lame, MD;  Location: [facility] ENDOSCOPY;  Service: Endoscopy;  Laterality: N/A;  ? GIVENS CAPSULE STUDY N/A 12/29/2020  ? Procedure: GIVENS CAPSULE STUDY;  Surgeon: Lin Landsman, MD;  Location: [facility] ENDOSCOPY;  Service: Gastroenterology;  Laterality: N/A;  ? TOOTH EXTRACTION    ? ? ?FAMILY HISTORY: ?Family History  ?Problem Relation Age of Onset  ? Benign prostatic hyperplasia Father   ? Psoriasis Father   ? Hypertension Mother   ? Hyperlipidemia Mother   ? Diabetes Maternal Grandmother   ? Diabetes Paternal Grandmother   ? ? ?ADVANCED DIRECTIVES (Y/N):  N ? ?HEALTH MAINTENANCE: ?Social History  ? ?Tobacco Use  ? Smoking status: Never  ? Smokeless tobacco: Never  ?Vaping Use  ? Vaping Use: Never used  ?Substance Use Topics  ? Alcohol use: No  ? Drug use: No  ? ? ? Colonoscopy: ? PAP: ? Bone density: ? Lipid panel: ? ?Allergies  ?Allergen Reactions  ? Ampicillin Diarrhea  ? Bactrim [Sulfamethoxazole-Trimethoprim] Hives  ? Cephalexin   ? Claritin [Loratadine] Other (  See Comments)  ?  Irritates throat  ? Penicillins Rash  ? ? ?Current Outpatient Medications  ?Medication Sig Dispense Refill  ? acarbose (PRECOSE) 25 MG tablet Take 25 mg by mouth 3 (three) times daily with meals.    ? amLODipine (NORVASC) 10 MG tablet Take 10 mg by mouth daily.    ? dorzolamide-timolol (COSOPT) 22.3-6.8 MG/ML ophthalmic  solution Place 1 drop into the right eye 2 (two) times daily.    ? FARXIGA 10 MG TABS tablet Take 10 mg by mouth daily.    ? fenofibrate 160 MG tablet Take 160 mg by mouth daily.    ? ferrous sulfate 325 (65 FE) MG EC tablet Take 1 tablet (325 mg total) by mouth 2 (two) times daily. 60 tablet 3  ? fexofenadine (ALLEGRA) 180 MG tablet Take 180 mg by mouth daily.    ? fluticasone (FLONASE) 50 MCG/ACT nasal spray Place 2 sprays into both nostrils daily as needed.    ? glimepiride (AMARYL) 4 MG tablet Take 4 mg by mouth daily with breakfast.    ? HIBICLENS 4 % external liquid APPLY TOPICALLY DAILY AS NEEDED. TO WASHLEGS AND GROIN AREA 120 mL 3  ? Hydrocortisone-Aloe 1 % CREA Apply 1 application topically 2 (two) times daily as needed (itching). To itchy area on legs    ? ketoconazole (NIZORAL) 2 % cream Apply to rash in groin once a day as needed for flares. 60 g 5  ? lidocaine (LMX) 4 % cream Apply 1 application topically as needed.    ? lovastatin (MEVACOR) 10 MG tablet Take 10 mg by mouth daily.    ? metFORMIN (GLUCOPHAGE-XR) 500 MG 24 hr tablet Take 500-1,000 mg by mouth 2 (two) times daily. Takes 1 tablet with breakfast and 2 tablets with supper    ? metoprolol tartrate (LOPRESSOR) 50 MG tablet Take 50 mg by mouth 2 (two) times daily.    ? mupirocin ointment (BACTROBAN) 2 % Apply to boils and ulcers QD PRN flares. 22 g 3  ? omeprazole (PRILOSEC) 20 MG capsule Take 20 mg by mouth daily.    ? pioglitazone (ACTOS) 30 MG tablet Take 30 mg by mouth daily.    ? ramipril (ALTACE) 10 MG capsule Take 10 mg by mouth 2 (two) times daily.    ? spironolactone (ALDACTONE) 50 MG tablet Take 50 mg by mouth daily.    ? terazosin (HYTRIN) 10 MG capsule Take 10 mg by mouth daily.     ? torsemide (DEMADEX) 20 MG tablet Take 20 mg by mouth daily.    ? traZODone (DESYREL) 150 MG tablet Take 150 mg by mouth at bedtime.    ? TRESIBA FLEXTOUCH 200 UNIT/ML FlexTouch Pen Inject 32 Units into the skin in the morning.    ? trolamine salicylate  (ASPERCREME) 10 % cream Apply 1 application topically as needed for muscle pain.    ? vitamin B-12 (CYANOCOBALAMIN) 1000 MCG tablet Take 1,000 mcg by mouth daily.    ? ?No current facility-administered medications for this visit.  ? ? ?OBJECTIVE: ?Vitals:  ? 04/13/21 0910  ?BP: (!) 148/48  ?Pulse: 75  ?Resp: 18  ?Temp: 98.3 ?F (36.8 ?C)  ?SpO2: 99%  ? ?   Body mass index is 51.53 kg/m?Marland Kitchen    ECOG FS:1 - Symptomatic but completely ambulatory ? ?General: Well-developed, well-nourished, no acute distress. ?Eyes: Pink conjunctiva, anicteric sclera. ?HEENT: Normocephalic, moist mucous membranes. ?Lungs: No audible wheezing or coughing. ?Heart: Regular rate and rhythm. ?Abdomen: Soft,  nontender, no obvious distention. ?Musculoskeletal: No edema, cyanosis, or clubbing. ?Neuro: Alert, answering all questions appropriately. Cranial nerves grossly intact. ?Skin: No rashes or petechiae noted. ?Psych: Normal affect. ? ? ?LAB RESULTS: ? ?Lab Results  ?Component Value Date  ? NA 136 01/20/2021  ? K 3.3 (L) 01/20/2021  ? CL 99 01/20/2021  ? CO2 26 01/20/2021  ? GLUCOSE 106 (H) 01/20/2021  ? BUN 41 (H) 01/20/2021  ? CREATININE 1.12 01/20/2021  ? CALCIUM 8.8 (L) 01/20/2021  ? PROT 6.8 01/20/2021  ? ALBUMIN 3.4 (L) 01/20/2021  ? AST 14 (L) 01/20/2021  ? ALT 13 01/20/2021  ? ALKPHOS 36 (L) 01/20/2021  ? BILITOT 1.0 01/20/2021  ? GFRNONAA >60 01/20/2021  ? GFRAA >60 10/25/2016  ? ? ?Lab Results  ?Component Value Date  ? WBC 4.1 04/13/2021  ? NEUTROABS 3.4 04/13/2021  ? HGB 7.8 (L) 04/13/2021  ? HCT 23.4 (L) 04/13/2021  ? MCV 102.2 (H) 04/13/2021  ? PLT 171 04/13/2021  ? ? ? ?STUDIES: ?No results found. ? ?ASSESSMENT: IgA deficiency and symptomatic anemia.  ? ?PLAN:   ? ?IgA deficiency: Patient reports he was diagnosed as a child, but has not had repeated infections throughout adulthood.  Recent and repeat IgA levels were undetectable with a normal IgM and IgG.  Patient also has anti-IgA antibodies.  Patients with isolated IgA deficiency  can have anaphylactoid reactions to blood transfusions.   ?Symptomatic anemia: Bone marrow biopsy on January 03, 2021 revealed a hypercellular marrow with erythroid hyperplasia and dyserythropoiesis.  O

## 2021-04-13 ENCOUNTER — Other Ambulatory Visit: Payer: Self-pay

## 2021-04-13 ENCOUNTER — Inpatient Hospital Stay: Payer: Medicare HMO | Admitting: Oncology

## 2021-04-13 ENCOUNTER — Encounter: Payer: Self-pay | Admitting: Oncology

## 2021-04-13 ENCOUNTER — Inpatient Hospital Stay: Payer: Medicare HMO

## 2021-04-13 VITALS — BP 148/48 | HR 75 | Temp 98.3°F | Resp 18 | Ht 67.0 in | Wt 329.0 lb

## 2021-04-13 DIAGNOSIS — D649 Anemia, unspecified: Secondary | ICD-10-CM | POA: Diagnosis not present

## 2021-04-13 DIAGNOSIS — D802 Selective deficiency of immunoglobulin A [IgA]: Secondary | ICD-10-CM | POA: Diagnosis not present

## 2021-04-13 DIAGNOSIS — D469 Myelodysplastic syndrome, unspecified: Secondary | ICD-10-CM

## 2021-04-13 LAB — CBC WITH DIFFERENTIAL/PLATELET
Abs Immature Granulocytes: 0.11 10*3/uL — ABNORMAL HIGH (ref 0.00–0.07)
Basophils Absolute: 0 10*3/uL (ref 0.0–0.1)
Basophils Relative: 0 %
Eosinophils Absolute: 0 10*3/uL (ref 0.0–0.5)
Eosinophils Relative: 0 %
HCT: 23.4 % — ABNORMAL LOW (ref 39.0–52.0)
Hemoglobin: 7.8 g/dL — ABNORMAL LOW (ref 13.0–17.0)
Immature Granulocytes: 3 %
Lymphocytes Relative: 7 %
Lymphs Abs: 0.3 10*3/uL — ABNORMAL LOW (ref 0.7–4.0)
MCH: 34.1 pg — ABNORMAL HIGH (ref 26.0–34.0)
MCHC: 33.3 g/dL (ref 30.0–36.0)
MCV: 102.2 fL — ABNORMAL HIGH (ref 80.0–100.0)
Monocytes Absolute: 0.3 10*3/uL (ref 0.1–1.0)
Monocytes Relative: 8 %
Neutro Abs: 3.4 10*3/uL (ref 1.7–7.7)
Neutrophils Relative %: 82 %
Platelets: 171 10*3/uL (ref 150–400)
RBC: 2.29 MIL/uL — ABNORMAL LOW (ref 4.22–5.81)
RDW: 18.3 % — ABNORMAL HIGH (ref 11.5–15.5)
WBC: 4.1 10*3/uL (ref 4.0–10.5)
nRBC: 0 % (ref 0.0–0.2)

## 2021-04-13 LAB — SAMPLE TO BLOOD BANK

## 2021-04-13 NOTE — Progress Notes (Signed)
Patient here for oncology follow-up appointment, concerns of fatigue and problems with blood draw ?

## 2021-04-15 ENCOUNTER — Inpatient Hospital Stay: Payer: Medicare HMO

## 2021-04-15 DIAGNOSIS — D802 Selective deficiency of immunoglobulin A [IgA]: Secondary | ICD-10-CM | POA: Diagnosis not present

## 2021-04-15 DIAGNOSIS — D649 Anemia, unspecified: Secondary | ICD-10-CM

## 2021-04-15 LAB — PREPARE RBC (CROSSMATCH)

## 2021-04-15 MED ORDER — DIPHENHYDRAMINE HCL 50 MG/ML IJ SOLN
25.0000 mg | Freq: Once | INTRAMUSCULAR | Status: AC
  Administered 2021-04-15: 25 mg via INTRAVENOUS
  Filled 2021-04-15: qty 1

## 2021-04-15 MED ORDER — METHYLPREDNISOLONE SODIUM SUCC 125 MG IJ SOLR
40.0000 mg | Freq: Once | INTRAMUSCULAR | Status: AC
  Administered 2021-04-15: 40 mg via INTRAVENOUS
  Filled 2021-04-15: qty 2

## 2021-04-15 MED ORDER — SODIUM CHLORIDE 0.9% IV SOLUTION
250.0000 mL | Freq: Once | INTRAVENOUS | Status: AC
  Administered 2021-04-15: 250 mL via INTRAVENOUS
  Filled 2021-04-15: qty 250

## 2021-04-15 MED ORDER — ACETAMINOPHEN 325 MG PO TABS
650.0000 mg | ORAL_TABLET | Freq: Once | ORAL | Status: AC
  Administered 2021-04-15: 650 mg via ORAL
  Filled 2021-04-15: qty 2

## 2021-04-18 ENCOUNTER — Encounter: Payer: Self-pay | Admitting: Oncology

## 2021-04-18 LAB — TYPE AND SCREEN
ABO/RH(D): O POS
Antibody Screen: POSITIVE
DAT, IgG: POSITIVE
DAT, complement: POSITIVE
Unit division: 0

## 2021-04-18 LAB — BPAM RBC
Blood Product Expiration Date: 202303312220
ISSUE DATE / TIME: 202303310936
Unit Type and Rh: 5100

## 2021-04-19 ENCOUNTER — Encounter: Payer: Self-pay | Admitting: Oncology

## 2021-04-19 ENCOUNTER — Encounter: Payer: Self-pay | Admitting: Emergency Medicine

## 2021-04-20 ENCOUNTER — Inpatient Hospital Stay: Payer: Medicare HMO

## 2021-04-22 ENCOUNTER — Inpatient Hospital Stay: Payer: Medicare HMO

## 2021-05-04 ENCOUNTER — Inpatient Hospital Stay: Payer: Medicare HMO | Admitting: Oncology

## 2021-05-04 ENCOUNTER — Encounter: Payer: Self-pay | Admitting: Oncology

## 2021-05-04 ENCOUNTER — Inpatient Hospital Stay: Payer: Medicare HMO | Attending: Oncology

## 2021-05-04 ENCOUNTER — Encounter: Payer: Self-pay | Admitting: Emergency Medicine

## 2021-05-04 DIAGNOSIS — D759 Disease of blood and blood-forming organs, unspecified: Secondary | ICD-10-CM

## 2021-05-04 DIAGNOSIS — D7589 Other specified diseases of blood and blood-forming organs: Secondary | ICD-10-CM | POA: Diagnosis not present

## 2021-05-04 DIAGNOSIS — D802 Selective deficiency of immunoglobulin A [IgA]: Secondary | ICD-10-CM | POA: Insufficient documentation

## 2021-05-04 DIAGNOSIS — D469 Myelodysplastic syndrome, unspecified: Secondary | ICD-10-CM

## 2021-05-04 LAB — CBC WITH DIFFERENTIAL/PLATELET
Abs Immature Granulocytes: 0.18 10*3/uL — ABNORMAL HIGH (ref 0.00–0.07)
Basophils Absolute: 0 10*3/uL (ref 0.0–0.1)
Basophils Relative: 1 %
Eosinophils Absolute: 0 10*3/uL (ref 0.0–0.5)
Eosinophils Relative: 0 %
HCT: 22.9 % — ABNORMAL LOW (ref 39.0–52.0)
Hemoglobin: 7.7 g/dL — ABNORMAL LOW (ref 13.0–17.0)
Immature Granulocytes: 5 %
Lymphocytes Relative: 6 %
Lymphs Abs: 0.3 10*3/uL — ABNORMAL LOW (ref 0.7–4.0)
MCH: 34.8 pg — ABNORMAL HIGH (ref 26.0–34.0)
MCHC: 33.6 g/dL (ref 30.0–36.0)
MCV: 103.6 fL — ABNORMAL HIGH (ref 80.0–100.0)
Monocytes Absolute: 0.4 10*3/uL (ref 0.1–1.0)
Monocytes Relative: 9 %
Neutro Abs: 3.2 10*3/uL (ref 1.7–7.7)
Neutrophils Relative %: 79 %
Platelets: 131 10*3/uL — ABNORMAL LOW (ref 150–400)
RBC: 2.21 MIL/uL — ABNORMAL LOW (ref 4.22–5.81)
RDW: 17.4 % — ABNORMAL HIGH (ref 11.5–15.5)
WBC: 4 10*3/uL (ref 4.0–10.5)
nRBC: 0 % (ref 0.0–0.2)

## 2021-05-04 LAB — SAMPLE TO BLOOD BANK

## 2021-05-04 NOTE — Progress Notes (Signed)
?[facility]  ?Telephone:([phone]) [phone] [phone] ? ?ID: Patrick Duncan OB: 1965-12-25  MR#: [id number]  GXQ#:[id number] ? ?Patient Care Team: ?Sofie Hartigan, MD as PCP - General (Family Medicine) ?Lloyd Huger, MD as Consulting Physician (Oncology) ? 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   ? ?INTERVAL HISTORY: Patient returns to clinic today for further evaluation and initiation of treatment with Aranesp.  He recently had second opinion at [facility] who did not feel he had MDS, but rather idiopathic cytopenia of undetermined significance.  Recommendation was to minimize blood transfusion using Aranesp 500 mcg every 2 weeks.  Patient continues to have chronic weakness and fatigue, but otherwise feels well.  He has no neurologic complaints.  He denies any recent fevers.  He has a fair appetite, but denies weight loss.  He has no chest pain, or hemoptysis.  He denies any nausea, vomiting, constipation, or diarrhea.  He has no further melena or hematochezia.  He has no urinary complaints.  Patient offers no further specific complaints today. ? ?REVIEW OF SYSTEMS:   ?Review of Systems  ?Constitutional:  Positive for malaise/fatigue. Negative for fever and weight loss.  ?Respiratory: Negative.  Negative for cough, hemoptysis and shortness of breath.   ?Cardiovascular: Negative.  Negative for chest pain and leg swelling.  ?Gastrointestinal: Negative.  Negative for abdominal pain, blood in stool and melena.  ?Genitourinary: Negative.  Negative for hematuria.  ?Musculoskeletal: Negative.  Negative for back pain.  ?Skin: Negative.  Negative for rash.  ?Neurological:  Positive for weakness. Negative for dizziness, focal weakness and headaches.  ?Psychiatric/Behavioral: Negative.  The patient is not nervous/anxious.   ? ?As per HPI. Otherwise, a complete review of systems is negative. ? ?PAST MEDICAL HISTORY: ?Past Medical History:  ?Diagnosis Date  ?  Arthritis   ? Cellulitis and abscess of left leg   ? Depression   ? Diabetes mellitus without complication ([place])   ? Hearing loss   ? History of IBS   ? HLD (hyperlipidemia)   ? Hypertension   ? IgA deficiency ([place])   ? Morbid obesity ([place])   ? Sleep apnea   ? ? ?PAST SURGICAL HISTORY: ?Past Surgical History:  ?Procedure Laterality Date  ? COLONOSCOPY WITH PROPOFOL N/A 07/13/2020  ? Procedure: COLONOSCOPY WITH PROPOFOL;  Surgeon: Lesly Rubenstein, MD;  Location: [facility] ENDOSCOPY;  Service: Endoscopy;  Laterality: N/A;  ? COLONOSCOPY WITH PROPOFOL N/A 12/23/2020  ? Procedure: COLONOSCOPY WITH PROPOFOL;  Surgeon: Lucilla Lame, MD;  Location: [facility] ENDOSCOPY;  Service: Endoscopy;  Laterality: N/A;  ? ESOPHAGOGASTRODUODENOSCOPY (EGD) WITH PROPOFOL N/A 12/23/2020  ? Procedure: ESOPHAGOGASTRODUODENOSCOPY (EGD) WITH PROPOFOL;  Surgeon: Lucilla Lame, MD;  Location: [facility] ENDOSCOPY;  Service: Endoscopy;  Laterality: N/A;  ? GIVENS CAPSULE STUDY N/A 12/29/2020  ? Procedure: GIVENS CAPSULE STUDY;  Surgeon: Lin Landsman, MD;  Location: [facility] ENDOSCOPY;  Service: Gastroenterology;  Laterality: N/A;  ? TOOTH EXTRACTION    ? ? ?FAMILY HISTORY: ?Family History  ?Problem Relation Age of Onset  ? Benign prostatic hyperplasia Father   ? Psoriasis Father   ? Hypertension Mother   ? Hyperlipidemia Mother   ? Diabetes Maternal Grandmother   ? Diabetes Paternal Grandmother   ? ? ?ADVANCED DIRECTIVES (Y/N):  N ? ?HEALTH MAINTENANCE: ?Social History  ? ?Tobacco Use  ? Smoking status: Never  ? Smokeless tobacco: Never  ?Vaping Use  ? Vaping Use: Never used  ?Substance Use Topics  ? Alcohol use: No  ? Drug use:  No  ? ? ? Colonoscopy: ? PAP: ? Bone density: ? Lipid panel: ? ?Allergies  ?Allergen Reactions  ? Ampicillin Diarrhea  ? Bactrim [Sulfamethoxazole-Trimethoprim] Hives  ? Cephalexin   ? Claritin [Loratadine] Other (See Comments)  ?  Irritates throat  ? Penicillins Rash  ? ? ?Current Outpatient Medications  ?Medication Sig Dispense  Refill  ? acarbose (PRECOSE) 25 MG tablet Take 25 mg by mouth 3 (three) times daily with meals.    ? amLODipine (NORVASC) 10 MG tablet Take 10 mg by mouth daily.    ? fenofibrate 160 MG tablet Take 160 mg by mouth daily.    ? fexofenadine (ALLEGRA) 180 MG tablet Take 180 mg by mouth daily.    ? fluticasone (FLONASE) 50 MCG/ACT nasal spray Place 2 sprays into both nostrils daily as needed.    ? glimepiride (AMARYL) 4 MG tablet Take 4 mg by mouth daily with breakfast.    ? HIBICLENS 4 % external liquid APPLY TOPICALLY DAILY AS NEEDED. TO WASHLEGS AND GROIN AREA 120 mL 3  ? Hydrocortisone-Aloe 1 % CREA Apply 1 application topically 2 (two) times daily as needed (itching). To itchy area on legs    ? ketoconazole (NIZORAL) 2 % cream Apply to rash in groin once a day as needed for flares. 60 g 5  ? latanoprost (XALATAN) 0.005 % ophthalmic solution Apply to eye.    ? lidocaine (LMX) 4 % cream Apply 1 application topically as needed.    ? lovastatin (MEVACOR) 10 MG tablet Take 10 mg by mouth daily.    ? metFORMIN (GLUCOPHAGE-XR) 500 MG 24 hr tablet Take 500-1,000 mg by mouth 2 (two) times daily. Takes 1 tablet with breakfast and 2 tablets with supper    ? metoprolol tartrate (LOPRESSOR) 50 MG tablet Take 50 mg by mouth 2 (two) times daily.    ? Multiple Vitamins-Minerals (MULTIVITAMIN WITH MINERALS) tablet Take 1 tablet by mouth daily.    ? mupirocin ointment (BACTROBAN) 2 % Apply to boils and ulcers QD PRN flares. 22 g 3  ? omeprazole (PRILOSEC) 20 MG capsule Take 20 mg by mouth daily.    ? pioglitazone (ACTOS) 30 MG tablet Take 30 mg by mouth daily.    ? ramipril (ALTACE) 10 MG capsule Take 10 mg by mouth 2 (two) times daily.    ? spironolactone (ALDACTONE) 50 MG tablet Take 50 mg by mouth daily.    ? terazosin (HYTRIN) 10 MG capsule Take 10 mg by mouth daily.     ? torsemide (DEMADEX) 20 MG tablet Take 20 mg by mouth daily.    ? traZODone (DESYREL) 150 MG tablet Take 150 mg by mouth at bedtime.    ? TRESIBA FLEXTOUCH 200  UNIT/ML FlexTouch Pen Inject 32 Units into the skin in the morning.    ? trolamine salicylate (ASPERCREME) 10 % cream Apply 1 application topically as needed for muscle pain.    ? vitamin B-12 (CYANOCOBALAMIN) 1000 MCG tablet Take 1,000 mcg by mouth daily.    ? FARXIGA 10 MG TABS tablet Take 10 mg by mouth daily. (Patient not taking: Reported on 05/04/2021)    ? ferrous sulfate 325 (65 FE) MG EC tablet Take 1 tablet (325 mg total) by mouth 2 (two) times daily. (Patient not taking: Reported on 05/04/2021) 60 tablet 3  ? ?No current facility-administered medications for this visit.  ? ? ?OBJECTIVE: ?Vitals:  ? 05/04/21 0844  ?BP: (!) 136/54  ?Pulse: 79  ?Resp: 18  ?Temp: 97.9 ?  F (36.6 ?C)  ?SpO2: 100%  ? ?   Body mass index is 52.47 kg/m?Marland Kitchen    ECOG FS:1 - Symptomatic but completely ambulatory ? ?General: Well-developed, well-nourished, no acute distress. ?Eyes: Pink conjunctiva, anicteric sclera. ?HEENT: Normocephalic, moist mucous membranes. ?Lungs: No audible wheezing or coughing. ?Heart: Regular rate and rhythm. ?Abdomen: Soft, nontender, no obvious distention. ?Musculoskeletal: No edema, cyanosis, or clubbing. ?Neuro: Alert, answering all questions appropriately. Cranial nerves grossly intact. ?Skin: No rashes or petechiae noted. ?Psych: Normal affect. ? ? ?LAB RESULTS: ? ?Lab Results  ?Component Value Date  ? NA 136 01/20/2021  ? K 3.3 (L) 01/20/2021  ? CL 99 01/20/2021  ? CO2 26 01/20/2021  ? GLUCOSE 106 (H) 01/20/2021  ? BUN 41 (H) 01/20/2021  ? CREATININE 1.12 01/20/2021  ? CALCIUM 8.8 (L) 01/20/2021  ? PROT 6.8 01/20/2021  ? ALBUMIN 3.4 (L) 01/20/2021  ? AST 14 (L) 01/20/2021  ? ALT 13 01/20/2021  ? ALKPHOS 36 (L) 01/20/2021  ? BILITOT 1.0 01/20/2021  ? GFRNONAA >60 01/20/2021  ? GFRAA >60 10/25/2016  ? ? ?Lab Results  ?Component Value Date  ? WBC 4.0 05/04/2021  ? NEUTROABS 3.2 05/04/2021  ? HGB 7.7 (L) 05/04/2021  ? HCT 22.9 (L) 05/04/2021  ? MCV 103.6 (H) 05/04/2021  ? PLT 131 (L) 05/04/2021   ? ? ? ?STUDIES: ?No results found. ? ?ASSESSMENT:Idiopathic cytopenia of undetermined significance. ? ?PLAN:   ? ?Idiopathic cytopenia of undetermined significance: Bone marrow biopsy on January 03, 2021 revealed a hypercell

## 2021-05-06 ENCOUNTER — Inpatient Hospital Stay: Payer: Medicare HMO

## 2021-05-06 VITALS — BP 129/52 | HR 69

## 2021-05-06 DIAGNOSIS — D759 Disease of blood and blood-forming organs, unspecified: Secondary | ICD-10-CM

## 2021-05-06 DIAGNOSIS — D802 Selective deficiency of immunoglobulin A [IgA]: Secondary | ICD-10-CM | POA: Diagnosis not present

## 2021-05-06 MED ORDER — DARBEPOETIN ALFA 500 MCG/ML IJ SOSY
500.0000 ug | PREFILLED_SYRINGE | Freq: Once | INTRAMUSCULAR | Status: AC
  Administered 2021-05-06: 500 ug via SUBCUTANEOUS
  Filled 2021-05-06: qty 1

## 2021-05-12 ENCOUNTER — Other Ambulatory Visit: Payer: Self-pay | Admitting: Student

## 2021-05-12 DIAGNOSIS — I3139 Other pericardial effusion (noninflammatory): Secondary | ICD-10-CM

## 2021-05-18 ENCOUNTER — Ambulatory Visit: Payer: Medicare HMO

## 2021-05-18 ENCOUNTER — Inpatient Hospital Stay: Payer: Medicare HMO | Attending: Oncology

## 2021-05-18 ENCOUNTER — Inpatient Hospital Stay: Payer: Medicare HMO

## 2021-05-18 VITALS — BP 144/52 | HR 69

## 2021-05-18 DIAGNOSIS — D759 Disease of blood and blood-forming organs, unspecified: Secondary | ICD-10-CM | POA: Diagnosis not present

## 2021-05-18 DIAGNOSIS — R5383 Other fatigue: Secondary | ICD-10-CM | POA: Diagnosis not present

## 2021-05-18 DIAGNOSIS — D802 Selective deficiency of immunoglobulin A [IgA]: Secondary | ICD-10-CM | POA: Diagnosis not present

## 2021-05-18 DIAGNOSIS — D649 Anemia, unspecified: Secondary | ICD-10-CM

## 2021-05-18 DIAGNOSIS — R531 Weakness: Secondary | ICD-10-CM | POA: Insufficient documentation

## 2021-05-18 DIAGNOSIS — D469 Myelodysplastic syndrome, unspecified: Secondary | ICD-10-CM

## 2021-05-18 LAB — CBC WITH DIFFERENTIAL/PLATELET
Abs Immature Granulocytes: 0.11 10*3/uL — ABNORMAL HIGH (ref 0.00–0.07)
Basophils Absolute: 0 10*3/uL (ref 0.0–0.1)
Basophils Relative: 0 %
Eosinophils Absolute: 0 10*3/uL (ref 0.0–0.5)
Eosinophils Relative: 0 %
HCT: 24.4 % — ABNORMAL LOW (ref 39.0–52.0)
Hemoglobin: 8.3 g/dL — ABNORMAL LOW (ref 13.0–17.0)
Immature Granulocytes: 2 %
Lymphocytes Relative: 6 %
Lymphs Abs: 0.3 10*3/uL — ABNORMAL LOW (ref 0.7–4.0)
MCH: 34.9 pg — ABNORMAL HIGH (ref 26.0–34.0)
MCHC: 34 g/dL (ref 30.0–36.0)
MCV: 102.5 fL — ABNORMAL HIGH (ref 80.0–100.0)
Monocytes Absolute: 0.4 10*3/uL (ref 0.1–1.0)
Monocytes Relative: 8 %
Neutro Abs: 4.1 10*3/uL (ref 1.7–7.7)
Neutrophils Relative %: 84 %
Platelets: 95 10*3/uL — ABNORMAL LOW (ref 150–400)
RBC: 2.38 MIL/uL — ABNORMAL LOW (ref 4.22–5.81)
RDW: 18.8 % — ABNORMAL HIGH (ref 11.5–15.5)
WBC: 4.9 10*3/uL (ref 4.0–10.5)
nRBC: 0 % (ref 0.0–0.2)

## 2021-05-18 MED ORDER — DARBEPOETIN ALFA 500 MCG/ML IJ SOSY
500.0000 ug | PREFILLED_SYRINGE | Freq: Once | INTRAMUSCULAR | Status: AC
  Administered 2021-05-18: 500 ug via SUBCUTANEOUS
  Filled 2021-05-18: qty 1

## 2021-05-18 MED ORDER — DARBEPOETIN ALFA 500 MCG/ML IJ SOSY: 500.0000 ug | PREFILLED_SYRINGE | Freq: Once | INTRAMUSCULAR | Status: DC

## 2021-05-19 LAB — SAMPLE TO BLOOD BANK

## 2021-05-25 ENCOUNTER — Ambulatory Visit
Admission: RE | Admit: 2021-05-25 | Discharge: 2021-05-25 | Disposition: A | Discharge status: Home / Self Care | Payer: Medicare HMO | Source: Ambulatory Visit | Attending: Student | Admitting: Student

## 2021-05-25 DIAGNOSIS — G4730 Sleep apnea, unspecified: Secondary | ICD-10-CM | POA: Diagnosis not present

## 2021-05-25 DIAGNOSIS — I10 Essential (primary) hypertension: Secondary | ICD-10-CM | POA: Insufficient documentation

## 2021-05-25 DIAGNOSIS — I3139 Other pericardial effusion (noninflammatory): Secondary | ICD-10-CM | POA: Diagnosis present

## 2021-05-25 DIAGNOSIS — E119 Type 2 diabetes mellitus without complications: Secondary | ICD-10-CM | POA: Insufficient documentation

## 2021-05-25 NOTE — Progress Notes (Signed)
*  PRELIMINARY RESULTS* ?Echocardiogram ?2D Echocardiogram has been performed. ? ?Zoeie Ritter, [place] ?05/25/2021, 9:22 AM ?

## 2021-05-25 NOTE — Progress Notes (Signed)
*  PRELIMINARY RESULTS* ?Echocardiogram ?2D Echocardiogram has been performed. ? ?Ayaan Ringle, [place] ?05/25/2021, 9:21 AM ?

## 2021-05-26 LAB — ECHOCARDIOGRAM COMPLETE: S' Lateral: 3.7 cm

## 2021-05-29 NOTE — Progress Notes (Signed)
[place]  [phone] [phone]  ID: Dorathy Kinsman OB: [birth year]  MR#: [id number]  MDY#:[id number]  Patient Care Team: Sofie Hartigan, MD as PCP - General (Family Medicine) Lloyd Huger, MD as Consulting Physician (Oncology)  CHIEF COMPLAINT: Idiopathic cytopenia of undetermined significance.    INTERVAL HISTORY: Patient returns to clinic today for further evaluation and continuation of Aranesp.  He noticed increased weakness and fatigue recently, but otherwise feels well. He has no neurologic complaints.  He denies any recent fevers.  He has a fair appetite, but denies weight loss.  He has no chest pain, or hemoptysis.  He denies any nausea, vomiting, constipation, or diarrhea.  He has no further melena or hematochezia.  He has no urinary complaints.  Patient offers no further specific complaints today.  REVIEW OF SYSTEMS:   Review of Systems  Constitutional:  Positive for malaise/fatigue. Negative for fever and weight loss.  Respiratory: Negative.  Negative for cough, hemoptysis and shortness of breath.   Cardiovascular: Negative.  Negative for chest pain and leg swelling.  Gastrointestinal: Negative.  Negative for abdominal pain, blood in stool and melena.  Genitourinary: Negative.  Negative for hematuria.  Musculoskeletal: Negative.  Negative for back pain.  Skin: Negative.  Negative for rash.  Neurological:  Positive for weakness. Negative for dizziness, focal weakness and headaches.  Psychiatric/Behavioral: Negative.  The patient is not nervous/anxious.    As per HPI. Otherwise, a complete review of systems is negative.  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[place])    Morbid obesity ([place])     Sleep apnea     PAST SURGICAL HISTORY: Past Surgical History:  Procedure Laterality Date   COLONOSCOPY WITH PROPOFOL N/A 07/13/2020   Procedure: COLONOSCOPY WITH PROPOFOL;  Surgeon: Lesly Rubenstein, MD;  Location: [facility] ENDOSCOPY;  Service: Endoscopy;  Laterality: N/A;   COLONOSCOPY WITH PROPOFOL N/A 12/23/2020   Procedure: COLONOSCOPY WITH PROPOFOL;  Surgeon: Lucilla Lame, MD;  Location: [facility] ENDOSCOPY;  Service: Endoscopy;  Laterality: N/A;   ESOPHAGOGASTRODUODENOSCOPY (EGD) WITH PROPOFOL N/A 12/23/2020   Procedure: ESOPHAGOGASTRODUODENOSCOPY (EGD) WITH PROPOFOL;  Surgeon: Lucilla Lame, MD;  Location: [facility] ENDOSCOPY;  Service: Endoscopy;  Laterality: N/A;   GIVENS CAPSULE STUDY N/A 12/29/2020   Procedure: GIVENS CAPSULE STUDY;  Surgeon: Lin Landsman, MD;  Location: [facility] ENDOSCOPY;  Service: Gastroenterology;  Laterality: N/A;   TOOTH EXTRACTION      FAMILY HISTORY: Family History  Problem Relation Age of Onset   Benign prostatic hyperplasia Father    Psoriasis Father    Hypertension Mother    Hyperlipidemia Mother    Diabetes Maternal Grandmother    Diabetes Paternal Grandmother     ADVANCED DIRECTIVES (Y/N):  N  HEALTH MAINTENANCE: Social History   Tobacco Use   Smoking status: Never   Smokeless tobacco: Never  Vaping Use   Vaping Use: Never used  Substance Use Topics   Alcohol use: No   Drug use: No     Colonoscopy:  PAP:  Bone density:  Lipid panel:  Allergies  Allergen Reactions   Ampicillin Diarrhea   Bactrim [Sulfamethoxazole-Trimethoprim] Hives   Cephalexin    Claritin [Loratadine] Other (See Comments)  Irritates throat   Penicillins Rash    Current Outpatient Medications  Medication Sig Dispense Refill   acarbose (PRECOSE) 25 MG tablet Take 25 mg by mouth 3 (three) times daily with meals.     amLODipine (NORVASC) 10 MG tablet Take 10 mg by mouth daily.     fenofibrate 160 MG tablet Take 160 mg by mouth daily.     fexofenadine  (ALLEGRA) 180 MG tablet Take 180 mg by mouth daily.     fluticasone (FLONASE) 50 MCG/ACT nasal spray Place 2 sprays into both nostrils daily as needed.     glimepiride (AMARYL) 4 MG tablet Take 4 mg by mouth daily with breakfast.     HIBICLENS 4 % external liquid APPLY TOPICALLY DAILY AS NEEDED. TO WASHLEGS AND GROIN AREA 120 mL 3   Hydrocortisone-Aloe 1 % CREA Apply 1 application topically 2 (two) times daily as needed (itching). To itchy area on legs     ketoconazole (NIZORAL) 2 % cream Apply to rash in groin once a day as needed for flares. 60 g 5   latanoprost (XALATAN) 0.005 % ophthalmic solution Apply to eye.     lidocaine (LMX) 4 % cream Apply 1 application topically as needed.     lovastatin (MEVACOR) 10 MG tablet Take 10 mg by mouth daily.     metFORMIN (GLUCOPHAGE-XR) 500 MG 24 hr tablet Take 500-1,000 mg by mouth 2 (two) times daily. Takes 1 tablet with breakfast and 2 tablets with supper     metoprolol tartrate (LOPRESSOR) 50 MG tablet Take 50 mg by mouth 2 (two) times daily.     Multiple Vitamins-Minerals (MULTIVITAMIN WITH MINERALS) tablet Take 1 tablet by mouth daily.     mupirocin ointment (BACTROBAN) 2 % Apply to boils and ulcers QD PRN flares. 22 g 3   omeprazole (PRILOSEC) 20 MG capsule Take 20 mg by mouth daily.     pioglitazone (ACTOS) 30 MG tablet Take 30 mg by mouth daily.     ramipril (ALTACE) 10 MG capsule Take 10 mg by mouth 2 (two) times daily.     spironolactone (ALDACTONE) 50 MG tablet Take 50 mg by mouth daily.     terazosin (HYTRIN) 10 MG capsule Take 10 mg by mouth daily.      torsemide (DEMADEX) 20 MG tablet Take 20 mg by mouth daily.     traZODone (DESYREL) 150 MG tablet Take 150 mg by mouth at bedtime.     TRESIBA FLEXTOUCH 200 UNIT/ML FlexTouch Pen Inject 32 Units into the skin in the morning.     trolamine salicylate (ASPERCREME) 10 % cream Apply 1 application topically as needed for muscle pain.     vitamin B-12 (CYANOCOBALAMIN) 1000 MCG tablet Take 1,000  mcg by mouth daily.     FARXIGA 10 MG TABS tablet Take 10 mg by mouth daily. (Patient not taking: Reported on 05/04/2021)     ferrous sulfate 325 (65 FE) MG EC tablet Take 1 tablet (325 mg total) by mouth 2 (two) times daily. (Patient not taking: Reported on 05/04/2021) 60 tablet 3   No current facility-administered medications for this visit.    OBJECTIVE: Vitals:   06/01/21 0956  BP: (!) 123/55  Pulse: 69  Resp: 18  Temp: (!) 97 F (36.1 C)  SpO2: 100%      Body mass index is 51.4 kg/m.    ECOG FS:1 - Symptomatic but completely ambulatory  General: Well-developed, well-nourished, no acute distress. Eyes: Pink conjunctiva, anicteric sclera. HEENT: Normocephalic,  moist mucous membranes. Lungs: No audible wheezing or coughing. Heart: Regular rate and rhythm. Abdomen: Soft, nontender, no obvious distention. Musculoskeletal: No edema, cyanosis, or clubbing. Neuro: Alert, answering all questions appropriately. Cranial nerves grossly intact. Skin: No rashes or petechiae noted. Psych: Normal affect.  LAB RESULTS:  Lab Results  Component Value Date   NA 136 01/20/2021   K 3.3 (L) 01/20/2021   CL 99 01/20/2021   CO2 26 01/20/2021   GLUCOSE 106 (H) 01/20/2021   BUN 41 (H) 01/20/2021   CREATININE 1.12 01/20/2021   CALCIUM 8.8 (L) 01/20/2021   PROT 6.8 01/20/2021   ALBUMIN 3.4 (L) 01/20/2021   AST 14 (L) 01/20/2021   ALT 13 01/20/2021   ALKPHOS 36 (L) 01/20/2021   BILITOT 1.0 01/20/2021   GFRNONAA >60 01/20/2021   GFRAA >60 10/25/2016    Lab Results  Component Value Date   WBC 4.6 06/01/2021   NEUTROABS 3.6 06/01/2021   HGB 6.4 (L) 06/01/2021   HCT 19.2 (L) 06/01/2021   MCV 103.8 (H) 06/01/2021   PLT 235 06/01/2021     STUDIES: ECHOCARDIOGRAM COMPLETE  Result Date: 05/26/2021    ECHOCARDIOGRAM REPORT   Patient Name:   KASHTEN GOWIN Date of Exam: 05/25/2021 Medical Rec #:  [id number]         Height:       67.0 in Accession #:    [id number]        Weight:        335.0 lb Date of Birth:  May 04, 1965        BSA:          2.518 m Patient Age:    5 years          BP:           144/52 mmHg Patient Gender: M                 HR:           69 bpm. Exam Location:  [facility] Procedure: 2D Echo, Cardiac Doppler and Color Doppler Indications:     I31.39 Pericardial effusion without cardiac tamponade.  History:         Patient has prior history of Echocardiogram examinations, most                  recent 12/23/2020. Risk Factors:Hypertension, Diabetes and Sleep                  Apnea.  Sonographer:     Sherrie Sport Referring Phys:  [id number] [place] WHITE Diagnosing Phys: Serafina Royals MD  Sonographer Comments: No apical window, suboptimal subcostal window and suboptimal parasternal window. IMPRESSIONS  1. Left ventricular ejection fraction, by estimation, is 60 to 65%. The left ventricle has normal function. The left ventricle has no regional wall motion abnormalities. Left ventricular diastolic parameters were normal.  2. Right ventricular systolic function is normal. The right ventricular size is normal.  3. A small pericardial effusion is present.  4. The mitral valve is normal in structure. Trivial mitral valve regurgitation.  5. The aortic valve is normal in structure. Aortic valve regurgitation is not visualized. FINDINGS  Left Ventricle: Left ventricular ejection fraction, by estimation, is 60 to 65%. The left ventricle has normal function. The left ventricle has no regional wall motion abnormalities. The left ventricular internal cavity size was normal in size. There is  no left ventricular hypertrophy. Left ventricular diastolic parameters were normal. Right Ventricle: The right ventricular size  is normal. No increase in right ventricular wall thickness. Right ventricular systolic function is normal. Left Atrium: Left atrial size was normal in size. Right Atrium: Right atrial size was normal in size. Pericardium: A small pericardial effusion is present. Mitral Valve: The mitral valve  is normal in structure. Trivial mitral valve regurgitation. Tricuspid Valve: The tricuspid valve is normal in structure. Tricuspid valve regurgitation is trivial. Aortic Valve: The aortic valve is normal in structure. Aortic valve regurgitation is not visualized. Pulmonic Valve: The pulmonic valve was normal in structure. Pulmonic valve regurgitation is trivial. Aorta: The aortic root and ascending aorta are structurally normal, with no evidence of dilitation. IAS/Shunts: No atrial level shunt detected by color flow Doppler.  LEFT VENTRICLE PLAX 2D LVIDd:         5.30 cm LVIDs:         3.70 cm LV PW:         1.50 cm LV IVS:        1.40 cm LVOT diam:     2.00 cm LVOT Area:     3.14 cm  LEFT ATRIUM         Index LA diam:    5.50 cm 2.18 cm/m                        PULMONIC VALVE AORTA                 RVOT Peak grad: 8 mmHg Ao Root diam: 3.10 cm   SHUNTS Systemic Diam: 2.00 cm Pulmonic VTI:  0.331 m Serafina Royals MD Electronically signed by Serafina Royals MD Signature Date/Time: 05/26/2021/12:21:04 PM    Final     ASSESSMENT:Idiopathic cytopenia of undetermined significance.  PLAN:    Idiopathic cytopenia of undetermined significance: Bone marrow biopsy on January 03, 2021 revealed a hypercellular marrow with erythroid hyperplasia and dyserythropoiesis.  Patient initially thought to have MDS, but second opinion from [facility] did not feel his bone marrow supported this diagnosis.  No blasts, increased plasma cells, or clonality was noted.  Cytogenetics were reported as normal.  Recommendation is to treat with Aranesp 500 mcg every 2 weeks.  If patient does not have an adequate response in 3 months, consider repeat bone marrow biopsy.  Patient had an initial response to Aranesp, but his hemoglobin has now trended back down to 6.4.  Despite this, we will proceed with Aranesp today and patient will return to clinic in 2 days to receive 2 units of packed red blood cells.  Return to clinic in 2 weeks for further  evaluation and continuation of treatment with Aranesp if his hemoglobin is below 11.0.  Will attempt to minimize blood transfusions.  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  Premedications include Benadryl, Tylenol, and Solu-Medrol.  Patient has follow-up at [facility] on August 23, 2021. 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  Recent and repeat IgA levels were undetectable with a normal IgM and IgG.  Patient also has anti-IgA antibodies.  Patients with isolated IgA deficiency can have anaphylactoid reactions to blood transfusions.   Shortness of breath: Patient does not complain of this today.  Follow-up with pulmonary as indicated. Positive ANA: Patient noted to have a positive and ANA at his work-up at [facility].  The clinical significance of this is unclear.  I spent a total of 30 minutes reviewing chart  data, face-to-face evaluation with the patient, counseling and coordination of care as detailed above.   Patient expressed understanding and was in agreement with this plan. He also understands that He can call clinic at any time with any questions, concerns, or complaints.    Lloyd Huger, MD   06/03/2021 1:58 PM

## 2021-06-01 ENCOUNTER — Encounter: Payer: Self-pay | Admitting: Oncology

## 2021-06-01 ENCOUNTER — Inpatient Hospital Stay: Payer: Medicare HMO

## 2021-06-01 ENCOUNTER — Inpatient Hospital Stay: Payer: Medicare HMO | Admitting: Oncology

## 2021-06-01 VITALS — BP 123/55 | HR 69 | Temp 97.0°F | Resp 18 | Wt 328.2 lb

## 2021-06-01 DIAGNOSIS — D469 Myelodysplastic syndrome, unspecified: Secondary | ICD-10-CM

## 2021-06-01 DIAGNOSIS — D759 Disease of blood and blood-forming organs, unspecified: Secondary | ICD-10-CM | POA: Diagnosis not present

## 2021-06-01 DIAGNOSIS — D802 Selective deficiency of immunoglobulin A [IgA]: Secondary | ICD-10-CM | POA: Diagnosis not present

## 2021-06-01 LAB — CBC WITH DIFFERENTIAL/PLATELET
Abs Immature Granulocytes: 0.07 10*3/uL (ref 0.00–0.07)
Basophils Absolute: 0 10*3/uL (ref 0.0–0.1)
Basophils Relative: 0 %
Eosinophils Absolute: 0 10*3/uL (ref 0.0–0.5)
Eosinophils Relative: 0 %
HCT: 19.2 % — ABNORMAL LOW (ref 39.0–52.0)
Hemoglobin: 6.4 g/dL — ABNORMAL LOW (ref 13.0–17.0)
Immature Granulocytes: 2 %
Lymphocytes Relative: 8 %
Lymphs Abs: 0.4 10*3/uL — ABNORMAL LOW (ref 0.7–4.0)
MCH: 34.6 pg — ABNORMAL HIGH (ref 26.0–34.0)
MCHC: 33.3 g/dL (ref 30.0–36.0)
MCV: 103.8 fL — ABNORMAL HIGH (ref 80.0–100.0)
Monocytes Absolute: 0.5 10*3/uL (ref 0.1–1.0)
Monocytes Relative: 11 %
Neutro Abs: 3.6 10*3/uL (ref 1.7–7.7)
Neutrophils Relative %: 79 %
Platelets: 235 10*3/uL (ref 150–400)
RBC: 1.85 MIL/uL — ABNORMAL LOW (ref 4.22–5.81)
RDW: 17.6 % — ABNORMAL HIGH (ref 11.5–15.5)
WBC: 4.6 10*3/uL (ref 4.0–10.5)
nRBC: 0 % (ref 0.0–0.2)

## 2021-06-01 LAB — SAMPLE TO BLOOD BANK

## 2021-06-01 MED ORDER — DARBEPOETIN ALFA 500 MCG/ML IJ SOSY
500.0000 ug | PREFILLED_SYRINGE | Freq: Once | INTRAMUSCULAR | Status: AC
  Administered 2021-06-01: 500 ug via SUBCUTANEOUS
  Filled 2021-06-01: qty 1

## 2021-06-01 NOTE — Progress Notes (Signed)
Patient here today for follow up regarding anemia, patient reports increased fatigue and shortness of breath.

## 2021-06-03 ENCOUNTER — Encounter: Payer: Self-pay | Admitting: Oncology

## 2021-06-03 ENCOUNTER — Inpatient Hospital Stay: Payer: Medicare HMO

## 2021-06-03 DIAGNOSIS — D802 Selective deficiency of immunoglobulin A [IgA]: Secondary | ICD-10-CM | POA: Diagnosis not present

## 2021-06-03 DIAGNOSIS — D759 Disease of blood and blood-forming organs, unspecified: Secondary | ICD-10-CM

## 2021-06-03 LAB — PREPARE RBC (CROSSMATCH)

## 2021-06-03 MED ORDER — METHYLPREDNISOLONE SODIUM SUCC 125 MG IJ SOLR
40.0000 mg | Freq: Once | INTRAMUSCULAR | Status: AC
  Administered 2021-06-03: 40 mg via INTRAVENOUS
  Filled 2021-06-03: qty 2

## 2021-06-03 MED ORDER — SODIUM CHLORIDE 0.9% IV SOLUTION
250.0000 mL | Freq: Once | INTRAVENOUS | Status: AC
  Administered 2021-06-03: 250 mL via INTRAVENOUS
  Filled 2021-06-03: qty 250

## 2021-06-03 MED ORDER — DIPHENHYDRAMINE HCL 50 MG/ML IJ SOLN
25.0000 mg | Freq: Once | INTRAMUSCULAR | Status: AC
  Administered 2021-06-03: 25 mg via INTRAVENOUS
  Filled 2021-06-03: qty 1

## 2021-06-03 MED ORDER — ACETAMINOPHEN 325 MG PO TABS
650.0000 mg | ORAL_TABLET | Freq: Once | ORAL | Status: AC
  Administered 2021-06-03: 650 mg via ORAL
  Filled 2021-06-03: qty 2

## 2021-06-03 NOTE — Patient Instructions (Signed)
Blood Transfusion, Adult, Care After This sheet gives you information about how to care for yourself after your procedure. Your doctor may also give you more specific instructions. If you have problems or questions, contact your doctor. What can I expect after the procedure? After the procedure, it is common to have: Bruising and soreness at the IV site. A headache. Follow these instructions at home: Insertion site care     Follow instructions from your doctor about how to take care of your insertion site. This is where an IV tube was put into your vein. Make sure you: Wash your hands with soap and water before and after you change your bandage (dressing). If you cannot use soap and water, use hand sanitizer. Change your bandage as told by your doctor. Check your insertion site every day for signs of infection. Check for: Redness, swelling, or pain. Bleeding from the site. Warmth. Pus or a bad smell. General instructions Take over-the-counter and prescription medicines only as told by your doctor. Rest as told by your doctor. Go back to your normal activities as told by your doctor. Keep all follow-up visits as told by your doctor. This is important. Contact a doctor if: You have itching or red, swollen areas of skin (hives). You feel worried or nervous (anxious). You feel weak after doing your normal activities. You have redness, swelling, warmth, or pain around the insertion site. You have blood coming from the insertion site, and the blood does not stop with pressure. You have pus or a bad smell coming from the insertion site. Get help right away if: You have signs of a serious reaction. This may be coming from an allergy or the body's defense system (immune system). Signs include: Trouble breathing or shortness of breath. Swelling of the face or feeling warm (flushed). Fever or chills. Head, chest, or back pain. Dark pee (urine) or blood in the pee. Widespread rash. Fast  heartbeat. Feeling dizzy or light-headed. You may receive your blood transfusion in an outpatient setting. If so, you will be told whom to contact to report any reactions. These symptoms may be an emergency. Do not wait to see if the symptoms will go away. Get medical help right away. Call your local emergency services (911 in the [place] Do not drive yourself to the hospital. Summary Bruising and soreness at the IV site are common. Check your insertion site every day for signs of infection. Rest as told by your doctor. Go back to your normal activities as told by your doctor. Get help right away if you have signs of a serious reaction. This information is not intended to replace advice given to you by your health care provider. Make sure you discuss any questions you have with your health care provider. Document Revised: 04/29/2020 Document Reviewed: 06/27/2018 Elsevier Patient Education  2023 [employer]  

## 2021-06-07 LAB — TYPE AND SCREEN
ABO/RH(D): O POS
Antibody Screen: POSITIVE
DAT, IgG: POSITIVE
DAT, complement: POSITIVE
Unit division: 0
Unit division: 0

## 2021-06-07 LAB — BPAM RBC
Blood Product Expiration Date: 202305192120
Blood Product Expiration Date: 202305192210
ISSUE DATE / TIME: 202305190850
ISSUE DATE / TIME: 202305191037
Unit Type and Rh: 5100
Unit Type and Rh: 5100

## 2021-06-10 NOTE — Progress Notes (Unsigned)
[place]  [phone] [phone]  ID: Dorathy Kinsman OB: 1965-05-08  MR#: [id number]  KCM#:[id number]  Patient Care Team: Sofie Hartigan, MD as PCP - General (Family Medicine) Lloyd Huger, MD as Consulting Physician (Oncology)  CHIEF COMPLAINT: Idiopathic cytopenia of undetermined significance.    INTERVAL HISTORY: Patient returns to clinic today for further evaluation, continuation of Aranesp, and consideration of additional blood.  His weakness and fatigue have improved.  He otherwise feels well.  He has no neurologic complaints.  He denies any recent fevers.  He has a fair appetite, but denies weight loss.  He has no chest pain, or hemoptysis.  He denies any nausea, vomiting, constipation, or diarrhea.  He has no further melena or hematochezia.  He has no urinary complaints.  Patient offers no further specific complaints today.  REVIEW OF SYSTEMS:   Review of Systems  Constitutional:  Positive for malaise/fatigue. Negative for fever and weight loss.  Respiratory: Negative.  Negative for cough, hemoptysis and shortness of breath.   Cardiovascular: Negative.  Negative for chest pain and leg swelling.  Gastrointestinal: Negative.  Negative for abdominal pain, blood in stool and melena.  Genitourinary: Negative.  Negative for hematuria.  Musculoskeletal: Negative.  Negative for back pain.  Skin: Negative.  Negative for rash.  Neurological:  Positive for weakness. Negative for dizziness, focal weakness and headaches.  Psychiatric/Behavioral: Negative.  The patient is not nervous/anxious.    As per HPI. Otherwise, a complete review of systems is negative.  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[place])     Morbid obesity ([place])    Sleep apnea     PAST SURGICAL HISTORY: Past Surgical History:  Procedure Laterality Date   COLONOSCOPY WITH PROPOFOL N/A 07/13/2020   Procedure: COLONOSCOPY WITH PROPOFOL;  Surgeon: Lesly Rubenstein, MD;  Location: [facility] ENDOSCOPY;  Service: Endoscopy;  Laterality: N/A;   COLONOSCOPY WITH PROPOFOL N/A 12/23/2020   Procedure: COLONOSCOPY WITH PROPOFOL;  Surgeon: Lucilla Lame, MD;  Location: [facility] ENDOSCOPY;  Service: Endoscopy;  Laterality: N/A;   ESOPHAGOGASTRODUODENOSCOPY (EGD) WITH PROPOFOL N/A 12/23/2020   Procedure: ESOPHAGOGASTRODUODENOSCOPY (EGD) WITH PROPOFOL;  Surgeon: Lucilla Lame, MD;  Location: [facility] ENDOSCOPY;  Service: Endoscopy;  Laterality: N/A;   GIVENS CAPSULE STUDY N/A 12/29/2020   Procedure: GIVENS CAPSULE STUDY;  Surgeon: Lin Landsman, MD;  Location: [facility] ENDOSCOPY;  Service: Gastroenterology;  Laterality: N/A;   TOOTH EXTRACTION      FAMILY HISTORY: Family History  Problem Relation Age of Onset   Benign prostatic hyperplasia Father    Psoriasis Father    Hypertension Mother    Hyperlipidemia Mother    Diabetes Maternal Grandmother    Diabetes Paternal Grandmother     ADVANCED DIRECTIVES (Y/N):  N  HEALTH MAINTENANCE: Social History   Tobacco Use   Smoking status: Never   Smokeless tobacco: Never  Vaping Use   Vaping Use: Never used  Substance Use Topics   Alcohol use: No   Drug use: No     Colonoscopy:  PAP:  Bone density:  Lipid panel:  Allergies  Allergen Reactions   Ampicillin Diarrhea   Bactrim [Sulfamethoxazole-Trimethoprim] Hives   Cephalexin    Claritin [  Loratadine] Other (See Comments)    Irritates throat   Penicillins Rash    Current Outpatient Medications  Medication Sig Dispense Refill   acarbose (PRECOSE) 25 MG tablet Take 25 mg by mouth 3 (three) times daily with meals.     amLODipine (NORVASC) 10 MG tablet Take 10 mg by mouth daily.     fenofibrate 160 MG tablet Take 160 mg by mouth daily.      fexofenadine (ALLEGRA) 180 MG tablet Take 180 mg by mouth daily.     fluticasone (FLONASE) 50 MCG/ACT nasal spray Place 2 sprays into both nostrils daily as needed.     glimepiride (AMARYL) 4 MG tablet Take 4 mg by mouth daily with breakfast.     HIBICLENS 4 % external liquid APPLY TOPICALLY DAILY AS NEEDED. TO WASHLEGS AND GROIN AREA 120 mL 3   Hydrocortisone-Aloe 1 % CREA Apply 1 application topically 2 (two) times daily as needed (itching). To itchy area on legs     ketoconazole (NIZORAL) 2 % cream Apply to rash in groin once a day as needed for flares. 60 g 5   latanoprost (XALATAN) 0.005 % ophthalmic solution Apply to eye.     lidocaine (LMX) 4 % cream Apply 1 application topically as needed.     lovastatin (MEVACOR) 10 MG tablet Take 10 mg by mouth daily.     metFORMIN (GLUCOPHAGE-XR) 500 MG 24 hr tablet Take 500-1,000 mg by mouth 2 (two) times daily. Takes 1 tablet with breakfast and 2 tablets with supper     metoprolol tartrate (LOPRESSOR) 50 MG tablet Take 50 mg by mouth 2 (two) times daily.     Multiple Vitamins-Minerals (MULTIVITAMIN WITH MINERALS) tablet Take 1 tablet by mouth daily.     mupirocin ointment (BACTROBAN) 2 % Apply to boils and ulcers QD PRN flares. 22 g 3   omeprazole (PRILOSEC) 20 MG capsule Take 20 mg by mouth daily.     pioglitazone (ACTOS) 30 MG tablet Take 30 mg by mouth daily.     ramipril (ALTACE) 10 MG capsule Take 10 mg by mouth 2 (two) times daily.     spironolactone (ALDACTONE) 50 MG tablet Take 50 mg by mouth daily.     terazosin (HYTRIN) 10 MG capsule Take 10 mg by mouth daily.      torsemide (DEMADEX) 20 MG tablet Take 20 mg by mouth daily.     traZODone (DESYREL) 150 MG tablet Take 150 mg by mouth at bedtime.     TRESIBA FLEXTOUCH 200 UNIT/ML FlexTouch Pen Inject 32 Units into the skin in the morning.     trolamine salicylate (ASPERCREME) 10 % cream Apply 1 application topically as needed for muscle pain.     vitamin B-12 (CYANOCOBALAMIN) 1000 MCG  tablet Take 1,000 mcg by mouth daily.     FARXIGA 10 MG TABS tablet Take 10 mg by mouth daily. (Patient not taking: Reported on 05/04/2021)     ferrous sulfate 325 (65 FE) MG EC tablet Take 1 tablet (325 mg total) by mouth 2 (two) times daily. (Patient not taking: Reported on 05/04/2021) 60 tablet 3   No current facility-administered medications for this visit.    OBJECTIVE: Vitals:   06/15/21 0917  BP: (!) 120/44  Pulse: 67  Resp: 20  Temp: 97.6 F (36.4 C)  SpO2: 100%      Body mass index is 50.43 kg/m.    ECOG FS:1 - Symptomatic but completely ambulatory  General: Well-developed, well-nourished, no acute distress. Eyes:  Pink conjunctiva, anicteric sclera. HEENT: Normocephalic, moist mucous membranes. Lungs: No audible wheezing or coughing. Heart: Regular rate and rhythm. Abdomen: Soft, nontender, no obvious distention. Musculoskeletal: No edema, cyanosis, or clubbing. Neuro: Alert, answering all questions appropriately. Cranial nerves grossly intact. Skin: No rashes or petechiae noted. Psych: Normal affect.   LAB RESULTS:  Lab Results  Component Value Date   NA 136 01/20/2021   K 3.3 (L) 01/20/2021   CL 99 01/20/2021   CO2 26 01/20/2021   GLUCOSE 106 (H) 01/20/2021   BUN 41 (H) 01/20/2021   CREATININE 1.12 01/20/2021   CALCIUM 8.8 (L) 01/20/2021   PROT 6.8 01/20/2021   ALBUMIN 3.4 (L) 01/20/2021   AST 14 (L) 01/20/2021   ALT 13 01/20/2021   ALKPHOS 36 (L) 01/20/2021   BILITOT 1.0 01/20/2021   GFRNONAA >60 01/20/2021   GFRAA >60 10/25/2016    Lab Results  Component Value Date   WBC 3.7 (L) 06/15/2021   NEUTROABS 3.1 06/15/2021   HGB 7.6 (L) 06/15/2021   HCT 23.0 (L) 06/15/2021   MCV 100.4 (H) 06/15/2021   PLT 116 (L) 06/15/2021     STUDIES: ECHOCARDIOGRAM COMPLETE  Result Date: 05/26/2021    ECHOCARDIOGRAM REPORT   Patient Name:   Patrick Duncan Date of Exam: 05/25/2021 Medical Rec #:  [id number]         Height:       67.0 in Accession #:     [id number]        Weight:       335.0 lb Date of Birth:  01/09/1966        BSA:          2.518 m Patient Age:    56 years          BP:           144/52 mmHg Patient Gender: M                 HR:           69 bpm. Exam Location:  [facility] Procedure: 2D Echo, Cardiac Doppler and Color Doppler Indications:     I31.39 Pericardial effusion without cardiac tamponade.  History:         Patient has prior history of Echocardiogram examinations, most                  recent 12/23/2020. Risk Factors:Hypertension, Diabetes and Sleep                  Apnea.  Sonographer:     Sherrie Sport Referring Phys:  [id number] [place] WH[place] Diagnosing Phys: Serafina Royals MD  Sonographer Comments: No apical window, suboptimal subcostal window and suboptimal parasternal window. IMPRESSIONS  1. Left ventricular ejection fraction, by estimation, is 60 to 65%. The left ventricle has normal function. The left ventricle has no regional wall motion abnormalities. Left ventricular diastolic parameters were normal.  2. Right ventricular systolic function is normal. The right ventricular size is normal.  3. A small pericardial effusion is present.  4. The mitral valve is normal in structure. Trivial mitral valve regurgitation.  5. The aortic valve is normal in structure. Aortic valve regurgitation is not visualized. FINDINGS  Left Ventricle: Left ventricular ejection fraction, by estimation, is 60 to 65%. The left ventricle has normal function. The left ventricle has no regional wall motion abnormalities. The left ventricular internal cavity size was normal in size. There is  no left ventricular hypertrophy. Left ventricular diastolic  parameters were normal. Right Ventricle: The right ventricular size is normal. No increase in right ventricular wall thickness. Right ventricular systolic function is normal. Left Atrium: Left atrial size was normal in size. Right Atrium: Right atrial size was normal in size. Pericardium: A small pericardial effusion is present.  Mitral Valve: The mitral valve is normal in structure. Trivial mitral valve regurgitation. Tricuspid Valve: The tricuspid valve is normal in structure. Tricuspid valve regurgitation is trivial. Aortic Valve: The aortic valve is normal in structure. Aortic valve regurgitation is not visualized. Pulmonic Valve: The pulmonic valve was normal in structure. Pulmonic valve regurgitation is trivial. Aorta: The aortic root and ascending aorta are structurally normal, with no evidence of dilitation. IAS/Shunts: No atrial level shunt detected by color flow Doppler.  LEFT VENTRICLE PLAX 2D LVIDd:         5.30 cm LVIDs:         3.70 cm LV PW:         1.50 cm LV IVS:        1.40 cm LVOT diam:     2.00 cm LVOT Area:     3.14 cm  LEFT ATRIUM         Index LA diam:    5.50 cm 2.18 cm/m                        PULMONIC VALVE AORTA                 RVOT Peak grad: 8 mmHg Ao Root diam: 3.10 cm   SHUNTS Systemic Diam: 2.00 cm Pulmonic VTI:  0.331 m Serafina Royals MD Electronically signed by Serafina Royals MD Signature Date/Time: 05/26/2021/12:21:04 PM    Final     ASSESSMENT:Idiopathic cytopenia of undetermined significance.  PLAN:    Idiopathic cytopenia of undetermined significance: Bone marrow biopsy on January 03, 2021 revealed a hypercellular marrow with erythroid hyperplasia and dyserythropoiesis.  Patient initially thought to have MDS, but second opinion from [facility] did not feel his bone marrow supported this diagnosis.  No blasts, increased plasma cells, or clonality was noted.  Cytogenetics were reported as normal.  Recommendation is to treat with Aranesp 500 mcg every 2 weeks.  If patient does not have an adequate response in 3 months, consider repeat bone marrow biopsy.  Patient had initial response to Aranesp, but now has required several blood transfusions recently.  Patient's hemoglobin is improved to 7.6 today.  Proceed with Aranesp as scheduled.  Return to clinic in 2 days for 1 unit of packed red blood cells.   Patient is going on a trip to [place], therefore he will return to clinic next week for lab check and blood transfusion if needed and then again in 2 weeks for laboratory work, further evaluation, and continuation of treatment.  Continue treatment with Aranesp if his hemoglobin is below 11.0.  Will attempt to minimize blood transfusions.  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  Premedications include Benadryl, Tylenol, and Solu-Medrol.  Patient has follow-up at [facility] on August 23, 2021. 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  Recent and repeat IgA levels were undetectable with a normal IgM and IgG.  Patient also has anti-IgA antibodies.  Patients with isolated IgA deficiency can have anaphylactoid reactions to blood transfusions.   Shortness of breath: Patient does not complain of this today.  Follow-up  with pulmonary as indicated. Positive ANA: Patient noted to have a positive and ANA at his work-up at [facility].  The clinical significance of this is unclear.  I spent a total of 30 minutes reviewing chart data, face-to-face evaluation with the patient, counseling and coordination of care as detailed above.    Patient expressed understanding and was in agreement with this plan. He also understands that He can call clinic at any time with any questions, concerns, or complaints.    Lloyd Huger, MD   06/16/2021 6:41 AM

## 2021-06-15 ENCOUNTER — Other Ambulatory Visit: Payer: Self-pay | Admitting: Oncology

## 2021-06-15 ENCOUNTER — Inpatient Hospital Stay: Payer: Medicare HMO

## 2021-06-15 ENCOUNTER — Encounter: Payer: Self-pay | Admitting: Oncology

## 2021-06-15 ENCOUNTER — Inpatient Hospital Stay: Payer: Medicare HMO | Admitting: Oncology

## 2021-06-15 VITALS — BP 120/44 | HR 67 | Temp 97.6°F | Resp 20 | Ht 67.0 in | Wt 322.0 lb

## 2021-06-15 DIAGNOSIS — D802 Selective deficiency of immunoglobulin A [IgA]: Secondary | ICD-10-CM | POA: Diagnosis not present

## 2021-06-15 DIAGNOSIS — D759 Disease of blood and blood-forming organs, unspecified: Secondary | ICD-10-CM

## 2021-06-15 DIAGNOSIS — D469 Myelodysplastic syndrome, unspecified: Secondary | ICD-10-CM

## 2021-06-15 LAB — CBC WITH DIFFERENTIAL/PLATELET
Abs Immature Granulocytes: 0.02 10*3/uL (ref 0.00–0.07)
Basophils Absolute: 0 10*3/uL (ref 0.0–0.1)
Basophils Relative: 0 %
Eosinophils Absolute: 0 10*3/uL (ref 0.0–0.5)
Eosinophils Relative: 0 %
HCT: 23 % — ABNORMAL LOW (ref 39.0–52.0)
Hemoglobin: 7.6 g/dL — ABNORMAL LOW (ref 13.0–17.0)
Immature Granulocytes: 1 %
Lymphocytes Relative: 7 %
Lymphs Abs: 0.3 10*3/uL — ABNORMAL LOW (ref 0.7–4.0)
MCH: 33.2 pg (ref 26.0–34.0)
MCHC: 33 g/dL (ref 30.0–36.0)
MCV: 100.4 fL — ABNORMAL HIGH (ref 80.0–100.0)
Monocytes Absolute: 0.3 10*3/uL (ref 0.1–1.0)
Monocytes Relative: 9 %
Neutro Abs: 3.1 10*3/uL (ref 1.7–7.7)
Neutrophils Relative %: 83 %
Platelets: 116 10*3/uL — ABNORMAL LOW (ref 150–400)
RBC: 2.29 MIL/uL — ABNORMAL LOW (ref 4.22–5.81)
RDW: 15.9 % — ABNORMAL HIGH (ref 11.5–15.5)
WBC: 3.7 10*3/uL — ABNORMAL LOW (ref 4.0–10.5)
nRBC: 0 % (ref 0.0–0.2)

## 2021-06-15 MED ORDER — DARBEPOETIN ALFA 500 MCG/ML IJ SOSY
500.0000 ug | PREFILLED_SYRINGE | Freq: Once | INTRAMUSCULAR | Status: AC
  Administered 2021-06-15: 500 ug via SUBCUTANEOUS
  Filled 2021-06-15: qty 1

## 2021-06-16 ENCOUNTER — Encounter: Payer: Self-pay | Admitting: Oncology

## 2021-06-17 ENCOUNTER — Inpatient Hospital Stay: Payer: Medicare HMO | Attending: Oncology

## 2021-06-17 DIAGNOSIS — D759 Disease of blood and blood-forming organs, unspecified: Secondary | ICD-10-CM

## 2021-06-17 LAB — SAMPLE TO BLOOD BANK

## 2021-06-17 MED ORDER — DIPHENHYDRAMINE HCL 50 MG/ML IJ SOLN
25.0000 mg | Freq: Once | INTRAMUSCULAR | Status: AC
  Administered 2021-06-17: 25 mg via INTRAVENOUS
  Filled 2021-06-17: qty 1

## 2021-06-17 MED ORDER — SODIUM CHLORIDE 0.9% IV SOLUTION
250.0000 mL | Freq: Once | INTRAVENOUS | Status: AC
  Administered 2021-06-17: 250 mL via INTRAVENOUS
  Filled 2021-06-17: qty 250

## 2021-06-17 MED ORDER — METHYLPREDNISOLONE SODIUM SUCC 125 MG IJ SOLR
40.0000 mg | Freq: Once | INTRAMUSCULAR | Status: AC
  Administered 2021-06-17: 40 mg via INTRAVENOUS
  Filled 2021-06-17: qty 2

## 2021-06-17 MED ORDER — ACETAMINOPHEN 325 MG PO TABS
650.0000 mg | ORAL_TABLET | Freq: Once | ORAL | Status: AC
  Administered 2021-06-17: 650 mg via ORAL
  Filled 2021-06-17: qty 2

## 2021-06-17 NOTE — Progress Notes (Signed)
Blood bank notified RN that I will not be able to scan in the unit of blood into epic due to having to use down time measures while making this RBC unit. Per blood bank okay to proceed with the assigned unit of RBCs for pt. Per blood bank, once IT has resolved the issue, they will contact RN to proceed with charting blood transfusion in Epic.   Pre-blood Vitals taken at 0930- T-97 tympanic, HR-67, RR-19, BP-118/46  Blood started at 0955 according to protocol with 2 RNs at rate 120 ml/hr  At 1010 rate adjusted to 232m/hr per protocol. Vitals taken- T-97 tympanic, HR-66, RR-19, BP-102/44  Blood completed at 1108 with a volume of 203 mL  Post vitals taken at 1115- T- 97.7 tympanic, HR-67, RR-19, BP 112/51  Pt tolerated blood transfusion well with no complications. VSS. Pt stable at discharge.  Blood bank notified RN at 1145. RN was able to chart the above information and complete blood transfusion in Epic at this time.   Christopher Glasscock WCIGNA

## 2021-06-20 LAB — PREPARE RBC (CROSSMATCH)

## 2021-06-22 ENCOUNTER — Inpatient Hospital Stay: Payer: Medicare HMO | Attending: Oncology

## 2021-06-22 ENCOUNTER — Ambulatory Visit: Payer: Medicare HMO | Admitting: Dermatology

## 2021-06-22 ENCOUNTER — Telehealth: Payer: Self-pay

## 2021-06-22 DIAGNOSIS — D802 Selective deficiency of immunoglobulin A [IgA]: Secondary | ICD-10-CM | POA: Diagnosis present

## 2021-06-22 DIAGNOSIS — D759 Disease of blood and blood-forming organs, unspecified: Secondary | ICD-10-CM | POA: Insufficient documentation

## 2021-06-22 DIAGNOSIS — D469 Myelodysplastic syndrome, unspecified: Secondary | ICD-10-CM

## 2021-06-22 LAB — CBC WITH DIFFERENTIAL/PLATELET
Abs Immature Granulocytes: 0.1 10*3/uL — ABNORMAL HIGH (ref 0.00–0.07)
Basophils Absolute: 0 10*3/uL (ref 0.0–0.1)
Basophils Relative: 0 %
Eosinophils Absolute: 0 10*3/uL (ref 0.0–0.5)
Eosinophils Relative: 0 %
HCT: 25.7 % — ABNORMAL LOW (ref 39.0–52.0)
Hemoglobin: 8.6 g/dL — ABNORMAL LOW (ref 13.0–17.0)
Immature Granulocytes: 2 %
Lymphocytes Relative: 5 %
Lymphs Abs: 0.3 10*3/uL — ABNORMAL LOW (ref 0.7–4.0)
MCH: 34.1 pg — ABNORMAL HIGH (ref 26.0–34.0)
MCHC: 33.5 g/dL (ref 30.0–36.0)
MCV: 102 fL — ABNORMAL HIGH (ref 80.0–100.0)
Monocytes Absolute: 0.4 10*3/uL (ref 0.1–1.0)
Monocytes Relative: 8 %
Neutro Abs: 4.5 10*3/uL (ref 1.7–7.7)
Neutrophils Relative %: 85 %
Platelets: 221 10*3/uL (ref 150–400)
RBC: 2.52 MIL/uL — ABNORMAL LOW (ref 4.22–5.81)
RDW: 16.1 % — ABNORMAL HIGH (ref 11.5–15.5)
WBC: 5.3 10*3/uL (ref 4.0–10.5)
nRBC: 0 % (ref 0.0–0.2)

## 2021-06-22 LAB — SAMPLE TO BLOOD BANK

## 2021-06-22 NOTE — Telephone Encounter (Signed)
Called to inform patient Hgb 8.6 per Dr. Grayland Ormond blood transfusion cancelled for Friday 6/9, no answer left message

## 2021-06-23 LAB — TYPE AND SCREEN
ABO/RH(D): O POS
Antibody Screen: POSITIVE
DAT, IgG: POSITIVE
DAT, complement: NEGATIVE
Unit division: 0

## 2021-06-23 LAB — BPAM RBC
Blood Product Expiration Date: 202306022155
ISSUE DATE / TIME: 202306021127
Unit Type and Rh: 5100

## 2021-06-24 ENCOUNTER — Inpatient Hospital Stay: Payer: Medicare HMO

## 2021-06-27 ENCOUNTER — Ambulatory Visit: Payer: Medicare HMO | Admitting: Dermatology

## 2021-06-28 NOTE — Progress Notes (Unsigned)
[place]  [phone] Fax:(336) [phone]  ID: Dorathy Kinsman OB: [birth year]  MR#: [id number]  AQT#:[id number]  Patient Care Team: Sofie Hartigan, MD as PCP - General (Family Medicine) Lloyd Huger, MD as Consulting Physician (Oncology)  CHIEF COMPLAINT: Idiopathic cytopenia of undetermined significance.    INTERVAL HISTORY: Patient returns to clinic today for repeat laboratory work, further evaluation, and continuation of Aranesp.  His weakness and fatigue have improved.  He has no neurologic complaints.  He denies any recent fevers.  He has a fair appetite, but denies weight loss.  He has no chest pain, or hemoptysis.  He denies any nausea, vomiting, constipation, or diarrhea.  He has no further melena or hematochezia.  He has no urinary complaints.  Patient offers no further specific complaints today.  REVIEW OF SYSTEMS:   Review of Systems  Constitutional:  Positive for malaise/fatigue. Negative for fever and weight loss.  Respiratory: Negative.  Negative for cough, hemoptysis and shortness of breath.   Cardiovascular: Negative.  Negative for chest pain and leg swelling.  Gastrointestinal: Negative.  Negative for abdominal pain, blood in stool and melena.  Genitourinary: Negative.  Negative for hematuria.  Musculoskeletal: Negative.  Negative for back pain.  Skin: Negative.  Negative for rash.  Neurological:  Positive for weakness. Negative for dizziness, focal weakness and headaches.  Psychiatric/Behavioral: Negative.  The patient is not nervous/anxious.     As per HPI. Otherwise, a complete review of systems is negative.  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[place])    Morbid obesity ([place])    Sleep  apnea     PAST SURGICAL HISTORY: Past Surgical History:  Procedure Laterality Date   COLONOSCOPY WITH PROPOFOL N/A 07/13/2020   Procedure: COLONOSCOPY WITH PROPOFOL;  Surgeon: Lesly Rubenstein, MD;  Location: [facility] ENDOSCOPY;  Service: Endoscopy;  Laterality: N/A;   COLONOSCOPY WITH PROPOFOL N/A 12/23/2020   Procedure: COLONOSCOPY WITH PROPOFOL;  Surgeon: Lucilla Lame, MD;  Location: [facility] ENDOSCOPY;  Service: Endoscopy;  Laterality: N/A;   ESOPHAGOGASTRODUODENOSCOPY (EGD) WITH PROPOFOL N/A 12/23/2020   Procedure: ESOPHAGOGASTRODUODENOSCOPY (EGD) WITH PROPOFOL;  Surgeon: Lucilla Lame, MD;  Location: [facility] ENDOSCOPY;  Service: Endoscopy;  Laterality: N/A;   GIVENS CAPSULE STUDY N/A 12/29/2020   Procedure: GIVENS CAPSULE STUDY;  Surgeon: Lin Landsman, MD;  Location: [facility] ENDOSCOPY;  Service: Gastroenterology;  Laterality: N/A;   TOOTH EXTRACTION      FAMILY HISTORY: Family History  Problem Relation Age of Onset   Benign prostatic hyperplasia Father    Psoriasis Father    Hypertension Mother    Hyperlipidemia Mother    Diabetes Maternal Grandmother    Diabetes Paternal Grandmother     ADVANCED DIRECTIVES (Y/N):  N  HEALTH MAINTENANCE: Social History   Tobacco Use   Smoking status: Never   Smokeless tobacco: Never  Vaping Use   Vaping Use: Never used  Substance Use Topics   Alcohol use: No   Drug use: No     Colonoscopy:  PAP:  Bone density:  Lipid panel:  Allergies  Allergen Reactions   Ampicillin Diarrhea   Bactrim [Sulfamethoxazole-Trimethoprim] Hives   Cephalexin    Claritin [Loratadine] Other (See Comments)  Irritates throat   Penicillins Rash    Current Outpatient Medications  Medication Sig Dispense Refill   acarbose (PRECOSE) 25 MG tablet Take 25 mg by mouth 3 (three) times daily with meals.     amLODipine (NORVASC) 10 MG tablet Take 10 mg by mouth daily.     fenofibrate 160 MG tablet Take 160 mg by mouth daily.     fexofenadine (ALLEGRA) 180  MG tablet Take 180 mg by mouth daily.     fluticasone (FLONASE) 50 MCG/ACT nasal spray Place 2 sprays into both nostrils daily as needed.     glimepiride (AMARYL) 4 MG tablet Take 4 mg by mouth daily with breakfast.     HIBICLENS 4 % external liquid APPLY TOPICALLY DAILY AS NEEDED. TO WASHLEGS AND GROIN AREA 120 mL 3   Hydrocortisone-Aloe 1 % CREA Apply 1 application topically 2 (two) times daily as needed (itching). To itchy area on legs     ketoconazole (NIZORAL) 2 % cream Apply to rash in groin once a day as needed for flares. 60 g 5   latanoprost (XALATAN) 0.005 % ophthalmic solution Apply to eye.     lidocaine (LMX) 4 % cream Apply 1 application topically as needed.     lovastatin (MEVACOR) 10 MG tablet Take 10 mg by mouth daily.     metFORMIN (GLUCOPHAGE-XR) 500 MG 24 hr tablet Take 500-1,000 mg by mouth 2 (two) times daily. Takes 1 tablet with breakfast and 2 tablets with supper     metoprolol tartrate (LOPRESSOR) 50 MG tablet Take 50 mg by mouth 2 (two) times daily.     Multiple Vitamins-Minerals (MULTIVITAMIN WITH MINERALS) tablet Take 1 tablet by mouth daily.     mupirocin ointment (BACTROBAN) 2 % Apply to boils and ulcers QD PRN flares. 22 g 3   omeprazole (PRILOSEC) 20 MG capsule Take 20 mg by mouth daily.     pioglitazone (ACTOS) 30 MG tablet Take 30 mg by mouth daily.     ramipril (ALTACE) 10 MG capsule Take 10 mg by mouth 2 (two) times daily.     spironolactone (ALDACTONE) 50 MG tablet Take 50 mg by mouth daily.     terazosin (HYTRIN) 10 MG capsule Take 10 mg by mouth daily.      torsemide (DEMADEX) 20 MG tablet Take 20 mg by mouth daily.     traZODone (DESYREL) 150 MG tablet Take 150 mg by mouth at bedtime.     TRESIBA FLEXTOUCH 200 UNIT/ML FlexTouch Pen Inject 32 Units into the skin in the morning.     trolamine salicylate (ASPERCREME) 10 % cream Apply 1 application topically as needed for muscle pain.     vitamin B-12 (CYANOCOBALAMIN) 1000 MCG tablet Take 1,000 mcg by mouth  daily.     FARXIGA 10 MG TABS tablet Take 10 mg by mouth daily. (Patient not taking: Reported on 05/04/2021)     ferrous sulfate 325 (65 FE) MG EC tablet Take 1 tablet (325 mg total) by mouth 2 (two) times daily. (Patient not taking: Reported on 05/04/2021) 60 tablet 3   No current facility-administered medications for this visit.    OBJECTIVE: Vitals:   06/29/21 1012  BP: (!) 129/49  Pulse: 66  Resp: 16  Temp: 98 F (36.7 C)  SpO2: 100%      Body mass index is 49.56 kg/m.    ECOG FS:1 - Symptomatic but completely ambulatory  General: Well-developed, well-nourished, no acute distress. Eyes: Pink conjunctiva, anicteric sclera. HEENT: Normocephalic, moist  mucous membranes. Lungs: No audible wheezing or coughing. Heart: Regular rate and rhythm. Abdomen: Soft, nontender, no obvious distention. Musculoskeletal: No edema, cyanosis, or clubbing. Neuro: Alert, answering all questions appropriately. Cranial nerves grossly intact. Skin: No rashes or petechiae noted. Psych: Normal affect.  LAB RESULTS:  Lab Results  Component Value Date   NA 136 01/20/2021   K 3.3 (L) 01/20/2021   CL 99 01/20/2021   CO2 26 01/20/2021   GLUCOSE 106 (H) 01/20/2021   BUN 41 (H) 01/20/2021   CREATININE 1.12 01/20/2021   CALCIUM 8.8 (L) 01/20/2021   PROT 6.8 01/20/2021   ALBUMIN 3.4 (L) 01/20/2021   AST 14 (L) 01/20/2021   ALT 13 01/20/2021   ALKPHOS 36 (L) 01/20/2021   BILITOT 1.0 01/20/2021   GFRNONAA >60 01/20/2021   GFRAA >60 10/25/2016    Lab Results  Component Value Date   WBC 4.4 06/29/2021   NEUTROABS 3.8 06/29/2021   HGB 8.6 (L) 06/29/2021   HCT 25.5 (L) 06/29/2021   MCV 99.6 06/29/2021   PLT 232 06/29/2021     STUDIES: No results found.  ASSESSMENT:Idiopathic cytopenia of undetermined significance.  PLAN:    Idiopathic cytopenia of undetermined significance: Bone marrow biopsy on January 03, 2021 revealed a hypercellular marrow with erythroid hyperplasia and  dyserythropoiesis.  Patient initially thought to have MDS, but second opinion from [facility] did not feel his bone marrow supported this diagnosis.  No blasts, increased plasma cells, or clonality was noted.  Cytogenetics were reported as normal.  Recommendation is to treat with Aranesp 500 mcg every 2 weeks.  If patient does not have an adequate response in 3 months, consider repeat bone marrow biopsy.  Patient continues to require transfusions, but less frequently.  Hemoglobin is 8.6 today, therefore proceed with Aranesp as scheduled.  He does not require blood transfusion.  Return to clinic in 2 weeks for laboratory work and continuation of treatment.  Patient will then return to clinic in 4 weeks for laboratory work, further evaluation, and continuation of treatment.   Continue treatment with Aranesp if his hemoglobin is below 11.0.  Will attempt to minimize blood transfusions.  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  Premedications include Benadryl, Tylenol, and Solu-Medrol.  Patient has follow-up at [facility] on August 23, 2021. 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  Recent and repeat IgA levels were undetectable with a normal IgM and IgG.  Patient also has anti-IgA antibodies.  Patients with isolated IgA deficiency can have anaphylactoid reactions to blood transfusions.   Shortness of breath: Patient does not complain of this today.  Follow-up with pulmonary as indicated. Positive ANA: Patient noted to have a positive and ANA at his work-up at [facility].  The clinical significance of this is unclear.  I spent a total of 30 minutes reviewing chart data, face-to-face evaluation with the patient, counseling and coordination of care as detailed above.   Patient expressed understanding and was in agreement with this plan. He also understands that He can call clinic at any time with any questions,  concerns, or complaints.    Lloyd Huger, MD   06/29/2021 12:02 PM

## 2021-06-29 ENCOUNTER — Telehealth: Payer: Self-pay | Admitting: *Deleted

## 2021-06-29 ENCOUNTER — Encounter: Payer: Self-pay | Admitting: Oncology

## 2021-06-29 ENCOUNTER — Inpatient Hospital Stay: Payer: Medicare HMO

## 2021-06-29 ENCOUNTER — Inpatient Hospital Stay (HOSPITAL_BASED_OUTPATIENT_CLINIC_OR_DEPARTMENT_OTHER): Payer: Medicare HMO | Admitting: Oncology

## 2021-06-29 ENCOUNTER — Other Ambulatory Visit: Payer: Self-pay

## 2021-06-29 VITALS — BP 129/49 | HR 66 | Temp 98.0°F | Resp 16 | Ht 67.0 in | Wt 316.4 lb

## 2021-06-29 DIAGNOSIS — D759 Disease of blood and blood-forming organs, unspecified: Secondary | ICD-10-CM

## 2021-06-29 DIAGNOSIS — D802 Selective deficiency of immunoglobulin A [IgA]: Secondary | ICD-10-CM | POA: Diagnosis not present

## 2021-06-29 DIAGNOSIS — D469 Myelodysplastic syndrome, unspecified: Secondary | ICD-10-CM

## 2021-06-29 LAB — CBC WITH DIFFERENTIAL/PLATELET
Abs Immature Granulocytes: 0.03 10*3/uL (ref 0.00–0.07)
Basophils Absolute: 0 10*3/uL (ref 0.0–0.1)
Basophils Relative: 1 %
Eosinophils Absolute: 0 10*3/uL (ref 0.0–0.5)
Eosinophils Relative: 0 %
HCT: 25.5 % — ABNORMAL LOW (ref 39.0–52.0)
Hemoglobin: 8.6 g/dL — ABNORMAL LOW (ref 13.0–17.0)
Immature Granulocytes: 1 %
Lymphocytes Relative: 6 %
Lymphs Abs: 0.3 10*3/uL — ABNORMAL LOW (ref 0.7–4.0)
MCH: 33.6 pg (ref 26.0–34.0)
MCHC: 33.7 g/dL (ref 30.0–36.0)
MCV: 99.6 fL (ref 80.0–100.0)
Monocytes Absolute: 0.4 10*3/uL (ref 0.1–1.0)
Monocytes Relative: 8 %
Neutro Abs: 3.8 10*3/uL (ref 1.7–7.7)
Neutrophils Relative %: 84 %
Platelets: 232 10*3/uL (ref 150–400)
RBC: 2.56 MIL/uL — ABNORMAL LOW (ref 4.22–5.81)
RDW: 14.7 % (ref 11.5–15.5)
WBC: 4.4 10*3/uL (ref 4.0–10.5)
nRBC: 0 % (ref 0.0–0.2)

## 2021-06-29 LAB — SAMPLE TO BLOOD BANK

## 2021-06-29 MED ORDER — DARBEPOETIN ALFA 500 MCG/ML IJ SOSY
500.0000 ug | PREFILLED_SYRINGE | Freq: Once | INTRAMUSCULAR | Status: AC
  Administered 2021-06-29: 500 ug via SUBCUTANEOUS
  Filled 2021-06-29: qty 1

## 2021-06-29 NOTE — Telephone Encounter (Signed)
Call placed to patient at request made through [place] message. Patient had questions regarding 6/28 appts. Patient had MD visit scheduled in addition to lab/aranesp. MD visit cancelled per wrap up patient to only have lab/aranesp on 6/28. Patient verbalized understanding of updated appts.

## 2021-07-01 ENCOUNTER — Inpatient Hospital Stay: Payer: Medicare HMO

## 2021-07-07 ENCOUNTER — Ambulatory Visit: Payer: Medicare HMO | Admitting: Dermatology

## 2021-07-13 ENCOUNTER — Inpatient Hospital Stay: Payer: Medicare HMO

## 2021-07-13 ENCOUNTER — Ambulatory Visit: Payer: Medicare HMO | Admitting: Oncology

## 2021-07-13 VITALS — BP 152/50 | HR 71

## 2021-07-13 DIAGNOSIS — D469 Myelodysplastic syndrome, unspecified: Secondary | ICD-10-CM

## 2021-07-13 DIAGNOSIS — D759 Disease of blood and blood-forming organs, unspecified: Secondary | ICD-10-CM

## 2021-07-13 DIAGNOSIS — D802 Selective deficiency of immunoglobulin A [IgA]: Secondary | ICD-10-CM | POA: Diagnosis not present

## 2021-07-13 LAB — CBC WITH DIFFERENTIAL/PLATELET
Abs Immature Granulocytes: 0.06 10*3/uL (ref 0.00–0.07)
Basophils Absolute: 0 10*3/uL (ref 0.0–0.1)
Basophils Relative: 0 %
Eosinophils Absolute: 0 10*3/uL (ref 0.0–0.5)
Eosinophils Relative: 0 %
HCT: 28.5 % — ABNORMAL LOW (ref 39.0–52.0)
Hemoglobin: 9.6 g/dL — ABNORMAL LOW (ref 13.0–17.0)
Immature Granulocytes: 2 %
Lymphocytes Relative: 7 %
Lymphs Abs: 0.2 10*3/uL — ABNORMAL LOW (ref 0.7–4.0)
MCH: 33.7 pg (ref 26.0–34.0)
MCHC: 33.7 g/dL (ref 30.0–36.0)
MCV: 100 fL (ref 80.0–100.0)
Monocytes Absolute: 0.3 10*3/uL (ref 0.1–1.0)
Monocytes Relative: 10 %
Neutro Abs: 2.7 10*3/uL (ref 1.7–7.7)
Neutrophils Relative %: 81 %
Platelets: 188 10*3/uL (ref 150–400)
RBC: 2.85 MIL/uL — ABNORMAL LOW (ref 4.22–5.81)
RDW: 15 % (ref 11.5–15.5)
WBC: 3.4 10*3/uL — ABNORMAL LOW (ref 4.0–10.5)
nRBC: 0 % (ref 0.0–0.2)

## 2021-07-13 LAB — SAMPLE TO BLOOD BANK

## 2021-07-13 MED ORDER — DARBEPOETIN ALFA 500 MCG/ML IJ SOSY
500.0000 ug | PREFILLED_SYRINGE | Freq: Once | INTRAMUSCULAR | Status: AC
  Administered 2021-07-13: 500 ug via SUBCUTANEOUS
  Filled 2021-07-13: qty 1

## 2021-07-15 ENCOUNTER — Inpatient Hospital Stay: Payer: Medicare HMO

## 2021-07-26 NOTE — Progress Notes (Unsigned)
[place]  Telephone:(336) [phone] Fax:(336) [phone]  ID: Patrick Duncan OB: 19-Mar-1965  MR#: [id number]  NLG#:[id number]  Patient Care Team: Sofie Hartigan, MD as PCP - General (Family Medicine) Lloyd Huger, MD as Consulting Physician (Oncology)  CHIEF COMPLAINT: Idiopathic cytopenia of undetermined significance.    INTERVAL HISTORY: Patient returns to clinic today for repeat laboratory work, further evaluation, and continuation of Aranesp.  His weakness and fatigue have improved.  He has no neurologic complaints.  He denies any recent fevers.  He has a fair appetite, but denies weight loss.  He has no chest pain, or hemoptysis.  He denies any nausea, vomiting, constipation, or diarrhea.  He has no further melena or hematochezia.  He has no urinary complaints.  Patient offers no further specific complaints today.  REVIEW OF SYSTEMS:   Review of Systems  Constitutional:  Positive for malaise/fatigue. Negative for fever and weight loss.  Respiratory: Negative.  Negative for cough, hemoptysis and shortness of breath.   Cardiovascular: Negative.  Negative for chest pain and leg swelling.  Gastrointestinal: Negative.  Negative for abdominal pain, blood in stool and melena.  Genitourinary: Negative.  Negative for hematuria.  Musculoskeletal: Negative.  Negative for back pain.  Skin: Negative.  Negative for rash.  Neurological:  Positive for weakness. Negative for dizziness, focal weakness and headaches.  Psychiatric/Behavioral: Negative.  The patient is not nervous/anxious.     As per HPI. Otherwise, a complete review of systems is negative.  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[place])    Morbid obesity ([place])    Sleep  apnea     PAST SURGICAL HISTORY: Past Surgical History:  Procedure Laterality Date   COLONOSCOPY WITH PROPOFOL N/A 07/13/2020   Procedure: COLONOSCOPY WITH PROPOFOL;  Surgeon: Lesly Rubenstein, MD;  Location: [facility] ENDOSCOPY;  Service: Endoscopy;  Laterality: N/A;   COLONOSCOPY WITH PROPOFOL N/A 12/23/2020   Procedure: COLONOSCOPY WITH PROPOFOL;  Surgeon: Lucilla Lame, MD;  Location: [facility] ENDOSCOPY;  Service: Endoscopy;  Laterality: N/A;   ESOPHAGOGASTRODUODENOSCOPY (EGD) WITH PROPOFOL N/A 12/23/2020   Procedure: ESOPHAGOGASTRODUODENOSCOPY (EGD) WITH PROPOFOL;  Surgeon: Lucilla Lame, MD;  Location: [facility] ENDOSCOPY;  Service: Endoscopy;  Laterality: N/A;   GIVENS CAPSULE STUDY N/A 12/29/2020   Procedure: GIVENS CAPSULE STUDY;  Surgeon: Lin Landsman, MD;  Location: [facility] ENDOSCOPY;  Service: Gastroenterology;  Laterality: N/A;   TOOTH EXTRACTION      FAMILY HISTORY: Family History  Problem Relation Age of Onset   Benign prostatic hyperplasia Father    Psoriasis Father    Hypertension Mother    Hyperlipidemia Mother    Diabetes Maternal Grandmother    Diabetes Paternal Grandmother     ADVANCED DIRECTIVES (Y/N):  N  HEALTH MAINTENANCE: Social History   Tobacco Use   Smoking status: Never   Smokeless tobacco: Never  Vaping Use   Vaping Use: Never used  Substance Use Topics   Alcohol use: No   Drug use: No     Colonoscopy:  PAP:  Bone density:  Lipid panel:  Allergies  Allergen Reactions   Ampicillin Diarrhea   Bactrim [Sulfamethoxazole-Trimethoprim] Hives   Cephalexin    Claritin [Loratadine] Other (See Comments)  Irritates throat   Penicillins Rash    Current Outpatient Medications  Medication Sig Dispense Refill   acarbose (PRECOSE) 25 MG tablet Take 25 mg by mouth 3 (three) times daily with meals.     amLODipine (NORVASC) 10 MG tablet Take 10 mg by mouth daily.     FARXIGA 10 MG TABS tablet Take 10 mg by mouth daily. (Patient not taking: Reported on  05/04/2021)     fenofibrate 160 MG tablet Take 160 mg by mouth daily.     ferrous sulfate 325 (65 FE) MG EC tablet Take 1 tablet (325 mg total) by mouth 2 (two) times daily. (Patient not taking: Reported on 05/04/2021) 60 tablet 3   fexofenadine (ALLEGRA) 180 MG tablet Take 180 mg by mouth daily.     fluticasone (FLONASE) 50 MCG/ACT nasal spray Place 2 sprays into both nostrils daily as needed.     glimepiride (AMARYL) 4 MG tablet Take 4 mg by mouth daily with breakfast.     HIBICLENS 4 % external liquid APPLY TOPICALLY DAILY AS NEEDED. TO WASHLEGS AND GROIN AREA 120 mL 3   Hydrocortisone-Aloe 1 % CREA Apply 1 application topically 2 (two) times daily as needed (itching). To itchy area on legs     ketoconazole (NIZORAL) 2 % cream Apply to rash in groin once a day as needed for flares. 60 g 5   latanoprost (XALATAN) 0.005 % ophthalmic solution Apply to eye.     lidocaine (LMX) 4 % cream Apply 1 application topically as needed.     lovastatin (MEVACOR) 10 MG tablet Take 10 mg by mouth daily.     metFORMIN (GLUCOPHAGE-XR) 500 MG 24 hr tablet Take 500-1,000 mg by mouth 2 (two) times daily. Takes 1 tablet with breakfast and 2 tablets with supper     metoprolol tartrate (LOPRESSOR) 50 MG tablet Take 50 mg by mouth 2 (two) times daily.     Multiple Vitamins-Minerals (MULTIVITAMIN WITH MINERALS) tablet Take 1 tablet by mouth daily.     mupirocin ointment (BACTROBAN) 2 % Apply to boils and ulcers QD PRN flares. 22 g 3   omeprazole (PRILOSEC) 20 MG capsule Take 20 mg by mouth daily.     pioglitazone (ACTOS) 30 MG tablet Take 30 mg by mouth daily.     ramipril (ALTACE) 10 MG capsule Take 10 mg by mouth 2 (two) times daily.     spironolactone (ALDACTONE) 50 MG tablet Take 50 mg by mouth daily.     terazosin (HYTRIN) 10 MG capsule Take 10 mg by mouth daily.      torsemide (DEMADEX) 20 MG tablet Take 20 mg by mouth daily.     traZODone (DESYREL) 150 MG tablet Take 150 mg by mouth at bedtime.     TRESIBA  FLEXTOUCH 200 UNIT/ML FlexTouch Pen Inject 32 Units into the skin in the morning.     trolamine salicylate (ASPERCREME) 10 % cream Apply 1 application topically as needed for muscle pain.     vitamin B-12 (CYANOCOBALAMIN) 1000 MCG tablet Take 1,000 mcg by mouth daily.     No current facility-administered medications for this visit.    OBJECTIVE: There were no vitals filed for this visit.     There is no height or weight on file to calculate BMI.    ECOG FS:1 - Symptomatic but completely ambulatory  General: Well-developed, well-nourished, no acute distress. Eyes: Pink conjunctiva, anicteric sclera. HEENT: Normocephalic, moist mucous membranes. Lungs: No audible wheezing or coughing. Heart: Regular rate and  rhythm. Abdomen: Soft, nontender, no obvious distention. Musculoskeletal: No edema, cyanosis, or clubbing. Neuro: Alert, answering all questions appropriately. Cranial nerves grossly intact. Skin: No rashes or petechiae noted. Psych: Normal affect.  LAB RESULTS:  Lab Results  Component Value Date   NA 136 01/20/2021   K 3.3 (L) 01/20/2021   CL 99 01/20/2021   CO2 26 01/20/2021   GLUCOSE 106 (H) 01/20/2021   BUN 41 (H) 01/20/2021   CREATININE 1.12 01/20/2021   CALCIUM 8.8 (L) 01/20/2021   PROT 6.8 01/20/2021   ALBUMIN 3.4 (L) 01/20/2021   AST 14 (L) 01/20/2021   ALT 13 01/20/2021   ALKPHOS 36 (L) 01/20/2021   BILITOT 1.0 01/20/2021   GFRNONAA >60 01/20/2021   GFRAA >60 10/25/2016    Lab Results  Component Value Date   WBC 3.4 (L) 07/13/2021   NEUTROABS 2.7 07/13/2021   HGB 9.6 (L) 07/13/2021   HCT 28.5 (L) 07/13/2021   MCV 100.0 07/13/2021   PLT 188 07/13/2021     STUDIES: No results found.  ASSESSMENT:Idiopathic cytopenia of undetermined significance.  PLAN:    Idiopathic cytopenia of undetermined significance: Bone marrow biopsy on January 03, 2021 revealed a hypercellular marrow with erythroid hyperplasia and dyserythropoiesis.  Patient initially  thought to have MDS, but second opinion from [facility] did not feel his bone marrow supported this diagnosis.  No blasts, increased plasma cells, or clonality was noted.  Cytogenetics were reported as normal.  Recommendation is to treat with Aranesp 500 mcg every 2 weeks.  If patient does not have an adequate response in 3 months, consider repeat bone marrow biopsy.  Patient continues to require transfusions, but less frequently.  Hemoglobin is 8.6 today, therefore proceed with Aranesp as scheduled.  He does not require blood transfusion.  Return to clinic in 2 weeks for laboratory work and continuation of treatment.  Patient will then return to clinic in 4 weeks for laboratory work, further evaluation, and continuation of treatment.   Continue treatment with Aranesp if his hemoglobin is below 11.0.  Will attempt to minimize blood transfusions.  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  Premedications include Benadryl, Tylenol, and Solu-Medrol.  Patient has follow-up at [facility] on August 23, 2021. 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  Recent and repeat IgA levels were undetectable with a normal IgM and IgG.  Patient also has anti-IgA antibodies.  Patients with isolated IgA deficiency can have anaphylactoid reactions to blood transfusions.   Shortness of breath: Patient does not complain of this today.  Follow-up with pulmonary as indicated. Positive ANA: Patient noted to have a positive and ANA at his work-up at [facility].  The clinical significance of this is unclear.  I spent a total of 30 minutes reviewing chart data, face-to-face evaluation with the patient, counseling and coordination of care as detailed above.   Patient expressed understanding and was in agreement with this plan. He also understands that He can call clinic at any time with any questions, concerns, or complaints.    Lloyd Huger, MD   07/26/2021 7:18 AM

## 2021-07-27 ENCOUNTER — Inpatient Hospital Stay (HOSPITAL_BASED_OUTPATIENT_CLINIC_OR_DEPARTMENT_OTHER): Payer: Medicare HMO | Admitting: Oncology

## 2021-07-27 ENCOUNTER — Inpatient Hospital Stay: Payer: Medicare HMO | Attending: Oncology

## 2021-07-27 ENCOUNTER — Encounter: Payer: Self-pay | Admitting: Oncology

## 2021-07-27 ENCOUNTER — Inpatient Hospital Stay: Payer: Medicare HMO

## 2021-07-27 VITALS — BP 133/50 | HR 61 | Temp 98.2°F | Resp 18 | Ht 67.0 in | Wt 325.6 lb

## 2021-07-27 DIAGNOSIS — D759 Disease of blood and blood-forming organs, unspecified: Secondary | ICD-10-CM

## 2021-07-27 DIAGNOSIS — D469 Myelodysplastic syndrome, unspecified: Secondary | ICD-10-CM

## 2021-07-27 DIAGNOSIS — D802 Selective deficiency of immunoglobulin A [IgA]: Secondary | ICD-10-CM | POA: Insufficient documentation

## 2021-07-27 LAB — CBC WITH DIFFERENTIAL/PLATELET
Abs Immature Granulocytes: 0.03 10*3/uL (ref 0.00–0.07)
Basophils Absolute: 0 10*3/uL (ref 0.0–0.1)
Basophils Relative: 1 %
Eosinophils Absolute: 0 10*3/uL (ref 0.0–0.5)
Eosinophils Relative: 0 %
HCT: 29 % — ABNORMAL LOW (ref 39.0–52.0)
Hemoglobin: 9.9 g/dL — ABNORMAL LOW (ref 13.0–17.0)
Immature Granulocytes: 1 %
Lymphocytes Relative: 6 %
Lymphs Abs: 0.2 10*3/uL — ABNORMAL LOW (ref 0.7–4.0)
MCH: 34.4 pg — ABNORMAL HIGH (ref 26.0–34.0)
MCHC: 34.1 g/dL (ref 30.0–36.0)
MCV: 100.7 fL — ABNORMAL HIGH (ref 80.0–100.0)
Monocytes Absolute: 0.3 10*3/uL (ref 0.1–1.0)
Monocytes Relative: 10 %
Neutro Abs: 2.8 10*3/uL (ref 1.7–7.7)
Neutrophils Relative %: 82 %
Platelets: 240 10*3/uL (ref 150–400)
RBC: 2.88 MIL/uL — ABNORMAL LOW (ref 4.22–5.81)
RDW: 14.6 % (ref 11.5–15.5)
WBC: 3.4 10*3/uL — ABNORMAL LOW (ref 4.0–10.5)
nRBC: 0 % (ref 0.0–0.2)

## 2021-07-27 MED ORDER — DARBEPOETIN ALFA 500 MCG/ML IJ SOSY
500.0000 ug | PREFILLED_SYRINGE | Freq: Once | INTRAMUSCULAR | Status: AC
  Administered 2021-07-27: 500 ug via SUBCUTANEOUS
  Filled 2021-07-27: qty 1

## 2021-07-28 ENCOUNTER — Telehealth: Payer: Self-pay

## 2021-07-28 ENCOUNTER — Ambulatory Visit: Payer: Medicare HMO | Admitting: Dermatology

## 2021-07-28 DIAGNOSIS — L03119 Cellulitis of unspecified part of limb: Secondary | ICD-10-CM | POA: Diagnosis not present

## 2021-07-28 DIAGNOSIS — L304 Erythema intertrigo: Secondary | ICD-10-CM | POA: Diagnosis not present

## 2021-07-28 DIAGNOSIS — L732 Hidradenitis suppurativa: Secondary | ICD-10-CM | POA: Diagnosis not present

## 2021-07-28 DIAGNOSIS — D649 Anemia, unspecified: Secondary | ICD-10-CM | POA: Diagnosis not present

## 2021-07-28 LAB — SAMPLE TO BLOOD BANK

## 2021-07-28 MED ORDER — KETOCONAZOLE 2 % EX CREA: TOPICAL_CREAM | CUTANEOUS | 11 refills | Status: DC

## 2021-07-28 NOTE — Telephone Encounter (Signed)
Patient called in regards to my chart message.

## 2021-07-28 NOTE — Progress Notes (Signed)
   Follow-Up Visit   Subjective  Patrick Duncan is a 56 y.o. male who presents for the following: Follow-up (6 months f/u cellulitis of the legs, treating Ketoconazole cream and Hibiclens wash with a good response, patient took Doxycycline tablet for a few months but stopped because he was doing better.). Pt has been hospitalized 3 different times since his last visit her due severe anemia, being treated by an Hematologist/Oncologist.  The following portions of the chart were reviewed this encounter and updated as appropriate:   Tobacco  Allergies  Meds  Problems  Med Hx  Surg Hx  Fam Hx     Review of Systems:  No other skin or systemic complaints except as noted in HPI or Assessment and Plan.  Objective  Well appearing patient in no apparent distress; mood and affect are within normal limits.  A focused examination was performed including legs/thighs,groin,buttock,. Relevant physical exam findings are noted in the Assessment and Plan.  groin, thighs Clear today   groin, buttock Clear today   groin Clear today    Assessment & Plan  Severe anemia unknown Severe Anemia   Continue treatment directed by hematologist/oncologist   Cellulitis of lower extremity, unspecified laterality groin, thighs History of Non-pressure chronic ulcer of skin of And  Cellulitis - improved   Hx of drainage but doesn't appear to be infected today  - Culture + for Staph aureus sensitive to Sulfa and Oxicillin (resistant to tetracycline); but pt has allergy to Sulfa and Penicillin and Cephalexin. Currently clear today.  Continue Ketoconazole 2% cream apply to affected skin daily Continue Hibiclens wash    Related Medications ketoconazole (NIZORAL) 2 % cream Apply to rash in groin once a day as needed for flares.  Intertrigo groin, buttock Intertrigo is a chronic recurrent rash that occurs in skin fold areas that may be associated with friction; heat; moisture; yeast; fungus; and  bacteria.  It is exacerbated by increased movement / activity; sweating; and higher atmospheric temperature.   Improved with current treatment.   Continue ketoconazole 2% cream Apply to AAs QD prn rash dsp 60g 5Rf.   Hidradenitis suppurativa groin  Hidradenitis Suppurativa is a chronic; persistent; non-curable, but treatable condition due to abnormal inflamed sweat glands in the body folds (axilla, inframammary, groin, medial thighs), causing recurrent painful draining cysts and scarring. It can be associated with severe scarring acne and cysts; also abscesses and scarring of scalp. The goal is control and prevention of flares, as it is not curable. Scars are permanent and can be thickened. Treatment may include daily use of topical medication and oral antibiotics.  Oral isotretinoin may also be helpful.  For more severe cases, Humira (a biologic injection) may be prescribed to decrease the inflammatory process and prevent flares.  When indicated, inflamed cysts may also be treated surgically.   Patient instructed to call here if any flares and we will re start Doxycycline   Erythema intertrigo Related Medications ketoconazole (NIZORAL) 2 % cream Apply to rash in groin once a day as needed for flares.  Return in about 1 year (around 07/29/2022) for intertrigo and Hidradenitis .  IMarye Round, CMA, am acting as scribe for Sarina Ser, MD .  Documentation: I have reviewed the above documentation for accuracy and completeness, and I agree with the above.  Sarina Ser, MD

## 2021-07-28 NOTE — Patient Instructions (Signed)
Due to recent changes in healthcare laws, you may see results of your pathology and/or laboratory studies on MyChart before the doctors have had a chance to review them. We understand that in some cases there may be results that are confusing or concerning to you. Please understand that not all results are received at the same time and often the doctors may need to interpret multiple results in order to provide you with the best plan of care or course of treatment. Therefore, we ask that you please give us 2 business days to thoroughly review all your results before contacting the office for clarification. Should we see a critical lab result, you will be contacted sooner.   If You Need Anything After Your Visit  If you have any questions or concerns for your doctor, please call our main line at [phone] and press option 4 to reach your doctor's medical assistant. If no one answers, please leave a voicemail as directed and we will return your call as soon as possible. Messages left after 4 pm will be answered the following business day.   You may also send us a message via MyChart. We typically respond to MyChart messages within 1-2 business days.  For prescription refills, please ask your pharmacy to contact our office. Our fax number is [phone].  If you have an urgent issue when the clinic is closed that cannot wait until the next business day, you can page your doctor at the number below.    Please note that while we do our best to be available for urgent issues outside of office hours, we are not available 24/7.   If you have an urgent issue and are unable to reach us, you may choose to seek medical care at your doctor's office, retail clinic, urgent care center, or emergency room.  If you have a medical emergency, please immediately call 911 or go to the emergency department.  Pager Numbers  - Dr. Kowalski: [phone]  - Dr. Moye: [phone]  - Dr. Stewart:  [phone]  In the event of inclement weather, please call our main line at [phone] for an update on the status of any delays or closures.  Dermatology Medication Tips: Please keep the boxes that topical medications come in in order to help keep track of the instructions about where and how to use these. Pharmacies typically print the medication instructions only on the boxes and not directly on the medication tubes.   If your medication is too expensive, please contact our office at [phone] option 4 or send us a message through MyChart.   We are unable to tell what your co-pay for medications will be in advance as this is different depending on your insurance coverage. However, we may be able to find a substitute medication at lower cost or fill out paperwork to get insurance to cover a needed medication.   If a prior authorization is required to get your medication covered by your insurance company, please allow us 1-2 business days to complete this process.  Drug prices often vary depending on where the prescription is filled and some pharmacies may offer cheaper prices.  The website www.goodrx.com contains coupons for medications through different pharmacies. The prices here do not account for what the cost may be with help from insurance (it may be cheaper with your insurance), but the website can give you the price if you did not use any insurance.  - You can print the associated coupon and take it with   your prescription to the pharmacy.  - You may also stop by our office during regular business hours and pick up a GoodRx coupon card.  - If you need your prescription sent electronically to a different pharmacy, notify our office through [facility] MyChart or by phone at [phone] option 4.     Si Usted Necesita Algo Despus de Su Visita  Tambin puede enviarnos un mensaje a travs de MyChart. Por lo general respondemos a los mensajes de MyChart en el transcurso de 1 a 2  das hbiles.  Para renovar recetas, por favor pida a su farmacia que se ponga en contacto con nuestra oficina. Nuestro nmero de fax es el [phone].  Si tiene un asunto urgente cuando la clnica est cerrada y que no puede esperar hasta el siguiente da hbil, puede llamar/localizar a su doctor(a) al nmero que aparece a continuacin.   Por favor, tenga en cuenta que aunque hacemos todo lo posible para estar disponibles para asuntos urgentes fuera del horario de oficina, no estamos disponibles las 24 horas del da, los 7 das de la semana.   Si tiene un problema urgente y no puede comunicarse con nosotros, puede optar por buscar atencin mdica  en el consultorio de su doctor(a), en una clnica privada, en un centro de atencin urgente o en una sala de emergencias.  Si tiene una emergencia mdica, por favor llame inmediatamente al 911 o vaya a la sala de emergencias.  Nmeros de bper  - Dr. Kowalski: [phone]  - Dra. Moye: [phone]  - Dra. Stewart: [phone]  En caso de inclemencias del tiempo, por favor llame a nuestra lnea principal al [phone] para una actualizacin sobre el estado de cualquier retraso o cierre.  Consejos para la medicacin en dermatologa: Por favor, guarde las cajas en las que vienen los medicamentos de uso tpico para ayudarle a seguir las instrucciones sobre dnde y cmo usarlos. Las farmacias generalmente imprimen las instrucciones del medicamento slo en las cajas y no directamente en los tubos del medicamento.   Si su medicamento es muy caro, por favor, pngase en contacto con nuestra oficina llamando al [phone] y presione la opcin 4 o envenos un mensaje a travs de MyChart.   No podemos decirle cul ser su copago por los medicamentos por adelantado ya que esto es diferente dependiendo de la cobertura de su seguro. Sin embargo, es posible que podamos encontrar un medicamento sustituto a menor costo o llenar un formulario para que el  seguro cubra el medicamento que se considera necesario.   Si se requiere una autorizacin previa para que su compaa de seguros cubra su medicamento, por favor permtanos de 1 a 2 das hbiles para completar este proceso.  Los precios de los medicamentos varan con frecuencia dependiendo del lugar de dnde se surte la receta y alguna farmacias pueden ofrecer precios ms baratos.  El sitio web www.goodrx.com tiene cupones para medicamentos de diferentes farmacias. Los precios aqu no tienen en cuenta lo que podra costar con la ayuda del seguro (puede ser ms barato con su seguro), pero el sitio web puede darle el precio si no utiliz ningn seguro.  - Puede imprimir el cupn correspondiente y llevarlo con su receta a la farmacia.  - Tambin puede pasar por nuestra oficina durante el horario de atencin regular y recoger una tarjeta de cupones de GoodRx.  - Si necesita que su receta se enve electrnicamente a una farmacia diferente, informe a nuestra oficina a travs de MyChart de [facility]   o por telfono llamando al [phone] y presione la opcin 4.      Due to recent changes in healthcare laws, you may see results of your pathology and/or laboratory studies on MyChart before the doctors have had a chance to review them. We understand that in some cases there may be results that are confusing or concerning to you. Please understand that not all results are received at the same time and often the doctors may need to interpret multiple results in order to provide you with the best plan of care or course of treatment. Therefore, we ask that you please give us 2 business days to thoroughly review all your results before contacting the office for clarification. Should we see a critical lab result, you will be contacted sooner.   If You Need Anything After Your Visit  If you have any questions or concerns for your doctor, please call our main line at [phone] and press option 4 to reach your  doctor's medical assistant. If no one answers, please leave a voicemail as directed and we will return your call as soon as possible. Messages left after 4 pm will be answered the following business day.   You may also send us a message via MyChart. We typically respond to MyChart messages within 1-2 business days.  For prescription refills, please ask your pharmacy to contact our office. Our fax number is [phone].  If you have an urgent issue when the clinic is closed that cannot wait until the next business day, you can page your doctor at the number below.    Please note that while we do our best to be available for urgent issues outside of office hours, we are not available 24/7.   If you have an urgent issue and are unable to reach us, you may choose to seek medical care at your doctor's office, retail clinic, urgent care center, or emergency room.  If you have a medical emergency, please immediately call 911 or go to the emergency department.  Pager Numbers  - Dr. Kowalski: [phone]  - Dr. Moye: [phone]  - Dr. Stewart: [phone]  In the event of inclement weather, please call our main line at [phone] for an update on the status of any delays or closures.  Dermatology Medication Tips: Please keep the boxes that topical medications come in in order to help keep track of the instructions about where and how to use these. Pharmacies typically print the medication instructions only on the boxes and not directly on the medication tubes.   If your medication is too expensive, please contact our office at [phone] option 4 or send us a message through MyChart.   We are unable to tell what your co-pay for medications will be in advance as this is different depending on your insurance coverage. However, we may be able to find a substitute medication at lower cost or fill out paperwork to get insurance to cover a needed medication.   If a prior authorization is  required to get your medication covered by your insurance company, please allow us 1-2 business days to complete this process.  Drug prices often vary depending on where the prescription is filled and some pharmacies may offer cheaper prices.  The website www.goodrx.com contains coupons for medications through different pharmacies. The prices here do not account for what the cost may be with help from insurance (it may be cheaper with your insurance), but the website can give you the price if you   did not use any insurance.  - You can print the associated coupon and take it with your prescription to the pharmacy.  - You may also stop by our office during regular business hours and pick up a GoodRx coupon card.  - If you need your prescription sent electronically to a different pharmacy, notify our office through  MyChart or by phone at [phone] option 4.     Si Usted Necesita Algo Despus de Su Visita  Tambin puede enviarnos un mensaje a travs de MyChart. Por lo general respondemos a los mensajes de MyChart en el transcurso de 1 a 2 das hbiles.  Para renovar recetas, por favor pida a su farmacia que se ponga en contacto con nuestra oficina. Nuestro nmero de fax es el [phone].  Si tiene un asunto urgente cuando la clnica est cerrada y que no puede esperar hasta el siguiente da hbil, puede llamar/localizar a su doctor(a) al nmero que aparece a continuacin.   Por favor, tenga en cuenta que aunque hacemos todo lo posible para estar disponibles para asuntos urgentes fuera del horario de oficina, no estamos disponibles las 24 horas del da, los 7 das de la semana.   Si tiene un problema urgente y no puede comunicarse con nosotros, puede optar por buscar atencin mdica  en el consultorio de su doctor(a), en una clnica privada, en un centro de atencin urgente o en una sala de emergencias.  Si tiene una emergencia mdica, por favor llame inmediatamente al 911 o vaya a  la sala de emergencias.  Nmeros de bper  - Dr. Kowalski: [phone]  - Dra. Moye: [phone]  - Dra. Stewart: [phone]  En caso de inclemencias del tiempo, por favor llame a nuestra lnea principal al [phone] para una actualizacin sobre el estado de cualquier retraso o cierre.  Consejos para la medicacin en dermatologa: Por favor, guarde las cajas en las que vienen los medicamentos de uso tpico para ayudarle a seguir las instrucciones sobre dnde y cmo usarlos. Las farmacias generalmente imprimen las instrucciones del medicamento slo en las cajas y no directamente en los tubos del medicamento.   Si su medicamento es muy caro, por favor, pngase en contacto con nuestra oficina llamando al [phone] y presione la opcin 4 o envenos un mensaje a travs de MyChart.   No podemos decirle cul ser su copago por los medicamentos por adelantado ya que esto es diferente dependiendo de la cobertura de su seguro. Sin embargo, es posible que podamos encontrar un medicamento sustituto a menor costo o llenar un formulario para que el seguro cubra el medicamento que se considera necesario.   Si se requiere una autorizacin previa para que su compaa de seguros cubra su medicamento, por favor permtanos de 1 a 2 das hbiles para completar este proceso.  Los precios de los medicamentos varan con frecuencia dependiendo del lugar de dnde se surte la receta y alguna farmacias pueden ofrecer precios ms baratos.  El sitio web www.goodrx.com tiene cupones para medicamentos de diferentes farmacias. Los precios aqu no tienen en cuenta lo que podra costar con la ayuda del seguro (puede ser ms barato con su seguro), pero el sitio web puede darle el precio si no utiliz ningn seguro.  - Puede imprimir el cupn correspondiente y llevarlo con su receta a la farmacia.  - Tambin puede pasar por nuestra oficina durante el horario de atencin regular y recoger una tarjeta de cupones de  GoodRx.  - Si necesita que su receta se   enve electrnicamente a una farmacia diferente, informe a nuestra oficina a travs de MyChart de  o por telfono llamando al [phone] y presione la opcin 4.  

## 2021-07-29 ENCOUNTER — Ambulatory Visit: Payer: Medicare HMO

## 2021-08-09 ENCOUNTER — Encounter: Payer: Self-pay | Admitting: Dermatology

## 2021-08-10 ENCOUNTER — Inpatient Hospital Stay: Payer: Medicare HMO

## 2021-08-10 ENCOUNTER — Other Ambulatory Visit: Payer: Medicare HMO

## 2021-08-10 ENCOUNTER — Ambulatory Visit: Payer: Medicare HMO

## 2021-08-10 VITALS — BP 135/52 | HR 72

## 2021-08-10 DIAGNOSIS — D469 Myelodysplastic syndrome, unspecified: Secondary | ICD-10-CM

## 2021-08-10 DIAGNOSIS — D802 Selective deficiency of immunoglobulin A [IgA]: Secondary | ICD-10-CM | POA: Diagnosis not present

## 2021-08-10 DIAGNOSIS — D759 Disease of blood and blood-forming organs, unspecified: Secondary | ICD-10-CM

## 2021-08-10 LAB — CBC WITH DIFFERENTIAL/PLATELET
Abs Immature Granulocytes: 0.04 10*3/uL (ref 0.00–0.07)
Basophils Absolute: 0 10*3/uL (ref 0.0–0.1)
Basophils Relative: 1 %
Eosinophils Absolute: 0 10*3/uL (ref 0.0–0.5)
Eosinophils Relative: 0 %
HCT: 30.6 % — ABNORMAL LOW (ref 39.0–52.0)
Hemoglobin: 10.5 g/dL — ABNORMAL LOW (ref 13.0–17.0)
Immature Granulocytes: 1 %
Lymphocytes Relative: 8 %
Lymphs Abs: 0.3 10*3/uL — ABNORMAL LOW (ref 0.7–4.0)
MCH: 34.1 pg — ABNORMAL HIGH (ref 26.0–34.0)
MCHC: 34.3 g/dL (ref 30.0–36.0)
MCV: 99.4 fL (ref 80.0–100.0)
Monocytes Absolute: 0.4 10*3/uL (ref 0.1–1.0)
Monocytes Relative: 11 %
Neutro Abs: 2.8 10*3/uL (ref 1.7–7.7)
Neutrophils Relative %: 79 %
Platelets: 222 10*3/uL (ref 150–400)
RBC: 3.08 MIL/uL — ABNORMAL LOW (ref 4.22–5.81)
RDW: 14.3 % (ref 11.5–15.5)
WBC: 3.5 10*3/uL — ABNORMAL LOW (ref 4.0–10.5)
nRBC: 0 % (ref 0.0–0.2)

## 2021-08-10 LAB — SAMPLE TO BLOOD BANK

## 2021-08-10 MED ORDER — DARBEPOETIN ALFA 500 MCG/ML IJ SOSY
500.0000 ug | PREFILLED_SYRINGE | Freq: Once | INTRAMUSCULAR | Status: AC
  Administered 2021-08-10: 500 ug via SUBCUTANEOUS
  Filled 2021-08-10: qty 1

## 2021-08-18 NOTE — Progress Notes (Signed)
[place]  [phone] [phone]  ID: Patrick Duncan OB: Mar 22, 1965  MR#: [id number]  QVZ#:[id number]  Patient Care Team: Sofie Hartigan, MD as PCP - General (Family Medicine) Lloyd Huger, MD as Consulting Physician (Oncology)  CHIEF COMPLAINT: Idiopathic cytopenia of undetermined significance.    INTERVAL HISTORY: Patient returns to clinic today for repeat laboratory work, further evaluation, and continuation of Aranesp.  He was seen in follow-up at [facility] yesterday and had laboratory work done.  He currently feels well.  He does not complain of any weakness or fatigue today. He has no neurologic complaints.  He denies any recent fevers.  He has a fair appetite, but denies weight loss.  He has no chest pain, or hemoptysis.  He denies any nausea, vomiting, constipation, or diarrhea.  He has no further melena or hematochezia.  He has no urinary complaints.  [name]  REVIEW OF SYSTEMS:   Review of Systems  Constitutional: Negative.  Negative for fever, malaise/fatigue and weight loss.  Respiratory: Negative.  Negative for cough, hemoptysis and shortness of breath.   Cardiovascular: Negative.  Negative for chest pain and leg swelling.  Gastrointestinal: Negative.  Negative for abdominal pain, blood in stool and melena.  Genitourinary: Negative.  Negative for hematuria.  Musculoskeletal: Negative.  Negative for back pain.  Skin: Negative.  Negative for rash.  Neurological: Negative.  Negative for dizziness, focal weakness, weakness and headaches.  Psychiatric/Behavioral: Negative.  The patient is not nervous/anxious.     As per HPI. Otherwise, a complete review of systems is negative.  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[place])    Morbid obesity ([place])    Sleep apnea     PAST SURGICAL HISTORY: Past Surgical History:  Procedure Laterality Date   COLONOSCOPY WITH PROPOFOL N/A 07/13/2020   Procedure: COLONOSCOPY WITH PROPOFOL;  Surgeon: Lesly Rubenstein, MD;  Location: [facility] ENDOSCOPY;  Service: Endoscopy;  Laterality: N/A;   COLONOSCOPY WITH PROPOFOL N/A 12/23/2020   Procedure: COLONOSCOPY WITH PROPOFOL;  Surgeon: Lucilla Lame, MD;  Location: [facility] ENDOSCOPY;  Service: Endoscopy;  Laterality: N/A;   ESOPHAGOGASTRODUODENOSCOPY (EGD) WITH PROPOFOL N/A 12/23/2020   Procedure: ESOPHAGOGASTRODUODENOSCOPY (EGD) WITH PROPOFOL;  Surgeon: Lucilla Lame, MD;  Location: [facility] ENDOSCOPY;  Service: Endoscopy;  Laterality: N/A;   GIVENS CAPSULE STUDY N/A 12/29/2020   Procedure: GIVENS CAPSULE STUDY;  Surgeon: Lin Landsman, MD;  Location: [facility] ENDOSCOPY;  Service: Gastroenterology;  Laterality: N/A;   TOOTH EXTRACTION      FAMILY HISTORY: Family History  Problem Relation Age of Onset   Benign prostatic hyperplasia Father    Psoriasis Father    Hypertension Mother    Hyperlipidemia Mother    Diabetes Maternal Grandmother    Diabetes Paternal Grandmother     ADVANCED DIRECTIVES (Y/N):  N  HEALTH MAINTENANCE: Social History   Tobacco Use   Smoking status: Never   Smokeless tobacco: Never  Vaping Use   Vaping Use: Never used  Substance Use Topics   Alcohol use: No   Drug use: No     Colonoscopy:  PAP:  Bone density:  Lipid panel:  Allergies  Allergen Reactions  Ampicillin Diarrhea   Bactrim [Sulfamethoxazole-Trimethoprim] Hives   Cephalexin    Claritin [Loratadine] Other (See Comments)    Irritates throat   Penicillins Rash    Current Outpatient Medications  Medication Sig Dispense Refill   amLODipine (NORVASC) 10 MG tablet Take 10 mg by mouth daily.     fenofibrate 160 MG tablet Take 160 mg by mouth daily.     fexofenadine  (ALLEGRA) 180 MG tablet Take 180 mg by mouth daily.     fluticasone (FLONASE) 50 MCG/ACT nasal spray Place 2 sprays into both nostrils daily as needed.     glimepiride (AMARYL) 4 MG tablet Take 4 mg by mouth daily with breakfast.     HIBICLENS 4 % external liquid APPLY TOPICALLY DAILY AS NEEDED. TO WASHLEGS AND GROIN AREA 120 mL 3   Hydrocortisone-Aloe 1 % CREA Apply 1 application topically 2 (two) times daily as needed (itching). To itchy area on legs     ketoconazole (NIZORAL) 2 % cream Apply to rash in groin once a day as needed for flares. 60 g 11   latanoprost (XALATAN) 0.005 % ophthalmic solution Apply to eye.     lovastatin (MEVACOR) 10 MG tablet Take 10 mg by mouth daily.     metFORMIN (GLUCOPHAGE-XR) 500 MG 24 hr tablet Take 500-1,000 mg by mouth 2 (two) times daily. Takes 1 tablet with breakfast and 2 tablets with supper     metoprolol tartrate (LOPRESSOR) 50 MG tablet Take 50 mg by mouth 2 (two) times daily.     Multiple Vitamins-Minerals (MULTIVITAMIN WITH MINERALS) tablet Take 1 tablet by mouth daily.     mupirocin ointment (BACTROBAN) 2 % Apply to boils and ulcers QD PRN flares. 22 g 3   omeprazole (PRILOSEC) 20 MG capsule Take 20 mg by mouth daily.     pioglitazone (ACTOS) 30 MG tablet Take 30 mg by mouth daily.     ramipril (ALTACE) 10 MG capsule Take 10 mg by mouth 2 (two) times daily.     spironolactone (ALDACTONE) 50 MG tablet Take 50 mg by mouth daily.     terazosin (HYTRIN) 10 MG capsule Take 10 mg by mouth daily.      torsemide (DEMADEX) 20 MG tablet Take 20 mg by mouth daily.     traZODone (DESYREL) 150 MG tablet Take 150 mg by mouth at bedtime.     TRESIBA FLEXTOUCH 200 UNIT/ML FlexTouch Pen Inject 32 Units into the skin in the morning.     vitamin B-12 (CYANOCOBALAMIN) 1000 MCG tablet Take 1,000 mcg by mouth daily.     acarbose (PRECOSE) 25 MG tablet Take 25 mg by mouth 3 (three) times daily with meals.     FARXIGA 10 MG TABS tablet Take 10 mg by mouth daily. (Patient  not taking: Reported on 05/04/2021)     ferrous sulfate 325 (65 FE) MG EC tablet Take 1 tablet (325 mg total) by mouth 2 (two) times daily. (Patient not taking: Reported on 05/04/2021) 60 tablet 3   lidocaine (LMX) 4 % cream Apply 1 application topically as needed.     trolamine salicylate (ASPERCREME) 10 % cream Apply 1 application topically as needed for muscle pain. (Patient not taking: Reported on 08/24/2021)     No current facility-administered medications for this visit.    OBJECTIVE: Vitals:   08/24/21 0932  BP: (!) 147/48  Pulse: 62  Resp: 20  SpO2: 99%      Body mass index is 50.9 kg/m.    ECOG  FS:0 - Asymptomatic  General: Well-developed, well-nourished, no acute distress. Eyes: Pink conjunctiva, anicteric sclera. HEENT: Normocephalic, moist mucous membranes. Lungs: No audible wheezing or coughing. Heart: Regular rate and rhythm. Abdomen: Soft, nontender, no obvious distention. Musculoskeletal: No edema, cyanosis, or clubbing. Neuro: Alert, answering all questions appropriately. Cranial nerves grossly intact. Skin: No rashes or petechiae noted. Psych: Normal affect.  LAB RESULTS:  Lab Results  Component Value Date   NA 136 01/20/2021   K 3.3 (L) 01/20/2021   CL 99 01/20/2021   CO2 26 01/20/2021   GLUCOSE 106 (H) 01/20/2021   BUN 41 (H) 01/20/2021   CREATININE 1.12 01/20/2021   CALCIUM 8.8 (L) 01/20/2021   PROT 6.8 01/20/2021   ALBUMIN 3.4 (L) 01/20/2021   AST 14 (L) 01/20/2021   ALT 13 01/20/2021   ALKPHOS 36 (L) 01/20/2021   BILITOT 1.0 01/20/2021   GFRNONAA >60 01/20/2021   GFRAA >60 10/25/2016    Lab Results  Component Value Date   WBC 3.5 (L) 08/10/2021   NEUTROABS 2.8 08/10/2021   HGB 10.5 (L) 08/10/2021   HCT 30.6 (L) 08/10/2021   MCV 99.4 08/10/2021   PLT 222 08/10/2021     STUDIES: No results found.  ASSESSMENT:Idiopathic cytopenia of undetermined significance.  PLAN:    Idiopathic cytopenia of undetermined significance: Bone marrow  biopsy on January 03, 2021 revealed a hypercellular marrow with erythroid hyperplasia and dyserythropoiesis.  Patient initially thought to have MDS, but second opinion from [facility] did not feel his bone marrow supported this diagnosis.  No blasts, increased plasma cells, or clonality was noted.  Cytogenetics were reported as normal.  Recommendation is to treat with Aranesp 500 mcg every 2 weeks.  Patient continues to require transfusions, but less frequently.  Patient's hemoglobin yesterday at [facility] was 10.4, therefore proceed with Aranesp as scheduled.  He does not require blood transfusion. Continue treatment with Aranesp if his hemoglobin is below 11.0.  Will attempt to minimize blood transfusions.  Once patient's hemoglobin is greater than 11, can consider decreasing frequency of Aranesp injections to every 3 weeks.  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  Return to clinic in 2 weeks for laboratory work and RNS only.  Patient will then return to clinic in 4 weeks for laboratory work, further evaluation, and continuation of treatment if needed.  Premedications for transfusion include Benadryl, Tylenol, and Solu-Medrol.  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  Recent and repeat IgA levels were undetectable with a normal IgM and IgG.  Patient also has anti-IgA antibodies.  Patients with isolated IgA deficiency can have anaphylactoid reactions to blood transfusions.   Shortness of breath: Patient does not complain of this today.  Follow-up with pulmonary as indicated. Positive ANA: Patient noted to have a positive and ANA at his work-up at [facility].  The clinical significance of this is unclear. Leukopenia: Mild, monitor.  I spent a total of 30 minutes reviewing chart data, face-to-face evaluation with the patient, counseling and coordination of care as detailed above.    Patient expressed  understanding and was in agreement with this plan. He also understands that He can call clinic at any time with any questions, concerns, or complaints.    Lloyd Huger, MD   08/25/2021 7:05 AM

## 2021-08-24 ENCOUNTER — Inpatient Hospital Stay: Payer: Medicare HMO | Attending: Oncology

## 2021-08-24 ENCOUNTER — Inpatient Hospital Stay: Payer: Medicare HMO

## 2021-08-24 ENCOUNTER — Other Ambulatory Visit: Payer: Medicare HMO

## 2021-08-24 ENCOUNTER — Ambulatory Visit: Payer: Medicare HMO | Admitting: Oncology

## 2021-08-24 ENCOUNTER — Inpatient Hospital Stay: Payer: Medicare HMO | Admitting: Oncology

## 2021-08-24 VITALS — BP 147/48 | HR 62 | Resp 20 | Wt 325.0 lb

## 2021-08-24 DIAGNOSIS — D802 Selective deficiency of immunoglobulin A [IgA]: Secondary | ICD-10-CM | POA: Insufficient documentation

## 2021-08-24 DIAGNOSIS — D759 Disease of blood and blood-forming organs, unspecified: Secondary | ICD-10-CM | POA: Insufficient documentation

## 2021-08-24 DIAGNOSIS — D469 Myelodysplastic syndrome, unspecified: Secondary | ICD-10-CM

## 2021-08-24 MED ORDER — DARBEPOETIN ALFA 500 MCG/ML IJ SOSY
500.0000 ug | PREFILLED_SYRINGE | Freq: Once | INTRAMUSCULAR | Status: AC
  Administered 2021-08-24: 500 ug via SUBCUTANEOUS
  Filled 2021-08-24: qty 1

## 2021-08-24 NOTE — Progress Notes (Unsigned)
Pt states if he does not get a good night sleep he will develop pain in his chest.

## 2021-08-25 ENCOUNTER — Encounter: Payer: Self-pay | Admitting: Oncology

## 2021-08-26 ENCOUNTER — Inpatient Hospital Stay: Payer: Medicare HMO

## 2021-08-30 ENCOUNTER — Other Ambulatory Visit: Payer: Self-pay | Admitting: Oncology

## 2021-08-30 DIAGNOSIS — R161 Splenomegaly, not elsewhere classified: Secondary | ICD-10-CM

## 2021-08-30 DIAGNOSIS — D759 Disease of blood and blood-forming organs, unspecified: Secondary | ICD-10-CM

## 2021-08-31 ENCOUNTER — Ambulatory Visit
Admission: RE | Admit: 2021-08-31 | Discharge: 2021-08-31 | Disposition: A | Discharge status: Home / Self Care | Payer: Medicare HMO | Source: Ambulatory Visit | Attending: Oncology | Admitting: Oncology

## 2021-08-31 DIAGNOSIS — D759 Disease of blood and blood-forming organs, unspecified: Secondary | ICD-10-CM | POA: Diagnosis present

## 2021-08-31 DIAGNOSIS — R161 Splenomegaly, not elsewhere classified: Secondary | ICD-10-CM | POA: Insufficient documentation

## 2021-09-01 ENCOUNTER — Encounter: Payer: Self-pay | Admitting: Oncology

## 2021-09-01 NOTE — Telephone Encounter (Signed)
Spoke with patient in regards to my chart message.

## 2021-09-07 ENCOUNTER — Other Ambulatory Visit: Payer: Medicare HMO

## 2021-09-07 ENCOUNTER — Inpatient Hospital Stay: Payer: Medicare HMO

## 2021-09-07 ENCOUNTER — Ambulatory Visit: Payer: Medicare HMO

## 2021-09-07 VITALS — BP 129/65 | HR 71

## 2021-09-07 DIAGNOSIS — D759 Disease of blood and blood-forming organs, unspecified: Secondary | ICD-10-CM

## 2021-09-07 DIAGNOSIS — D802 Selective deficiency of immunoglobulin A [IgA]: Secondary | ICD-10-CM | POA: Diagnosis not present

## 2021-09-07 LAB — HEMOGLOBIN: Hemoglobin: 10.7 g/dL — ABNORMAL LOW (ref 13.0–17.0)

## 2021-09-07 MED ORDER — DARBEPOETIN ALFA 500 MCG/ML IJ SOSY
500.0000 ug | PREFILLED_SYRINGE | Freq: Once | INTRAMUSCULAR | Status: AC
  Administered 2021-09-07: 500 ug via SUBCUTANEOUS
  Filled 2021-09-07: qty 1

## 2021-09-20 NOTE — Progress Notes (Signed)
[place]  Telephone:(336) [phone] Fax:(336) [phone]  ID: Patrick Duncan OB: 02-Jul-1965  MR#: [id number]  KGM#:[id number]  Patient Care Team: Sofie Hartigan, MD as PCP - General (Family Medicine) Lloyd Huger, MD as Consulting Physician (Oncology)  CHIEF COMPLAINT: Idiopathic cytopenia of undetermined significance.    INTERVAL HISTORY: Patient returns to clinic today for repeat laboratory work, further evaluation, and continuation of Aranesp.  He currently feels well.  He does not complain of any weakness or fatigue today. He has no neurologic complaints.  He denies any recent fevers.  He has a fair appetite, but denies weight loss.  He has no chest pain, or hemoptysis.  He denies any nausea, vomiting, constipation, or diarrhea.  He has no further melena or hematochezia.  He has no urinary complaints.  [name]  REVIEW OF SYSTEMS:   Review of Systems  Constitutional: Negative.  Negative for fever, malaise/fatigue and weight loss.  Respiratory: Negative.  Negative for cough, hemoptysis and shortness of breath.   Cardiovascular: Negative.  Negative for chest pain and leg swelling.  Gastrointestinal: Negative.  Negative for abdominal pain, blood in stool and melena.  Genitourinary: Negative.  Negative for hematuria.  Musculoskeletal: Negative.  Negative for back pain.  Skin: Negative.  Negative for rash.  Neurological: Negative.  Negative for dizziness, focal weakness, weakness and headaches.  Psychiatric/Behavioral: Negative.  The patient is not nervous/anxious.     As per HPI. Otherwise, a complete review of systems is negative.  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[place])    Morbid  obesity ([place])    Sleep apnea     PAST SURGICAL HISTORY: Past Surgical History:  Procedure Laterality Date   COLONOSCOPY WITH PROPOFOL N/A 07/13/2020   Procedure: COLONOSCOPY WITH PROPOFOL;  Surgeon: Lesly Rubenstein, MD;  Location: [facility] ENDOSCOPY;  Service: Endoscopy;  Laterality: N/A;   COLONOSCOPY WITH PROPOFOL N/A 12/23/2020   Procedure: COLONOSCOPY WITH PROPOFOL;  Surgeon: Lucilla Lame, MD;  Location: [facility] ENDOSCOPY;  Service: Endoscopy;  Laterality: N/A;   ESOPHAGOGASTRODUODENOSCOPY (EGD) WITH PROPOFOL N/A 12/23/2020   Procedure: ESOPHAGOGASTRODUODENOSCOPY (EGD) WITH PROPOFOL;  Surgeon: Lucilla Lame, MD;  Location: [facility] ENDOSCOPY;  Service: Endoscopy;  Laterality: N/A;   GIVENS CAPSULE STUDY N/A 12/29/2020   Procedure: GIVENS CAPSULE STUDY;  Surgeon: Lin Landsman, MD;  Location: [facility] ENDOSCOPY;  Service: Gastroenterology;  Laterality: N/A;   TOOTH EXTRACTION      FAMILY HISTORY: Family History  Problem Relation Age of Onset   Benign prostatic hyperplasia Father    Psoriasis Father    Hypertension Mother    Hyperlipidemia Mother    Diabetes Maternal Grandmother    Diabetes Paternal Grandmother     ADVANCED DIRECTIVES (Y/N):  N  HEALTH MAINTENANCE: Social History   Tobacco Use   Smoking status: Never   Smokeless tobacco: Never  Vaping Use   Vaping Use: Never used  Substance Use Topics   Alcohol use: No   Drug use: No     Colonoscopy:  PAP:  Bone density:  Lipid panel:  Allergies  Allergen Reactions   Ampicillin Diarrhea   Bactrim [Sulfamethoxazole-Trimethoprim] Hives   Cephalexin    Claritin [  Loratadine] Other (See Comments)    Irritates throat   Penicillins Rash    Current Outpatient Medications  Medication Sig Dispense Refill   acarbose (PRECOSE) 25 MG tablet Take 25 mg by mouth 3 (three) times daily with meals.     amLODipine (NORVASC) 10 MG tablet Take 10 mg by mouth daily.     fenofibrate 160 MG tablet Take 160 mg by mouth daily.      fexofenadine (ALLEGRA) 180 MG tablet Take 180 mg by mouth daily.     fluticasone (FLONASE) 50 MCG/ACT nasal spray Place 2 sprays into both nostrils daily as needed.     glimepiride (AMARYL) 4 MG tablet Take 4 mg by mouth daily with breakfast.     HIBICLENS 4 % external liquid APPLY TOPICALLY DAILY AS NEEDED. TO WASHLEGS AND GROIN AREA 120 mL 3   Hydrocortisone-Aloe 1 % CREA Apply 1 application topically 2 (two) times daily as needed (itching). To itchy area on legs     ketoconazole (NIZORAL) 2 % cream Apply to rash in groin once a day as needed for flares. 60 g 11   latanoprost (XALATAN) 0.005 % ophthalmic solution Apply to eye.     lidocaine (LMX) 4 % cream Apply 1 application topically as needed.     lovastatin (MEVACOR) 10 MG tablet Take 10 mg by mouth daily.     metFORMIN (GLUCOPHAGE-XR) 500 MG 24 hr tablet Take 500-1,000 mg by mouth 2 (two) times daily. Takes 1 tablet with breakfast and 2 tablets with supper     metoprolol tartrate (LOPRESSOR) 50 MG tablet Take 50 mg by mouth 2 (two) times daily.     Multiple Vitamins-Minerals (MULTIVITAMIN WITH MINERALS) tablet Take 1 tablet by mouth daily.     mupirocin ointment (BACTROBAN) 2 % Apply to boils and ulcers QD PRN flares. 22 g 3   omeprazole (PRILOSEC) 20 MG capsule Take 20 mg by mouth daily.     pioglitazone (ACTOS) 30 MG tablet Take 30 mg by mouth daily.     ramipril (ALTACE) 10 MG capsule Take 10 mg by mouth 2 (two) times daily.     spironolactone (ALDACTONE) 50 MG tablet Take 50 mg by mouth daily.     terazosin (HYTRIN) 10 MG capsule Take 10 mg by mouth daily.      torsemide (DEMADEX) 20 MG tablet Take 20 mg by mouth daily.     traZODone (DESYREL) 150 MG tablet Take 150 mg by mouth at bedtime.     TRESIBA FLEXTOUCH 200 UNIT/ML FlexTouch Pen Inject 32 Units into the skin in the morning.     vitamin B-12 (CYANOCOBALAMIN) 1000 MCG tablet Take 1,000 mcg by mouth daily.     FARXIGA 10 MG TABS tablet Take 10 mg by mouth daily. (Patient not  taking: Reported on 05/04/2021)     ferrous sulfate 325 (65 FE) MG EC tablet Take 1 tablet (325 mg total) by mouth 2 (two) times daily. (Patient not taking: Reported on 05/04/2021) 60 tablet 3   trolamine salicylate (ASPERCREME) 10 % cream Apply 1 application topically as needed for muscle pain. (Patient not taking: Reported on 08/24/2021)     No current facility-administered medications for this visit.    OBJECTIVE: Vitals:   09/21/21 0928  BP: (!) 131/48  Pulse: 72  Resp: 16  Temp: (!) 97.1 F (36.2 C)  SpO2: 99%      Body mass index is 51.22 kg/m.    ECOG FS:0 - Asymptomatic  General: Well-developed, well-nourished,   no acute distress. Eyes: Pink conjunctiva, anicteric sclera. HEENT: Normocephalic, moist mucous membranes. Lungs: No audible wheezing or coughing. Heart: Regular rate and rhythm. Abdomen: Soft, nontender, no obvious distention. Musculoskeletal: No edema, cyanosis, or clubbing. Neuro: Alert, answering all questions appropriately. Cranial nerves grossly intact. Skin: No rashes or petechiae noted. Psych: Normal affect.  LAB RESULTS:  Lab Results  Component Value Date   NA 136 01/20/2021   K 3.3 (L) 01/20/2021   CL 99 01/20/2021   CO2 26 01/20/2021   GLUCOSE 106 (H) 01/20/2021   BUN 41 (H) 01/20/2021   CREATININE 1.12 01/20/2021   CALCIUM 8.8 (L) 01/20/2021   PROT 6.8 01/20/2021   ALBUMIN 3.4 (L) 01/20/2021   AST 14 (L) 01/20/2021   ALT 13 01/20/2021   ALKPHOS 36 (L) 01/20/2021   BILITOT 1.0 01/20/2021   GFRNONAA >60 01/20/2021   GFRAA >60 10/25/2016    Lab Results  Component Value Date   WBC 4.5 09/21/2021   NEUTROABS 3.8 09/21/2021   HGB 10.8 (L) 09/21/2021   HCT 31.5 (L) 09/21/2021   MCV 99.4 09/21/2021   PLT 287 09/21/2021     STUDIES: US SPLEEN (ABDOMEN LIMITED)  Result Date: 08/31/2021 CLINICAL DATA:  Idiopathic cytopenia of undetermined significance. Evaluate for splenomegaly. EXAM: ULTRASOUND ABDOMEN LIMITED COMPARISON:  None  Available. FINDINGS: The spleen measures 15.3 by 16.7 x 8.6 cm with a volume of 1157 cc. No other abnormalities identified. IMPRESSION: Splenomegaly. Electronically Signed   By: Dorise Bullion III M.D.   On: 08/31/2021 09:31    ASSESSMENT:Idiopathic cytopenia of undetermined significance.  PLAN:    Idiopathic cytopenia of undetermined significance: Bone marrow biopsy on January 03, 2021 revealed a hypercellular marrow with erythroid hyperplasia and dyserythropoiesis.  Patient initially thought to have MDS, but second opinion from [facility] did not feel his bone marrow supported this diagnosis.  No blasts, increased plasma cells, or clonality was noted.  Cytogenetics were reported as normal.  Recommendation is to treat with Aranesp 500 mcg every 2 weeks.  Patient continues to require transfusions, but less frequently.  Hemoglobin is 10.8 today, therefore we will proceed with Aranesp as scheduled.  He does not require blood transfusion. Continue treatment with Aranesp if his hemoglobin is below 11.0.  Will attempt to minimize blood transfusions.  Once patient's hemoglobin is greater than 11, can consider decreasing frequency of Aranesp injections to every 3 weeks.  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  Return to clinic in 2, 4, and 6 weeks for laboratory work and consideration of Aranesp if his hemoglobin is less than 11.0.  Patient will then return to clinic in 8 weeks for further evaluation and continuation of treatment if needed.  Premedications for transfusion include Benadryl, Tylenol, and Solu-Medrol.  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  Recent and repeat IgA levels were undetectable with a normal IgM and IgG.  Patient also has anti-IgA antibodies.  Patients with isolated IgA deficiency can have anaphylactoid reactions to blood transfusions.   Shortness of breath: Patient does not  complain of this today.  Follow-up with pulmonary as indicated. Positive ANA: Patient noted to have a positive and ANA at his work-up at [facility].  The clinical significance of this is unclear. Leukopenia: Resolved. Splenomegaly: Unclear etiology.  Patient reports he has repeat imaging at [facility] next week.   Patient expressed understanding and was in agreement with this plan.  He also understands that He can call clinic at any time with any questions, concerns, or complaints.    Lloyd Huger, MD   09/23/2021 9:24 AM

## 2021-09-21 ENCOUNTER — Inpatient Hospital Stay: Payer: Medicare HMO | Attending: Oncology

## 2021-09-21 ENCOUNTER — Encounter: Payer: Self-pay | Admitting: Oncology

## 2021-09-21 ENCOUNTER — Inpatient Hospital Stay: Payer: Medicare HMO

## 2021-09-21 ENCOUNTER — Inpatient Hospital Stay: Payer: Medicare HMO | Admitting: Oncology

## 2021-09-21 VITALS — BP 131/48 | HR 72 | Temp 97.1°F | Resp 16 | Ht 67.0 in | Wt 327.0 lb

## 2021-09-21 DIAGNOSIS — D802 Selective deficiency of immunoglobulin A [IgA]: Secondary | ICD-10-CM | POA: Diagnosis present

## 2021-09-21 DIAGNOSIS — D759 Disease of blood and blood-forming organs, unspecified: Secondary | ICD-10-CM | POA: Diagnosis not present

## 2021-09-21 DIAGNOSIS — D469 Myelodysplastic syndrome, unspecified: Secondary | ICD-10-CM

## 2021-09-21 LAB — CBC WITH DIFFERENTIAL/PLATELET
Abs Immature Granulocytes: 0.05 10*3/uL (ref 0.00–0.07)
Basophils Absolute: 0 10*3/uL (ref 0.0–0.1)
Basophils Relative: 0 %
Eosinophils Absolute: 0 10*3/uL (ref 0.0–0.5)
Eosinophils Relative: 0 %
HCT: 31.5 % — ABNORMAL LOW (ref 39.0–52.0)
Hemoglobin: 10.8 g/dL — ABNORMAL LOW (ref 13.0–17.0)
Immature Granulocytes: 1 %
Lymphocytes Relative: 4 %
Lymphs Abs: 0.2 10*3/uL — ABNORMAL LOW (ref 0.7–4.0)
MCH: 34.1 pg — ABNORMAL HIGH (ref 26.0–34.0)
MCHC: 34.3 g/dL (ref 30.0–36.0)
MCV: 99.4 fL (ref 80.0–100.0)
Monocytes Absolute: 0.4 10*3/uL (ref 0.1–1.0)
Monocytes Relative: 10 %
Neutro Abs: 3.8 10*3/uL (ref 1.7–7.7)
Neutrophils Relative %: 85 %
Platelets: 287 10*3/uL (ref 150–400)
RBC: 3.17 MIL/uL — ABNORMAL LOW (ref 4.22–5.81)
RDW: 13.9 % (ref 11.5–15.5)
WBC: 4.5 10*3/uL (ref 4.0–10.5)
nRBC: 0 % (ref 0.0–0.2)

## 2021-09-21 LAB — SAMPLE TO BLOOD BANK

## 2021-09-21 MED ORDER — DARBEPOETIN ALFA 500 MCG/ML IJ SOSY
500.0000 ug | PREFILLED_SYRINGE | Freq: Once | INTRAMUSCULAR | Status: AC
  Administered 2021-09-21: 500 ug via SUBCUTANEOUS
  Filled 2021-09-21: qty 1

## 2021-09-23 ENCOUNTER — Encounter: Payer: Self-pay | Admitting: Oncology

## 2021-10-04 ENCOUNTER — Encounter: Payer: Self-pay | Admitting: Oncology

## 2021-10-05 ENCOUNTER — Inpatient Hospital Stay: Payer: Medicare HMO

## 2021-10-05 ENCOUNTER — Telehealth: Payer: Self-pay

## 2021-10-05 VITALS — BP 160/69 | HR 74

## 2021-10-05 DIAGNOSIS — D802 Selective deficiency of immunoglobulin A [IgA]: Secondary | ICD-10-CM | POA: Diagnosis not present

## 2021-10-05 DIAGNOSIS — D469 Myelodysplastic syndrome, unspecified: Secondary | ICD-10-CM

## 2021-10-05 DIAGNOSIS — D759 Disease of blood and blood-forming organs, unspecified: Secondary | ICD-10-CM

## 2021-10-05 LAB — CBC WITH DIFFERENTIAL/PLATELET
Abs Immature Granulocytes: 0.02 10*3/uL (ref 0.00–0.07)
Basophils Absolute: 0 10*3/uL (ref 0.0–0.1)
Basophils Relative: 0 %
Eosinophils Absolute: 0 10*3/uL (ref 0.0–0.5)
Eosinophils Relative: 0 %
HCT: 29.6 % — ABNORMAL LOW (ref 39.0–52.0)
Hemoglobin: 10.3 g/dL — ABNORMAL LOW (ref 13.0–17.0)
Immature Granulocytes: 1 %
Lymphocytes Relative: 6 %
Lymphs Abs: 0.2 10*3/uL — ABNORMAL LOW (ref 0.7–4.0)
MCH: 34 pg (ref 26.0–34.0)
MCHC: 34.8 g/dL (ref 30.0–36.0)
MCV: 97.7 fL (ref 80.0–100.0)
Monocytes Absolute: 0.4 10*3/uL (ref 0.1–1.0)
Monocytes Relative: 11 %
Neutro Abs: 2.7 10*3/uL (ref 1.7–7.7)
Neutrophils Relative %: 82 %
Platelets: 242 10*3/uL (ref 150–400)
RBC: 3.03 MIL/uL — ABNORMAL LOW (ref 4.22–5.81)
RDW: 13.8 % (ref 11.5–15.5)
WBC: 3.3 10*3/uL — ABNORMAL LOW (ref 4.0–10.5)
nRBC: 0 % (ref 0.0–0.2)

## 2021-10-05 LAB — SAMPLE TO BLOOD BANK

## 2021-10-05 MED ORDER — DARBEPOETIN ALFA 500 MCG/ML IJ SOSY
500.0000 ug | PREFILLED_SYRINGE | Freq: Once | INTRAMUSCULAR | Status: AC
  Administered 2021-10-05: 500 ug via SUBCUTANEOUS
  Filled 2021-10-05: qty 1

## 2021-10-05 NOTE — Telephone Encounter (Addendum)
Patient sent a mychart message requesting a phone call. Returned patients call. He wanted to see if Dr. Grayland Ormond could tell him the results of his recent US liver doppler done on 9/12 ordered by Dr. Lucianne Lei. I advised patient to call Dr. Lucianne Lei to see if he could get results from him. I also let patient know that Dr. Grayland Ormond would not be back in the office until Thursday. He wanted to know if Dr. Lucianne Lei communicated with Dr. Grayland Ormond about any concerns with scan. Patient also wanted to know if it is ok to get the flu and booster vaccine while taking his Arenesp shot.

## 2021-10-06 NOTE — Telephone Encounter (Signed)
Sent mychart message to patient letting him know ok to get shots.

## 2021-10-19 ENCOUNTER — Telehealth: Payer: Self-pay

## 2021-10-19 ENCOUNTER — Inpatient Hospital Stay: Payer: Medicare HMO

## 2021-10-19 ENCOUNTER — Inpatient Hospital Stay: Payer: Medicare HMO | Attending: Oncology

## 2021-10-19 VITALS — BP 134/65 | HR 70

## 2021-10-19 DIAGNOSIS — D759 Disease of blood and blood-forming organs, unspecified: Secondary | ICD-10-CM

## 2021-10-19 DIAGNOSIS — D802 Selective deficiency of immunoglobulin A [IgA]: Secondary | ICD-10-CM | POA: Diagnosis present

## 2021-10-19 DIAGNOSIS — D469 Myelodysplastic syndrome, unspecified: Secondary | ICD-10-CM

## 2021-10-19 LAB — CBC WITH DIFFERENTIAL/PLATELET
Abs Immature Granulocytes: 0.03 10*3/uL (ref 0.00–0.07)
Basophils Absolute: 0 10*3/uL (ref 0.0–0.1)
Basophils Relative: 0 %
Eosinophils Absolute: 0 10*3/uL (ref 0.0–0.5)
Eosinophils Relative: 0 %
HCT: 29.9 % — ABNORMAL LOW (ref 39.0–52.0)
Hemoglobin: 10.6 g/dL — ABNORMAL LOW (ref 13.0–17.0)
Immature Granulocytes: 1 %
Lymphocytes Relative: 5 %
Lymphs Abs: 0.2 10*3/uL — ABNORMAL LOW (ref 0.7–4.0)
MCH: 34.1 pg — ABNORMAL HIGH (ref 26.0–34.0)
MCHC: 35.5 g/dL (ref 30.0–36.0)
MCV: 96.1 fL (ref 80.0–100.0)
Monocytes Absolute: 0.4 10*3/uL (ref 0.1–1.0)
Monocytes Relative: 9 %
Neutro Abs: 3.7 10*3/uL (ref 1.7–7.7)
Neutrophils Relative %: 85 %
Platelets: 235 10*3/uL (ref 150–400)
RBC: 3.11 MIL/uL — ABNORMAL LOW (ref 4.22–5.81)
RDW: 13.8 % (ref 11.5–15.5)
WBC: 4.3 10*3/uL (ref 4.0–10.5)
nRBC: 0 % (ref 0.0–0.2)

## 2021-10-19 LAB — SAMPLE TO BLOOD BANK

## 2021-10-19 MED ORDER — DARBEPOETIN ALFA 500 MCG/ML IJ SOSY
500.0000 ug | PREFILLED_SYRINGE | Freq: Once | INTRAMUSCULAR | Status: AC
  Administered 2021-10-19: 500 ug via SUBCUTANEOUS
  Filled 2021-10-19: qty 1

## 2021-10-19 NOTE — Telephone Encounter (Signed)
Patient wanted you to know that he has an appointment with the Liver Specialist at [facility] Dr. Ferd Hibbs on 10/17.

## 2021-11-02 ENCOUNTER — Inpatient Hospital Stay: Payer: Medicare HMO

## 2021-11-02 VITALS — BP 152/72 | HR 80

## 2021-11-02 DIAGNOSIS — D759 Disease of blood and blood-forming organs, unspecified: Secondary | ICD-10-CM

## 2021-11-02 DIAGNOSIS — D802 Selective deficiency of immunoglobulin A [IgA]: Secondary | ICD-10-CM | POA: Diagnosis not present

## 2021-11-02 MED ORDER — DARBEPOETIN ALFA 500 MCG/ML IJ SOSY
500.0000 ug | PREFILLED_SYRINGE | Freq: Once | INTRAMUSCULAR | Status: AC
  Administered 2021-11-02: 500 ug via SUBCUTANEOUS
  Filled 2021-11-02: qty 1

## 2021-11-16 ENCOUNTER — Inpatient Hospital Stay: Payer: Medicare HMO | Attending: Oncology

## 2021-11-16 ENCOUNTER — Telehealth: Payer: Self-pay

## 2021-11-16 ENCOUNTER — Encounter: Payer: Self-pay | Admitting: Oncology

## 2021-11-16 ENCOUNTER — Inpatient Hospital Stay: Payer: Medicare HMO | Admitting: Oncology

## 2021-11-16 ENCOUNTER — Inpatient Hospital Stay: Payer: Medicare HMO

## 2021-11-16 VITALS — BP 161/51 | HR 70 | Temp 96.7°F | Resp 22 | Wt 330.6 lb

## 2021-11-16 DIAGNOSIS — D469 Myelodysplastic syndrome, unspecified: Secondary | ICD-10-CM

## 2021-11-16 DIAGNOSIS — D759 Disease of blood and blood-forming organs, unspecified: Secondary | ICD-10-CM

## 2021-11-16 DIAGNOSIS — R161 Splenomegaly, not elsewhere classified: Secondary | ICD-10-CM | POA: Insufficient documentation

## 2021-11-16 DIAGNOSIS — D802 Selective deficiency of immunoglobulin A [IgA]: Secondary | ICD-10-CM | POA: Diagnosis not present

## 2021-11-16 DIAGNOSIS — D649 Anemia, unspecified: Secondary | ICD-10-CM | POA: Diagnosis not present

## 2021-11-16 LAB — CBC WITH DIFFERENTIAL/PLATELET
Abs Immature Granulocytes: 0.05 10*3/uL (ref 0.00–0.07)
Basophils Absolute: 0 10*3/uL (ref 0.0–0.1)
Basophils Relative: 0 %
Eosinophils Absolute: 0 10*3/uL (ref 0.0–0.5)
Eosinophils Relative: 0 %
HCT: 30.1 % — ABNORMAL LOW (ref 39.0–52.0)
Hemoglobin: 10.3 g/dL — ABNORMAL LOW (ref 13.0–17.0)
Immature Granulocytes: 1 %
Lymphocytes Relative: 6 %
Lymphs Abs: 0.2 10*3/uL — ABNORMAL LOW (ref 0.7–4.0)
MCH: 33.6 pg (ref 26.0–34.0)
MCHC: 34.2 g/dL (ref 30.0–36.0)
MCV: 98 fL (ref 80.0–100.0)
Monocytes Absolute: 0.3 10*3/uL (ref 0.1–1.0)
Monocytes Relative: 8 %
Neutro Abs: 3.1 10*3/uL (ref 1.7–7.7)
Neutrophils Relative %: 85 %
Platelets: 233 10*3/uL (ref 150–400)
RBC: 3.07 MIL/uL — ABNORMAL LOW (ref 4.22–5.81)
RDW: 13.9 % (ref 11.5–15.5)
WBC: 3.6 10*3/uL — ABNORMAL LOW (ref 4.0–10.5)
nRBC: 0 % (ref 0.0–0.2)

## 2021-11-16 LAB — SAMPLE TO BLOOD BANK

## 2021-11-16 MED ORDER — DARBEPOETIN ALFA 500 MCG/ML IJ SOSY
500.0000 ug | PREFILLED_SYRINGE | Freq: Once | INTRAMUSCULAR | Status: AC
  Administered 2021-11-16: 500 ug via SUBCUTANEOUS
  Filled 2021-11-16: qty 1

## 2021-11-16 NOTE — Progress Notes (Signed)
[place]  Telephone:(336) [phone] Fax:(336) [phone]  ID: Patrick Duncan OB: 22-Dec-1965  MR#: [id number]  BUL#:[id number]  Patient Care Team: Sofie Hartigan, MD as PCP - General (Family Medicine) Lloyd Huger, MD as Consulting Physician (Oncology)  CHIEF COMPLAINT: Idiopathic cytopenia of undetermined significance.    INTERVAL HISTORY: Patient returns to clinic today for repeat laboratory work, further evaluation, and continuation of Aranesp.  He continues to feel well.  He does not complain of any weakness or fatigue today. He has no neurologic complaints.  He denies any recent fevers.  He has a fair appetite, but denies weight loss.  He has no chest pain, or hemoptysis.  He denies any nausea, vomiting, constipation, or diarrhea.  He has no further melena or hematochezia.  He has no urinary complaints.  [name]  REVIEW OF SYSTEMS:   Review of Systems  Constitutional: Negative.  Negative for fever, malaise/fatigue and weight loss.  Respiratory: Negative.  Negative for cough, hemoptysis and shortness of breath.   Cardiovascular: Negative.  Negative for chest pain and leg swelling.  Gastrointestinal: Negative.  Negative for abdominal pain, blood in stool and melena.  Genitourinary: Negative.  Negative for hematuria.  Musculoskeletal: Negative.  Negative for back pain.  Skin: Negative.  Negative for rash.  Neurological: Negative.  Negative for dizziness, focal weakness, weakness and headaches.  Psychiatric/Behavioral: Negative.  The patient is not nervous/anxious.     As per HPI. Otherwise, a complete review of systems is negative.  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[place])     Morbid obesity ([place])    Sleep apnea     PAST SURGICAL HISTORY: Past Surgical History:  Procedure Laterality Date   COLONOSCOPY WITH PROPOFOL N/A 07/13/2020   Procedure: COLONOSCOPY WITH PROPOFOL;  Surgeon: Lesly Rubenstein, MD;  Location: [facility] ENDOSCOPY;  Service: Endoscopy;  Laterality: N/A;   COLONOSCOPY WITH PROPOFOL N/A 12/23/2020   Procedure: COLONOSCOPY WITH PROPOFOL;  Surgeon: Lucilla Lame, MD;  Location[facility] ENDOSCOPY;  Service: Endoscopy;  Laterality: N/A;   ESOPHAGOGASTRODUODENOSCOPY (EGD) WITH PROPOFOL N/A 12/23/2020   Procedure: ESOPHAGOGASTRODUODENOSCOPY (EGD) WITH PROPOFOL;  Surgeon: Lucilla Lame, MD;  Location: [facility] ENDOSCOPY;  Service: Endoscopy;  Laterality: N/A;   GIVENS CAPSULE STUDY N/A 12/29/2020   Procedure: GIVENS CAPSULE STUDY;  Surgeon: Lin Landsman, MD;  Location: [facility] ENDOSCOPY;  Service: Gastroenterology;  Laterality: N/A;   TOOTH EXTRACTION      FAMILY HISTORY: Family History  Problem Relation Age of Onset   Benign prostatic hyperplasia Father    Psoriasis Father    Hypertension Mother    Hyperlipidemia Mother    Diabetes Maternal Grandmother    Diabetes Paternal Grandmother     ADVANCED DIRECTIVES (Y/N):  N  HEALTH MAINTENANCE: Social History   Tobacco Use   Smoking status: Never   Smokeless tobacco: Never  Vaping Use   Vaping Use: Never used  Substance Use Topics   Alcohol use: No   Drug use: No     Colonoscopy:  PAP:  Bone density:  Lipid panel:  Allergies  Allergen Reactions   Ampicillin Diarrhea   Bactrim [Sulfamethoxazole-Trimethoprim] Hives   Cephalexin  Claritin [Loratadine] Other (See Comments)    Irritates throat   Penicillins Rash    Current Outpatient Medications  Medication Sig Dispense Refill   acarbose (PRECOSE) 25 MG tablet Take 25 mg by mouth 3 (three) times daily with meals.     amLODipine (NORVASC) 10 MG tablet Take 10 mg by mouth daily.     fenofibrate 160 MG tablet Take 160 mg by mouth daily.      fexofenadine (ALLEGRA) 180 MG tablet Take 180 mg by mouth daily.     fluticasone (FLONASE) 50 MCG/ACT nasal spray Place 2 sprays into both nostrils daily as needed.     glimepiride (AMARYL) 4 MG tablet Take 4 mg by mouth daily with breakfast.     HIBICLENS 4 % external liquid APPLY TOPICALLY DAILY AS NEEDED. TO WASHLEGS AND GROIN AREA 120 mL 3   latanoprost (XALATAN) 0.005 % ophthalmic solution Apply to eye.     lidocaine (LMX) 4 % cream Apply 1 application topically as needed.     lovastatin (MEVACOR) 10 MG tablet Take 10 mg by mouth daily.     metFORMIN (GLUCOPHAGE-XR) 500 MG 24 hr tablet Take 500-1,000 mg by mouth 2 (two) times daily. Takes 1 tablet with breakfast and 2 tablets with supper     metoprolol tartrate (LOPRESSOR) 50 MG tablet Take 50 mg by mouth 2 (two) times daily.     Multiple Vitamins-Minerals (MULTIVITAMIN WITH MINERALS) tablet Take 1 tablet by mouth daily.     mupirocin ointment (BACTROBAN) 2 % Apply to boils and ulcers QD PRN flares. 22 g 3   omeprazole (PRILOSEC) 20 MG capsule Take 20 mg by mouth daily.     pioglitazone (ACTOS) 30 MG tablet Take 30 mg by mouth daily.     ramipril (ALTACE) 10 MG capsule Take 10 mg by mouth 2 (two) times daily.     spironolactone (ALDACTONE) 50 MG tablet Take 50 mg by mouth daily.     terazosin (HYTRIN) 10 MG capsule Take 10 mg by mouth daily.      torsemide (DEMADEX) 20 MG tablet Take 20 mg by mouth daily.     traZODone (DESYREL) 150 MG tablet Take 150 mg by mouth at bedtime.     TRESIBA FLEXTOUCH 200 UNIT/ML FlexTouch Pen Inject 48 Units into the skin in the morning.     vitamin B-12 (CYANOCOBALAMIN) 1000 MCG tablet Take 1,000 mcg by mouth daily.     Hydrocortisone-Aloe 1 % CREA Apply 1 application topically 2 (two) times daily as needed (itching). To itchy area on legs (Patient not taking: Reported on 11/16/2021)     ketoconazole (NIZORAL) 2 % cream Apply to rash in groin once a day as needed for flares. (Patient not taking: Reported on  11/16/2021) 60 g 11   trolamine salicylate (ASPERCREME) 10 % cream Apply 1 application topically as needed for muscle pain. (Patient not taking: Reported on 11/16/2021)     No current facility-administered medications for this visit.    OBJECTIVE: Vitals:   11/16/21 0919  BP: (!) 161/51  Pulse: 70  Resp: (!) 22  Temp: (!) 96.7 F (35.9 C)  SpO2: 98%      Body mass index is 51.78 kg/m.    ECOG FS:0 - Asymptomatic  General: Well-developed, well-nourished, no acute distress. Eyes: Pink conjunctiva, anicteric sclera. HEENT: Normocephalic, moist mucous membranes. Lungs: No audible wheezing or coughing. Heart: Regular rate and rhythm. Abdomen: Soft, nontender, no obvious distention. Musculoskeletal: No edema, cyanosis, or clubbing. Neuro: Alert,  answering all questions appropriately. Cranial nerves grossly intact. Skin: No rashes or petechiae noted. Psych: Normal affect.  LAB RESULTS:  Lab Results  Component Value Date   NA 136 01/20/2021   K 3.3 (L) 01/20/2021   CL 99 01/20/2021   CO2 26 01/20/2021   GLUCOSE 106 (H) 01/20/2021   BUN 41 (H) 01/20/2021   CREATININE 1.12 01/20/2021   CALCIUM 8.8 (L) 01/20/2021   PROT 6.8 01/20/2021   ALBUMIN 3.4 (L) 01/20/2021   AST 14 (L) 01/20/2021   ALT 13 01/20/2021   ALKPHOS 36 (L) 01/20/2021   BILITOT 1.0 01/20/2021   GFRNONAA >60 01/20/2021   GFRAA >60 10/25/2016    Lab Results  Component Value Date   WBC 3.6 (L) 11/16/2021   NEUTROABS 3.1 11/16/2021   HGB 10.3 (L) 11/16/2021   HCT 30.1 (L) 11/16/2021   MCV 98.0 11/16/2021   PLT 233 11/16/2021     STUDIES: No results found.  ASSESSMENT:Idiopathic cytopenia of undetermined significance.  PLAN:    Idiopathic cytopenia of undetermined significance: Bone marrow biopsy on January 03, 2021 revealed a hypercellular marrow with erythroid hyperplasia and dyserythropoiesis.  Patient initially thought to have MDS, but second opinion from [facility] did not feel his bone marrow  supported this diagnosis.  No blasts, increased plasma cells, or clonality was noted.  Cytogenetics were reported as normal.  Recommendation is to treat with Aranesp 500 mcg every 2 weeks.  Patient has not required a blood transfusion since June 17, 2021.  Patient's hemoglobin is below 11.0, therefore will proceed with Aranesp as scheduled.  Once patient's hemoglobin is greater than 11, can consider decreasing frequency of Aranesp injections to every 3 weeks.  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  Return to clinic in 2, 4, and 6 weeks for laboratory work and consideration of Aranesp if his hemoglobin is less than 11.0.  Patient will then return to clinic in 8 weeks for further evaluation and continuation of treatment if needed.  Premedications for transfusion include Benadryl, Tylenol, and Solu-Medrol.  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  Recent and repeat IgA levels were undetectable with a normal IgM and IgG.  Patient also has anti-IgA antibodies.  Patients with isolated IgA deficiency can have anaphylactoid reactions to blood transfusions.   Shortness of breath: Patient does not complain of this today.  Follow-up with pulmonary as indicated. Positive ANA: Patient noted to have a positive and ANA at his work-up at [facility].  The clinical significance of this is unclear. Leukopenia: Resolved. Splenomegaly: Unclear etiology.  Patient reports he has repeat imaging at [facility] next week. NASH: Patient reports he is being evaluated at [place] clinic.   Patient expressed understanding and was in agreement with this plan. He also understands that He can call clinic at any time with any questions, concerns, or complaints.    Lloyd Huger, MD   11/16/2021 2:47 PM

## 2021-11-16 NOTE — Progress Notes (Signed)
Pt feeling at his baseline. No blood noted. No fevers. No hot flashes. Dyspnea improving.

## 2021-11-16 NOTE — Telephone Encounter (Signed)
Spoke with patient in regards to [employer] wanting a call from me. Patient wanted all of his appointments scheduled earlier. Abbie changed all appointments per patients request.

## 2021-11-30 ENCOUNTER — Ambulatory Visit: Payer: Medicare HMO

## 2021-11-30 ENCOUNTER — Inpatient Hospital Stay: Payer: Medicare HMO

## 2021-11-30 ENCOUNTER — Other Ambulatory Visit: Payer: Medicare HMO

## 2021-11-30 VITALS — BP 152/68 | HR 76 | Temp 97.8°F

## 2021-11-30 DIAGNOSIS — D802 Selective deficiency of immunoglobulin A [IgA]: Secondary | ICD-10-CM | POA: Diagnosis not present

## 2021-11-30 DIAGNOSIS — D649 Anemia, unspecified: Secondary | ICD-10-CM

## 2021-11-30 DIAGNOSIS — D759 Disease of blood and blood-forming organs, unspecified: Secondary | ICD-10-CM

## 2021-11-30 LAB — CBC WITH DIFFERENTIAL/PLATELET
Abs Immature Granulocytes: 0.06 10*3/uL (ref 0.00–0.07)
Basophils Absolute: 0 10*3/uL (ref 0.0–0.1)
Basophils Relative: 1 %
Eosinophils Absolute: 0 10*3/uL (ref 0.0–0.5)
Eosinophils Relative: 0 %
HCT: 30.1 % — ABNORMAL LOW (ref 39.0–52.0)
Hemoglobin: 10.2 g/dL — ABNORMAL LOW (ref 13.0–17.0)
Immature Granulocytes: 2 %
Lymphocytes Relative: 6 %
Lymphs Abs: 0.3 10*3/uL — ABNORMAL LOW (ref 0.7–4.0)
MCH: 33 pg (ref 26.0–34.0)
MCHC: 33.9 g/dL (ref 30.0–36.0)
MCV: 97.4 fL (ref 80.0–100.0)
Monocytes Absolute: 0.4 10*3/uL (ref 0.1–1.0)
Monocytes Relative: 10 %
Neutro Abs: 3.2 10*3/uL (ref 1.7–7.7)
Neutrophils Relative %: 81 %
Platelets: 263 10*3/uL (ref 150–400)
RBC: 3.09 MIL/uL — ABNORMAL LOW (ref 4.22–5.81)
RDW: 14.2 % (ref 11.5–15.5)
WBC: 4 10*3/uL (ref 4.0–10.5)
nRBC: 0 % (ref 0.0–0.2)

## 2021-11-30 LAB — SAMPLE TO BLOOD BANK

## 2021-11-30 MED ORDER — DARBEPOETIN ALFA 500 MCG/ML IJ SOSY
500.0000 ug | PREFILLED_SYRINGE | Freq: Once | INTRAMUSCULAR | Status: AC
  Administered 2021-11-30: 500 ug via SUBCUTANEOUS
  Filled 2021-11-30: qty 1

## 2021-12-14 ENCOUNTER — Inpatient Hospital Stay: Payer: Medicare HMO

## 2021-12-14 ENCOUNTER — Ambulatory Visit: Payer: Medicare HMO

## 2021-12-14 ENCOUNTER — Other Ambulatory Visit: Payer: Medicare HMO

## 2021-12-14 VITALS — BP 150/45 | HR 68

## 2021-12-14 DIAGNOSIS — D802 Selective deficiency of immunoglobulin A [IgA]: Secondary | ICD-10-CM | POA: Diagnosis not present

## 2021-12-14 DIAGNOSIS — D759 Disease of blood and blood-forming organs, unspecified: Secondary | ICD-10-CM

## 2021-12-14 DIAGNOSIS — D649 Anemia, unspecified: Secondary | ICD-10-CM

## 2021-12-14 LAB — CBC WITH DIFFERENTIAL/PLATELET
Abs Immature Granulocytes: 0.06 10*3/uL (ref 0.00–0.07)
Basophils Absolute: 0 10*3/uL (ref 0.0–0.1)
Basophils Relative: 0 %
Eosinophils Absolute: 0 10*3/uL (ref 0.0–0.5)
Eosinophils Relative: 0 %
HCT: 31.7 % — ABNORMAL LOW (ref 39.0–52.0)
Hemoglobin: 10.7 g/dL — ABNORMAL LOW (ref 13.0–17.0)
Immature Granulocytes: 2 %
Lymphocytes Relative: 5 %
Lymphs Abs: 0.2 10*3/uL — ABNORMAL LOW (ref 0.7–4.0)
MCH: 33 pg (ref 26.0–34.0)
MCHC: 33.8 g/dL (ref 30.0–36.0)
MCV: 97.8 fL (ref 80.0–100.0)
Monocytes Absolute: 0.4 10*3/uL (ref 0.1–1.0)
Monocytes Relative: 9 %
Neutro Abs: 3.4 10*3/uL (ref 1.7–7.7)
Neutrophils Relative %: 84 %
Platelets: 261 10*3/uL (ref 150–400)
RBC: 3.24 MIL/uL — ABNORMAL LOW (ref 4.22–5.81)
RDW: 14.5 % (ref 11.5–15.5)
WBC: 4 10*3/uL (ref 4.0–10.5)
nRBC: 0 % (ref 0.0–0.2)

## 2021-12-14 LAB — SAMPLE TO BLOOD BANK

## 2021-12-14 MED ORDER — DARBEPOETIN ALFA 500 MCG/ML IJ SOSY
500.0000 ug | PREFILLED_SYRINGE | Freq: Once | INTRAMUSCULAR | Status: AC
  Administered 2021-12-14: 500 ug via SUBCUTANEOUS

## 2021-12-21 ENCOUNTER — Encounter: Payer: Self-pay | Admitting: Oncology

## 2021-12-22 ENCOUNTER — Other Ambulatory Visit: Payer: Self-pay

## 2021-12-22 DIAGNOSIS — D649 Anemia, unspecified: Secondary | ICD-10-CM

## 2021-12-22 DIAGNOSIS — R161 Splenomegaly, not elsewhere classified: Secondary | ICD-10-CM

## 2021-12-22 NOTE — Addendum Note (Signed)
Addended by: Delice Bison E on: 12/22/2021 03:58 PM   Modules accepted: Orders

## 2021-12-28 ENCOUNTER — Inpatient Hospital Stay: Payer: Medicare HMO | Attending: Oncology

## 2021-12-28 ENCOUNTER — Inpatient Hospital Stay: Payer: Medicare HMO

## 2021-12-28 ENCOUNTER — Ambulatory Visit: Payer: Medicare HMO

## 2021-12-28 ENCOUNTER — Other Ambulatory Visit: Payer: Medicare HMO

## 2021-12-28 VITALS — BP 137/50 | HR 68

## 2021-12-28 DIAGNOSIS — D649 Anemia, unspecified: Secondary | ICD-10-CM

## 2021-12-28 DIAGNOSIS — D802 Selective deficiency of immunoglobulin A [IgA]: Secondary | ICD-10-CM | POA: Diagnosis present

## 2021-12-28 DIAGNOSIS — D759 Disease of blood and blood-forming organs, unspecified: Secondary | ICD-10-CM

## 2021-12-28 LAB — CBC WITH DIFFERENTIAL/PLATELET
Abs Immature Granulocytes: 0.03 10*3/uL (ref 0.00–0.07)
Basophils Absolute: 0 10*3/uL (ref 0.0–0.1)
Basophils Relative: 0 %
Eosinophils Absolute: 0 10*3/uL (ref 0.0–0.5)
Eosinophils Relative: 0 %
HCT: 28.9 % — ABNORMAL LOW (ref 39.0–52.0)
Hemoglobin: 10 g/dL — ABNORMAL LOW (ref 13.0–17.0)
Immature Granulocytes: 1 %
Lymphocytes Relative: 6 %
Lymphs Abs: 0.2 10*3/uL — ABNORMAL LOW (ref 0.7–4.0)
MCH: 33.4 pg (ref 26.0–34.0)
MCHC: 34.6 g/dL (ref 30.0–36.0)
MCV: 96.7 fL (ref 80.0–100.0)
Monocytes Absolute: 0.4 10*3/uL (ref 0.1–1.0)
Monocytes Relative: 10 %
Neutro Abs: 3.1 10*3/uL (ref 1.7–7.7)
Neutrophils Relative %: 83 %
Platelets: 250 10*3/uL (ref 150–400)
RBC: 2.99 MIL/uL — ABNORMAL LOW (ref 4.22–5.81)
RDW: 14.4 % (ref 11.5–15.5)
WBC: 3.7 10*3/uL — ABNORMAL LOW (ref 4.0–10.5)
nRBC: 0 % (ref 0.0–0.2)

## 2021-12-28 LAB — SAMPLE TO BLOOD BANK

## 2021-12-28 MED ORDER — DARBEPOETIN ALFA 500 MCG/ML IJ SOSY
500.0000 ug | PREFILLED_SYRINGE | Freq: Once | INTRAMUSCULAR | Status: AC
  Administered 2021-12-28: 500 ug via SUBCUTANEOUS
  Filled 2021-12-28: qty 1

## 2022-01-05 ENCOUNTER — Ambulatory Visit
Admission: RE | Admit: 2022-01-05 | Discharge: 2022-01-05 | Disposition: A | Discharge status: Home / Self Care | Payer: Medicare HMO | Source: Ambulatory Visit | Attending: Oncology | Admitting: Oncology

## 2022-01-05 DIAGNOSIS — D649 Anemia, unspecified: Secondary | ICD-10-CM | POA: Insufficient documentation

## 2022-01-05 DIAGNOSIS — K760 Fatty (change of) liver, not elsewhere classified: Secondary | ICD-10-CM | POA: Diagnosis not present

## 2022-01-05 DIAGNOSIS — R161 Splenomegaly, not elsewhere classified: Secondary | ICD-10-CM | POA: Diagnosis present

## 2022-01-05 LAB — GLUCOSE, CAPILLARY: Glucose-Capillary: 170 mg/dL — ABNORMAL HIGH (ref 70–99)

## 2022-01-05 MED ORDER — FLUDEOXYGLUCOSE F - 18 (FDG) INJECTION
16.0000 | Freq: Once | INTRAVENOUS | Status: AC | PRN
  Administered 2022-01-05: 16.2 via INTRAVENOUS

## 2022-01-11 ENCOUNTER — Other Ambulatory Visit: Payer: Self-pay | Admitting: Oncology

## 2022-01-11 ENCOUNTER — Other Ambulatory Visit: Payer: Medicare HMO

## 2022-01-11 ENCOUNTER — Inpatient Hospital Stay (HOSPITAL_BASED_OUTPATIENT_CLINIC_OR_DEPARTMENT_OTHER): Payer: Medicare HMO | Admitting: Oncology

## 2022-01-11 ENCOUNTER — Other Ambulatory Visit: Payer: Self-pay

## 2022-01-11 ENCOUNTER — Ambulatory Visit: Payer: Medicare HMO | Admitting: Oncology

## 2022-01-11 ENCOUNTER — Inpatient Hospital Stay: Payer: Medicare HMO

## 2022-01-11 ENCOUNTER — Ambulatory Visit: Payer: Medicare HMO

## 2022-01-11 ENCOUNTER — Encounter: Payer: Self-pay | Admitting: Oncology

## 2022-01-11 VITALS — BP 119/45 | HR 73 | Temp 99.1°F | Resp 18 | Ht 67.0 in | Wt 330.0 lb

## 2022-01-11 DIAGNOSIS — D759 Disease of blood and blood-forming organs, unspecified: Secondary | ICD-10-CM | POA: Diagnosis not present

## 2022-01-11 DIAGNOSIS — D649 Anemia, unspecified: Secondary | ICD-10-CM

## 2022-01-11 DIAGNOSIS — D802 Selective deficiency of immunoglobulin A [IgA]: Secondary | ICD-10-CM | POA: Diagnosis not present

## 2022-01-11 LAB — CBC WITH DIFFERENTIAL/PLATELET
Abs Immature Granulocytes: 0.05 10*3/uL (ref 0.00–0.07)
Basophils Absolute: 0 10*3/uL (ref 0.0–0.1)
Basophils Relative: 1 %
Eosinophils Absolute: 0 10*3/uL (ref 0.0–0.5)
Eosinophils Relative: 0 %
HCT: 30.9 % — ABNORMAL LOW (ref 39.0–52.0)
Hemoglobin: 10.8 g/dL — ABNORMAL LOW (ref 13.0–17.0)
Immature Granulocytes: 1 %
Lymphocytes Relative: 6 %
Lymphs Abs: 0.2 10*3/uL — ABNORMAL LOW (ref 0.7–4.0)
MCH: 33.3 pg (ref 26.0–34.0)
MCHC: 35 g/dL (ref 30.0–36.0)
MCV: 95.4 fL (ref 80.0–100.0)
Monocytes Absolute: 0.3 10*3/uL (ref 0.1–1.0)
Monocytes Relative: 9 %
Neutro Abs: 3.3 10*3/uL (ref 1.7–7.7)
Neutrophils Relative %: 83 %
Platelets: 265 10*3/uL (ref 150–400)
RBC: 3.24 MIL/uL — ABNORMAL LOW (ref 4.22–5.81)
RDW: 14 % (ref 11.5–15.5)
WBC: 3.9 10*3/uL — ABNORMAL LOW (ref 4.0–10.5)
nRBC: 0 % (ref 0.0–0.2)

## 2022-01-11 LAB — SAMPLE TO BLOOD BANK

## 2022-01-11 MED ORDER — DARBEPOETIN ALFA 500 MCG/ML IJ SOSY
500.0000 ug | PREFILLED_SYRINGE | Freq: Once | INTRAMUSCULAR | Status: AC
  Administered 2022-01-11: 500 ug via SUBCUTANEOUS
  Filled 2022-01-11: qty 1

## 2022-01-11 NOTE — Progress Notes (Signed)
[place]  [phone] [phone]) [phone]  ID: Dorathy Kinsman OB: 27-Jan-1965  MR#: [id number]  DYJ#:[id number]  Patient Care Team: Sofie Hartigan, MD as PCP - General (Family Medicine) Lloyd Huger, MD as Consulting Physician (Oncology)  CHIEF COMPLAINT: Idiopathic cytopenia of undetermined significance.    INTERVAL HISTORY: Patient returns to clinic today for repeat laboratory work, further evaluation, discussion of his imaging results, and treatment planning.  He continues to feel well and remains asymptomatic.  He denies any weakness or fatigue. He has no neurologic complaints.  He denies any recent fevers.  He has a fair appetite, but denies weight loss.  He denies any chest pain, shortness of breath, cough, or hemoptysis.  He denies any nausea, vomiting, constipation, or diarrhea.  He has no further melena or hematochezia.  He has no urinary complaints.  Patient offers no complaints today.  REVIEW OF SYSTEMS:   Review of Systems  Constitutional: Negative.  Negative for fever, malaise/fatigue and weight loss.  Respiratory: Negative.  Negative for cough, hemoptysis and shortness of breath.   Cardiovascular: Negative.  Negative for chest pain and leg swelling.  Gastrointestinal: Negative.  Negative for abdominal pain, blood in stool and melena.  Genitourinary: Negative.  Negative for hematuria.  Musculoskeletal: Negative.  Negative for back pain.  Skin: Negative.  Negative for rash.  Neurological: Negative.  Negative for dizziness, focal weakness, weakness and headaches.  Psychiatric/Behavioral: Negative.  The patient is not nervous/anxious.     As per HPI. Otherwise, a complete review of systems is negative.  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[place])    Morbid obesity ([place])    Sleep apnea     PAST SURGICAL HISTORY: Past Surgical History:  Procedure Laterality Date   COLONOSCOPY WITH PROPOFOL N/A 07/13/2020   Procedure: COLONOSCOPY WITH PROPOFOL;  Surgeon: Lesly Rubenstein, MD;  Location: [facility] ENDOSCOPY;  Service: Endoscopy;  Laterality: N/A;   COLONOSCOPY WITH PROPOFOL N/A 12/23/2020   Procedure: COLONOSCOPY WITH PROPOFOL;  Surgeon: Lucilla Lame, MD;  Location: [facility] ENDOSCOPY;  Service: Endoscopy;  Laterality: N/A;   ESOPHAGOGASTRODUODENOSCOPY (EGD) WITH PROPOFOL N/A 12/23/2020   Procedure: ESOPHAGOGASTRODUODENOSCOPY (EGD) WITH PROPOFOL;  Surgeon: Lucilla Lame, MD;  Location: [facility] ENDOSCOPY;  Service: Endoscopy;  Laterality: N/A;   GIVENS CAPSULE STUDY N/A 12/29/2020   Procedure: GIVENS CAPSULE STUDY;  Surgeon: Lin Landsman, MD;  Location: [facility] ENDOSCOPY;  Service: Gastroenterology;  Laterality: N/A;   TOOTH EXTRACTION      FAMILY HISTORY: Family History  Problem Relation Age of Onset   Benign prostatic hyperplasia Father    Psoriasis Father    Hypertension Mother    Hyperlipidemia Mother    Diabetes Maternal Grandmother    Diabetes Paternal Grandmother     ADVANCED DIRECTIVES (Y/N):  N  HEALTH MAINTENANCE: Social History   Tobacco Use   Smoking status: Never   Smokeless tobacco: Never  Vaping Use   Vaping Use: Never used  Substance Use Topics   Alcohol use: No   Drug use: No     Colonoscopy:  PAP:  Bone density:  Lipid panel:  Allergies  Allergen Reactions   Ampicillin Diarrhea   Bactrim [Sulfamethoxazole-Trimethoprim] Hives  Cephalexin    Claritin [Loratadine] Other (See Comments)    Irritates throat   Penicillins Rash    Current Outpatient Medications  Medication Sig Dispense Refill   acarbose (PRECOSE) 25 MG tablet Take 25 mg by mouth 3 (three) times daily with meals.     amLODipine (NORVASC) 10 MG tablet Take 10 mg by mouth  daily.     fenofibrate 160 MG tablet Take 160 mg by mouth daily.     fexofenadine (ALLEGRA) 180 MG tablet Take 180 mg by mouth daily.     fluticasone (FLONASE) 50 MCG/ACT nasal spray Place 2 sprays into both nostrils daily as needed.     glimepiride (AMARYL) 4 MG tablet Take 4 mg by mouth daily with breakfast.     HIBICLENS 4 % external liquid APPLY TOPICALLY DAILY AS NEEDED. TO WASHLEGS AND GROIN AREA 120 mL 3   latanoprost (XALATAN) 0.005 % ophthalmic solution Apply to eye.     lidocaine (LMX) 4 % cream Apply 1 application topically as needed.     lovastatin (MEVACOR) 10 MG tablet Take 10 mg by mouth daily.     metFORMIN (GLUCOPHAGE-XR) 500 MG 24 hr tablet Take 500-1,000 mg by mouth 2 (two) times daily. Takes 1 tablet with breakfast and 2 tablets with supper     metoprolol tartrate (LOPRESSOR) 50 MG tablet Take 50 mg by mouth 2 (two) times daily.     Multiple Vitamins-Minerals (MULTIVITAMIN WITH MINERALS) tablet Take 1 tablet by mouth daily.     mupirocin ointment (BACTROBAN) 2 % Apply to boils and ulcers QD PRN flares. 22 g 3   omeprazole (PRILOSEC) 20 MG capsule Take 20 mg by mouth daily.     pioglitazone (ACTOS) 30 MG tablet Take 30 mg by mouth daily.     ramipril (ALTACE) 10 MG capsule Take 10 mg by mouth 2 (two) times daily.     spironolactone (ALDACTONE) 50 MG tablet Take 50 mg by mouth daily.     terazosin (HYTRIN) 10 MG capsule Take 10 mg by mouth daily.      torsemide (DEMADEX) 20 MG tablet Take 20 mg by mouth daily.     traZODone (DESYREL) 150 MG tablet Take 150 mg by mouth at bedtime.     TRESIBA FLEXTOUCH 200 UNIT/ML FlexTouch Pen Inject 48 Units into the skin in the morning.     vitamin B-12 (CYANOCOBALAMIN) 1000 MCG tablet Take 1,000 mcg by mouth daily.     Hydrocortisone-Aloe 1 % CREA Apply 1 application topically 2 (two) times daily as needed (itching). To itchy area on legs (Patient not taking: Reported on 11/16/2021)     ketoconazole (NIZORAL) 2 % cream Apply to rash in  groin once a day as needed for flares. (Patient not taking: Reported on 11/16/2021) 60 g 11   trolamine salicylate (ASPERCREME) 10 % cream Apply 1 application topically as needed for muscle pain. (Patient not taking: Reported on 11/16/2021)     No current facility-administered medications for this visit.    OBJECTIVE: Vitals:   01/11/22 0941  BP: (!) 119/45  Pulse: 73  Resp: 18  Temp: 99.1 F (37.3 C)  SpO2: 98%      Body mass index is 51.69 kg/m.    ECOG FS:0 - Asymptomatic  General: Well-developed, well-nourished, no acute distress. Eyes: Pink conjunctiva, anicteric sclera. HEENT: Normocephalic, moist mucous membranes. Lungs: No audible wheezing or coughing. Heart: Regular rate and rhythm. Abdomen: Soft, nontender, no obvious distention. Musculoskeletal: No edema, cyanosis, or clubbing.  Neuro: Alert, answering all questions appropriately. Cranial nerves grossly intact. Skin: No rashes or petechiae noted. Psych: Normal affect.  LAB RESULTS:  Lab Results  Component Value Date   NA 136 01/20/2021   K 3.3 (L) 01/20/2021   CL 99 01/20/2021   CO2 26 01/20/2021   GLUCOSE 106 (H) 01/20/2021   BUN 41 (H) 01/20/2021   CREATININE 1.12 01/20/2021   CALCIUM 8.8 (L) 01/20/2021   PROT 6.8 01/20/2021   ALBUMIN 3.4 (L) 01/20/2021   AST 14 (L) 01/20/2021   ALT 13 01/20/2021   ALKPHOS 36 (L) 01/20/2021   BILITOT 1.0 01/20/2021   GFRNONAA >60 01/20/2021   GFRAA >60 10/25/2016    Lab Results  Component Value Date   WBC 3.9 (L) 01/11/2022   NEUTROABS 3.3 01/11/2022   HGB 10.8 (L) 01/11/2022   HCT 30.9 (L) 01/11/2022   MCV 95.4 01/11/2022   PLT 265 01/11/2022     STUDIES: NM PET Image Initial (PI) Skull Base To Thigh  Result Date: 01/10/2022 CLINICAL DATA:  Initial treatment strategy for splenomegaly with persistent symptomatic anemia. History of diabetes. EXAM: NUCLEAR MEDICINE PET SKULL BASE TO THIGH TECHNIQUE: 16.2 mCi F-18 FDG was injected intravenously. Full-ring PET  imaging was performed from the skull base to thigh after the radiotracer. CT data was obtained and used for attenuation correction and anatomic localization. Fasting blood glucose: 170 mg/dl COMPARISON:  Abdominal ultrasound 08/31/2021. Abdominopelvic CT 10/22/2016. FINDINGS: Mediastinal blood pool activity: SUV max 1.5 Liver activity: SUV max 3.1 NECK: No hypermetabolic cervical lymph nodes are identified. No suspicious activity identified within the pharyngeal mucosal space. Incidental CT findings: none CHEST: There are no hypermetabolic mediastinal, hilar or axillary lymph nodes. No hypermetabolic pulmonary activity or suspicious nodularity. Incidental CT findings: Coronary artery atherosclerosis, mild cardiomegaly and a small pericardial effusion are noted. The lungs are clear. ABDOMEN/PELVIS: There is no hypermetabolic activity within the liver, adrenal glands, spleen or pancreas. Homogeneous low level metabolic activity in the spleen (SUV max 2.6). There is no hypermetabolic nodal activity in the abdomen or pelvis. Incidental CT findings: Previously demonstrated hepatic steatosis has improved. The spleen measures 16.2 cm in length consistent with mild splenomegaly. There is a stable small periumbilical hernia containing only fat. SKELETON: There is no hypermetabolic activity to suggest osseous metastatic disease. Mild focal hypermetabolic activity superior to the right patella may represent quadriceps tendinosis. Incidental CT findings: Mild spondylosis. IMPRESSION: 1. No hypermetabolic activity is demonstrated in the neck, chest, abdomen or pelvis to suggest a lymphoproliferative process. 2. Mild splenomegaly with homogeneous low-level metabolic activity, nonspecific. 3. Interval improvement in previously demonstrated hepatic steatosis. 4. Mild focal hypermetabolic activity superior to the right patella may reflect quadriceps tendinosis. Electronically Signed   By: Richardean Sale M.D.   On: 01/10/2022 09:35     ASSESSMENT:Idiopathic cytopenia of undetermined significance.  PLAN:    Idiopathic cytopenia of undetermined significance: Bone marrow biopsy on January 03, 2021 revealed a hypercellular marrow with erythroid hyperplasia and dyserythropoiesis.  Patient initially thought to have MDS, but second opinion from [facility] did not feel his bone marrow supported this diagnosis.  No blasts, increased plasma cells, or clonality was noted.  Cytogenetics were reported as normal.  PET scan results from January 10, 2022 reviewed independently and report as above with no obvious evidence of underlying hypermetabolism suggestive of malignancy.  Recommendation from [facility] is to pursue Rituxan once weekly for 4 weeks to which patient has agreed.  Will continue Aranesp 500 mcg every 2 weeks.  Patient has not required a blood transfusion since June 17, 2021.  Patient's hemoglobin is below 11.0, therefore will proceed with Aranesp today.  Once patient's hemoglobin is greater than 11, can consider decreasing frequency of Aranesp. 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  Patient will return to clinic on January 31, 2022 to initiate cycle 1 of 4 of weekly Rituxan.   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  Recent and repeat IgA levels were undetectable with a normal IgM and IgG.  Patient also has anti-IgA antibodies.  Patients with isolated IgA deficiency can have anaphylactoid reactions to blood transfusions.   Shortness of breath: Patient does not complain of this today.  Follow-up with pulmonary as indicated. Positive ANA: Patient noted to have a positive and ANA at his work-up at [facility].  The clinical significance of this is unclear. Leukopenia: Mild, monitor.   Splenomegaly: Mild.  PET scan results as above.  Possibly related to underlying NASH.   NASH: Patient reports he is being evaluated at [facility].   Patient expressed understanding and was in agreement with this plan. He also understands that He can call clinic at any time with any questions, concerns, or complaints.    Lloyd Huger, MD   01/11/2022 12:48 PM

## 2022-01-11 NOTE — Progress Notes (Signed)
START OFF PATHWAY REGIMEN - Other   [id number]:Rituximab IV/SUBQ D1 q7 Days:   Cycle 1: A cycle is 7 days:     Rituximab-xxxx    Cycles 2 and beyond: A cycle is every 7 days:     Rituximab and hyaluronidase human   **Always confirm dose/schedule in your pharmacy ordering system**  Patient Characteristics: Intent of Therapy: Curative Intent, Discussed with Patient 

## 2022-01-12 ENCOUNTER — Other Ambulatory Visit: Payer: Self-pay

## 2022-01-17 ENCOUNTER — Telehealth: Payer: Self-pay | Admitting: *Deleted

## 2022-01-17 ENCOUNTER — Encounter: Payer: Self-pay | Admitting: Oncology

## 2022-01-17 NOTE — Telephone Encounter (Signed)
Pt states he got a denial letter from [place] ( [employer] is his insurance) for his Rituxan. He is wondering what he needs to do. He wanted to let Dr Grayland Ormond know there is a problem.  I told him I would alert our authorization folks here as well.Pt would like a call back when we get to the bottom of this problem.

## 2022-01-24 ENCOUNTER — Encounter: Payer: Self-pay | Admitting: Oncology

## 2022-01-26 ENCOUNTER — Telehealth: Payer: Self-pay | Admitting: *Deleted

## 2022-01-26 ENCOUNTER — Encounter: Payer: Self-pay | Admitting: *Deleted

## 2022-01-26 NOTE — Telephone Encounter (Signed)
Called patient to inform him we were denied for an expedited appeal decision. [name] will make a decision regarding the Rituxan on 01/30/22. Informed pt he would not receive Rituxan on 01/31/22 as the drug would not have enough time to be ordered if authorization comes through. Reassured pt that if that drug does not get authorized we would continue with his current treatment epoetin injections. If it does get authorization his start date would be 02/07/22.

## 2022-01-27 ENCOUNTER — Other Ambulatory Visit: Payer: Self-pay | Admitting: Oncology

## 2022-01-30 ENCOUNTER — Encounter: Payer: Self-pay | Admitting: Oncology

## 2022-01-31 ENCOUNTER — Inpatient Hospital Stay: Payer: Medicare HMO

## 2022-01-31 ENCOUNTER — Inpatient Hospital Stay: Payer: Medicare HMO | Attending: Oncology

## 2022-01-31 ENCOUNTER — Inpatient Hospital Stay (HOSPITAL_BASED_OUTPATIENT_CLINIC_OR_DEPARTMENT_OTHER): Payer: Medicare HMO | Admitting: Oncology

## 2022-01-31 DIAGNOSIS — D802 Selective deficiency of immunoglobulin A [IgA]: Secondary | ICD-10-CM | POA: Diagnosis present

## 2022-01-31 DIAGNOSIS — R0602 Shortness of breath: Secondary | ICD-10-CM | POA: Diagnosis not present

## 2022-01-31 DIAGNOSIS — D649 Anemia, unspecified: Secondary | ICD-10-CM | POA: Insufficient documentation

## 2022-01-31 DIAGNOSIS — Z5112 Encounter for antineoplastic immunotherapy: Secondary | ICD-10-CM | POA: Diagnosis present

## 2022-01-31 DIAGNOSIS — D759 Disease of blood and blood-forming organs, unspecified: Secondary | ICD-10-CM

## 2022-01-31 DIAGNOSIS — E1165 Type 2 diabetes mellitus with hyperglycemia: Secondary | ICD-10-CM | POA: Insufficient documentation

## 2022-01-31 DIAGNOSIS — R161 Splenomegaly, not elsewhere classified: Secondary | ICD-10-CM | POA: Insufficient documentation

## 2022-01-31 DIAGNOSIS — K7581 Nonalcoholic steatohepatitis (NASH): Secondary | ICD-10-CM | POA: Diagnosis not present

## 2022-01-31 LAB — COMPREHENSIVE METABOLIC PANEL
ALT: 33 U/L (ref 0–44)
AST: 33 U/L (ref 15–41)
Albumin: 4.1 g/dL (ref 3.5–5.0)
Alkaline Phosphatase: 41 U/L (ref 38–126)
Anion gap: 9 (ref 5–15)
BUN: 38 mg/dL — ABNORMAL HIGH (ref 6–20)
CO2: 21 mmol/L — ABNORMAL LOW (ref 22–32)
Calcium: 9 mg/dL (ref 8.9–10.3)
Chloride: 105 mmol/L (ref 98–111)
Creatinine, Ser: 1.04 mg/dL (ref 0.61–1.24)
GFR, Estimated: >60 mL/min (ref >60)
Glucose, Bld: 220 mg/dL — ABNORMAL HIGH (ref 70–99)
Potassium: 4.4 mmol/L (ref 3.5–5.1)
Sodium: 135 mmol/L (ref 135–145)
Total Bilirubin: 0.5 mg/dL (ref 0.3–1.2)
Total Protein: 7.5 g/dL (ref 6.5–8.1)

## 2022-01-31 LAB — CBC WITH DIFFERENTIAL/PLATELET
Abs Immature Granulocytes: 0.03 10*3/uL (ref 0.00–0.07)
Basophils Absolute: 0 10*3/uL (ref 0.0–0.1)
Basophils Relative: 1 %
Eosinophils Absolute: 0 10*3/uL (ref 0.0–0.5)
Eosinophils Relative: 0 %
HCT: 30 % — ABNORMAL LOW (ref 39.0–52.0)
Hemoglobin: 10.3 g/dL — ABNORMAL LOW (ref 13.0–17.0)
Immature Granulocytes: 1 %
Lymphocytes Relative: 7 %
Lymphs Abs: 0.2 10*3/uL — ABNORMAL LOW (ref 0.7–4.0)
MCH: 33.6 pg (ref 26.0–34.0)
MCHC: 34.3 g/dL (ref 30.0–36.0)
MCV: 97.7 fL (ref 80.0–100.0)
Monocytes Absolute: 0.3 10*3/uL (ref 0.1–1.0)
Monocytes Relative: 10 %
Neutro Abs: 2.7 10*3/uL (ref 1.7–7.7)
Neutrophils Relative %: 81 %
Platelets: 268 10*3/uL (ref 150–400)
RBC: 3.07 MIL/uL — ABNORMAL LOW (ref 4.22–5.81)
RDW: 13.7 % (ref 11.5–15.5)
WBC: 3.3 10*3/uL — ABNORMAL LOW (ref 4.0–10.5)
nRBC: 0 % (ref 0.0–0.2)

## 2022-01-31 LAB — SAMPLE TO BLOOD BANK

## 2022-01-31 MED ORDER — DARBEPOETIN ALFA 500 MCG/ML IJ SOSY
500.0000 ug | PREFILLED_SYRINGE | Freq: Once | INTRAMUSCULAR | Status: AC
  Administered 2022-01-31: 500 ug via SUBCUTANEOUS
  Filled 2022-01-31: qty 1

## 2022-01-31 NOTE — Progress Notes (Signed)
[place]  Telephone:([phone]) [phone] Fax:(336) [phone]  ID: Patrick Duncan OB: 15-Dec-1965  MR#: [id number]  WTU#:[id number]  Patient Care Team: Sofie Hartigan, MD as PCP - General (Family Medicine) Lloyd Huger, MD as Consulting Physician (Oncology)  CHIEF COMPLAINT: Idiopathic cytopenia of undetermined significance.    INTERVAL HISTORY: Patient returns to clinic today for repeat laboratory work, further evaluation, and continuation of Aranesp.  He currently feels well and is asymptomatic.  He denies any weakness or fatigue. He has no neurologic complaints.  He denies any recent fevers.  He has a fair appetite, but denies weight loss.  He denies any chest pain, shortness of breath, cough, or hemoptysis.  He denies any nausea, vomiting, constipation, or diarrhea.  He has no further melena or hematochezia.  He has no urinary complaints.  [name]  REVIEW OF SYSTEMS:   Review of Systems  Constitutional: Negative.  Negative for fever, malaise/fatigue and weight loss.  Respiratory: Negative.  Negative for cough, hemoptysis and shortness of breath.   Cardiovascular: Negative.  Negative for chest pain and leg swelling.  Gastrointestinal: Negative.  Negative for abdominal pain, blood in stool and melena.  Genitourinary: Negative.  Negative for hematuria.  Musculoskeletal: Negative.  Negative for back pain.  Skin: Negative.  Negative for rash.  Neurological: Negative.  Negative for dizziness, focal weakness, weakness and headaches.  Psychiatric/Behavioral: Negative.  The patient is not nervous/anxious.     As per HPI. Otherwise, a complete review of systems is negative.  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[place])    Morbid obesity ([place])    Sleep apnea     PAST SURGICAL HISTORY: Past Surgical History:  Procedure Laterality Date   COLONOSCOPY WITH PROPOFOL N/A 07/13/2020   Procedure: COLONOSCOPY WITH PROPOFOL;  Surgeon: Lesly Rubenstein, MD;  Location: [facility] ENDOSCOPY;  Service: Endoscopy;  Laterality: N/A;   COLONOSCOPY WITH PROPOFOL N/A 12/23/2020   Procedure: COLONOSCOPY WITH PROPOFOL;  Surgeon: Lucilla Lame, MD;  Location: [facility] ENDOSCOPY;  Service: Endoscopy;  Laterality: N/A;   ESOPHAGOGASTRODUODENOSCOPY (EGD) WITH PROPOFOL N/A 12/23/2020   Procedure: ESOPHAGOGASTRODUODENOSCOPY (EGD) WITH PROPOFOL;  Surgeon: Lucilla Lame, MD;  Location: [facility] ENDOSCOPY;  Service: Endoscopy;  Laterality: N/A;   GIVENS CAPSULE STUDY N/A 12/29/2020   Procedure: GIVENS CAPSULE STUDY;  Surgeon: Lin Landsman, MD;  Location: [facility] ENDOSCOPY;  Service: Gastroenterology;  Laterality: N/A;   TOOTH EXTRACTION      FAMILY HISTORY: Family History  Problem Relation Age of Onset   Benign prostatic hyperplasia Father    Psoriasis Father    Hypertension Mother    Hyperlipidemia Mother    Diabetes Maternal Grandmother    Diabetes Paternal Grandmother     ADVANCED DIRECTIVES (Y/N):  N  HEALTH MAINTENANCE: Social History   Tobacco Use   Smoking status: Never   Smokeless tobacco: Never  Vaping Use   Vaping Use: Never used  Substance Use Topics   Alcohol use: No   Drug use: No     Colonoscopy:  PAP:  Bone density:  Lipid panel:  Allergies  Allergen Reactions   Ampicillin Diarrhea   Bactrim [Sulfamethoxazole-Trimethoprim] Hives   Cephalexin  Claritin [Loratadine] Other (See Comments)    Irritates throat   Penicillins Rash    Current Outpatient Medications  Medication Sig Dispense Refill   acarbose (PRECOSE) 25 MG tablet Take 25 mg by mouth 3 (three) times daily with meals.     amLODipine (NORVASC) 10 MG tablet Take 10 mg by mouth daily.     fenofibrate 160 MG tablet Take  160 mg by mouth daily.     fexofenadine (ALLEGRA) 180 MG tablet Take 180 mg by mouth daily.     fluticasone (FLONASE) 50 MCG/ACT nasal spray Place 2 sprays into both nostrils daily as needed.     glimepiride (AMARYL) 4 MG tablet Take 4 mg by mouth daily with breakfast.     HIBICLENS 4 % external liquid APPLY TOPICALLY DAILY AS NEEDED. TO WASHLEGS AND GROIN AREA 120 mL 3   latanoprost (XALATAN) 0.005 % ophthalmic solution Apply to eye.     lidocaine (LMX) 4 % cream Apply 1 application topically as needed.     lovastatin (MEVACOR) 10 MG tablet Take 10 mg by mouth daily.     metFORMIN (GLUCOPHAGE-XR) 500 MG 24 hr tablet Take 500-1,000 mg by mouth 2 (two) times daily. Takes 1 tablet with breakfast and 2 tablets with supper     metoprolol tartrate (LOPRESSOR) 50 MG tablet Take 50 mg by mouth 2 (two) times daily.     Multiple Vitamins-Minerals (MULTIVITAMIN WITH MINERALS) tablet Take 1 tablet by mouth daily.     mupirocin ointment (BACTROBAN) 2 % Apply to boils and ulcers QD PRN flares. 22 g 3   omeprazole (PRILOSEC) 20 MG capsule Take 20 mg by mouth daily.     pioglitazone (ACTOS) 30 MG tablet Take 30 mg by mouth daily.     ramipril (ALTACE) 10 MG capsule Take 10 mg by mouth 2 (two) times daily.     spironolactone (ALDACTONE) 50 MG tablet Take 50 mg by mouth daily.     terazosin (HYTRIN) 10 MG capsule Take 10 mg by mouth daily.      torsemide (DEMADEX) 20 MG tablet Take 20 mg by mouth daily.     traZODone (DESYREL) 150 MG tablet Take 150 mg by mouth at bedtime.     TRESIBA FLEXTOUCH 200 UNIT/ML FlexTouch Pen Inject 48 Units into the skin in the morning. Taking 100 units of insulin     vitamin B-12 (CYANOCOBALAMIN) 1000 MCG tablet Take 1,000 mcg by mouth daily.     Hydrocortisone-Aloe 1 % CREA Apply 1 application topically 2 (two) times daily as needed (itching). To itchy area on legs (Patient not taking: Reported on 11/16/2021)     ketoconazole (NIZORAL) 2 % cream Apply to rash in groin once a day as  needed for flares. (Patient not taking: Reported on 11/16/2021) 60 g 11   trolamine salicylate (ASPERCREME) 10 % cream Apply 1 application topically as needed for muscle pain. (Patient not taking: Reported on 11/16/2021)     No current facility-administered medications for this visit.    OBJECTIVE: Vitals:   01/31/22 0834  BP: (!) 136/51  Pulse: 65  Resp: 16  Temp: (!) 97.2 F (36.2 C)  SpO2: 100%      Body mass index is 51.37 kg/m.    ECOG FS:0 - Asymptomatic  General: Well-developed, well-nourished, no acute distress. Eyes: Pink conjunctiva, anicteric sclera. HEENT: Normocephalic, moist mucous membranes. Lungs: No audible wheezing or coughing. Heart: Regular rate and rhythm. Abdomen: Soft, nontender, no obvious distention. Musculoskeletal: No edema, cyanosis,  or clubbing. Neuro: Alert, answering all questions appropriately. Cranial nerves grossly intact. Skin: No rashes or petechiae noted. Psych: Normal affect.  LAB RESULTS:  Lab Results  Component Value Date   NA 135 01/31/2022   K 4.4 01/31/2022   CL 105 01/31/2022   CO2 21 (L) 01/31/2022   GLUCOSE 220 (H) 01/31/2022   BUN 38 (H) 01/31/2022   CREATININE 1.04 01/31/2022   CALCIUM 9.0 01/31/2022   PROT 7.5 01/31/2022   ALBUMIN 4.1 01/31/2022   AST 33 01/31/2022   ALT 33 01/31/2022   ALKPHOS 41 01/31/2022   BILITOT 0.5 01/31/2022   GFRNONAA >60 01/31/2022   GFRAA >60 10/25/2016    Lab Results  Component Value Date   WBC 3.3 (L) 01/31/2022   NEUTROABS 2.7 01/31/2022   HGB 10.3 (L) 01/31/2022   HCT 30.0 (L) 01/31/2022   MCV 97.7 01/31/2022   PLT 268 01/31/2022     STUDIES: NM PET Image Initial (PI) Skull Base To Thigh  Result Date: 01/10/2022 CLINICAL DATA:  Initial treatment strategy for splenomegaly with persistent symptomatic anemia. History of diabetes. EXAM: NUCLEAR MEDICINE PET SKULL BASE TO THIGH TECHNIQUE: 16.2 mCi F-18 FDG was injected intravenously. Full-ring PET imaging was performed from the  skull base to thigh after the radiotracer. CT data was obtained and used for attenuation correction and anatomic localization. Fasting blood glucose: 170 mg/dl COMPARISON:  Abdominal ultrasound 08/31/2021. Abdominopelvic CT 10/22/2016. FINDINGS: Mediastinal blood pool activity: SUV max 1.5 Liver activity: SUV max 3.1 NECK: No hypermetabolic cervical lymph nodes are identified. No suspicious activity identified within the pharyngeal mucosal space. Incidental CT findings: none CHEST: There are no hypermetabolic mediastinal, hilar or axillary lymph nodes. No hypermetabolic pulmonary activity or suspicious nodularity. Incidental CT findings: Coronary artery atherosclerosis, mild cardiomegaly and a small pericardial effusion are noted. The lungs are clear. ABDOMEN/PELVIS: There is no hypermetabolic activity within the liver, adrenal glands, spleen or pancreas. Homogeneous low level metabolic activity in the spleen (SUV max 2.6). There is no hypermetabolic nodal activity in the abdomen or pelvis. Incidental CT findings: Previously demonstrated hepatic steatosis has improved. The spleen measures 16.2 cm in length consistent with mild splenomegaly. There is a stable small periumbilical hernia containing only fat. SKELETON: There is no hypermetabolic activity to suggest osseous metastatic disease. Mild focal hypermetabolic activity superior to the right patella may represent quadriceps tendinosis. Incidental CT findings: Mild spondylosis. IMPRESSION: 1. No hypermetabolic activity is demonstrated in the neck, chest, abdomen or pelvis to suggest a lymphoproliferative process. 2. Mild splenomegaly with homogeneous low-level metabolic activity, nonspecific. 3. Interval improvement in previously demonstrated hepatic steatosis. 4. Mild focal hypermetabolic activity superior to the right patella may reflect quadriceps tendinosis. Electronically Signed   By: Richardean Sale M.D.   On: 01/10/2022 09:35    ASSESSMENT:Idiopathic  cytopenia of undetermined significance.  PLAN:    Idiopathic cytopenia of undetermined significance: Bone marrow biopsy on January 03, 2021 revealed a hypercellular marrow with erythroid hyperplasia and dyserythropoiesis.  Patient initially thought to have MDS, but second opinion from [facility] did not feel his bone marrow results supported this diagnosis.  No blasts, increased plasma cells, or clonality was noted.  Cytogenetics were reported as normal.  PET scan results from January 10, 2022 reviewed independently with no obvious evidence of underlying hypermetabolism suggestive of malignancy.  Recommendation from [facility] is to pursue Rituxan once weekly for 4 weeks to which patient has agreed.  Will continue Aranesp 500 mcg every 3 weeks.  Patient has not required  a blood transfusion since June 17, 2021.  Patient's hemoglobin is below 11.0, therefore will proceed with Aranesp today.  Once patient's hemoglobin is greater than 11, can consider decreasing frequency of Aranesp. 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  Return to clinic in 1 week for further evaluation and initiation of cycle 1 of 4 weekly Rituxan. 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  Recent and repeat IgA levels were undetectable with a normal IgM and IgG.  Patient also has anti-IgA antibodies.  Patients with isolated IgA deficiency can have anaphylactoid reactions to blood transfusions.   Shortness of breath: Patient does not complain of this today.  Follow-up with pulmonary as indicated. Positive ANA: Patient noted to have a positive and ANA at his work-up at [facility].  The clinical significance of this is unclear. Leukopenia: Chronic and unchanged. Splenomegaly: Mild.  PET scan results as above.  Possibly related to underlying NASH.   NASH: Patient reports he is being evaluated at [facility] clinic.  I spent a total of 30 minutes  reviewing chart data, face-to-face evaluation with the patient, counseling and coordination of care as detailed above.    Patient expressed understanding and was in agreement with this plan. He also understands that He can call clinic at any time with any questions, concerns, or complaints.    Lloyd Huger, MD   01/31/2022 10:32 AM

## 2022-02-02 ENCOUNTER — Encounter: Payer: Self-pay | Admitting: Oncology

## 2022-02-02 ENCOUNTER — Telehealth: Payer: Self-pay | Admitting: *Deleted

## 2022-02-02 NOTE — Telephone Encounter (Signed)
Returned phone call to patient. Confirmed appt for starting new drug Rituxan on 02/07/22 at 9 am. Pt will continue to take aranesp injections as needed every 3 weeks. Pt will be receiving Free Drug ( Rituxan)  under [employer] [facility] [employer].

## 2022-02-07 ENCOUNTER — Inpatient Hospital Stay: Payer: Medicare HMO

## 2022-02-07 ENCOUNTER — Inpatient Hospital Stay (HOSPITAL_BASED_OUTPATIENT_CLINIC_OR_DEPARTMENT_OTHER): Payer: Medicare HMO | Admitting: Oncology

## 2022-02-07 ENCOUNTER — Other Ambulatory Visit: Payer: Self-pay | Admitting: Oncology

## 2022-02-07 ENCOUNTER — Encounter: Payer: Self-pay | Admitting: Oncology

## 2022-02-07 VITALS — BP 129/56 | HR 76 | Resp 18

## 2022-02-07 VITALS — BP 156/49 | HR 84 | Temp 97.4°F | Resp 18 | Ht 67.0 in | Wt 328.0 lb

## 2022-02-07 DIAGNOSIS — D759 Disease of blood and blood-forming organs, unspecified: Secondary | ICD-10-CM

## 2022-02-07 DIAGNOSIS — Z5112 Encounter for antineoplastic immunotherapy: Secondary | ICD-10-CM | POA: Diagnosis not present

## 2022-02-07 LAB — HEPATITIS B SURFACE ANTIGEN: Hepatitis B Surface Ag: NONREACTIVE

## 2022-02-07 LAB — SAMPLE TO BLOOD BANK

## 2022-02-07 LAB — HEPATITIS B CORE ANTIBODY, TOTAL: Hep B Core Total Ab: NONREACTIVE

## 2022-02-07 LAB — HEMOGLOBIN: Hemoglobin: 10.8 g/dL — ABNORMAL LOW (ref 13.0–17.0)

## 2022-02-07 MED ORDER — ACETAMINOPHEN 325 MG PO TABS
650.0000 mg | ORAL_TABLET | Freq: Once | ORAL | Status: AC
  Administered 2022-02-07: 650 mg via ORAL
  Filled 2022-02-07: qty 2

## 2022-02-07 MED ORDER — SODIUM CHLORIDE 0.9 % IV SOLN
375.0000 mg/m2 | Freq: Once | INTRAVENOUS | Status: AC
  Administered 2022-02-07: 1000 mg via INTRAVENOUS
  Filled 2022-02-07: qty 100

## 2022-02-07 MED ORDER — SODIUM CHLORIDE 0.9 % IV SOLN
Freq: Once | INTRAVENOUS | Status: AC
  Filled 2022-02-07: qty 250

## 2022-02-07 MED ORDER — DIPHENHYDRAMINE HCL 25 MG PO CAPS
50.0000 mg | ORAL_CAPSULE | Freq: Once | ORAL | Status: AC
  Administered 2022-02-07: 50 mg via ORAL
  Filled 2022-02-07: qty 2

## 2022-02-07 NOTE — Patient Instructions (Signed)
[place]  Discharge Instructions: Thank you for choosing [facility] to provide your oncology and hematology care.  If you have a lab appointment with the [facility], please go directly to the [facility] and check in at the registration area.  Wear comfortable clothing and clothing appropriate for easy access to any Portacath or PICC line.   We strive to give you quality time with your provider. You may need to reschedule your appointment if you arrive late (15 or more minutes).  Arriving late affects you and other patients whose appointments are after yours.  Also, if you miss three or more appointments without notifying the office, you may be dismissed from the clinic at the provider's discretion.      For prescription refill requests, have your pharmacy contact our office and allow 72 hours for refills to be completed.    Today you received the following chemotherapy and/or immunotherapy agents RITUXAN      To help prevent nausea and vomiting after your treatment, we encourage you to take your nausea medication as directed.  BELOW ARE SYMPTOMS THAT SHOULD BE REPORTED IMMEDIATELY: *FEVER GREATER THAN 100.4 F (38 C) OR HIGHER *CHILLS OR SWEATING *NAUSEA AND VOMITING THAT IS NOT CONTROLLED WITH YOUR NAUSEA MEDICATION *UNUSUAL SHORTNESS OF BREATH *UNUSUAL BRUISING OR BLEEDING *URINARY PROBLEMS (pain or burning when urinating, or frequent urination) *BOWEL PROBLEMS (unusual diarrhea, constipation, pain near the anus) TENDERNESS IN MOUTH AND THROAT WITH OR WITHOUT PRESENCE OF ULCERS (sore throat, sores in mouth, or a toothache) UNUSUAL RASH, SWELLING OR PAIN  UNUSUAL VAGINAL DISCHARGE OR ITCHING   Items with * indicate a potential emergency and should be followed up as soon as possible or go to the Emergency Department if any problems should occur.  Please show the CHEMOTHERAPY ALERT CARD or IMMUNOTHERAPY ALERT CARD at check-in to  the Emergency Department and triage nurse.  Should you have questions after your visit or need to cancel or reschedule your appointment, please contact [place]  [phone] and follow the prompts.  Office hours are 8:00 a.m. to 4:30 p.m. Monday - Friday. Please note that voicemails left after 4:00 p.m. may not be returned until the following business day.  We are closed weekends and major holidays. You have access to a nurse at all times for urgent questions. Please call the main number to the clinic [phone] and follow the prompts.  For any non-urgent questions, you may also contact your provider using MyChart. We now offer e-Visits for anyone 37 and older to request care online for non-urgent symptoms. For details visit [email or web address].   Also download the MyChart app! Go to the app store, search "MyChart", open the app, select [facility], and log in with your MyChart username and password.   Rituximab Injection What is this medication? RITUXIMAB (ri TUX i mab) treats leukemia and lymphoma. It works by blocking a protein that causes cancer cells to grow and multiply. This helps to slow or stop the spread of cancer cells. It may also be used to treat autoimmune conditions, such as arthritis. It works by slowing down an overactive immune system. It is a monoclonal antibody. This medicine may be used for other purposes; ask your health care provider or pharmacist if you have questions. COMMON BRAND NAME(S): RIABNI, Rituxan, RUXIENCE, truxima What should I tell my care team before I take this medication? They need to know if you have any of  these conditions: Chest pain Heart disease Immune system problems Infection, such as chickenpox, cold sores, hepatitis B, herpes Irregular heartbeat or rhythm Kidney disease Low blood counts, such as low white cells, platelets, red cells Lung disease Recent or upcoming vaccine An unusual or allergic  reaction to rituximab, other medications, foods, dyes, or preservatives Pregnant or trying to get pregnant Breast-feeding How should I use this medication? This medication is injected into a vein. It is given by a care team in a hospital or clinic setting. A special MedGuide will be given to you before each treatment. Be sure to read this information carefully each time. Talk to your care team about the use of this medication in children. While this medication may be prescribed for children as young as 6 months for selected conditions, precautions do apply. Overdosage: If you think you have taken too much of this medicine contact a poison control center or emergency room at once. NOTE: This medicine is only for you. Do not share this medicine with others. What if I miss a dose? Keep appointments for follow-up doses. It is important not to miss your dose. Call your care team if you are unable to keep an appointment. What may interact with this medication? Do not take this medication with any of the following: Live vaccines This medication may also interact with the following: Cisplatin This list may not describe all possible interactions. Give your health care provider a list of all the medicines, herbs, non-prescription drugs, or dietary supplements you use. Also tell them if you smoke, drink alcohol, or use illegal drugs. Some items may interact with your medicine. What should I watch for while using this medication? Your condition will be monitored carefully while you are receiving this medication. You may need blood work while taking this medication. This medication can cause serious infusion reactions. To reduce the risk your care team may give you other medications to take before receiving this one. Be sure to follow the directions from your care team. This medication may increase your risk of getting an infection. Call your care team for advice if you get a fever, chills, sore throat, or  other symptoms of a cold or flu. Do not treat yourself. Try to avoid being around people who are sick. Call your care team if you are around anyone with measles, chickenpox, or if you develop sores or blisters that do not heal properly. Avoid taking medications that contain aspirin, acetaminophen, ibuprofen, naproxen, or ketoprofen unless instructed by your care team. These medications may hide a fever. This medication may cause serious skin reactions. They can happen weeks to months after starting the medication. Contact your care team right away if you notice fevers or flu-like symptoms with a rash. The rash may be red or purple and then turn into blisters or peeling of the skin. You may also notice a red rash with swelling of the face, lips, or lymph nodes in your neck or under your arms. In some patients, this medication may cause a serious brain infection that may cause death. If you have any problems seeing, thinking, speaking, walking, or standing, tell your care team right away. If you cannot reach your care team, urgently seek another source of medical care. Talk to your care team if you may be pregnant. Serious birth defects can occur if you take this medication during pregnancy and for 12 months after the last dose. You will need a negative pregnancy test before starting this medication. Contraception is  recommended while taking this medication and for 12 months after the last dose. Your care team can help you find the option that works for you. Do not breastfeed while taking this medication and for at least 6 months after the last dose. What side effects may I notice from receiving this medication? Side effects that you should report to your care team as soon as possible: Allergic reactions or angioedema--skin rash, itching or hives, swelling of the face, eyes, lips, tongue, arms, or legs, trouble swallowing or breathing Bowel blockage--stomach cramping, unable to have a bowel movement or pass  gas, loss of appetite, vomiting Dizziness, loss of balance or coordination, confusion or trouble speaking Heart attack--pain or tightness in the chest, shoulders, arms, or jaw, nausea, shortness of breath, cold or clammy skin, feeling faint or lightheaded Heart rhythm changes--fast or irregular heartbeat, dizziness, feeling faint or lightheaded, chest pain, trouble breathing Infection--fever, chills, cough, sore throat, wounds that don't heal, pain or trouble when passing urine, general feeling of discomfort or being unwell Infusion reactions--chest pain, shortness of breath or trouble breathing, feeling faint or lightheaded Kidney injury--decrease in the amount of urine, swelling of the ankles, hands, or feet Liver injury--right upper belly pain, loss of appetite, nausea, light-colored stool, dark yellow or brown urine, yellowing skin or eyes, unusual weakness or fatigue Redness, blistering, peeling, or loosening of the skin, including inside the mouth Stomach pain that is severe, does not go away, or gets worse Tumor lysis syndrome (TLS)--nausea, vomiting, diarrhea, decrease in the amount of urine, dark urine, unusual weakness or fatigue, confusion, muscle pain or cramps, fast or irregular heartbeat, joint pain Side effects that usually do not require medical attention (report to your care team if they continue or are bothersome): Headache Joint pain Nausea Runny or stuffy nose Unusual weakness or fatigue This list may not describe all possible side effects. Call your doctor for medical advice about side effects. You may report side effects to FDA at 1-800-FDA-1088. Where should I keep my medication? This medication is given in a hospital or clinic. It will not be stored at home. NOTE: This sheet is a summary. It may not cover all possible information. If you have questions about this medicine, talk to your doctor, pharmacist, or health care provider.  2023 Elsevier/Gold Standard (2021-05-17  00:00:00)

## 2022-02-07 NOTE — Progress Notes (Signed)
[place]  [phone] [phone]  ID: Dorathy Kinsman OB: [birth year]  MR#: [id number]  MWU#:[id number]  Patient Care Team: Sofie Hartigan, MD as PCP - General (Family Medicine) Lloyd Huger, MD as Consulting Physician (Oncology)  CHIEF COMPLAINT: Idiopathic cytopenia of undetermined significance.    INTERVAL HISTORY: Patient returns to clinic today for further evaluation and initiation of cycle 1 of 4 of weekly Rituxan.  He currently feels well and is asymptomatic.  He denies any weakness or fatigue.  He has no neurologic complaints.  He denies any recent fevers.  He has a fair appetite, but denies weight loss.  He denies any chest pain, shortness of breath, cough, or hemoptysis.  He denies any nausea, vomiting, constipation, or diarrhea.  He has no further melena or hematochezia.  He has no urinary complaints.  [name]  REVIEW OF SYSTEMS:   Review of Systems  Constitutional: Negative.  Negative for fever, malaise/fatigue and weight loss.  Respiratory: Negative.  Negative for cough, hemoptysis and shortness of breath.   Cardiovascular: Negative.  Negative for chest pain and leg swelling.  Gastrointestinal: Negative.  Negative for abdominal pain, blood in stool and melena.  Genitourinary: Negative.  Negative for hematuria.  Musculoskeletal: Negative.  Negative for back pain.  Skin: Negative.  Negative for rash.  Neurological: Negative.  Negative for dizziness, focal weakness, weakness and headaches.  Psychiatric/Behavioral: Negative.  The patient is not nervous/anxious.     As per HPI. Otherwise, a complete review of systems is negative.  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[place])    Morbid obesity ([place])    Sleep apnea     PAST SURGICAL HISTORY: Past Surgical History:  Procedure Laterality Date   COLONOSCOPY WITH PROPOFOL N/A 07/13/2020   Procedure: COLONOSCOPY WITH PROPOFOL;  Surgeon: Lesly Rubenstein, MD;  Location: [facility] ENDOSCOPY;  Service: Endoscopy;  Laterality: N/A;   COLONOSCOPY WITH PROPOFOL N/A 12/23/2020   Procedure: COLONOSCOPY WITH PROPOFOL;  Surgeon: Lucilla Lame, MD;  Location: [facility] ENDOSCOPY;  Service: Endoscopy;  Laterality: N/A;   ESOPHAGOGASTRODUODENOSCOPY (EGD) WITH PROPOFOL N/A 12/23/2020   Procedure: ESOPHAGOGASTRODUODENOSCOPY (EGD) WITH PROPOFOL;  Surgeon: Lucilla Lame, MD;  Location: [facility] ENDOSCOPY;  Service: Endoscopy;  Laterality: N/A;   GIVENS CAPSULE STUDY N/A 12/29/2020   Procedure: GIVENS CAPSULE STUDY;  Surgeon: Lin Landsman, MD;  Location: [facility] ENDOSCOPY;  Service: Gastroenterology;  Laterality: N/A;   TOOTH EXTRACTION      FAMILY HISTORY: Family History  Problem Relation Age of Onset   Benign prostatic hyperplasia Father    Psoriasis Father    Hypertension Mother    Hyperlipidemia Mother    Diabetes Maternal Grandmother    Diabetes Paternal Grandmother     ADVANCED DIRECTIVES (Y/N):  N  HEALTH MAINTENANCE: Social History   Tobacco Use   Smoking status: Never   Smokeless tobacco: Never  Vaping Use   Vaping Use: Never used  Substance Use Topics   Alcohol use: No   Drug use: No     Colonoscopy:  PAP:  Bone density:  Lipid panel:  Allergies  Allergen Reactions   Ampicillin Diarrhea   Bactrim [Sulfamethoxazole-Trimethoprim] Hives  Cephalexin    Claritin [Loratadine] Other (See Comments)    Irritates throat   Penicillins Rash    Current Outpatient Medications  Medication Sig Dispense Refill   acarbose (PRECOSE) 25 MG tablet Take 25 mg by mouth 3 (three) times daily with meals.     amLODipine (NORVASC) 10 MG tablet Take 10 mg by mouth daily.     fenofibrate 160 MG tablet Take  160 mg by mouth daily.     fexofenadine (ALLEGRA) 180 MG tablet Take 180 mg by mouth daily.     fluticasone (FLONASE) 50 MCG/ACT nasal spray Place 2 sprays into both nostrils daily as needed.     glimepiride (AMARYL) 4 MG tablet Take 4 mg by mouth daily with breakfast.     HIBICLENS 4 % external liquid APPLY TOPICALLY DAILY AS NEEDED. TO WASHLEGS AND GROIN AREA 120 mL 3   latanoprost (XALATAN) 0.005 % ophthalmic solution Apply to eye.     lidocaine (LMX) 4 % cream Apply 1 application topically as needed.     lovastatin (MEVACOR) 10 MG tablet Take 10 mg by mouth daily.     metFORMIN (GLUCOPHAGE-XR) 500 MG 24 hr tablet Take 500-1,000 mg by mouth 2 (two) times daily. Takes 1 tablet with breakfast and 2 tablets with supper     metoprolol tartrate (LOPRESSOR) 50 MG tablet Take 50 mg by mouth 2 (two) times daily.     Multiple Vitamins-Minerals (MULTIVITAMIN WITH MINERALS) tablet Take 1 tablet by mouth daily.     mupirocin ointment (BACTROBAN) 2 % Apply to boils and ulcers QD PRN flares. 22 g 3   omeprazole (PRILOSEC) 20 MG capsule Take 20 mg by mouth daily.     pioglitazone (ACTOS) 30 MG tablet Take 30 mg by mouth daily.     ramipril (ALTACE) 10 MG capsule Take 10 mg by mouth 2 (two) times daily.     spironolactone (ALDACTONE) 50 MG tablet Take 50 mg by mouth daily.     terazosin (HYTRIN) 10 MG capsule Take 10 mg by mouth daily.      torsemide (DEMADEX) 20 MG tablet Take 20 mg by mouth daily.     traZODone (DESYREL) 150 MG tablet Take 150 mg by mouth at bedtime.     TRESIBA FLEXTOUCH 200 UNIT/ML FlexTouch Pen Inject 48 Units into the skin in the morning. Taking 100 units of insulin     vitamin B-12 (CYANOCOBALAMIN) 1000 MCG tablet Take 1,000 mcg by mouth daily.     Hydrocortisone-Aloe 1 % CREA Apply 1 application topically 2 (two) times daily as needed (itching). To itchy area on legs (Patient not taking: Reported on 11/16/2021)     ketoconazole (NIZORAL) 2 % cream Apply to rash in groin once a day as  needed for flares. (Patient not taking: Reported on 11/16/2021) 60 g 11   trolamine salicylate (ASPERCREME) 10 % cream Apply 1 application topically as needed for muscle pain. (Patient not taking: Reported on 11/16/2021)     No current facility-administered medications for this visit.    OBJECTIVE: Vitals:   02/07/22 0840  BP: (!) 156/49  Pulse: 84  Resp: 18  Temp: (!) 97.4 F (36.3 C)  SpO2: 98%      Body mass index is 51.37 kg/m.    ECOG FS:0 - Asymptomatic  General: Well-developed, well-nourished, no acute distress. Eyes: Pink conjunctiva, anicteric sclera. HEENT: Normocephalic, moist mucous membranes. Lungs: No audible wheezing or coughing. Heart: Regular rate and rhythm. Abdomen: Soft, nontender, no obvious distention.  Musculoskeletal: No edema, cyanosis, or clubbing. Neuro: Alert, answering all questions appropriately. Cranial nerves grossly intact. Skin: No rashes or petechiae noted. Psych: Normal affect.  LAB RESULTS:  Lab Results  Component Value Date   NA 135 01/31/2022   K 4.4 01/31/2022   CL 105 01/31/2022   CO2 21 (L) 01/31/2022   GLUCOSE 220 (H) 01/31/2022   BUN 38 (H) 01/31/2022   CREATININE 1.04 01/31/2022   CALCIUM 9.0 01/31/2022   PROT 7.5 01/31/2022   ALBUMIN 4.1 01/31/2022   AST 33 01/31/2022   ALT 33 01/31/2022   ALKPHOS 41 01/31/2022   BILITOT 0.5 01/31/2022   GFRNONAA >60 01/31/2022   GFRAA >60 10/25/2016    Lab Results  Component Value Date   WBC 3.3 (L) 01/31/2022   NEUTROABS 2.7 01/31/2022   HGB 10.8 (L) 02/07/2022   HCT 30.0 (L) 01/31/2022   MCV 97.7 01/31/2022   PLT 268 01/31/2022     STUDIES: No results found.  ASSESSMENT:Idiopathic cytopenia of undetermined significance.  PLAN:    Idiopathic cytopenia of undetermined significance: Bone marrow biopsy on January 03, 2021 revealed a hypercellular marrow with erythroid hyperplasia and dyserythropoiesis.  Patient initially thought to have MDS, but second opinion from [facility]  did not feel his bone marrow results supported this diagnosis.  No blasts, increased plasma cells, or clonality was noted.  Cytogenetics were reported as normal.  PET scan results from January 10, 2022 reviewed independently with no obvious evidence of underlying hypermetabolism suggestive of malignancy.  Patient has not required a blood transfusion since June 17, 2021. Recommendation from [facility] is to pursue Rituxan once weekly for 4 weeks to which patient has agreed.  Patient last received Aranesp on January 31, 2022.  Proceed with cycle 1 of 4 weekly Rituxan today.  Return to clinic in 1 week for further evaluation and consideration of cycle 2.  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  Recent and repeat IgA levels were undetectable with a normal IgM and IgG.  Patient also has anti-IgA antibodies.  Patients with isolated IgA deficiency can have anaphylactoid reactions to blood transfusions.   Shortness of breath: Patient does not complain of this today.  Follow-up with pulmonary as indicated. Positive ANA: Patient noted to have a positive and ANA at his work-up at [facility].  The clinical significance of this is unclear. Leukopenia: Chronic and unchanged.   Splenomegaly: Mild.  PET scan results as above.  Possibly related to underlying NASH.   NASH: Patient reports he is being evaluated at [place] clinic. Hyperglycemia: Continue follow-up and treatment per primary care.   Patient expressed understanding and was in agreement with this plan. He also understands that He can call clinic at any time with any questions, concerns, or complaints.    Lloyd Huger, MD   02/07/2022 8:59 AM

## 2022-02-14 ENCOUNTER — Encounter: Payer: Self-pay | Admitting: Oncology

## 2022-02-14 ENCOUNTER — Inpatient Hospital Stay: Payer: Medicare HMO

## 2022-02-14 ENCOUNTER — Other Ambulatory Visit: Payer: Self-pay

## 2022-02-14 ENCOUNTER — Inpatient Hospital Stay (HOSPITAL_BASED_OUTPATIENT_CLINIC_OR_DEPARTMENT_OTHER): Payer: Medicare HMO | Admitting: Oncology

## 2022-02-14 ENCOUNTER — Other Ambulatory Visit: Payer: Self-pay | Admitting: *Deleted

## 2022-02-14 VITALS — BP 130/54 | HR 71 | Temp 98.9°F | Resp 17

## 2022-02-14 DIAGNOSIS — D759 Disease of blood and blood-forming organs, unspecified: Secondary | ICD-10-CM

## 2022-02-14 DIAGNOSIS — Z5112 Encounter for antineoplastic immunotherapy: Secondary | ICD-10-CM | POA: Diagnosis not present

## 2022-02-14 LAB — CBC WITH DIFFERENTIAL/PLATELET
Abs Immature Granulocytes: 0.02 10*3/uL (ref 0.00–0.07)
Basophils Absolute: 0 10*3/uL (ref 0.0–0.1)
Basophils Relative: 1 %
Eosinophils Absolute: 0 10*3/uL (ref 0.0–0.5)
Eosinophils Relative: 0 %
HCT: 31.5 % — ABNORMAL LOW (ref 39.0–52.0)
Hemoglobin: 10.8 g/dL — ABNORMAL LOW (ref 13.0–17.0)
Immature Granulocytes: 1 %
Lymphocytes Relative: 4 %
Lymphs Abs: 0.1 10*3/uL — ABNORMAL LOW (ref 0.7–4.0)
MCH: 33.5 pg (ref 26.0–34.0)
MCHC: 34.3 g/dL (ref 30.0–36.0)
MCV: 97.8 fL (ref 80.0–100.0)
Monocytes Absolute: 0.4 10*3/uL (ref 0.1–1.0)
Monocytes Relative: 11 %
Neutro Abs: 2.8 10*3/uL (ref 1.7–7.7)
Neutrophils Relative %: 83 %
Platelets: 266 10*3/uL (ref 150–400)
RBC: 3.22 MIL/uL — ABNORMAL LOW (ref 4.22–5.81)
RDW: 14.1 % (ref 11.5–15.5)
WBC: 3.3 10*3/uL — ABNORMAL LOW (ref 4.0–10.5)
nRBC: 0 % (ref 0.0–0.2)

## 2022-02-14 LAB — SAMPLE TO BLOOD BANK

## 2022-02-14 LAB — HEMOGLOBIN: Hemoglobin: 10.5 g/dL — ABNORMAL LOW (ref 13.0–17.0)

## 2022-02-14 MED ORDER — DIPHENHYDRAMINE HCL 25 MG PO CAPS
50.0000 mg | ORAL_CAPSULE | Freq: Once | ORAL | Status: AC
  Administered 2022-02-14: 50 mg via ORAL
  Filled 2022-02-14: qty 2

## 2022-02-14 MED ORDER — ACETAMINOPHEN 325 MG PO TABS
650.0000 mg | ORAL_TABLET | Freq: Once | ORAL | Status: AC
  Administered 2022-02-14: 650 mg via ORAL
  Filled 2022-02-14: qty 2

## 2022-02-14 MED ORDER — SODIUM CHLORIDE 0.9 % IV SOLN
Freq: Once | INTRAVENOUS | Status: AC
  Filled 2022-02-14: qty 250

## 2022-02-14 MED ORDER — SODIUM CHLORIDE 0.9 % IV SOLN
375.0000 mg/m2 | Freq: Once | INTRAVENOUS | Status: AC
  Administered 2022-02-14: 1000 mg via INTRAVENOUS
  Filled 2022-02-14: qty 100

## 2022-02-14 NOTE — Patient Instructions (Addendum)
Rituximab Injection What is this medication? RITUXIMAB (ri TUX i mab) treats leukemia and lymphoma. It works by blocking a protein that causes cancer cells to grow and multiply. This helps to slow or stop the spread of cancer cells. It may also be used to treat autoimmune conditions, such as arthritis. It works by slowing down an overactive immune system. It is a monoclonal antibody. This medicine may be used for other purposes; ask your health care provider or pharmacist if you have questions. COMMON BRAND NAME(S): RIABNI, Rituxan, RUXIENCE, truxima What should I tell my care team before I take this medication? They need to know if you have any of these conditions: Chest pain Heart disease Immune system problems Infection, such as chickenpox, cold sores, hepatitis B, herpes Irregular heartbeat or rhythm Kidney disease Low blood counts, such as low white cells, platelets, red cells Lung disease Recent or upcoming vaccine An unusual or allergic reaction to rituximab, other medications, foods, dyes, or preservatives Pregnant or trying to get pregnant Breast-feeding How should I use this medication? This medication is injected into a vein. It is given by a care team in a hospital or clinic setting. A special MedGuide will be given to you before each treatment. Be sure to read this information carefully each time. Talk to your care team about the use of this medication in children. While this medication may be prescribed for children as young as 6 months for selected conditions, precautions do apply. Overdosage: If you think you have taken too much of this medicine contact a poison control center or emergency room at once. NOTE: This medicine is only for you. Do not share this medicine with others. What if I miss a dose? Keep appointments for follow-up doses. It is important not to miss your dose. Call your care team if you are unable to keep an appointment. What may interact with this  medication? Do not take this medication with any of the following: Live vaccines This medication may also interact with the following: Cisplatin This list may not describe all possible interactions. Give your health care provider a list of all the medicines, herbs, non-prescription drugs, or dietary supplements you use. Also tell them if you smoke, drink alcohol, or use illegal drugs. Some items may interact with your medicine. What should I watch for while using this medication? Your condition will be monitored carefully while you are receiving this medication. You may need blood work while taking this medication. This medication can cause serious infusion reactions. To reduce the risk your care team may give you other medications to take before receiving this one. Be sure to follow the directions from your care team. This medication may increase your risk of getting an infection. Call your care team for advice if you get a fever, chills, sore throat, or other symptoms of a cold or flu. Do not treat yourself. Try to avoid being around people who are sick. Call your care team if you are around anyone with measles, chickenpox, or if you develop sores or blisters that do not heal properly. Avoid taking medications that contain aspirin, acetaminophen, ibuprofen, naproxen, or ketoprofen unless instructed by your care team. These medications may hide a fever. This medication may cause serious skin reactions. They can happen weeks to months after starting the medication. Contact your care team right away if you notice fevers or flu-like symptoms with a rash. The rash may be red or purple and then turn into blisters or peeling of the skin.   You may also notice a red rash with swelling of the face, lips, or lymph nodes in your neck or under your arms. In some patients, this medication may cause a serious brain infection that may cause death. If you have any problems seeing, thinking, speaking, walking, or  standing, tell your care team right away. If you cannot reach your care team, urgently seek another source of medical care. Talk to your care team if you may be pregnant. Serious birth defects can occur if you take this medication during pregnancy and for 12 months after the last dose. You will need a negative pregnancy test before starting this medication. Contraception is recommended while taking this medication and for 12 months after the last dose. Your care team can help you find the option that works for you. Do not breastfeed while taking this medication and for at least 6 months after the last dose. What side effects may I notice from receiving this medication? Side effects that you should report to your care team as soon as possible: Allergic reactions or angioedema--skin rash, itching or hives, swelling of the face, eyes, lips, tongue, arms, or legs, trouble swallowing or breathing Bowel blockage--stomach cramping, unable to have a bowel movement or pass gas, loss of appetite, vomiting Dizziness, loss of balance or coordination, confusion or trouble speaking Heart attack--pain or tightness in the chest, shoulders, arms, or jaw, nausea, shortness of breath, cold or clammy skin, feeling faint or lightheaded Heart rhythm changes--fast or irregular heartbeat, dizziness, feeling faint or lightheaded, chest pain, trouble breathing Infection--fever, chills, cough, sore throat, wounds that don't heal, pain or trouble when passing urine, general feeling of discomfort or being unwell Infusion reactions--chest pain, shortness of breath or trouble breathing, feeling faint or lightheaded Kidney injury--decrease in the amount of urine, swelling of the ankles, hands, or feet Liver injury--right upper belly pain, loss of appetite, nausea, light-colored stool, dark yellow or brown urine, yellowing skin or eyes, unusual weakness or fatigue Redness, blistering, peeling, or loosening of the skin, including  inside the mouth Stomach pain that is severe, does not go away, or gets worse Tumor lysis syndrome (TLS)--nausea, vomiting, diarrhea, decrease in the amount of urine, dark urine, unusual weakness or fatigue, confusion, muscle pain or cramps, fast or irregular heartbeat, joint pain Side effects that usually do not require medical attention (report to your care team if they continue or are bothersome): Headache Joint pain Nausea Runny or stuffy nose Unusual weakness or fatigue This list may not describe all possible side effects. Call your doctor for medical advice about side effects. You may report side effects to FDA at 1-800-FDA-1088. Where should I keep my medication? This medication is given in a hospital or clinic. It will not be stored at home. NOTE: This sheet is a summary. It may not cover all possible information. If you have questions about this medicine, talk to your doctor, pharmacist, or health care provider.  2023 Elsevier/Gold Standard (2021-05-17 00:00:00)  

## 2022-02-14 NOTE — Progress Notes (Signed)
[place]  [phone] Fax:(336) [phone]  ID: Patrick Duncan OB: [birth year]  MR#: [id number]  VEH#:[id number]  Patient Care Team: Sofie Hartigan, MD as PCP - General (Family Medicine) Lloyd Huger, MD as Consulting Physician (Oncology)  CHIEF COMPLAINT: Idiopathic cytopenia of undetermined significance.    INTERVAL HISTORY: Patient returns to clinic today for further evaluation and consideration of cycle 2 of 4 weekly Rituxan.  He tolerated his first infusion well without significant side effects.  He currently feels well and is asymptomatic.  He denies any weakness or fatigue.  He has no neurologic complaints.  He denies any recent fevers.  He has a fair appetite, but denies weight loss.  He denies any chest pain, shortness of breath, cough, or hemoptysis.  He denies any nausea, vomiting, constipation, or diarrhea.  He has no further melena or hematochezia.  He has no urinary complaints.  [name]  REVIEW OF SYSTEMS:   Review of Systems  Constitutional: Negative.  Negative for fever, malaise/fatigue and weight loss.  Respiratory: Negative.  Negative for cough, hemoptysis and shortness of breath.   Cardiovascular: Negative.  Negative for chest pain and leg swelling.  Gastrointestinal: Negative.  Negative for abdominal pain, blood in stool and melena.  Genitourinary: Negative.  Negative for hematuria.  Musculoskeletal: Negative.  Negative for back pain.  Skin: Negative.  Negative for rash.  Neurological: Negative.  Negative for dizziness, focal weakness, weakness and headaches.  Psychiatric/Behavioral: Negative.  The patient is not nervous/anxious.     As per HPI. Otherwise, a complete review of systems is negative.  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[place])    Morbid obesity ([place])    Sleep apnea     PAST SURGICAL HISTORY: Past Surgical History:  Procedure Laterality Date   COLONOSCOPY WITH PROPOFOL N/A 07/13/2020   Procedure: COLONOSCOPY WITH PROPOFOL;  Surgeon: Lesly Rubenstein, MD;  Location: [facility] ENDOSCOPY;  Service: Endoscopy;  Laterality: N/A;   COLONOSCOPY WITH PROPOFOL N/A 12/23/2020   Procedure: COLONOSCOPY WITH PROPOFOL;  Surgeon: Lucilla Lame, MD;  Location: [facility] ENDOSCOPY;  Service: Endoscopy;  Laterality: N/A;   ESOPHAGOGASTRODUODENOSCOPY (EGD) WITH PROPOFOL N/A 12/23/2020   Procedure: ESOPHAGOGASTRODUODENOSCOPY (EGD) WITH PROPOFOL;  Surgeon: Lucilla Lame, MD;  Location: [facility] ENDOSCOPY;  Service: Endoscopy;  Laterality: N/A;   GIVENS CAPSULE STUDY N/A 12/29/2020   Procedure: GIVENS CAPSULE STUDY;  Surgeon: Lin Landsman, MD;  Location: [facility] ENDOSCOPY;  Service: Gastroenterology;  Laterality: N/A;   TOOTH EXTRACTION      FAMILY HISTORY: Family History  Problem Relation Age of Onset   Benign prostatic hyperplasia Father    Psoriasis Father    Hypertension Mother    Hyperlipidemia Mother    Diabetes Maternal Grandmother    Diabetes Paternal Grandmother     ADVANCED DIRECTIVES (Y/N):  N  HEALTH MAINTENANCE: Social History   Tobacco Use   Smoking status: Never   Smokeless tobacco: Never  Vaping Use   Vaping Use: Never used  Substance Use Topics   Alcohol use: No   Drug use: No     Colonoscopy:  PAP:  Bone density:  Lipid panel:  Allergies  Allergen  Reactions   Ampicillin Diarrhea   Bactrim [Sulfamethoxazole-Trimethoprim] Hives   Cephalexin    Claritin [Loratadine] Other (See Comments)    Irritates throat   Penicillins Rash    Current Outpatient Medications  Medication Sig Dispense Refill   acarbose (PRECOSE) 25 MG tablet Take 25 mg by mouth 3 (three) times daily with meals.     amLODipine (NORVASC) 10 MG  tablet Take 10 mg by mouth daily.     fenofibrate 160 MG tablet Take 160 mg by mouth daily.     fexofenadine (ALLEGRA) 180 MG tablet Take 180 mg by mouth daily.     fluticasone (FLONASE) 50 MCG/ACT nasal spray Place 2 sprays into both nostrils daily as needed.     glimepiride (AMARYL) 4 MG tablet Take 4 mg by mouth daily with breakfast.     HIBICLENS 4 % external liquid APPLY TOPICALLY DAILY AS NEEDED. TO WASHLEGS AND GROIN AREA 120 mL 3   latanoprost (XALATAN) 0.005 % ophthalmic solution Apply to eye.     lidocaine (LMX) 4 % cream Apply 1 application topically as needed.     lovastatin (MEVACOR) 10 MG tablet Take 10 mg by mouth daily.     metFORMIN (GLUCOPHAGE-XR) 500 MG 24 hr tablet Take 500-1,000 mg by mouth 2 (two) times daily. Takes 1 tablet with breakfast and 2 tablets with supper     metoprolol tartrate (LOPRESSOR) 50 MG tablet Take 50 mg by mouth 2 (two) times daily.     Multiple Vitamins-Minerals (MULTIVITAMIN WITH MINERALS) tablet Take 1 tablet by mouth daily.     mupirocin ointment (BACTROBAN) 2 % Apply to boils and ulcers QD PRN flares. 22 g 3   omeprazole (PRILOSEC) 20 MG capsule Take 20 mg by mouth daily.     pioglitazone (ACTOS) 30 MG tablet Take 30 mg by mouth daily.     ramipril (ALTACE) 10 MG capsule Take 10 mg by mouth 2 (two) times daily.     spironolactone (ALDACTONE) 50 MG tablet Take 50 mg by mouth daily.     terazosin (HYTRIN) 10 MG capsule Take 10 mg by mouth daily.      torsemide (DEMADEX) 20 MG tablet Take 20 mg by mouth daily.     traZODone (DESYREL) 150 MG tablet Take 150 mg by mouth at bedtime.     TRESIBA FLEXTOUCH 200 UNIT/ML FlexTouch Pen Inject 48 Units into the skin in the morning. Taking 100 units of insulin     vitamin B-12 (CYANOCOBALAMIN) 1000 MCG tablet Take 1,000 mcg by mouth daily.     Hydrocortisone-Aloe 1 % CREA Apply 1 application topically 2 (two) times daily as needed (itching). To itchy area on legs (Patient not taking: Reported on 11/16/2021)      ketoconazole (NIZORAL) 2 % cream Apply to rash in groin once a day as needed for flares. (Patient not taking: Reported on 11/16/2021) 60 g 11   trolamine salicylate (ASPERCREME) 10 % cream Apply 1 application topically as needed for muscle pain. (Patient not taking: Reported on 11/16/2021)     No current facility-administered medications for this visit.   Facility-Administered Medications Ordered in Other Visits  Medication Dose Route Frequency Provider Last Rate Last Admin   riTUXimab (RITUXAN) 1,000 mg in sodium chloride 0.9 % 250 mL (2.8571 mg/mL) infusion  375 mg/m2 (Treatment Plan Recorded) Intravenous Once Lloyd Huger, MD        OBJECTIVE: Vitals:   02/14/22 0845  BP: (!) 144/51  Pulse: 65  Resp: 20  Temp: 97.9 F (36.6 C)  SpO2: 100%      Body mass index is 51.69 kg/m.    ECOG FS:0 - Asymptomatic  General: Well-developed, well-nourished, no acute distress. Eyes: Pink conjunctiva, anicteric sclera. HEENT: Normocephalic, moist mucous membranes. Lungs: No audible wheezing or coughing. Heart: Regular rate and rhythm. Abdomen: Soft, nontender, no obvious distention. Musculoskeletal: No edema, cyanosis, or clubbing. Neuro: Alert, answering all questions appropriately. Cranial nerves grossly intact. Skin: No rashes or petechiae noted. Psych: Normal affect.  LAB RESULTS:  Lab Results  Component Value Date   NA 135 01/31/2022   K 4.4 01/31/2022   CL 105 01/31/2022   CO2 21 (L) 01/31/2022   GLUCOSE 220 (H) 01/31/2022   BUN 38 (H) 01/31/2022   CREATININE 1.04 01/31/2022   CALCIUM 9.0 01/31/2022   PROT 7.5 01/31/2022   ALBUMIN 4.1 01/31/2022   AST 33 01/31/2022   ALT 33 01/31/2022   ALKPHOS 41 01/31/2022   BILITOT 0.5 01/31/2022   GFRNONAA >60 01/31/2022   GFRAA >60 10/25/2016    Lab Results  Component Value Date   WBC 3.3 (L) 01/31/2022   NEUTROABS 2.7 01/31/2022   HGB 10.5 (L) 02/14/2022   HCT 30.0 (L) 01/31/2022   MCV 97.7 01/31/2022   PLT 268  01/31/2022     STUDIES: No results found.  ASSESSMENT:Idiopathic cytopenia of undetermined significance.  PLAN:    Idiopathic cytopenia of undetermined significance: Bone marrow biopsy on January 03, 2021 revealed a hypercellular marrow with erythroid hyperplasia and dyserythropoiesis.  Patient initially thought to have MDS, but second opinion from [facility] did not feel his bone marrow results supported this diagnosis.  No blasts, increased plasma cells, or clonality was noted.  Cytogenetics were reported as normal.  PET scan results from January 10, 2022 reviewed independently with no obvious evidence of underlying hypermetabolism suggestive of malignancy.  Patient has not required a blood transfusion since June 17, 2021. Recommendation from [facility] is to pursue Rituxan once weekly for 4 weeks to which patient has agreed.  Patient last received Aranesp on January 31, 2022.  Proceed with cycle 2 of weekly Rituxan today.  Return to clinic in 1 week for further evaluation and consideration of cycle 3.   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  Recent and repeat IgA levels were undetectable with a normal IgM and IgG.  Patient also has anti-IgA antibodies.  Patients with isolated IgA deficiency can have anaphylactoid reactions to blood transfusions, therefor he needs matched and washed packed red blood cells.   Shortness of breath: Patient does not complain of this today.  Follow-up with pulmonary as indicated. Positive ANA: Patient noted to have a positive and ANA at his work-up at [facility].  The clinical significance of this is unclear. Leukopenia: Chronic and unchanged. Splenomegaly: Mild.  PET scan results as above.  Possibly related to underlying NASH.   NASH: Patient reports he is being evaluated at [place] [facility]. Hyperglycemia: Chronic and unchanged.  Continue follow-up and treatment per primary care.  I spent a total of 30  minutes reviewing chart data, face-to-face evaluation with the patient, counseling and coordination of care as detailed above.    Patient expressed understanding and was in agreement with this plan. He also understands that He can call clinic at any time with any questions, concerns, or complaints.    Lloyd Huger, MD   02/14/2022 9:34 AM

## 2022-02-21 ENCOUNTER — Inpatient Hospital Stay: Payer: Medicare HMO | Attending: Oncology

## 2022-02-21 ENCOUNTER — Inpatient Hospital Stay: Payer: Medicare HMO | Admitting: Oncology

## 2022-02-21 ENCOUNTER — Encounter: Payer: Self-pay | Admitting: Nurse Practitioner

## 2022-02-21 ENCOUNTER — Inpatient Hospital Stay: Payer: Medicare HMO

## 2022-02-21 ENCOUNTER — Encounter: Payer: Self-pay | Admitting: Oncology

## 2022-02-21 ENCOUNTER — Inpatient Hospital Stay (HOSPITAL_BASED_OUTPATIENT_CLINIC_OR_DEPARTMENT_OTHER): Payer: Medicare HMO | Admitting: Nurse Practitioner

## 2022-02-21 VITALS — BP 132/50 | HR 73 | Temp 97.4°F | Ht 67.0 in | Wt 330.0 lb

## 2022-02-21 VITALS — BP 119/49 | HR 68 | Resp 18

## 2022-02-21 DIAGNOSIS — Z5112 Encounter for antineoplastic immunotherapy: Secondary | ICD-10-CM | POA: Diagnosis present

## 2022-02-21 DIAGNOSIS — D72819 Decreased white blood cell count, unspecified: Secondary | ICD-10-CM | POA: Diagnosis not present

## 2022-02-21 DIAGNOSIS — Z79620 Long term (current) use of immunosuppressive biologic: Secondary | ICD-10-CM

## 2022-02-21 DIAGNOSIS — Z5181 Encounter for therapeutic drug level monitoring: Secondary | ICD-10-CM

## 2022-02-21 DIAGNOSIS — D759 Disease of blood and blood-forming organs, unspecified: Secondary | ICD-10-CM | POA: Insufficient documentation

## 2022-02-21 DIAGNOSIS — D649 Anemia, unspecified: Secondary | ICD-10-CM | POA: Diagnosis not present

## 2022-02-21 DIAGNOSIS — R161 Splenomegaly, not elsewhere classified: Secondary | ICD-10-CM | POA: Diagnosis not present

## 2022-02-21 DIAGNOSIS — R0602 Shortness of breath: Secondary | ICD-10-CM | POA: Diagnosis not present

## 2022-02-21 DIAGNOSIS — D802 Selective deficiency of immunoglobulin A [IgA]: Secondary | ICD-10-CM | POA: Insufficient documentation

## 2022-02-21 DIAGNOSIS — E1165 Type 2 diabetes mellitus with hyperglycemia: Secondary | ICD-10-CM | POA: Insufficient documentation

## 2022-02-21 LAB — CBC WITH DIFFERENTIAL/PLATELET
Abs Immature Granulocytes: 0.03 10*3/uL (ref 0.00–0.07)
Basophils Absolute: 0 10*3/uL (ref 0.0–0.1)
Basophils Relative: 0 %
Eosinophils Absolute: 0 10*3/uL (ref 0.0–0.5)
Eosinophils Relative: 0 %
HCT: 30.4 % — ABNORMAL LOW (ref 39.0–52.0)
Hemoglobin: 10.4 g/dL — ABNORMAL LOW (ref 13.0–17.0)
Immature Granulocytes: 1 %
Lymphocytes Relative: 4 %
Lymphs Abs: 0.2 10*3/uL — ABNORMAL LOW (ref 0.7–4.0)
MCH: 33.1 pg (ref 26.0–34.0)
MCHC: 34.2 g/dL (ref 30.0–36.0)
MCV: 96.8 fL (ref 80.0–100.0)
Monocytes Absolute: 0.3 10*3/uL (ref 0.1–1.0)
Monocytes Relative: 9 %
Neutro Abs: 3 10*3/uL (ref 1.7–7.7)
Neutrophils Relative %: 86 %
Platelets: 239 10*3/uL (ref 150–400)
RBC: 3.14 MIL/uL — ABNORMAL LOW (ref 4.22–5.81)
RDW: 13.8 % (ref 11.5–15.5)
WBC: 3.5 10*3/uL — ABNORMAL LOW (ref 4.0–10.5)
nRBC: 0 % (ref 0.0–0.2)

## 2022-02-21 LAB — COMPREHENSIVE METABOLIC PANEL
ALT: 32 U/L (ref 0–44)
AST: 31 U/L (ref 15–41)
Albumin: 3.9 g/dL (ref 3.5–5.0)
Alkaline Phosphatase: 36 U/L — ABNORMAL LOW (ref 38–126)
Anion gap: 10 (ref 5–15)
BUN: 33 mg/dL — ABNORMAL HIGH (ref 6–20)
CO2: 21 mmol/L — ABNORMAL LOW (ref 22–32)
Calcium: 8.7 mg/dL — ABNORMAL LOW (ref 8.9–10.3)
Chloride: 102 mmol/L (ref 98–111)
Creatinine, Ser: 0.95 mg/dL (ref 0.61–1.24)
GFR, Estimated: >60 mL/min (ref >60)
Glucose, Bld: 270 mg/dL — ABNORMAL HIGH (ref 70–99)
Potassium: 4 mmol/L (ref 3.5–5.1)
Sodium: 133 mmol/L — ABNORMAL LOW (ref 135–145)
Total Bilirubin: 0.7 mg/dL (ref 0.3–1.2)
Total Protein: 6.9 g/dL (ref 6.5–8.1)

## 2022-02-21 LAB — SAMPLE TO BLOOD BANK

## 2022-02-21 MED ORDER — DARBEPOETIN ALFA 500 MCG/ML IJ SOSY: 500.0000 ug | PREFILLED_SYRINGE | Freq: Once | INTRAMUSCULAR | Status: DC

## 2022-02-21 MED ORDER — SODIUM CHLORIDE 0.9 % IV SOLN
Freq: Once | INTRAVENOUS | Status: AC
  Filled 2022-02-21: qty 250

## 2022-02-21 MED ORDER — SODIUM CHLORIDE 0.9 % IV SOLN
375.0000 mg/m2 | Freq: Once | INTRAVENOUS | Status: AC
  Administered 2022-02-21: 1000 mg via INTRAVENOUS
  Filled 2022-02-21: qty 100

## 2022-02-21 MED ORDER — HEPARIN SOD (PORK) LOCK FLUSH 100 UNIT/ML IV SOLN
500.0000 [IU] | Freq: Once | INTRAVENOUS | Status: DC | PRN
  Filled 2022-02-21: qty 5

## 2022-02-21 MED ORDER — ACETAMINOPHEN 325 MG PO TABS
650.0000 mg | ORAL_TABLET | Freq: Once | ORAL | Status: AC
  Administered 2022-02-21: 650 mg via ORAL
  Filled 2022-02-21: qty 2

## 2022-02-21 MED ORDER — DIPHENHYDRAMINE HCL 25 MG PO CAPS
50.0000 mg | ORAL_CAPSULE | Freq: Once | ORAL | Status: AC
  Administered 2022-02-21: 50 mg via ORAL
  Filled 2022-02-21: qty 2

## 2022-02-21 NOTE — Progress Notes (Signed)
[place]  Telephone:(336) [phone] Fax:(336) [phone]  ID: Patrick Duncan OB: 1965-05-14  MR#: [id number]  CWC#:[id number]  Patient Care Team: Sofie Hartigan, MD as PCP - General (Family Medicine) Lloyd Huger, MD as Consulting Physician (Oncology)  CHIEF COMPLAINT: Idiopathic cytopenia of undetermined significance.    INTERVAL HISTORY: [name]  He tolerated his first infusion well without significant side effects.  He currently feels well and is asymptomatic. He had elevated blood sugar this morning after eating pre-packaged oatmeal this morning. He denies any weakness or fatigue.  He has no neurologic complaints.  He denies any recent fevers.  He has a fair appetite, but denies weight loss.  He denies any chest pain, shortness of breath, cough, or hemoptysis.  He denies any nausea, vomiting, constipation, or diarrhea.  He has no further melena or hematochezia.  He has no urinary complaints.  [name]  REVIEW OF SYSTEMS:   Review of Systems  Constitutional: Negative.  Negative for fever, malaise/fatigue and weight loss.  Respiratory: Negative.  Negative for cough, hemoptysis and shortness of breath.   Cardiovascular: Negative.  Negative for chest pain and leg swelling.  Gastrointestinal: Negative.  Negative for abdominal pain, blood in stool, constipation, heartburn, melena, nausea and vomiting.  Genitourinary: Negative.  Negative for frequency, hematuria and urgency.  Musculoskeletal: Negative.  Negative for back pain and falls.  Skin: Negative.  Negative for rash.  Neurological: Negative.  Negative for dizziness, focal weakness, weakness and headaches.  Endo/Heme/Allergies:  Positive for polydipsia. Bruises/bleeds easily.  Psychiatric/Behavioral: Negative.  Negative for depression. The patient is not nervous/anxious.    As per HPI. Otherwise, a complete review of systems is negative.   PAST MEDICAL HISTORY: Past Medical History:  Diagnosis Date  . Arthritis   . Cellulitis and abscess of left leg   . Depression   . Diabetes mellitus without complication ([place])   . Hearing loss   . History of IBS   . HLD (hyperlipidemia)   . Hypertension   . 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 Morbid obesity ([place])   . Sleep apnea     PAST SURGICAL HISTORY: Past Surgical History:  Procedure Laterality Date  . COLONOSCOPY WITH PROPOFOL N/A 07/13/2020   Procedure: COLONOSCOPY WITH PROPOFOL;  Surgeon: Lesly Rubenstein, MD;  [facility] ENDOSCOPY;  Service: Endoscopy;  Laterality: N/A;  . COLONOSCOPY WITH PROPOFOL N/A 12/23/2020   Procedure: COLONOSCOPY WITH PROPOFOL;  Surgeon: Lucilla Lame, MD;  Location: [facility] ENDOSCOPY;  Service: Endoscopy;  Laterality: N/A;  . ESOPHAGOGASTRODUODENOSCOPY (EGD) WITH PROPOFOL N/A 12/23/2020   Procedure: ESOPHAGOGASTRODUODENOSCOPY (EGD) WITH PROPOFOL;  Surgeon: Lucilla Lame, MD;  Location: [facility] ENDOSCOPY;  Service: Endoscopy;  Laterality: N/A;  . GIVENS CAPSULE STUDY N/A 12/29/2020   Procedure: GIVENS CAPSULE STUDY;  Surgeon: Lin Landsman, MD;  Location: [facility] ENDOSCOPY;  Service: Gastroenterology;  Laterality: N/A;  . TOOTH EXTRACTION      FAMILY HISTORY: Family History  Problem Relation Age of Onset  . Benign prostatic hyperplasia Father   . Psoriasis Father   . Hypertension Mother   . Hyperlipidemia Mother   . Diabetes Maternal Grandmother   . Diabetes Paternal Grandmother     ADVANCED DIRECTIVES (Y/N):  N  HEALTH MAINTENANCE: Social History   Tobacco Use  . Smoking status: Never  . Smokeless tobacco: Never  Vaping Use  . Vaping Use: Never used  Substance Use Topics  . Alcohol use: No  . Drug use: No     Colonoscopy:  PAP:  Bone density:  Lipid panel:  Allergies  Allergen Reactions  . Ampicillin Diarrhea  . Bactrim [Sulfamethoxazole-Trimethoprim] Hives   . Cephalexin   . Claritin [Loratadine] Other (See Comments)    Irritates throat  . Penicillins Rash    Current Outpatient Medications  Medication Sig Dispense Refill  . acarbose (PRECOSE) 25 MG tablet Take 25 mg by mouth 3 (three) times daily with meals.    Marland Kitchen amLODipine (NORVASC) 10 MG tablet Take 10 mg by mouth daily.    . fenofibrate 160 MG tablet Take 160 mg by mouth daily.    . fexofenadine (ALLEGRA) 180 MG tablet Take 180 mg by mouth daily.    . fluticasone (FLONASE) 50 MCG/ACT nasal spray Place 2 sprays into both nostrils daily as needed.    Marland Kitchen glimepiride (AMARYL) 4 MG tablet Take 4 mg by mouth daily with breakfast.    . HIBICLENS 4 % external liquid APPLY TOPICALLY DAILY AS NEEDED. TO WASHLEGS AND GROIN AREA 120 mL 3  . latanoprost (XALATAN) 0.005 % ophthalmic solution Apply to eye.    . lidocaine (LMX) 4 % cream Apply 1 application topically as needed.    . lovastatin (MEVACOR) 10 MG tablet Take 10 mg by mouth daily.    . metFORMIN (GLUCOPHAGE-XR) 500 MG 24 hr tablet Take 500-1,000 mg by mouth 2 (two) times daily. Takes 1 tablet with breakfast and 2 tablets with supper    . metoprolol tartrate (LOPRESSOR) 50 MG tablet Take 50 mg by mouth 2 (two) times daily.    . Multiple Vitamins-Minerals (MULTIVITAMIN WITH MINERALS) tablet Take 1 tablet by mouth daily.    . mupirocin ointment (BACTROBAN) 2 % Apply to boils and ulcers QD PRN flares. 22 g 3  . omeprazole (PRILOSEC) 20 MG capsule Take 20 mg by mouth daily.    . pioglitazone (ACTOS) 30 MG tablet Take 30 mg by mouth daily.    . ramipril (ALTACE) 10 MG capsule Take 10 mg by mouth 2 (two) times daily.    Marland Kitchen spironolactone (ALDACTONE) 50 MG tablet Take 50 mg by mouth daily.    Marland Kitchen terazosin (HYTRIN) 10 MG capsule Take 10 mg by mouth daily.     Marland Kitchen torsemide (DEMADEX) 20 MG tablet Take 20 mg by mouth daily.    . traZODone (DESYREL) 150 MG tablet Take 150 mg by mouth at bedtime.    Tyler Aas FLEXTOUCH 200 UNIT/ML FlexTouch Pen Inject 48  Units into the skin in the morning. Taking 100 units of insulin    . vitamin B-12 (CYANOCOBALAMIN) 1000 MCG tablet Take 1,000 mcg by mouth daily.    . Hydrocortisone-Aloe 1 % CREA Apply 1 application topically 2 (two) times daily as needed (itching). To itchy area on legs (Patient not taking: Reported on 11/16/2021)    . ketoconazole (NIZORAL) 2 % cream Apply to rash in groin once a day as needed for flares. (Patient not taking: Reported on 11/16/2021) 60 g 11  . trolamine salicylate (ASPERCREME) 10 % cream Apply 1 application topically as needed for muscle pain. (Patient not taking: Reported on 11/16/2021)     No current facility-administered medications for this visit.    OBJECTIVE: Vitals:   02/21/22 0836  BP: (!) 132/50  Pulse: 73  Temp: (!) 97.4 F (36.3 C)  SpO2: 98%      Body mass index is 51.69 kg/m.  ECOG FS:0 - Asymptomatic  General: Well-developed, well-nourished, no acute distress. Obese. Accompanied.  Eyes: Pink conjunctiva, anicteric sclera. Lungs: Clear to auscultation bilaterally.  No audible wheezing or coughing Heart: Regular rate and rhythm.  Abdomen: Soft, nontender, nondistended.  Musculoskeletal: No edema, cyanosis, or clubbing. Neuro: Alert, answering all questions appropriately. Cranial nerves grossly intact. Skin: No rashes or petechiae noted. Psych: Normal affect.   LAB RESULTS: Lab Results  Component Value Date   NA 133 (L) 02/21/2022   K 4.0 02/21/2022   CL 102 02/21/2022   CO2 21 (L) 02/21/2022   GLUCOSE 270 (H) 02/21/2022   BUN 33 (H) 02/21/2022   CREATININE 0.95 02/21/2022   CALCIUM 8.7 (L) 02/21/2022   PROT 6.9 02/21/2022   ALBUMIN 3.9 02/21/2022   AST 31 02/21/2022   ALT 32 02/21/2022   ALKPHOS 36 (L) 02/21/2022   BILITOT 0.7 02/21/2022   GFRNONAA >60 02/21/2022   GFRAA >60 10/25/2016    Lab Results  Component Value Date   WBC 3.5 (L) 02/21/2022   NEUTROABS 3.0 02/21/2022   HGB 10.4 (L) 02/21/2022   HCT 30.4 (L) 02/21/2022    MCV 96.8 02/21/2022   PLT 239 02/21/2022     STUDIES: No results found.  ASSESSMENT:Idiopathic cytopenia of undetermined significance.  PLAN:    Idiopathic cytopenia of undetermined significance: Bone marrow biopsy on January 03, 2021 revealed a hypercellular marrow with erythroid hyperplasia and dyserythropoiesis.  Patient initially thought to have MDS, but second opinion from [facility] did not feel his bone marrow results supported this diagnosis.  No blasts, increased plasma cells, or clonality was noted.  Cytogenetics were reported as normal.  PET scan results from January 10, 2022 reviewed independently with no obvious evidence of underlying hypermetabolism suggestive of malignancy.  Patient has not required a blood transfusion since June 17, 2021. Recommendation from [facility] is to pursue Rituxan once weekly for 4 weeks to which patient has agreed.  Patient last received Aranesp on January 31, 2022.  Proceed with cycle 2 of weekly Rituxan today.  Return to clinic in 1 week for further evaluation and consideration of cycle 3.   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  Recent and repeat IgA levels were undetectable with a normal IgM and IgG.  Patient also has anti-IgA antibodies.  Patients with isolated IgA deficiency can have anaphylactoid reactions to blood transfusions, therefor he needs matched and washed packed red blood cells.   Shortness of breath: Patient does not complain of this today.  Follow-up with pulmonary as indicated. Positive ANA: Patient noted to have a positive and ANA at his work-up at [facility].  The clinical significance of this is unclear. Leukopenia: Chronic and unchanged. Splenomegaly: Mild.  PET scan results as above.  Possibly related to underlying NASH.   NASH: Patient reports he is being evaluated at [place] clinic. Hyperglycemia: Chronic and unchanged.  Continue follow-up and treatment per primary  care.  I spent a total of 30 minutes reviewing chart data, face-to-face evaluation with the patient, counseling and coordination of care as detailed above.    Patient expressed understanding and was in agreement with this plan. He also understands that He can call clinic at any time with any questions, concerns, or complaints.    Verlon Au, NP   02/21/2022 8:51 AM

## 2022-02-22 ENCOUNTER — Encounter: Payer: Self-pay | Admitting: Oncology

## 2022-02-27 ENCOUNTER — Other Ambulatory Visit: Payer: Self-pay | Admitting: *Deleted

## 2022-02-27 DIAGNOSIS — D759 Disease of blood and blood-forming organs, unspecified: Secondary | ICD-10-CM

## 2022-02-28 ENCOUNTER — Inpatient Hospital Stay (HOSPITAL_BASED_OUTPATIENT_CLINIC_OR_DEPARTMENT_OTHER): Payer: Medicare HMO | Admitting: Oncology

## 2022-02-28 ENCOUNTER — Encounter: Payer: Self-pay | Admitting: Oncology

## 2022-02-28 ENCOUNTER — Inpatient Hospital Stay: Payer: Medicare HMO

## 2022-02-28 VITALS — BP 114/52 | HR 69 | Temp 97.6°F

## 2022-02-28 DIAGNOSIS — Z5112 Encounter for antineoplastic immunotherapy: Secondary | ICD-10-CM | POA: Diagnosis not present

## 2022-02-28 DIAGNOSIS — D759 Disease of blood and blood-forming organs, unspecified: Secondary | ICD-10-CM

## 2022-02-28 DIAGNOSIS — D802 Selective deficiency of immunoglobulin A [IgA]: Secondary | ICD-10-CM | POA: Diagnosis not present

## 2022-02-28 LAB — SAMPLE TO BLOOD BANK

## 2022-02-28 LAB — COMPREHENSIVE METABOLIC PANEL
ALT: 30 U/L (ref 0–44)
AST: 29 U/L (ref 15–41)
Albumin: 3.9 g/dL (ref 3.5–5.0)
Alkaline Phosphatase: 37 U/L — ABNORMAL LOW (ref 38–126)
Anion gap: 8 (ref 5–15)
BUN: 33 mg/dL — ABNORMAL HIGH (ref 6–20)
CO2: 22 mmol/L (ref 22–32)
Calcium: 8.8 mg/dL — ABNORMAL LOW (ref 8.9–10.3)
Chloride: 104 mmol/L (ref 98–111)
Creatinine, Ser: 0.88 mg/dL (ref 0.61–1.24)
GFR, Estimated: >60 mL/min (ref >60)
Glucose, Bld: 221 mg/dL — ABNORMAL HIGH (ref 70–99)
Potassium: 4.2 mmol/L (ref 3.5–5.1)
Sodium: 134 mmol/L — ABNORMAL LOW (ref 135–145)
Total Bilirubin: 0.7 mg/dL (ref 0.3–1.2)
Total Protein: 7.3 g/dL (ref 6.5–8.1)

## 2022-02-28 LAB — CBC WITH DIFFERENTIAL/PLATELET
Abs Immature Granulocytes: 0.06 10*3/uL (ref 0.00–0.07)
Basophils Absolute: 0 10*3/uL (ref 0.0–0.1)
Basophils Relative: 0 %
Eosinophils Absolute: 0 10*3/uL (ref 0.0–0.5)
Eosinophils Relative: 0 %
HCT: 30.2 % — ABNORMAL LOW (ref 39.0–52.0)
Hemoglobin: 10.4 g/dL — ABNORMAL LOW (ref 13.0–17.0)
Immature Granulocytes: 2 %
Lymphocytes Relative: 4 %
Lymphs Abs: 0.2 10*3/uL — ABNORMAL LOW (ref 0.7–4.0)
MCH: 33.1 pg (ref 26.0–34.0)
MCHC: 34.4 g/dL (ref 30.0–36.0)
MCV: 96.2 fL (ref 80.0–100.0)
Monocytes Absolute: 0.4 10*3/uL (ref 0.1–1.0)
Monocytes Relative: 11 %
Neutro Abs: 2.9 10*3/uL (ref 1.7–7.7)
Neutrophils Relative %: 83 %
Platelets: 256 10*3/uL (ref 150–400)
RBC: 3.14 MIL/uL — ABNORMAL LOW (ref 4.22–5.81)
RDW: 13.7 % (ref 11.5–15.5)
WBC: 3.5 10*3/uL — ABNORMAL LOW (ref 4.0–10.5)
nRBC: 0 % (ref 0.0–0.2)

## 2022-02-28 MED ORDER — SODIUM CHLORIDE 0.9 % IV SOLN
375.0000 mg/m2 | Freq: Once | INTRAVENOUS | Status: AC
  Administered 2022-02-28: 1000 mg via INTRAVENOUS
  Filled 2022-02-28: qty 100

## 2022-02-28 MED ORDER — DIPHENHYDRAMINE HCL 25 MG PO CAPS
50.0000 mg | ORAL_CAPSULE | Freq: Once | ORAL | Status: AC
  Administered 2022-02-28: 50 mg via ORAL
  Filled 2022-02-28: qty 2

## 2022-02-28 MED ORDER — ACETAMINOPHEN 325 MG PO TABS
650.0000 mg | ORAL_TABLET | Freq: Once | ORAL | Status: AC
  Administered 2022-02-28: 650 mg via ORAL
  Filled 2022-02-28: qty 2

## 2022-02-28 MED ORDER — SODIUM CHLORIDE 0.9 % IV SOLN
Freq: Once | INTRAVENOUS | Status: AC
  Filled 2022-02-28: qty 250

## 2022-02-28 NOTE — Progress Notes (Signed)
[place]  Telephone:(336) [phone] Fax:(336) [phone]  ID: Patrick Duncan OB: Jun 22, 1965  MR#: [id number]  [id number]  Patient Care Team: Sofie Hartigan, MD as PCP - General (Family Medicine) Lloyd Huger, MD as Consulting Physician (Oncology)  CHIEF COMPLAINT: Idiopathic cytopenia of undetermined significance.    INTERVAL HISTORY: Patient returns to clinic today for further evaluation and consideration of cycle 4 of 4 weekly Rituxan.  Patrick Duncan continues to feel well and remains asymptomatic. Patrick Duncan denies any weakness or fatigue.  Patrick Duncan has no neurologic complaints.  Patrick Duncan denies any recent fevers.  Patrick Duncan has a fair appetite, but denies weight loss.  Patrick Duncan denies any chest pain, shortness of breath, cough, or hemoptysis.  Patrick Duncan denies any nausea, vomiting, constipation, or diarrhea.  Patrick Duncan has no melena or hematochezia.  Patrick Duncan has no urinary complaints.  Patient offers no further specific complaints today.  REVIEW OF SYSTEMS:   Review of Systems  Constitutional: Negative.  Negative for fever, malaise/fatigue and weight loss.  Respiratory: Negative.  Negative for cough, hemoptysis and shortness of breath.   Cardiovascular: Negative.  Negative for chest pain and leg swelling.  Gastrointestinal: Negative.  Negative for abdominal pain, blood in stool and melena.  Genitourinary: Negative.  Negative for hematuria.  Musculoskeletal: Negative.  Negative for back pain.  Skin: Negative.  Negative for rash.  Neurological: Negative.  Negative for dizziness, focal weakness, weakness and headaches.  Psychiatric/Behavioral: Negative.  The patient is not nervous/anxious.     As per HPI. Otherwise, a complete review of systems is negative.  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[place])    Morbid obesity ([place])    Sleep apnea     PAST SURGICAL HISTORY: Past Surgical History:  Procedure Laterality Date   COLONOSCOPY WITH PROPOFOL N/A 07/13/2020   Procedure: COLONOSCOPY WITH PROPOFOL;  Surgeon: Lesly Rubenstein, MD;  Location: [facility] ENDOSCOPY;  Service: Endoscopy;  Laterality: N/A;   COLONOSCOPY WITH PROPOFOL N/A 12/23/2020   Procedure: COLONOSCOPY WITH PROPOFOL;  Surgeon: Lucilla Lame, MD;  Location: [facility] ENDOSCOPY;  Service: Endoscopy;  Laterality: N/A;   ESOPHAGOGASTRODUODENOSCOPY (EGD) WITH PROPOFOL N/A 12/23/2020   Procedure: ESOPHAGOGASTRODUODENOSCOPY (EGD) WITH PROPOFOL;  Surgeon: Lucilla Lame, MD;  Location: [facility] ENDOSCOPY;  Service: Endoscopy;  Laterality: N/A;   GIVENS CAPSULE STUDY N/A 12/29/2020   Procedure: GIVENS CAPSULE STUDY;  Surgeon: Lin Landsman, MD;  Location: [facility] ENDOSCOPY;  Service: Gastroenterology;  Laterality: N/A;   TOOTH EXTRACTION      FAMILY HISTORY: Family History  Problem Relation Age of Onset   Benign prostatic hyperplasia Father    Psoriasis Father    Hypertension Mother    Hyperlipidemia Mother    Diabetes Maternal Grandmother    Diabetes Paternal Grandmother     ADVANCED DIRECTIVES (Y/N):  N  HEALTH MAINTENANCE: Social History   Tobacco Use   Smoking status: Never   Smokeless tobacco: Never  Vaping Use   Vaping Use: Never used  Substance Use Topics   Alcohol use: No   Drug use: No     Colonoscopy:  PAP:  Bone density:  Lipid panel:  Allergies  Allergen Reactions   Ampicillin Diarrhea   Bactrim [Sulfamethoxazole-Trimethoprim] Hives  Cephalexin    Claritin [Loratadine] Other (See Comments)    Irritates throat   Penicillins Rash    Current Outpatient Medications  Medication Sig Dispense Refill   acarbose (PRECOSE) 25 MG tablet Take 25 mg by mouth 3 (three) times daily with meals.     amLODipine (NORVASC) 10 MG tablet Take 10 mg by mouth daily.     fenofibrate 160 MG tablet  Take 160 mg by mouth daily.     fexofenadine (ALLEGRA) 180 MG tablet Take 180 mg by mouth daily.     fluticasone (FLONASE) 50 MCG/ACT nasal spray Place 2 sprays into both nostrils daily as needed.     glimepiride (AMARYL) 4 MG tablet Take 4 mg by mouth daily with breakfast.     HIBICLENS 4 % external liquid APPLY TOPICALLY DAILY AS NEEDED. TO WASHLEGS AND GROIN AREA 120 mL 3   latanoprost (XALATAN) 0.005 % ophthalmic solution Apply to eye.     lidocaine (LMX) 4 % cream Apply 1 application topically as needed.     lovastatin (MEVACOR) 10 MG tablet Take 10 mg by mouth daily.     metFORMIN (GLUCOPHAGE-XR) 500 MG 24 hr tablet Take 500-1,000 mg by mouth 2 (two) times daily. Takes 1 tablet with breakfast and 2 tablets with supper     metoprolol tartrate (LOPRESSOR) 50 MG tablet Take 50 mg by mouth 2 (two) times daily.     Multiple Vitamins-Minerals (MULTIVITAMIN WITH MINERALS) tablet Take 1 tablet by mouth daily.     mupirocin ointment (BACTROBAN) 2 % Apply to boils and ulcers QD PRN flares. 22 g 3   omeprazole (PRILOSEC) 20 MG capsule Take 20 mg by mouth daily.     pioglitazone (ACTOS) 30 MG tablet Take 30 mg by mouth daily.     ramipril (ALTACE) 10 MG capsule Take 10 mg by mouth 2 (two) times daily.     spironolactone (ALDACTONE) 50 MG tablet Take 50 mg by mouth daily.     terazosin (HYTRIN) 10 MG capsule Take 10 mg by mouth daily.      torsemide (DEMADEX) 20 MG tablet Take 20 mg by mouth daily.     traZODone (DESYREL) 150 MG tablet Take 150 mg by mouth at bedtime.     TRESIBA FLEXTOUCH 200 UNIT/ML FlexTouch Pen Inject 48 Units into the skin in the morning. Taking 100 units of insulin     vitamin B-12 (CYANOCOBALAMIN) 1000 MCG tablet Take 1,000 mcg by mouth daily.     Hydrocortisone-Aloe 1 % CREA Apply 1 application topically 2 (two) times daily as needed (itching). To itchy area on legs (Patient not taking: Reported on 11/16/2021)     ketoconazole (NIZORAL) 2 % cream Apply to rash in groin once a  day as needed for flares. (Patient not taking: Reported on 11/16/2021) 60 g 11   trolamine salicylate (ASPERCREME) 10 % cream Apply 1 application topically as needed for muscle pain. (Patient not taking: Reported on 11/16/2021)     No current facility-administered medications for this visit.    OBJECTIVE: Vitals:   02/28/22 0838  BP: (!) 154/56  Pulse: 77  Resp: 16  Temp: 97.9 F (36.6 C)  SpO2: 98%      Body mass index is 51.69 kg/m.    ECOG FS:0 - Asymptomatic  General: Well-developed, well-nourished, no acute distress. Eyes: Pink conjunctiva, anicteric sclera. HEENT: Normocephalic, moist mucous membranes. Lungs: No audible wheezing or coughing. Heart: Regular rate and rhythm. Abdomen: Soft, nontender, no obvious distention. Musculoskeletal:  No edema, cyanosis, or clubbing. Neuro: Alert, answering all questions appropriately. Cranial nerves grossly intact. Skin: No rashes or petechiae noted. Psych: Normal affect.  LAB RESULTS:  Lab Results  Component Value Date   NA 134 (L) 02/28/2022   K 4.2 02/28/2022   CL 104 02/28/2022   CO2 22 02/28/2022   GLUCOSE 221 (H) 02/28/2022   BUN 33 (H) 02/28/2022   CREATININE 0.88 02/28/2022   CALCIUM 8.8 (L) 02/28/2022   PROT 7.3 02/28/2022   ALBUMIN 3.9 02/28/2022   AST 29 02/28/2022   ALT 30 02/28/2022   ALKPHOS 37 (L) 02/28/2022   BILITOT 0.7 02/28/2022   GFRNONAA >60 02/28/2022   GFRAA >60 10/25/2016    Lab Results  Component Value Date   WBC 3.5 (L) 02/28/2022   NEUTROABS 2.9 02/28/2022   HGB 10.4 (L) 02/28/2022   HCT 30.2 (L) 02/28/2022   MCV 96.2 02/28/2022   PLT 256 02/28/2022     STUDIES: No results found.  ASSESSMENT:Idiopathic cytopenia of undetermined significance.  PLAN:    Idiopathic cytopenia of undetermined significance: Bone marrow biopsy on January 03, 2021 revealed a hypercellular marrow with erythroid hyperplasia and dyserythropoiesis.  Patient initially thought to have MDS, but second opinion  from [facility] did not feel Patrick Duncan bone marrow results supported this diagnosis.  No blasts, increased plasma cells, or clonality was noted.  Cytogenetics were reported as normal.  PET scan results from January 10, 2022 reviewed independently with no obvious evidence of underlying hypermetabolism suggestive of malignancy.  Patient has not required a blood transfusion since June 17, 2021. Recommendation from [facility] is to pursue Rituxan once weekly for 4 weeks to which patient has agreed.  Patient last received Aranesp on January 31, 2022.  Proceed with cycle 4 of 4 weekly Rituxan today.  Return to clinic in 2 weeks for laboratory work and consideration of Aranesp.  Patient will then return to clinic in 4 weeks for laboratory work, further evaluation, and continuation of treatment if needed.  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  Recent and repeat IgA levels were undetectable with a normal IgM and IgG.  Patient also has anti-IgA antibodies.  Patients with isolated IgA deficiency can have anaphylactoid reactions to blood transfusions, therefor Patrick Duncan needs matched and washed packed red blood cells.   Shortness of breath: Patient does not complain of this today.  Follow-up with pulmonary as indicated. Positive ANA: Patient noted to have a positive and ANA at Patrick Duncan work-up at [facility].  The clinical significance of this is unclear. Leukopenia: Chronic and unchanged. Splenomegaly: Mild.  PET scan results as above.  Possibly related to underlying NASH.   NASH: Patient reports Patrick Duncan is being evaluated at [facility]. Hyperglycemia: Chronic and unchanged.  Continue follow-up and treatment per primary care.  I spent a total of 30 minutes reviewing chart data, face-to-face evaluation with the patient, counseling and coordination of care as detailed above.   Patient expressed understanding and was in  agreement with this plan. Patrick Duncan also understands that Patrick Duncan can call clinic at any time with any questions, concerns, or complaints.    Lloyd Huger, MD   02/28/2022 9:06 AM

## 2022-02-28 NOTE — Patient Instructions (Signed)
[facility]  Discharge Instructions: Thank you for choosing [facility] to provide your oncology and hematology care.  If you have a lab appointment with the Cancer Center, please go directly to the Cancer Center and check in at the registration area.  Wear comfortable clothing and clothing appropriate for easy access to any Portacath or PICC line.   We strive to give you quality time with your provider. You may need to reschedule your appointment if you arrive late (15 or more minutes).  Arriving late affects you and other patients whose appointments are after yours.  Also, if you miss three or more appointments without notifying the office, you may be dismissed from the clinic at the provider's discretion.      For prescription refill requests, have your pharmacy contact our office and allow 72 hours for refills to be completed.     To help prevent nausea and vomiting after your treatment, we encourage you to take your nausea medication as directed.  BELOW ARE SYMPTOMS THAT SHOULD BE REPORTED IMMEDIATELY: *FEVER GREATER THAN 100.4 F (38 C) OR HIGHER *CHILLS OR SWEATING *NAUSEA AND VOMITING THAT IS NOT CONTROLLED WITH YOUR NAUSEA MEDICATION *UNUSUAL SHORTNESS OF BREATH *UNUSUAL BRUISING OR BLEEDING *URINARY PROBLEMS (pain or burning when urinating, or frequent urination) *BOWEL PROBLEMS (unusual diarrhea, constipation, pain near the anus) TENDERNESS IN MOUTH AND THROAT WITH OR WITHOUT PRESENCE OF ULCERS (sore throat, sores in mouth, or a toothache) UNUSUAL RASH, SWELLING OR PAIN  UNUSUAL VAGINAL DISCHARGE OR ITCHING   Items with * indicate a potential emergency and should be followed up as soon as possible or go to the Emergency Department if any problems should occur.  Please show the CHEMOTHERAPY ALERT CARD or IMMUNOTHERAPY ALERT CARD at check-in to the Emergency Department and triage nurse.  Should you have questions after your visit  or need to cancel or reschedule your appointment, please contact [facility]  [phone] and follow the prompts.  Office hours are 8:00 a.m. to 4:30 p.m. Monday - Friday. Please note that voicemails left after 4:00 p.m. may not be returned until the following business day.  We are closed weekends and major holidays. You have access to a nurse at all times for urgent questions. Please call the main number to the clinic [phone] and follow the prompts.  For any non-urgent questions, you may also contact your provider using MyChart. We now offer e-Visits for anyone 18 and older to request care online for non-urgent symptoms. For details visit [email or web address].   Also download the MyChart app! Go to the app store, search "MyChart", open the app, select [facility], and log in with your MyChart username and password.    

## 2022-02-28 NOTE — Addendum Note (Signed)
Addended by: Philipp Deputy on: 02/28/2022 09:10 AM   Modules accepted: Orders

## 2022-03-14 ENCOUNTER — Ambulatory Visit: Payer: Medicare HMO

## 2022-03-14 ENCOUNTER — Other Ambulatory Visit: Payer: Medicare HMO

## 2022-03-15 ENCOUNTER — Inpatient Hospital Stay: Payer: Medicare HMO

## 2022-03-15 DIAGNOSIS — D802 Selective deficiency of immunoglobulin A [IgA]: Secondary | ICD-10-CM | POA: Diagnosis not present

## 2022-03-15 DIAGNOSIS — Z5112 Encounter for antineoplastic immunotherapy: Secondary | ICD-10-CM | POA: Diagnosis not present

## 2022-03-15 DIAGNOSIS — D759 Disease of blood and blood-forming organs, unspecified: Secondary | ICD-10-CM

## 2022-03-15 LAB — CBC WITH DIFFERENTIAL/PLATELET
Abs Immature Granulocytes: 0.07 10*3/uL (ref 0.00–0.07)
Basophils Absolute: 0 10*3/uL (ref 0.0–0.1)
Basophils Relative: 1 %
Eosinophils Absolute: 0 10*3/uL (ref 0.0–0.5)
Eosinophils Relative: 0 %
HCT: 30.9 % — ABNORMAL LOW (ref 39.0–52.0)
Hemoglobin: 10.7 g/dL — ABNORMAL LOW (ref 13.0–17.0)
Immature Granulocytes: 2 %
Lymphocytes Relative: 5 %
Lymphs Abs: 0.2 10*3/uL — ABNORMAL LOW (ref 0.7–4.0)
MCH: 33.6 pg (ref 26.0–34.0)
MCHC: 34.6 g/dL (ref 30.0–36.0)
MCV: 97.2 fL (ref 80.0–100.0)
Monocytes Absolute: 0.4 10*3/uL (ref 0.1–1.0)
Monocytes Relative: 10 %
Neutro Abs: 3.2 10*3/uL (ref 1.7–7.7)
Neutrophils Relative %: 82 %
Platelets: 253 10*3/uL (ref 150–400)
RBC: 3.18 MIL/uL — ABNORMAL LOW (ref 4.22–5.81)
RDW: 13.3 % (ref 11.5–15.5)
WBC: 3.8 10*3/uL — ABNORMAL LOW (ref 4.0–10.5)
nRBC: 0 % (ref 0.0–0.2)

## 2022-03-15 LAB — SAMPLE TO BLOOD BANK

## 2022-03-15 LAB — COMPREHENSIVE METABOLIC PANEL
ALT: 36 U/L (ref 0–44)
AST: 37 U/L (ref 15–41)
Albumin: 4.1 g/dL (ref 3.5–5.0)
Alkaline Phosphatase: 38 U/L (ref 38–126)
Anion gap: 10 (ref 5–15)
BUN: 30 mg/dL — ABNORMAL HIGH (ref 6–20)
CO2: 23 mmol/L (ref 22–32)
Calcium: 8.9 mg/dL (ref 8.9–10.3)
Chloride: 100 mmol/L (ref 98–111)
Creatinine, Ser: 0.91 mg/dL (ref 0.61–1.24)
GFR, Estimated: >60 mL/min (ref >60)
Glucose, Bld: 231 mg/dL — ABNORMAL HIGH (ref 70–99)
Potassium: 3.8 mmol/L (ref 3.5–5.1)
Sodium: 133 mmol/L — ABNORMAL LOW (ref 135–145)
Total Bilirubin: 0.6 mg/dL (ref 0.3–1.2)
Total Protein: 7.6 g/dL (ref 6.5–8.1)

## 2022-03-15 NOTE — Progress Notes (Signed)
Patient hbg is 10.7, no aranesp today

## 2022-03-28 ENCOUNTER — Inpatient Hospital Stay: Payer: Medicare HMO

## 2022-03-28 ENCOUNTER — Inpatient Hospital Stay (HOSPITAL_BASED_OUTPATIENT_CLINIC_OR_DEPARTMENT_OTHER): Payer: Medicare HMO | Admitting: Oncology

## 2022-03-28 ENCOUNTER — Encounter: Payer: Self-pay | Admitting: Oncology

## 2022-03-28 ENCOUNTER — Inpatient Hospital Stay: Payer: Medicare HMO | Attending: Oncology

## 2022-03-28 VITALS — BP 122/78 | HR 75 | Temp 97.8°F | Ht 67.0 in | Wt 332.2 lb

## 2022-03-28 DIAGNOSIS — D759 Disease of blood and blood-forming organs, unspecified: Secondary | ICD-10-CM

## 2022-03-28 DIAGNOSIS — D802 Selective deficiency of immunoglobulin A [IgA]: Secondary | ICD-10-CM | POA: Diagnosis not present

## 2022-03-28 LAB — COMPREHENSIVE METABOLIC PANEL
ALT: 31 U/L (ref 0–44)
AST: 30 U/L (ref 15–41)
Albumin: 4.1 g/dL (ref 3.5–5.0)
Alkaline Phosphatase: 38 U/L (ref 38–126)
Anion gap: 9 (ref 5–15)
BUN: 36 mg/dL — ABNORMAL HIGH (ref 6–20)
CO2: 22 mmol/L (ref 22–32)
Calcium: 8.6 mg/dL — ABNORMAL LOW (ref 8.9–10.3)
Chloride: 101 mmol/L (ref 98–111)
Creatinine, Ser: 1.1 mg/dL (ref 0.61–1.24)
GFR, Estimated: >60 mL/min (ref >60)
Glucose, Bld: 280 mg/dL — ABNORMAL HIGH (ref 70–99)
Potassium: 4 mmol/L (ref 3.5–5.1)
Sodium: 132 mmol/L — ABNORMAL LOW (ref 135–145)
Total Bilirubin: 0.7 mg/dL (ref 0.3–1.2)
Total Protein: 7.3 g/dL (ref 6.5–8.1)

## 2022-03-28 LAB — CBC WITH DIFFERENTIAL/PLATELET
Abs Immature Granulocytes: 0.07 10*3/uL (ref 0.00–0.07)
Basophils Absolute: 0 10*3/uL (ref 0.0–0.1)
Basophils Relative: 0 %
Eosinophils Absolute: 0 10*3/uL (ref 0.0–0.5)
Eosinophils Relative: 0 %
HCT: 30.4 % — ABNORMAL LOW (ref 39.0–52.0)
Hemoglobin: 10.3 g/dL — ABNORMAL LOW (ref 13.0–17.0)
Immature Granulocytes: 2 %
Lymphocytes Relative: 4 %
Lymphs Abs: 0.2 10*3/uL — ABNORMAL LOW (ref 0.7–4.0)
MCH: 33.7 pg (ref 26.0–34.0)
MCHC: 33.9 g/dL (ref 30.0–36.0)
MCV: 99.3 fL (ref 80.0–100.0)
Monocytes Absolute: 0.4 10*3/uL (ref 0.1–1.0)
Monocytes Relative: 10 %
Neutro Abs: 3.6 10*3/uL (ref 1.7–7.7)
Neutrophils Relative %: 84 %
Platelets: 256 10*3/uL (ref 150–400)
RBC: 3.06 MIL/uL — ABNORMAL LOW (ref 4.22–5.81)
RDW: 13.6 % (ref 11.5–15.5)
WBC: 4.3 10*3/uL (ref 4.0–10.5)
nRBC: 0 % (ref 0.0–0.2)

## 2022-03-28 LAB — SAMPLE TO BLOOD BANK

## 2022-03-28 MED ORDER — DARBEPOETIN ALFA 500 MCG/ML IJ SOSY: 500.0000 ug | PREFILLED_SYRINGE | Freq: Once | INTRAMUSCULAR | Status: DC

## 2022-03-28 NOTE — Progress Notes (Signed)
[place]  Telephone:(336) [phone] Fax:(336) [phone]  ID: Dorathy Kinsman OB: Jul 09, 1965  MR#: [id number]  [id number]  Patient Care Team: Sofie Hartigan, MD as PCP - General (Family Medicine) Lloyd Huger, MD as Consulting Physician (Oncology)  CHIEF COMPLAINT: Idiopathic cytopenia of undetermined significance.    INTERVAL HISTORY: Patient returns to clinic today for further evaluation and consideration of Aranesp.  He continues to feel well and remains asymptomatic.  He does not complain of any weakness or fatigue today. He has no neurologic complaints.  He denies any recent fevers.  He has a fair appetite, but denies weight loss.  He denies any chest pain, shortness of breath, cough, or hemoptysis.  He denies any nausea, vomiting, constipation, or diarrhea.  He has no melena or hematochezia.  He has no urinary complaints.  [name]  REVIEW OF SYSTEMS:   Review of Systems  Constitutional: Negative.  Negative for fever, malaise/fatigue and weight loss.  Respiratory: Negative.  Negative for cough, hemoptysis and shortness of breath.   Cardiovascular: Negative.  Negative for chest pain and leg swelling.  Gastrointestinal: Negative.  Negative for abdominal pain, blood in stool and melena.  Genitourinary: Negative.  Negative for hematuria.  Musculoskeletal: Negative.  Negative for back pain.  Skin: Negative.  Negative for rash.  Neurological: Negative.  Negative for dizziness, focal weakness, weakness and headaches.  Psychiatric/Behavioral: Negative.  The patient is not nervous/anxious.     As per HPI. Otherwise, a complete review of systems is negative.  PAST MEDICAL HISTORY: Past Medical History:  Diagnosis Date   Arthritis    Cellulitis and abscess of left leg    Depression    Diabetes mellitus without complication (HCC)    Hearing loss    History of IBS    HLD (hyperlipidemia)    Hypertension    IgA  deficiency ([place])    Morbid obesity ([place])    Sleep apnea     PAST SURGICAL HISTORY: Past Surgical History:  Procedure Laterality Date   COLONOSCOPY WITH PROPOFOL N/A 07/13/2020   Procedure: COLONOSCOPY WITH PROPOFOL;  Surgeon: Lesly Rubenstein, MD;  Location: [facility] ENDOSCOPY;  Service: Endoscopy;  Laterality: N/A;   COLONOSCOPY WITH PROPOFOL N/A 12/23/2020   Procedure: COLONOSCOPY WITH PROPOFOL;  Surgeon: Lucilla Lame, MD;  Location: [facility] ENDOSCOPY;  Service: Endoscopy;  Laterality: N/A;   ESOPHAGOGASTRODUODENOSCOPY (EGD) WITH PROPOFOL N/A 12/23/2020   Procedure: ESOPHAGOGASTRODUODENOSCOPY (EGD) WITH PROPOFOL;  Surgeon: Lucilla Lame, MD;  Location[facility]COPY;  Service: Endoscopy;  Laterality: N/A;   GIVENS CAPSULE STUDY N/A 12/29/2020   Procedure: GIVENS CAPSULE STUDY;  Surgeon: Lin Landsman, MD;  Location: [facility] ENDOSCOPY;  Service: Gastroenterology;  Laterality: N/A;   TOOTH EXTRACTION      FAMILY HISTORY: Family History  Problem Relation Age of Onset   Benign prostatic hyperplasia Father    Psoriasis Father    Hypertension Mother    Hyperlipidemia Mother    Diabetes Maternal Grandmother    Diabetes Paternal Grandmother     ADVANCED DIRECTIVES (Y/N):  N  HEALTH MAINTENANCE: Social History   Tobacco Use   Smoking status: Never   Smokeless tobacco: Never  Vaping Use   Vaping Use: Never used  Substance Use Topics   Alcohol use: No   Drug use: No     Colonoscopy:  PAP:  Bone density:  Lipid panel:  Allergies  Allergen Reactions   Ampicillin Diarrhea   Bactrim [Sulfamethoxazole-Trimethoprim] Hives   Cephalexin  Claritin [Loratadine] Other (See Comments)    Irritates throat   Penicillins Rash    Current Outpatient Medications  Medication Sig Dispense Refill   acarbose (PRECOSE) 25 MG tablet Take 25 mg by mouth 3 (three) times daily with meals.     amLODipine (NORVASC) 10 MG tablet Take 10 mg by mouth daily.     fenofibrate 160 MG tablet Take 160  mg by mouth daily.     fexofenadine (ALLEGRA) 180 MG tablet Take 180 mg by mouth daily.     fluticasone (FLONASE) 50 MCG/ACT nasal spray Place 2 sprays into both nostrils daily as needed.     glimepiride (AMARYL) 4 MG tablet Take 4 mg by mouth daily with breakfast.     HIBICLENS 4 % external liquid APPLY TOPICALLY DAILY AS NEEDED. TO WASHLEGS AND GROIN AREA 120 mL 3   latanoprost (XALATAN) 0.005 % ophthalmic solution Apply to eye.     lidocaine (LMX) 4 % cream Apply 1 application topically as needed.     lovastatin (MEVACOR) 10 MG tablet Take 10 mg by mouth daily.     metFORMIN (GLUCOPHAGE-XR) 500 MG 24 hr tablet Take 500-1,000 mg by mouth 2 (two) times daily. Takes 1 tablet with breakfast and 2 tablets with supper     metoprolol tartrate (LOPRESSOR) 50 MG tablet Take 50 mg by mouth 2 (two) times daily.     Multiple Vitamins-Minerals (MULTIVITAMIN WITH MINERALS) tablet Take 1 tablet by mouth daily.     mupirocin ointment (BACTROBAN) 2 % Apply to boils and ulcers QD PRN flares. 22 g 3   Hydrocortisone-Aloe 1 % CREA Apply 1 application topically 2 (two) times daily as needed (itching). To itchy area on legs (Patient not taking: Reported on 11/16/2021)     ketoconazole (NIZORAL) 2 % cream Apply to rash in groin once a day as needed for flares. (Patient not taking: Reported on 11/16/2021) 60 g 11   omeprazole (PRILOSEC) 20 MG capsule Take 20 mg by mouth daily.     pioglitazone (ACTOS) 30 MG tablet Take 30 mg by mouth daily.     ramipril (ALTACE) 10 MG capsule Take 10 mg by mouth 2 (two) times daily.     spironolactone (ALDACTONE) 50 MG tablet Take 50 mg by mouth daily.     terazosin (HYTRIN) 10 MG capsule Take 10 mg by mouth daily.      torsemide (DEMADEX) 20 MG tablet Take 20 mg by mouth daily.     traZODone (DESYREL) 150 MG tablet Take 150 mg by mouth at bedtime.     TRESIBA FLEXTOUCH 200 UNIT/ML FlexTouch Pen Inject 48 Units into the skin in the morning. Taking 100 units of insulin     trolamine  salicylate (ASPERCREME) 10 % cream Apply 1 application topically as needed for muscle pain. (Patient not taking: Reported on 11/16/2021)     vitamin B-12 (CYANOCOBALAMIN) 1000 MCG tablet Take 1,000 mcg by mouth daily.     No current facility-administered medications for this visit.    OBJECTIVE: Vitals:   03/28/22 0830  BP: 122/78  Pulse: 75  Temp: 97.8 F (36.6 C)      Body mass index is 52.03 kg/m.    ECOG FS:0 - Asymptomatic  General: Well-developed, well-nourished, no acute distress. Eyes: Pink conjunctiva, anicteric sclera. HEENT: Normocephalic, moist mucous membranes. Lungs: No audible wheezing or coughing. Heart: Regular rate and rhythm. Abdomen: Soft, nontender, no obvious distention. Musculoskeletal: No edema, cyanosis, or clubbing. Neuro: Alert, answering all questions appropriately.  Cranial nerves grossly intact. Skin: No rashes or petechiae noted. Psych: Normal affect.  LAB RESULTS:  Lab Results  Component Value Date   NA 132 (L) 03/28/2022   K 4.0 03/28/2022   CL 101 03/28/2022   CO2 22 03/28/2022   GLUCOSE 280 (H) 03/28/2022   BUN 36 (H) 03/28/2022   CREATININE 1.10 03/28/2022   CALCIUM 8.6 (L) 03/28/2022   PROT 7.3 03/28/2022   ALBUMIN 4.1 03/28/2022   AST 30 03/28/2022   ALT 31 03/28/2022   ALKPHOS 38 03/28/2022   BILITOT 0.7 03/28/2022   GFRNONAA >60 03/28/2022   GFRAA >60 10/25/2016    Lab Results  Component Value Date   WBC 4.3 03/28/2022   NEUTROABS 3.6 03/28/2022   HGB 10.3 (L) 03/28/2022   HCT 30.4 (L) 03/28/2022   MCV 99.3 03/28/2022   PLT 256 03/28/2022     STUDIES: No results found.  ASSESSMENT:Idiopathic cytopenia of undetermined significance.  PLAN:    Idiopathic cytopenia of undetermined significance: Bone marrow biopsy on January 03, 2021 revealed a hypercellular marrow with erythroid hyperplasia and dyserythropoiesis.  Patient initially thought to have MDS, but second opinion from [facility] did not feel his bone marrow  results supported this diagnosis.  No blasts, increased plasma cells, or clonality was noted.  Cytogenetics were reported as normal.  PET scan results from January 10, 2022 reviewed independently with no obvious evidence of underlying hypermetabolism suggestive of malignancy.  Patient has not required a blood transfusion since June 17, 2021.  His last Aranesp was given on January 31, 2022.  His fourth infusion of weekly Rituxan was given on February 28, 2022.  Patient's hemoglobin is greater than 10.0, therefore he does not require Aranesp today.  Return to clinic in 2, 4, and 6 weeks for lab and Aranesp only.  Patient will then return to clinic in 8 weeks for laboratory work, further evaluation, and continuation of treatment if needed.  Goal will be to maintain hemoglobin greater than 10.0 IgA deficiency: Patient reports he was diagnosed as a child, but has not had repeated infections throughout adulthood.  Recent and repeat IgA levels were undetectable with a normal IgM and IgG.  Patient also has anti-IgA antibodies.  Patients with isolated IgA deficiency can have anaphylactoid reactions to blood transfusions, therefor he needs matched and washed packed red blood cells.   Shortness of breath: Resolved.  Follow-up with pulmonary as indicated. Positive ANA: Likely clinically insignificant.   Leukopenia: Resolved.   Splenomegaly: Mild.  PET scan results as above.  Possibly related to underlying NASH.   NASH: Continue follow-up at [place] as scheduled. Hyperglycemia: Patient continues to have poor blood glucose control.  Continue follow-up and treatment per primary care.   Patient expressed understanding and was in agreement with this plan. He also understands that He can call clinic at any time with any questions, concerns, or complaints.    Lloyd Huger, MD   03/28/2022 9:17 AM

## 2022-04-11 ENCOUNTER — Inpatient Hospital Stay: Payer: Medicare HMO

## 2022-04-11 DIAGNOSIS — D802 Selective deficiency of immunoglobulin A [IgA]: Secondary | ICD-10-CM | POA: Diagnosis not present

## 2022-04-11 DIAGNOSIS — D759 Disease of blood and blood-forming organs, unspecified: Secondary | ICD-10-CM

## 2022-04-11 LAB — COMPREHENSIVE METABOLIC PANEL
ALT: 33 U/L (ref 0–44)
AST: 32 U/L (ref 15–41)
Albumin: 4 g/dL (ref 3.5–5.0)
Alkaline Phosphatase: 37 U/L — ABNORMAL LOW (ref 38–126)
Anion gap: 7 (ref 5–15)
BUN: 40 mg/dL — ABNORMAL HIGH (ref 6–20)
CO2: 22 mmol/L (ref 22–32)
Calcium: 8.9 mg/dL (ref 8.9–10.3)
Chloride: 103 mmol/L (ref 98–111)
Creatinine, Ser: 1.13 mg/dL (ref 0.61–1.24)
GFR, Estimated: >60 mL/min (ref >60)
Glucose, Bld: 270 mg/dL — ABNORMAL HIGH (ref 70–99)
Potassium: 4.2 mmol/L (ref 3.5–5.1)
Sodium: 132 mmol/L — ABNORMAL LOW (ref 135–145)
Total Bilirubin: 0.7 mg/dL (ref 0.3–1.2)
Total Protein: 7.2 g/dL (ref 6.5–8.1)

## 2022-04-11 LAB — CBC WITH DIFFERENTIAL/PLATELET
Abs Immature Granulocytes: 0.23 10*3/uL — ABNORMAL HIGH (ref 0.00–0.07)
Basophils Absolute: 0 10*3/uL (ref 0.0–0.1)
Basophils Relative: 0 %
Eosinophils Absolute: 0 10*3/uL (ref 0.0–0.5)
Eosinophils Relative: 0 %
HCT: 29.8 % — ABNORMAL LOW (ref 39.0–52.0)
Hemoglobin: 10.3 g/dL — ABNORMAL LOW (ref 13.0–17.0)
Immature Granulocytes: 8 %
Lymphocytes Relative: 4 %
Lymphs Abs: 0.1 10*3/uL — ABNORMAL LOW (ref 0.7–4.0)
MCH: 33.8 pg (ref 26.0–34.0)
MCHC: 34.6 g/dL (ref 30.0–36.0)
MCV: 97.7 fL (ref 80.0–100.0)
Monocytes Absolute: 0.3 10*3/uL (ref 0.1–1.0)
Monocytes Relative: 11 %
Neutro Abs: 2.3 10*3/uL (ref 1.7–7.7)
Neutrophils Relative %: 77 %
Platelets: 311 10*3/uL (ref 150–400)
RBC: 3.05 MIL/uL — ABNORMAL LOW (ref 4.22–5.81)
RDW: 13.3 % (ref 11.5–15.5)
Smear Review: NORMAL
WBC: 3 10*3/uL — ABNORMAL LOW (ref 4.0–10.5)
nRBC: 0 % (ref 0.0–0.2)

## 2022-04-11 LAB — SAMPLE TO BLOOD BANK

## 2022-04-11 NOTE — Progress Notes (Signed)
No injection needed today. Hgb 10.3

## 2022-04-24 ENCOUNTER — Other Ambulatory Visit: Payer: Self-pay

## 2022-04-24 DIAGNOSIS — D649 Anemia, unspecified: Secondary | ICD-10-CM

## 2022-04-24 DIAGNOSIS — D759 Disease of blood and blood-forming organs, unspecified: Secondary | ICD-10-CM

## 2022-04-25 ENCOUNTER — Inpatient Hospital Stay: Payer: Medicare HMO | Attending: Oncology

## 2022-04-25 ENCOUNTER — Inpatient Hospital Stay: Payer: Medicare HMO

## 2022-04-25 DIAGNOSIS — D802 Selective deficiency of immunoglobulin A [IgA]: Secondary | ICD-10-CM | POA: Insufficient documentation

## 2022-04-25 DIAGNOSIS — D649 Anemia, unspecified: Secondary | ICD-10-CM

## 2022-04-25 DIAGNOSIS — D759 Disease of blood and blood-forming organs, unspecified: Secondary | ICD-10-CM | POA: Diagnosis present

## 2022-04-25 LAB — CBC WITH DIFFERENTIAL (CANCER CENTER ONLY)
Abs Immature Granulocytes: 0.15 10*3/uL — ABNORMAL HIGH (ref 0.00–0.07)
Basophils Absolute: 0 10*3/uL (ref 0.0–0.1)
Basophils Relative: 1 %
Eosinophils Absolute: 0 10*3/uL (ref 0.0–0.5)
Eosinophils Relative: 0 %
HCT: 30.4 % — ABNORMAL LOW (ref 39.0–52.0)
Hemoglobin: 10.4 g/dL — ABNORMAL LOW (ref 13.0–17.0)
Immature Granulocytes: 10 %
Lymphocytes Relative: 7 %
Lymphs Abs: 0.1 10*3/uL — ABNORMAL LOW (ref 0.7–4.0)
MCH: 33.8 pg (ref 26.0–34.0)
MCHC: 34.2 g/dL (ref 30.0–36.0)
MCV: 98.7 fL (ref 80.0–100.0)
Monocytes Absolute: 0.3 10*3/uL (ref 0.1–1.0)
Monocytes Relative: 21 %
Neutro Abs: 0.9 10*3/uL — ABNORMAL LOW (ref 1.7–7.7)
Neutrophils Relative %: 61 %
Platelet Count: 245 10*3/uL (ref 150–400)
RBC: 3.08 MIL/uL — ABNORMAL LOW (ref 4.22–5.81)
RDW: 13.2 % (ref 11.5–15.5)
Smear Review: NORMAL
WBC Count: 1.4 10*3/uL — ABNORMAL LOW (ref 4.0–10.5)
nRBC: 0 % (ref 0.0–0.2)

## 2022-04-25 LAB — SAMPLE TO BLOOD BANK

## 2022-04-25 LAB — CMP (CANCER CENTER ONLY)
ALT: 30 U/L (ref 0–44)
AST: 31 U/L (ref 15–41)
Albumin: 4.1 g/dL (ref 3.5–5.0)
Alkaline Phosphatase: 44 U/L (ref 38–126)
Anion gap: 7 (ref 5–15)
BUN: 34 mg/dL — ABNORMAL HIGH (ref 6–20)
CO2: 21 mmol/L — ABNORMAL LOW (ref 22–32)
Calcium: 9 mg/dL (ref 8.9–10.3)
Chloride: 105 mmol/L (ref 98–111)
Creatinine: 0.95 mg/dL (ref 0.61–1.24)
GFR, Estimated: >60 mL/min (ref >60)
Glucose, Bld: 287 mg/dL — ABNORMAL HIGH (ref 70–99)
Potassium: 4.2 mmol/L (ref 3.5–5.1)
Sodium: 133 mmol/L — ABNORMAL LOW (ref 135–145)
Total Bilirubin: 0.6 mg/dL (ref 0.3–1.2)
Total Protein: 7.3 g/dL (ref 6.5–8.1)

## 2022-04-25 NOTE — Progress Notes (Signed)
Aranesp injection held. Hgb 10.4

## 2022-05-08 ENCOUNTER — Other Ambulatory Visit: Payer: Self-pay

## 2022-05-08 DIAGNOSIS — D759 Disease of blood and blood-forming organs, unspecified: Secondary | ICD-10-CM

## 2022-05-09 ENCOUNTER — Inpatient Hospital Stay: Payer: Medicare HMO

## 2022-05-09 DIAGNOSIS — D759 Disease of blood and blood-forming organs, unspecified: Secondary | ICD-10-CM

## 2022-05-09 DIAGNOSIS — D802 Selective deficiency of immunoglobulin A [IgA]: Secondary | ICD-10-CM | POA: Diagnosis not present

## 2022-05-09 LAB — CBC WITH DIFFERENTIAL (CANCER CENTER ONLY)
Abs Immature Granulocytes: 0.18 10*3/uL — ABNORMAL HIGH (ref 0.00–0.07)
Basophils Absolute: 0 10*3/uL (ref 0.0–0.1)
Basophils Relative: 1 %
Eosinophils Absolute: 0 10*3/uL (ref 0.0–0.5)
Eosinophils Relative: 0 %
HCT: 30.1 % — ABNORMAL LOW (ref 39.0–52.0)
Hemoglobin: 10.2 g/dL — ABNORMAL LOW (ref 13.0–17.0)
Immature Granulocytes: 5 %
Lymphocytes Relative: 5 %
Lymphs Abs: 0.2 10*3/uL — ABNORMAL LOW (ref 0.7–4.0)
MCH: 32.7 pg (ref 26.0–34.0)
MCHC: 33.9 g/dL (ref 30.0–36.0)
MCV: 96.5 fL (ref 80.0–100.0)
Monocytes Absolute: 0.4 10*3/uL (ref 0.1–1.0)
Monocytes Relative: 12 %
Neutro Abs: 2.8 10*3/uL (ref 1.7–7.7)
Neutrophils Relative %: 77 %
Platelet Count: 289 10*3/uL (ref 150–400)
RBC: 3.12 MIL/uL — ABNORMAL LOW (ref 4.22–5.81)
RDW: 13 % (ref 11.5–15.5)
WBC Count: 3.6 10*3/uL — ABNORMAL LOW (ref 4.0–10.5)
nRBC: 0 % (ref 0.0–0.2)

## 2022-05-09 LAB — CMP (CANCER CENTER ONLY)
ALT: 26 U/L (ref 0–44)
AST: 28 U/L (ref 15–41)
Albumin: 3.9 g/dL (ref 3.5–5.0)
Alkaline Phosphatase: 36 U/L — ABNORMAL LOW (ref 38–126)
Anion gap: 9 (ref 5–15)
BUN: 28 mg/dL — ABNORMAL HIGH (ref 6–20)
CO2: 21 mmol/L — ABNORMAL LOW (ref 22–32)
Calcium: 8.9 mg/dL (ref 8.9–10.3)
Chloride: 104 mmol/L (ref 98–111)
Creatinine: 0.92 mg/dL (ref 0.61–1.24)
GFR, Estimated: >60 mL/min (ref >60)
Glucose, Bld: 267 mg/dL — ABNORMAL HIGH (ref 70–99)
Potassium: 3.9 mmol/L (ref 3.5–5.1)
Sodium: 134 mmol/L — ABNORMAL LOW (ref 135–145)
Total Bilirubin: 0.7 mg/dL (ref 0.3–1.2)
Total Protein: 7.4 g/dL (ref 6.5–8.1)

## 2022-05-09 NOTE — Progress Notes (Signed)
Patient hgb 10.2 no aranesp today

## 2022-05-10 LAB — SAMPLE TO BLOOD BANK

## 2022-05-22 ENCOUNTER — Other Ambulatory Visit: Payer: Self-pay

## 2022-05-22 DIAGNOSIS — D759 Disease of blood and blood-forming organs, unspecified: Secondary | ICD-10-CM

## 2022-05-23 ENCOUNTER — Inpatient Hospital Stay (HOSPITAL_BASED_OUTPATIENT_CLINIC_OR_DEPARTMENT_OTHER): Payer: Medicare HMO | Admitting: Oncology

## 2022-05-23 ENCOUNTER — Encounter: Payer: Self-pay | Admitting: Oncology

## 2022-05-23 ENCOUNTER — Inpatient Hospital Stay: Payer: Medicare HMO | Attending: Oncology

## 2022-05-23 ENCOUNTER — Inpatient Hospital Stay: Payer: Medicare HMO

## 2022-05-23 VITALS — BP 136/54 | HR 81 | Temp 96.6°F | Wt 332.2 lb

## 2022-05-23 DIAGNOSIS — Z794 Long term (current) use of insulin: Secondary | ICD-10-CM | POA: Diagnosis not present

## 2022-05-23 DIAGNOSIS — E1165 Type 2 diabetes mellitus with hyperglycemia: Secondary | ICD-10-CM | POA: Insufficient documentation

## 2022-05-23 DIAGNOSIS — K7581 Nonalcoholic steatohepatitis (NASH): Secondary | ICD-10-CM | POA: Diagnosis not present

## 2022-05-23 DIAGNOSIS — D72819 Decreased white blood cell count, unspecified: Secondary | ICD-10-CM | POA: Diagnosis present

## 2022-05-23 DIAGNOSIS — D759 Disease of blood and blood-forming organs, unspecified: Secondary | ICD-10-CM

## 2022-05-23 DIAGNOSIS — R161 Splenomegaly, not elsewhere classified: Secondary | ICD-10-CM | POA: Insufficient documentation

## 2022-05-23 LAB — CBC WITH DIFFERENTIAL (CANCER CENTER ONLY)
Abs Immature Granulocytes: 0.07 10*3/uL (ref 0.00–0.07)
Basophils Absolute: 0 10*3/uL (ref 0.0–0.1)
Basophils Relative: 0 %
Eosinophils Absolute: 0 10*3/uL (ref 0.0–0.5)
Eosinophils Relative: 0 %
HCT: 31.5 % — ABNORMAL LOW (ref 39.0–52.0)
Hemoglobin: 10.7 g/dL — ABNORMAL LOW (ref 13.0–17.0)
Immature Granulocytes: 2 %
Lymphocytes Relative: 6 %
Lymphs Abs: 0.2 10*3/uL — ABNORMAL LOW (ref 0.7–4.0)
MCH: 32.5 pg (ref 26.0–34.0)
MCHC: 34 g/dL (ref 30.0–36.0)
MCV: 95.7 fL (ref 80.0–100.0)
Monocytes Absolute: 0.4 10*3/uL (ref 0.1–1.0)
Monocytes Relative: 11 %
Neutro Abs: 3.2 10*3/uL (ref 1.7–7.7)
Neutrophils Relative %: 81 %
Platelet Count: 251 10*3/uL (ref 150–400)
RBC: 3.29 MIL/uL — ABNORMAL LOW (ref 4.22–5.81)
RDW: 13.9 % (ref 11.5–15.5)
WBC Count: 3.9 10*3/uL — ABNORMAL LOW (ref 4.0–10.5)
nRBC: 0 % (ref 0.0–0.2)

## 2022-05-23 LAB — CMP (CANCER CENTER ONLY)
ALT: 28 U/L (ref 0–44)
AST: 33 U/L (ref 15–41)
Albumin: 4.1 g/dL (ref 3.5–5.0)
Alkaline Phosphatase: 44 U/L (ref 38–126)
Anion gap: 10 (ref 5–15)
BUN: 36 mg/dL — ABNORMAL HIGH (ref 6–20)
CO2: 22 mmol/L (ref 22–32)
Calcium: 9 mg/dL (ref 8.9–10.3)
Chloride: 101 mmol/L (ref 98–111)
Creatinine: 0.95 mg/dL (ref 0.61–1.24)
GFR, Estimated: >60 mL/min (ref >60)
Glucose, Bld: 303 mg/dL — ABNORMAL HIGH (ref 70–99)
Potassium: 4.3 mmol/L (ref 3.5–5.1)
Sodium: 133 mmol/L — ABNORMAL LOW (ref 135–145)
Total Bilirubin: 0.6 mg/dL (ref 0.3–1.2)
Total Protein: 7.4 g/dL (ref 6.5–8.1)

## 2022-05-23 NOTE — Progress Notes (Signed)
[facility]  [phone] [phone]) [phone]  ID: Patrick Duncan OB: 15-Apr-1965  MR#: [id number]  AOZ#:[id number]  Patient Care Team: Marina Goodell, MD as PCP - General (Family Medicine) Jeralyn Ruths, MD as Consulting Physician (Oncology)  CHIEF COMPLAINT: Idiopathic cytopenia of undetermined significance.    INTERVAL HISTORY: Patient returns to clinic today for repeat laboratory work and further evaluation.  He reports significant issues with hyperglycemia, but otherwise feels well. He does not complain of any weakness or fatigue today. He has no neurologic complaints.  He denies any recent fevers.  He has a fair appetite, but denies weight loss.  He denies any chest pain, shortness of breath, cough, or hemoptysis.  He denies any nausea, vomiting, constipation, or diarrhea.  He has no melena or hematochezia.  He has no urinary complaints.  Patient offers no further specific complaints today.  REVIEW OF SYSTEMS:   Review of Systems  Constitutional: Negative.  Negative for fever, malaise/fatigue and weight loss.  Respiratory: Negative.  Negative for cough, hemoptysis and shortness of breath.   Cardiovascular: Negative.  Negative for chest pain and leg swelling.  Gastrointestinal: Negative.  Negative for abdominal pain, blood in stool and melena.  Genitourinary: Negative.  Negative for hematuria.  Musculoskeletal: Negative.  Negative for back pain.  Skin: Negative.  Negative for rash.  Neurological: Negative.  Negative for dizziness, focal weakness, weakness and headaches.  Psychiatric/Behavioral: Negative.  The patient is not nervous/anxious.     As per HPI. Otherwise, a complete review of systems is negative.  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HCC)    Morbid obesity (HCC)    Sleep apnea     PAST SURGICAL HISTORY: Past Surgical History:  Procedure Laterality Date   COLONOSCOPY WITH PROPOFOL N/A 07/13/2020   Procedure: COLONOSCOPY WITH PROPOFOL;  Surgeon: Regis Bill, MD;  Location: [facility] ENDOSCOPY;  Service: Endoscopy;  Laterality: N/A;   COLONOSCOPY WITH PROPOFOL N/A 12/23/2020   Procedure: COLONOSCOPY WITH PROPOFOL;  Surgeon: Midge Minium, MD;  Location: [facility] ENDOSCOPY;  Service: Endoscopy;  Laterality: N/A;   ESOPHAGOGASTRODUODENOSCOPY (EGD) WITH PROPOFOL N/A 12/23/2020   Procedure: ESOPHAGOGASTRODUODENOSCOPY (EGD) WITH PROPOFOL;  Surgeon: Midge Minium, MD;  Location: [facility] ENDOSCOPY;  Service: Endoscopy;  Laterality: N/A;   GIVENS CAPSULE STUDY N/A 12/29/2020   Procedure: GIVENS CAPSULE STUDY;  Surgeon: Toney Reil, MD;  Location: [facility] ENDOSCOPY;  Service: Gastroenterology;  Laterality: N/A;   TOOTH EXTRACTION      FAMILY HISTORY: Family History  Problem Relation Age of Onset   Benign prostatic hyperplasia Father    Psoriasis Father    Hypertension Mother    Hyperlipidemia Mother    Diabetes Maternal Grandmother    Diabetes Paternal Grandmother     ADVANCED DIRECTIVES (Y/N):  N  HEALTH MAINTENANCE: Social History   Tobacco Use   Smoking status: Never   Smokeless tobacco: Never  Vaping Use   Vaping Use: Never used  Substance Use Topics   Alcohol use: No   Drug use: No     Colonoscopy:  PAP:  Bone density:  Lipid panel:  Allergies  Allergen Reactions   Ampicillin Diarrhea   Bactrim [Sulfamethoxazole-Trimethoprim] Hives  Cephalexin    Claritin [Loratadine] Other (See Comments)    Irritates throat   Penicillins Rash    Current Outpatient Medications  Medication Sig Dispense Refill   acarbose (PRECOSE) 25 MG tablet Take 25 mg by mouth 3 (three) times daily with meals.     acetaminophen (TYLENOL) 500 MG tablet Take 500 mg by mouth  every 6 (six) hours as needed.     amLODipine (NORVASC) 10 MG tablet Take 10 mg by mouth daily.     Blood Pressure Monitor MISC Use 1 each once daily As directed     fenofibrate 160 MG tablet Take 160 mg by mouth daily.     fexofenadine (ALLEGRA) 180 MG tablet Take 180 mg by mouth daily.     fluticasone (FLONASE) 50 MCG/ACT nasal spray Place 2 sprays into both nostrils daily as needed.     glimepiride (AMARYL) 4 MG tablet Take 4 mg by mouth daily with breakfast.     HIBICLENS 4 % external liquid APPLY TOPICALLY DAILY AS NEEDED. TO WASHLEGS AND GROIN AREA 120 mL 3   Hydrocortisone-Aloe 1 % CREA Apply 1 application  topically 2 (two) times daily as needed (itching). To itchy area on legs     ketoconazole (NIZORAL) 2 % cream Apply to rash in groin once a day as needed for flares. 60 g 11   lidocaine (LMX) 4 % cream Apply 1 application topically as needed.     lovastatin (MEVACOR) 10 MG tablet Take 10 mg by mouth daily.     metFORMIN (GLUCOPHAGE-XR) 500 MG 24 hr tablet Take 500-1,000 mg by mouth 2 (two) times daily. Takes 1 tablet with breakfast and 2 tablets with supper     metoprolol tartrate (LOPRESSOR) 50 MG tablet Take 50 mg by mouth 2 (two) times daily.     Multiple Vitamins-Minerals (MULTIVITAMIN WITH MINERALS) tablet Take 1 tablet by mouth daily.     mupirocin ointment (BACTROBAN) 2 % Apply to boils and ulcers QD PRN flares. 22 g 3   omeprazole (PRILOSEC) 20 MG capsule Take 20 mg by mouth daily.     pioglitazone (ACTOS) 30 MG tablet Take 30 mg by mouth daily.     ramipril (ALTACE) 10 MG capsule Take 10 mg by mouth 2 (two) times daily.     spironolactone (ALDACTONE) 50 MG tablet Take 50 mg by mouth daily.     terazosin (HYTRIN) 10 MG capsule Take 10 mg by mouth daily.      torsemide (DEMADEX) 20 MG tablet Take 20 mg by mouth daily.     traZODone (DESYREL) 150 MG tablet Take 150 mg by mouth at bedtime.     TRESIBA FLEXTOUCH 200 UNIT/ML FlexTouch Pen Inject 48 Units into the skin in the  morning. Taking 100 units of insulin     trolamine salicylate (ASPERCREME) 10 % cream Apply 1 application  topically as needed for muscle pain.     vitamin B-12 (CYANOCOBALAMIN) 1000 MCG tablet Take 1,000 mcg by mouth daily.     No current facility-administered medications for this visit.    OBJECTIVE: Vitals:   05/23/22 0849  BP: (!) 136/54  Pulse: 81  Temp: (!) 96.6 F (35.9 C)  SpO2: 98%      Body mass index is 52.03 kg/m.    ECOG FS:0 - Asymptomatic  General: Well-developed, well-nourished, no acute distress. Eyes: Pink conjunctiva, anicteric sclera. HEENT: Normocephalic, moist mucous membranes. Lungs: No audible wheezing or coughing. Heart: Regular rate and rhythm. Abdomen: Soft, nontender,  no obvious distention. Musculoskeletal: No edema, cyanosis, or clubbing. Neuro: Alert, answering all questions appropriately. Cranial nerves grossly intact. Skin: No rashes or petechiae noted. Psych: Normal affect.  LAB RESULTS:  Lab Results  Component Value Date   NA 133 (L) 05/23/2022   K 4.3 05/23/2022   CL 101 05/23/2022   CO2 22 05/23/2022   GLUCOSE 303 (H) 05/23/2022   BUN 36 (H) 05/23/2022   CREATININE 0.95 05/23/2022   CALCIUM 9.0 05/23/2022   PROT 7.4 05/23/2022   ALBUMIN 4.1 05/23/2022   AST 33 05/23/2022   ALT 28 05/23/2022   ALKPHOS 44 05/23/2022   BILITOT 0.6 05/23/2022   GFRNONAA >60 05/23/2022   GFRAA >60 10/25/2016    Lab Results  Component Value Date   WBC 3.9 (L) 05/23/2022   NEUTROABS 3.2 05/23/2022   HGB 10.7 (L) 05/23/2022   HCT 31.5 (L) 05/23/2022   MCV 95.7 05/23/2022   PLT 251 05/23/2022     STUDIES: No results found.  ASSESSMENT:Idiopathic cytopenia of undetermined significance.  PLAN:    Idiopathic cytopenia of undetermined significance: Bone marrow biopsy on January 03, 2021 revealed a hypercellular marrow with erythroid hyperplasia and dyserythropoiesis.  Patient initially thought to have MDS, but second opinion from [facility] did  not feel his bone marrow results supported this diagnosis.  No blasts, increased plasma cells, or clonality was noted.  Cytogenetics were reported as normal.  PET scan results from January 10, 2022 reviewed independently with no obvious evidence of underlying hypermetabolism suggestive of malignancy.  Patient has not required a blood transfusion since June 17, 2021.  His last Aranesp was given on January 31, 2022.  His fourth infusion of weekly Rituxan was given on February 28, 2022.  Patient's hemoglobin is stable and continues to be greater than 10.0, therefore no further treatments are needed at this time.  Return to clinic in 6 weeks for laboratory work only and then in 3 months for laboratory work and further evaluation.  Goal will be to maintain hemoglobin greater than 10.0 IgA deficiency: Patient reports he was diagnosed as a child, but has not had repeated infections throughout adulthood.  Recent and repeat IgA levels were undetectable with a normal IgM and IgG.  Patient also has anti-IgA antibodies.  Patients with isolated IgA deficiency can have anaphylactoid reactions to blood transfusions, therefor he needs matched and washed packed red blood cells.   Shortness of breath: Resolved.  Follow-up with pulmonary as indicated. Positive ANA: Likely clinically insignificant.   Leukopenia: Mild, monitor. Splenomegaly: Mild.  PET scan results as above.  Possibly related to underlying NASH.   NASH: Continue follow-up at [facility] GI clinic as scheduled. Hyperglycemia: Patient continues to have poor blood glucose control.  Continue follow-up and treatment per primary care.  I spent a total of 20 minutes reviewing chart data, face-to-face evaluation with the patient, counseling and coordination of care as detailed above.    Patient expressed understanding and was in agreement with this plan. He also understands that He can call clinic at any time with any questions, concerns, or complaints.    Jeralyn Ruths, MD   05/23/2022 10:24 AM

## 2022-06-02 ENCOUNTER — Encounter: Payer: Self-pay | Admitting: Oncology

## 2022-07-05 ENCOUNTER — Inpatient Hospital Stay: Payer: Medicare HMO | Attending: Oncology

## 2022-07-05 ENCOUNTER — Other Ambulatory Visit: Payer: Self-pay

## 2022-07-05 ENCOUNTER — Inpatient Hospital Stay: Payer: Medicare HMO

## 2022-07-05 DIAGNOSIS — D649 Anemia, unspecified: Secondary | ICD-10-CM

## 2022-07-05 DIAGNOSIS — D72819 Decreased white blood cell count, unspecified: Secondary | ICD-10-CM | POA: Insufficient documentation

## 2022-07-05 LAB — COMPREHENSIVE METABOLIC PANEL
ALT: 29 U/L (ref 0–44)
AST: 23 U/L (ref 15–41)
Albumin: 3.9 g/dL (ref 3.5–5.0)
Alkaline Phosphatase: 38 U/L (ref 38–126)
Anion gap: 8 (ref 5–15)
BUN: 34 mg/dL — ABNORMAL HIGH (ref 6–20)
CO2: 23 mmol/L (ref 22–32)
Calcium: 8.9 mg/dL (ref 8.9–10.3)
Chloride: 103 mmol/L (ref 98–111)
Creatinine, Ser: 0.91 mg/dL (ref 0.61–1.24)
GFR, Estimated: >60 mL/min (ref >60)
Glucose, Bld: 264 mg/dL — ABNORMAL HIGH (ref 70–99)
Potassium: 4.3 mmol/L (ref 3.5–5.1)
Sodium: 134 mmol/L — ABNORMAL LOW (ref 135–145)
Total Bilirubin: 0.6 mg/dL (ref 0.3–1.2)
Total Protein: 7.1 g/dL (ref 6.5–8.1)

## 2022-07-05 LAB — CBC WITH DIFFERENTIAL/PLATELET
Abs Immature Granulocytes: 0.08 10*3/uL — ABNORMAL HIGH (ref 0.00–0.07)
Basophils Absolute: 0 10*3/uL (ref 0.0–0.1)
Basophils Relative: 0 %
Eosinophils Absolute: 0 10*3/uL (ref 0.0–0.5)
Eosinophils Relative: 0 %
HCT: 30 % — ABNORMAL LOW (ref 39.0–52.0)
Hemoglobin: 10.2 g/dL — ABNORMAL LOW (ref 13.0–17.0)
Immature Granulocytes: 2 %
Lymphocytes Relative: 4 %
Lymphs Abs: 0.2 10*3/uL — ABNORMAL LOW (ref 0.7–4.0)
MCH: 33.1 pg (ref 26.0–34.0)
MCHC: 34 g/dL (ref 30.0–36.0)
MCV: 97.4 fL (ref 80.0–100.0)
Monocytes Absolute: 0.4 10*3/uL (ref 0.1–1.0)
Monocytes Relative: 11 %
Neutro Abs: 3 10*3/uL (ref 1.7–7.7)
Neutrophils Relative %: 83 %
Platelets: 237 10*3/uL (ref 150–400)
RBC: 3.08 MIL/uL — ABNORMAL LOW (ref 4.22–5.81)
RDW: 13.3 % (ref 11.5–15.5)
WBC: 3.6 10*3/uL — ABNORMAL LOW (ref 4.0–10.5)
nRBC: 0 % (ref 0.0–0.2)

## 2022-07-05 NOTE — Progress Notes (Signed)
Hgb 10.2- epo injection held today.

## 2022-08-22 ENCOUNTER — Other Ambulatory Visit: Payer: Self-pay

## 2022-08-22 DIAGNOSIS — D649 Anemia, unspecified: Secondary | ICD-10-CM

## 2022-08-23 ENCOUNTER — Ambulatory Visit: Payer: Medicare HMO | Admitting: Oncology

## 2022-08-23 ENCOUNTER — Encounter: Payer: Self-pay | Admitting: Medical Oncology

## 2022-08-23 ENCOUNTER — Inpatient Hospital Stay: Payer: Medicare HMO | Attending: Oncology

## 2022-08-23 ENCOUNTER — Inpatient Hospital Stay (HOSPITAL_BASED_OUTPATIENT_CLINIC_OR_DEPARTMENT_OTHER): Payer: Medicare HMO | Admitting: Medical Oncology

## 2022-08-23 ENCOUNTER — Inpatient Hospital Stay: Payer: Medicare HMO

## 2022-08-23 VITALS — BP 150/53 | HR 73 | Temp 97.9°F | Ht 67.0 in | Wt 332.0 lb

## 2022-08-23 DIAGNOSIS — D649 Anemia, unspecified: Secondary | ICD-10-CM | POA: Diagnosis not present

## 2022-08-23 DIAGNOSIS — R739 Hyperglycemia, unspecified: Secondary | ICD-10-CM | POA: Diagnosis not present

## 2022-08-23 DIAGNOSIS — K7581 Nonalcoholic steatohepatitis (NASH): Secondary | ICD-10-CM | POA: Diagnosis not present

## 2022-08-23 DIAGNOSIS — D802 Selective deficiency of immunoglobulin A [IgA]: Secondary | ICD-10-CM | POA: Diagnosis not present

## 2022-08-23 DIAGNOSIS — D469 Myelodysplastic syndrome, unspecified: Secondary | ICD-10-CM

## 2022-08-23 DIAGNOSIS — D759 Disease of blood and blood-forming organs, unspecified: Secondary | ICD-10-CM | POA: Insufficient documentation

## 2022-08-23 DIAGNOSIS — R161 Splenomegaly, not elsewhere classified: Secondary | ICD-10-CM | POA: Insufficient documentation

## 2022-08-23 DIAGNOSIS — D72819 Decreased white blood cell count, unspecified: Secondary | ICD-10-CM

## 2022-08-23 LAB — CBC WITH DIFFERENTIAL/PLATELET
Abs Immature Granulocytes: 0.08 10*3/uL — ABNORMAL HIGH (ref 0.00–0.07)
Basophils Absolute: 0 10*3/uL (ref 0.0–0.1)
Basophils Relative: 0 %
Eosinophils Absolute: 0 10*3/uL (ref 0.0–0.5)
Eosinophils Relative: 0 %
HCT: 32.5 % — ABNORMAL LOW (ref 39.0–52.0)
Hemoglobin: 11 g/dL — ABNORMAL LOW (ref 13.0–17.0)
Immature Granulocytes: 2 %
Lymphocytes Relative: 7 %
Lymphs Abs: 0.2 10*3/uL — ABNORMAL LOW (ref 0.7–4.0)
MCH: 32.6 pg (ref 26.0–34.0)
MCHC: 33.8 g/dL (ref 30.0–36.0)
MCV: 96.4 fL (ref 80.0–100.0)
Monocytes Absolute: 0.4 10*3/uL (ref 0.1–1.0)
Monocytes Relative: 10 %
Neutro Abs: 2.9 10*3/uL (ref 1.7–7.7)
Neutrophils Relative %: 81 %
Platelets: 252 10*3/uL (ref 150–400)
RBC: 3.37 MIL/uL — ABNORMAL LOW (ref 4.22–5.81)
RDW: 13.4 % (ref 11.5–15.5)
WBC: 3.6 10*3/uL — ABNORMAL LOW (ref 4.0–10.5)
nRBC: 0 % (ref 0.0–0.2)

## 2022-08-23 LAB — COMPREHENSIVE METABOLIC PANEL
ALT: 35 U/L (ref 0–44)
AST: 29 U/L (ref 15–41)
Albumin: 4.3 g/dL (ref 3.5–5.0)
Alkaline Phosphatase: 40 U/L (ref 38–126)
Anion gap: 8 (ref 5–15)
BUN: 39 mg/dL — ABNORMAL HIGH (ref 6–20)
CO2: 21 mmol/L — ABNORMAL LOW (ref 22–32)
Calcium: 9 mg/dL (ref 8.9–10.3)
Chloride: 101 mmol/L (ref 98–111)
Creatinine, Ser: 0.93 mg/dL (ref 0.61–1.24)
GFR, Estimated: >60 mL/min (ref >60)
Glucose, Bld: 270 mg/dL — ABNORMAL HIGH (ref 70–99)
Potassium: 4 mmol/L (ref 3.5–5.1)
Sodium: 130 mmol/L — ABNORMAL LOW (ref 135–145)
Total Bilirubin: 0.7 mg/dL (ref 0.3–1.2)
Total Protein: 7.3 g/dL (ref 6.5–8.1)

## 2022-08-23 NOTE — Progress Notes (Signed)
[facility]  Telephone:([phone] Fax:(336) [phone]  ID: Marcelino Scot OB: 1965/09/04  MR#: [id number]  UXL#:[id number]  Patient Care Team: Marina Goodell, MD as PCP - General (Family Medicine) Jeralyn Ruths, MD as Consulting Physician (Oncology)  CHIEF COMPLAINT: Idiopathic cytopenia of undetermined significance.    INTERVAL HISTORY: Patient returns to clinic today for repeat laboratory work and further evaluation.  At his last visit he expressed concerns over hyperglycemia that he was having. He has since seen his endocrinologist and fasting glucose values have improved some but still are above target (fasting around 300). He returns within the next few weeks for further insulin titration. He also sees his liver specialist at [facility] on 09/05/2022.   He does not complain of any weakness or fatigue today. He has no neurologic complaints.  He denies any recent fevers.  He has a fair appetite, but denies weight loss.  He denies any chest pain, shortness of breath, cough, or hemoptysis.  He denies any nausea, vomiting, constipation, or diarrhea.  He has no melena or hematochezia.  He has no urinary complaints.  Patient offers no further specific complaints today.  Wt Readings from Last 3 Encounters:  08/23/22 (!) 332 lb (150.6 kg)  05/23/22 (!) 332 lb 3.2 oz (150.7 kg)  03/28/22 (!) 332 lb 3.2 oz (150.7 kg)     REVIEW OF SYSTEMS:   Review of Systems  Constitutional: Negative.  Negative for fever, malaise/fatigue and weight loss.  Respiratory: Negative.  Negative for cough, hemoptysis and shortness of breath.   Cardiovascular: Negative.  Negative for chest pain and leg swelling.  Gastrointestinal: Negative.  Negative for abdominal pain, blood in stool and melena.  Genitourinary: Negative.  Negative for hematuria.  Musculoskeletal: Negative.  Negative for back pain.  Skin: Negative.  Negative for rash.  Neurological: Negative.  Negative for dizziness,  focal weakness, weakness and headaches.  Psychiatric/Behavioral: Negative.  The patient is not nervous/anxious.     As per HPI. Otherwise, a complete review of systems is negative.  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HCC)    Morbid obesity (HCC)    Sleep apnea     PAST SURGICAL HISTORY: Past Surgical History:  Procedure Laterality Date   COLONOSCOPY WITH PROPOFOL N/A 07/13/2020   Procedure: COLONOSCOPY WITH PROPOFOL;  Surgeon: Regis Bill, MD;  Location: [facility] ENDOSCOPY;  Service: Endoscopy;  Laterality: N/A;   COLONOSCOPY WITH PROPOFOL N/A 12/23/2020   Procedure: COLONOSCOPY WITH PROPOFOL;  Surgeon: Midge Minium, MD;  Location: [facility] ENDOSCOPY;  Service: Endoscopy;  Laterality: N/A;   ESOPHAGOGASTRODUODENOSCOPY (EGD) WITH PROPOFOL N/A 12/23/2020   Procedure: ESOPHAGOGASTRODUODENOSCOPY (EGD) WITH PROPOFOL;  Surgeon: Midge Minium, MD;  Location: [facility] ENDOSCOPY;  Service: Endoscopy;  Laterality: N/A;   GIVENS CAPSULE STUDY N/A 12/29/2020   Procedure: GIVENS CAPSULE STUDY;  Surgeon: Toney Reil, MD;  Location: [facility] ENDOSCOPY;  Service: Gastroenterology;  Laterality: N/A;   TOOTH EXTRACTION      FAMILY HISTORY: Family History  Problem Relation Age of Onset   Benign prostatic hyperplasia Father    Psoriasis Father    Hypertension Mother    Hyperlipidemia Mother    Diabetes Maternal Grandmother  Diabetes Paternal Grandmother     ADVANCED DIRECTIVES (Y/N):  N  HEALTH MAINTENANCE: Social History   Tobacco Use   Smoking status: Never   Smokeless tobacco: Never  Vaping Use   Vaping status: Never Used  Substance Use Topics   Alcohol use: No   Drug use: No     Colonoscopy:  PAP:  Bone density:  Lipid panel:  Allergies   Allergen Reactions   Ampicillin Diarrhea   Bactrim [Sulfamethoxazole-Trimethoprim] Hives   Cephalexin    Claritin [Loratadine] Other (See Comments)    Irritates throat   Penicillins Rash    Current Outpatient Medications  Medication Sig Dispense Refill   acarbose (PRECOSE) 25 MG tablet Take 25 mg by mouth 3 (three) times daily with meals.     acetaminophen (TYLENOL) 500 MG tablet Take 500 mg by mouth every 6 (six) hours as needed.     amLODipine (NORVASC) 10 MG tablet Take 10 mg by mouth daily.     Blood Pressure Monitor MISC Use 1 each once daily As directed     fenofibrate 160 MG tablet Take 160 mg by mouth daily.     fexofenadine (ALLEGRA) 180 MG tablet Take 180 mg by mouth daily.     fluticasone (FLONASE) 50 MCG/ACT nasal spray Place 2 sprays into both nostrils daily as needed.     glimepiride (AMARYL) 4 MG tablet Take 4 mg by mouth daily with breakfast.     HIBICLENS 4 % external liquid APPLY TOPICALLY DAILY AS NEEDED. TO WASHLEGS AND GROIN AREA 120 mL 3   Hydrocortisone-Aloe 1 % CREA Apply 1 application  topically 2 (two) times daily as needed (itching). To itchy area on legs     ketoconazole (NIZORAL) 2 % cream Apply to rash in groin once a day as needed for flares. 60 g 11   lidocaine (LMX) 4 % cream Apply 1 application topically as needed.     lovastatin (MEVACOR) 10 MG tablet Take 10 mg by mouth daily.     metFORMIN (GLUCOPHAGE-XR) 500 MG 24 hr tablet Take 500-1,000 mg by mouth 2 (two) times daily. Takes 1 tablet with breakfast and 2 tablets with supper     metoprolol tartrate (LOPRESSOR) 50 MG tablet Take 50 mg by mouth 2 (two) times daily.     Multiple Vitamins-Minerals (MULTIVITAMIN WITH MINERALS) tablet Take 1 tablet by mouth daily.     mupirocin ointment (BACTROBAN) 2 % Apply to boils and ulcers QD PRN flares. 22 g 3   omeprazole (PRILOSEC) 20 MG capsule Take 20 mg by mouth daily.     pioglitazone (ACTOS) 30 MG tablet Take 30 mg by mouth daily.     ramipril (ALTACE) 10  MG capsule Take 10 mg by mouth 2 (two) times daily.     spironolactone (ALDACTONE) 50 MG tablet Take 50 mg by mouth daily.     terazosin (HYTRIN) 10 MG capsule Take 10 mg by mouth daily.      torsemide (DEMADEX) 20 MG tablet Take 20 mg by mouth daily.     traZODone (DESYREL) 150 MG tablet Take 150 mg by mouth at bedtime.     TRESIBA FLEXTOUCH 200 UNIT/ML FlexTouch Pen Inject 48 Units into the skin in the morning. Taking 100 units of insulin     trolamine salicylate (ASPERCREME) 10 % cream Apply 1 application  topically as needed for muscle pain.     vitamin B-12 (CYANOCOBALAMIN) 1000 MCG tablet Take 1,000 mcg by mouth daily.  No current facility-administered medications for this visit.    OBJECTIVE: Vitals:   08/23/22 0836  BP: (!) 150/53  Pulse: 73  Temp: 97.9 F (36.6 C)  SpO2: 99%      Body mass index is 52 kg/m.    ECOG FS:0 - Asymptomatic  General: Well-developed, well-nourished, no acute distress. Eyes: Pink conjunctiva, anicteric sclera. HEENT: Normocephalic, moist mucous membranes. Lungs: No audible wheezing or coughing. Heart: Regular rate and rhythm. Abdomen: Soft Musculoskeletal: No edema, cyanosis, or clubbing. Neuro: Alert, answering all questions appropriately. Cranial nerves grossly intact. Skin: No rashes or petechiae noted. Psych: Normal affect.  LAB RESULTS:  Lab Results  Component Value Date   NA 130 (L) 08/23/2022   K 4.0 08/23/2022   CL 101 08/23/2022   CO2 21 (L) 08/23/2022   GLUCOSE 270 (H) 08/23/2022   BUN 39 (H) 08/23/2022   CREATININE 0.93 08/23/2022   CALCIUM 9.0 08/23/2022   PROT 7.3 08/23/2022   ALBUMIN 4.3 08/23/2022   AST 29 08/23/2022   ALT 35 08/23/2022   ALKPHOS 40 08/23/2022   BILITOT 0.7 08/23/2022   GFRNONAA >60 08/23/2022   GFRAA >60 10/25/2016    Lab Results  Component Value Date   WBC 3.6 (L) 08/23/2022   NEUTROABS 2.9 08/23/2022   HGB 11.0 (L) 08/23/2022   HCT 32.5 (L) 08/23/2022   MCV 96.4 08/23/2022   PLT  252 08/23/2022     STUDIES: No results found.  ASSESSMENT:Idiopathic cytopenia of undetermined significance.  PLAN:    Idiopathic cytopenia of undetermined significance: Bone marrow biopsy on January 03, 2021 revealed a hypercellular marrow with erythroid hyperplasia and dyserythropoiesis.  Patient initially thought to have MDS, but second opinion from [facility] did not feel his bone marrow results supported this diagnosis.  No blasts, increased plasma cells, or clonality was noted.  Cytogenetics were reported as normal.  PET scan results from January 10, 2022 reviewed independently with no obvious evidence of underlying hypermetabolism suggestive of malignancy.  Patient has not required a blood transfusion since June 17, 2021.  His last Aranesp was given on January 31, 2022.  His fourth infusion of weekly Rituxan was given on February 28, 2022.  His Hgb today is 11 which is the highest it has been in over 1 year. No Aranesp needed today. Goal will be to maintain hemoglobin greater than 10.0 IgA deficiency: Patient reports he was diagnosed as a child, but has not had repeated infections throughout adulthood.  Recent and repeat IgA levels were undetectable with a normal IgM and IgG.  Patient also has anti-IgA antibodies.  Patients with isolated IgA deficiency can have anaphylactoid reactions to blood transfusions, therefor he needs matched and washed packed red blood cells.  No change to plan today Shortness of breath: Resolved.  Follow-up with pulmonary as indicated. Positive ANA: Likely clinically insignificant.  NO changes in plan today Leukopenia: Mild, stable. Will continue to monitor Splenomegaly: Mild.  PET scan results as above.  Possibly related to underlying NASH.  No changes in plan today. Glad to hear that he will follow up with his specialist at [facility] this month.  NASH: Continue follow-up at [facility] GI clinic as scheduled. Hyperglycemia: Patient continues to have poor blood glucose control.   Continue follow-up and treatment per primary care.  Disposition RTC 6-8 weeks lab (CBC, CMP) +_ aranesp RTC 3 months MD, labs (CBC, CMP) +- aranesp -[place]  I spent a total of 20 minutes reviewing chart data, face-to-face evaluation with the patient, counseling  and coordination of care as detailed above.  Patient expressed understanding and was in agreement with this plan. He also understands that He can call clinic at any time with any questions, concerns, or complaints.    Rushie Chestnut, PA-C   08/23/2022 9:39 AM

## 2022-08-25 ENCOUNTER — Other Ambulatory Visit: Payer: Self-pay

## 2022-08-31 ENCOUNTER — Other Ambulatory Visit: Payer: Self-pay

## 2022-09-19 ENCOUNTER — Telehealth: Payer: Self-pay | Admitting: Oncology

## 2022-09-19 ENCOUNTER — Encounter: Payer: Self-pay | Admitting: Oncology

## 2022-09-19 NOTE — Telephone Encounter (Signed)
This patient called and needs his appointment rescheduled for the following week. He states he has a viral infection and is contagious Please advise when to r/s

## 2022-09-20 ENCOUNTER — Inpatient Hospital Stay: Payer: Medicare HMO

## 2022-09-20 ENCOUNTER — Other Ambulatory Visit: Payer: Self-pay

## 2022-09-21 ENCOUNTER — Ambulatory Visit: Payer: Medicare HMO | Admitting: Dermatology

## 2022-09-25 ENCOUNTER — Other Ambulatory Visit: Payer: Self-pay | Admitting: *Deleted

## 2022-09-25 DIAGNOSIS — D759 Disease of blood and blood-forming organs, unspecified: Secondary | ICD-10-CM

## 2022-09-27 ENCOUNTER — Inpatient Hospital Stay: Payer: Medicare HMO

## 2022-09-27 ENCOUNTER — Inpatient Hospital Stay: Payer: Medicare HMO | Attending: Oncology

## 2022-09-27 DIAGNOSIS — D759 Disease of blood and blood-forming organs, unspecified: Secondary | ICD-10-CM | POA: Insufficient documentation

## 2022-09-27 LAB — CBC WITH DIFFERENTIAL (CANCER CENTER ONLY)
Abs Immature Granulocytes: 0.1 10*3/uL — ABNORMAL HIGH (ref 0.00–0.07)
Basophils Absolute: 0 10*3/uL (ref 0.0–0.1)
Basophils Relative: 0 %
Eosinophils Absolute: 0 10*3/uL (ref 0.0–0.5)
Eosinophils Relative: 0 %
HCT: 31.3 % — ABNORMAL LOW (ref 39.0–52.0)
Hemoglobin: 10.2 g/dL — ABNORMAL LOW (ref 13.0–17.0)
Immature Granulocytes: 3 %
Lymphocytes Relative: 4 %
Lymphs Abs: 0.2 10*3/uL — ABNORMAL LOW (ref 0.7–4.0)
MCH: 32.2 pg (ref 26.0–34.0)
MCHC: 32.6 g/dL (ref 30.0–36.0)
MCV: 98.7 fL (ref 80.0–100.0)
Monocytes Absolute: 0.3 10*3/uL (ref 0.1–1.0)
Monocytes Relative: 7 %
Neutro Abs: 3.4 10*3/uL (ref 1.7–7.7)
Neutrophils Relative %: 86 %
Platelet Count: 223 10*3/uL (ref 150–400)
RBC: 3.17 MIL/uL — ABNORMAL LOW (ref 4.22–5.81)
RDW: 13.3 % (ref 11.5–15.5)
WBC Count: 3.9 10*3/uL — ABNORMAL LOW (ref 4.0–10.5)
nRBC: 0 % (ref 0.0–0.2)

## 2022-09-27 LAB — CMP (CANCER CENTER ONLY)
ALT: 26 U/L (ref 0–44)
AST: 23 U/L (ref 15–41)
Albumin: 3.8 g/dL (ref 3.5–5.0)
Alkaline Phosphatase: 37 U/L — ABNORMAL LOW (ref 38–126)
Anion gap: 6 (ref 5–15)
BUN: 37 mg/dL — ABNORMAL HIGH (ref 6–20)
CO2: 22 mmol/L (ref 22–32)
Calcium: 8.8 mg/dL — ABNORMAL LOW (ref 8.9–10.3)
Chloride: 105 mmol/L (ref 98–111)
Creatinine: 0.92 mg/dL (ref 0.61–1.24)
GFR, Estimated: >60 mL/min (ref >60)
Glucose, Bld: 246 mg/dL — ABNORMAL HIGH (ref 70–99)
Potassium: 4.3 mmol/L (ref 3.5–5.1)
Sodium: 133 mmol/L — ABNORMAL LOW (ref 135–145)
Total Bilirubin: 0.5 mg/dL (ref 0.3–1.2)
Total Protein: 6.9 g/dL (ref 6.5–8.1)

## 2022-10-18 ENCOUNTER — Ambulatory Visit: Payer: Medicare HMO | Admitting: Oncology

## 2022-10-18 ENCOUNTER — Other Ambulatory Visit: Payer: Medicare HMO

## 2022-10-18 ENCOUNTER — Ambulatory Visit: Payer: Medicare HMO

## 2022-11-14 ENCOUNTER — Ambulatory Visit: Payer: Medicare HMO | Admitting: Dermatology

## 2022-11-14 DIAGNOSIS — L304 Erythema intertrigo: Secondary | ICD-10-CM

## 2022-11-14 DIAGNOSIS — Z79899 Other long term (current) drug therapy: Secondary | ICD-10-CM | POA: Diagnosis not present

## 2022-11-14 DIAGNOSIS — L03119 Cellulitis of unspecified part of limb: Secondary | ICD-10-CM | POA: Diagnosis not present

## 2022-11-14 DIAGNOSIS — L732 Hidradenitis suppurativa: Secondary | ICD-10-CM

## 2022-11-14 DIAGNOSIS — Z7189 Other specified counseling: Secondary | ICD-10-CM

## 2022-11-14 MED ORDER — DOXYCYCLINE MONOHYDRATE 100 MG PO CAPS: ORAL_CAPSULE | ORAL | 11 refills | Status: DC

## 2022-11-14 MED ORDER — KETOCONAZOLE 2 % EX CREA: TOPICAL_CREAM | CUTANEOUS | 11 refills | Status: DC

## 2022-11-14 NOTE — Progress Notes (Signed)
Follow-Up Visit   Subjective  Patrick Duncan is a 57 y.o. male who presents for the following: Patient here for follow up on HS, Intertrigo, and history of cellulitis. Patient reports he is doing well and staying controlled with Ketoconazole cream and Hibiclens wash.  He reports no active areas of rash.    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 Treated by provider at [facility].    The patient has spots, moles and lesions to be evaluated, some may be new or changing and the patient may have concern these could be cancer.   The following portions of the chart were reviewed this encounter and updated as appropriate: medications, allergies, medical history  Review of Systems:  No other skin or systemic complaints except as noted in HPI or Assessment and Plan.  Objective  Well appearing patient in no apparent distress; mood and affect are within normal limits.    A focused examination was performed of the following areas: Legs, groin, buttocks  Relevant exam findings are noted in the Assessment and Plan.    Assessment & Plan   History of Cellulitis of lower extremity, unspecified laterality groin, thighs History of Non-pressure chronic ulcer of skin of And  Cellulitis - improved   Hx of drainage but doesn't appear to be infected today  - Culture + for Staph aureus sensitive to Sulfa and Oxicillin (resistant to tetracycline); but pt has allergy to Sulfa and Penicillin and Cephalexin. Currently clear today.   Chronic condition with duration or expected duration over one year. Currently well-controlled.  Continue Ketoconazole 2% cream apply to affected skin daily Continue Hibiclens wash     Intertrigo groin, buttock  Exam: Clear at exam  Chronic and persistent condition with duration or expected duration over one year. Condition is improving with treatment but not currently at goal.   Intertrigo is a chronic recurrent rash that occurs in skin  fold areas that may be associated with friction; heat; moisture; yeast; fungus; and bacteria.  It is exacerbated by increased movement / activity; sweating; and higher atmospheric temperature.    Improved with current treatment.   Continue ketoconazole 2% cream Apply to AAs QD prn rash dsp 60g 5Rf. Put on hold    Hidradenitis suppurativa Groin  Exam: Clear at exam  Chronic condition with duration or expected duration over one year. Currently well-controlled.    Hidradenitis Suppurativa is a chronic; persistent; non-curable, but treatable condition due to abnormal inflamed sweat glands in the body folds (axilla, inframammary, groin, medial thighs), causing recurrent painful draining cysts and scarring. It can be associated with severe scarring acne and cysts; also abscesses and scarring of scalp. The goal is control and prevention of flares, as it is not curable. Scars are permanent and can be thickened. Treatment may include daily use of topical medication and oral antibiotics.  Oral isotretinoin may also be helpful.  For more severe cases, Humira (a biologic injection) may be prescribed to decrease the inflammatory process and prevent flares.  When indicated, inflamed cysts may also be treated surgically.    Patient not currently flared but will send in rx of doxycycline today for patient to use as needed.   Start doxycycline 100 mg tab - take 1 tab bid for 2 weeks as needed for flares. 28 tabs 1 year refills  Doxycycline should be taken with food to prevent nausea. Do not lay down for 30 minutes after taking. Be cautious with sun exposure and use good  sun protection while on this medication. Pregnant women should not take this medication.    Return in about 1 year (around 11/14/2023) for intertrigo, HS, hx of cellulitis.  IAsher Muir, CMA, am acting as scribe for Armida Sans, MD.   Documentation: I have reviewed the above documentation for accuracy and completeness, and I agree  with the above.  Armida Sans, MD

## 2022-11-14 NOTE — Patient Instructions (Signed)

## 2022-11-16 ENCOUNTER — Other Ambulatory Visit: Payer: Self-pay

## 2022-11-23 ENCOUNTER — Ambulatory Visit: Payer: Medicare HMO

## 2022-11-23 ENCOUNTER — Other Ambulatory Visit: Payer: Medicare HMO

## 2022-11-23 ENCOUNTER — Ambulatory Visit: Payer: Medicare HMO | Admitting: Oncology

## 2022-11-25 ENCOUNTER — Encounter: Payer: Self-pay | Admitting: Dermatology

## 2022-11-29 ENCOUNTER — Other Ambulatory Visit: Payer: Self-pay

## 2022-11-29 DIAGNOSIS — D759 Disease of blood and blood-forming organs, unspecified: Secondary | ICD-10-CM

## 2022-11-30 ENCOUNTER — Inpatient Hospital Stay: Payer: Medicare HMO

## 2022-11-30 ENCOUNTER — Inpatient Hospital Stay (HOSPITAL_BASED_OUTPATIENT_CLINIC_OR_DEPARTMENT_OTHER): Payer: Medicare HMO | Admitting: Oncology

## 2022-11-30 ENCOUNTER — Inpatient Hospital Stay: Payer: Medicare HMO | Attending: Oncology

## 2022-11-30 ENCOUNTER — Encounter: Payer: Self-pay | Admitting: Oncology

## 2022-11-30 VITALS — BP 144/54 | HR 73 | Temp 96.9°F | Resp 19 | Wt 328.2 lb

## 2022-11-30 DIAGNOSIS — D802 Selective deficiency of immunoglobulin A [IgA]: Secondary | ICD-10-CM | POA: Diagnosis not present

## 2022-11-30 DIAGNOSIS — D759 Disease of blood and blood-forming organs, unspecified: Secondary | ICD-10-CM | POA: Diagnosis present

## 2022-11-30 DIAGNOSIS — D649 Anemia, unspecified: Secondary | ICD-10-CM | POA: Diagnosis not present

## 2022-11-30 DIAGNOSIS — R161 Splenomegaly, not elsewhere classified: Secondary | ICD-10-CM | POA: Diagnosis not present

## 2022-11-30 DIAGNOSIS — E1165 Type 2 diabetes mellitus with hyperglycemia: Secondary | ICD-10-CM | POA: Insufficient documentation

## 2022-11-30 DIAGNOSIS — K7581 Nonalcoholic steatohepatitis (NASH): Secondary | ICD-10-CM | POA: Insufficient documentation

## 2022-11-30 LAB — CMP (CANCER CENTER ONLY)
ALT: 30 U/L (ref 0–44)
AST: 24 U/L (ref 15–41)
Albumin: 4 g/dL (ref 3.5–5.0)
Alkaline Phosphatase: 44 U/L (ref 38–126)
Anion gap: 9 (ref 5–15)
BUN: 34 mg/dL — ABNORMAL HIGH (ref 6–20)
CO2: 24 mmol/L (ref 22–32)
Calcium: 9.5 mg/dL (ref 8.9–10.3)
Chloride: 101 mmol/L (ref 98–111)
Creatinine: 0.92 mg/dL (ref 0.61–1.24)
GFR, Estimated: >60 mL/min (ref >60)
Glucose, Bld: 243 mg/dL — ABNORMAL HIGH (ref 70–99)
Potassium: 4.2 mmol/L (ref 3.5–5.1)
Sodium: 134 mmol/L — ABNORMAL LOW (ref 135–145)
Total Bilirubin: 0.7 mg/dL (ref <1.2)
Total Protein: 7.3 g/dL (ref 6.5–8.1)

## 2022-11-30 LAB — CBC WITH DIFFERENTIAL (CANCER CENTER ONLY)
Abs Immature Granulocytes: 0.08 10*3/uL — ABNORMAL HIGH (ref 0.00–0.07)
Basophils Absolute: 0 10*3/uL (ref 0.0–0.1)
Basophils Relative: 0 %
Eosinophils Absolute: 0 10*3/uL (ref 0.0–0.5)
Eosinophils Relative: 0 %
HCT: 32.6 % — ABNORMAL LOW (ref 39.0–52.0)
Hemoglobin: 11.2 g/dL — ABNORMAL LOW (ref 13.0–17.0)
Immature Granulocytes: 2 %
Lymphocytes Relative: 5 %
Lymphs Abs: 0.2 10*3/uL — ABNORMAL LOW (ref 0.7–4.0)
MCH: 32.3 pg (ref 26.0–34.0)
MCHC: 34.4 g/dL (ref 30.0–36.0)
MCV: 93.9 fL (ref 80.0–100.0)
Monocytes Absolute: 0.5 10*3/uL (ref 0.1–1.0)
Monocytes Relative: 12 %
Neutro Abs: 3.3 10*3/uL (ref 1.7–7.7)
Neutrophils Relative %: 81 %
Platelet Count: 246 10*3/uL (ref 150–400)
RBC: 3.47 MIL/uL — ABNORMAL LOW (ref 4.22–5.81)
RDW: 13.6 % (ref 11.5–15.5)
WBC Count: 4 10*3/uL (ref 4.0–10.5)
nRBC: 0 % (ref 0.0–0.2)

## 2022-11-30 NOTE — Progress Notes (Signed)
Patient states he has been fatigue since Tuesday of last week. Patient states he believe his blood may be low again but he is unsure.

## 2022-11-30 NOTE — Progress Notes (Signed)
[facility]  [phone] [phone]  ID: Patrick Duncan OB: 22-Nov-1965  MR#: [id number]  XLK#:[id number]  Patient Care Team: Marina Goodell, MD as PCP - General (Family Medicine) Jeralyn Ruths, MD as Consulting Physician (Oncology)  CHIEF COMPLAINT: Idiopathic cytopenia of undetermined significance.    INTERVAL HISTORY: Patient returns to clinic today for repeat laboratory work and further evaluation.  He reports increased fatigue over the last week, but otherwise has felt well. He has no neurologic complaints.  He denies any recent fevers.  He has a fair appetite, but denies weight loss.  He denies any chest pain, shortness of breath, cough, or hemoptysis.  He denies any nausea, vomiting, constipation, or diarrhea.  He has no melena or hematochezia.  He has no urinary complaints.  Patient offers no further specific complaints today.  REVIEW OF SYSTEMS:   Review of Systems  Constitutional:  Positive for malaise/fatigue. Negative for fever and weight loss.  Respiratory: Negative.  Negative for cough, hemoptysis and shortness of breath.   Cardiovascular: Negative.  Negative for chest pain and leg swelling.  Gastrointestinal: Negative.  Negative for abdominal pain, blood in stool and melena.  Genitourinary: Negative.  Negative for hematuria.  Musculoskeletal: Negative.  Negative for back pain.  Skin: Negative.  Negative for rash.  Neurological: Negative.  Negative for dizziness, focal weakness, weakness and headaches.  Psychiatric/Behavioral: Negative.  The patient is not nervous/anxious.     As per HPI. Otherwise, a complete review of systems is negative.  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HCC)     Morbid obesity (HCC)    Sleep apnea     PAST SURGICAL HISTORY: Past Surgical History:  Procedure Laterality Date   COLONOSCOPY WITH PROPOFOL N/A 07/13/2020   Procedure: COLONOSCOPY WITH PROPOFOL;  Surgeon: Regis Bill, MD;  Location: [facility] ENDOSCOPY;  Service: Endoscopy;  Laterality: N/A;   COLONOSCOPY WITH PROPOFOL N/A 12/23/2020   Procedure: COLONOSCOPY WITH PROPOFOL;  Surgeon: Midge Minium, MD;  Location: [facility] ENDOSCOPY;  Service: Endoscopy;  Laterality: N/A;   ESOPHAGOGASTRODUODENOSCOPY (EGD) WITH PROPOFOL N/A 12/23/2020   Procedure: ESOPHAGOGASTRODUODENOSCOPY (EGD) WITH PROPOFOL;  Surgeon: Midge Minium, MD;  Location: [facility] ENDOSCOPY;  Service: Endoscopy;  Laterality: N/A;   GIVENS CAPSULE STUDY N/A 12/29/2020   Procedure: GIVENS CAPSULE STUDY;  Surgeon: Toney Reil, MD;  Location: [facility] ENDOSCOPY;  Service: Gastroenterology;  Laterality: N/A;   TOOTH EXTRACTION      FAMILY HISTORY: Family History  Problem Relation Age of Onset   Benign prostatic hyperplasia Father    Psoriasis Father    Hypertension Mother    Hyperlipidemia Mother    Diabetes Maternal Grandmother    Diabetes Paternal Grandmother     ADVANCED DIRECTIVES (Y/N):  N  HEALTH MAINTENANCE: Social History   Tobacco Use   Smoking status: Never   Smokeless tobacco: Never  Vaping Use   Vaping status: Never Used  Substance Use Topics   Alcohol use: No   Drug use: No     Colonoscopy:  PAP:  Bone density:  Lipid panel:  Allergies  Allergen Reactions   Ampicillin Diarrhea   Bactrim [Sulfamethoxazole-Trimethoprim] Hives   Cephalexin    Claritin [  Loratadine] Other (See Comments)    Irritates throat   Penicillins Rash    Current Outpatient Medications  Medication Sig Dispense Refill   acarbose (PRECOSE) 25 MG tablet Take 25 mg by mouth 3 (three) times daily with meals.     acetaminophen (TYLENOL) 500 MG tablet Take 500 mg by mouth every 6 (six) hours as needed.     amLODipine (NORVASC) 10  MG tablet Take 10 mg by mouth daily.     Blood Pressure Monitor MISC Use 1 each once daily As directed     Continuous Glucose Sensor (FREESTYLE LIBRE 3 PLUS SENSOR) MISC by Does not apply route. Change sensor every 15 days.     doxycycline (MONODOX) 100 MG capsule Take 1 capsule twice daily with food and drink when flared for 2 weeks. Prn for flares. 28 capsule 11   fenofibrate 160 MG tablet Take 160 mg by mouth daily.     fexofenadine (ALLEGRA) 180 MG tablet Take 180 mg by mouth daily.     fluticasone (FLONASE) 50 MCG/ACT nasal spray Place 2 sprays into both nostrils daily as needed.     glimepiride (AMARYL) 4 MG tablet Take 4 mg by mouth daily with breakfast.     HIBICLENS 4 % external liquid APPLY TOPICALLY DAILY AS NEEDED. TO WASHLEGS AND GROIN AREA 120 mL 3   Hydrocortisone-Aloe 1 % CREA Apply 1 application  topically 2 (two) times daily as needed (itching). To itchy area on legs     insulin degludec (TRESIBA) 200 UNIT/ML FlexTouch Pen Inject 120 Units into the skin in the morning.     ketoconazole (NIZORAL) 2 % cream Apply to rash in groin once a day as needed for flares. 60 g 11   latanoprost (XALATAN) 0.005 % ophthalmic solution Place 1 drop into the right eye at bedtime.     lidocaine (LMX) 4 % cream Apply 1 application topically as needed.     lovastatin (MEVACOR) 10 MG tablet Take 10 mg by mouth daily.     metFORMIN (GLUCOPHAGE-XR) 500 MG 24 hr tablet Take 2 tablets by mouth 2 (two) times daily.     metoprolol tartrate (LOPRESSOR) 50 MG tablet Take 50 mg by mouth 2 (two) times daily.     Multiple Vitamins-Minerals (MULTIVITAMIN WITH MINERALS) tablet Take 1 tablet by mouth daily.     mupirocin ointment (BACTROBAN) 2 % Apply to boils and ulcers QD PRN flares. 22 g 3   omeprazole (PRILOSEC) 20 MG capsule Take 20 mg by mouth daily.     pioglitazone (ACTOS) 30 MG tablet Take 30 mg by mouth daily.     ramipril (ALTACE) 10 MG capsule Take 10 mg by mouth 2 (two) times daily.      spironolactone (ALDACTONE) 50 MG tablet Take 50 mg by mouth daily.     terazosin (HYTRIN) 10 MG capsule Take 10 mg by mouth daily.      torsemide (DEMADEX) 20 MG tablet Take 20 mg by mouth daily.     traZODone (DESYREL) 150 MG tablet Take 150 mg by mouth at bedtime.     trolamine salicylate (ASPERCREME) 10 % cream Apply 1 application  topically as needed for muscle pain.     vitamin B-12 (CYANOCOBALAMIN) 1000 MCG tablet Take 1,000 mcg by mouth daily.     No current facility-administered medications for this visit.    OBJECTIVE: Vitals:   11/30/22 0843  BP: (!) 144/54  Pulse: 73  Resp: 19  Temp: (!) 96.9 F (36.1  C)  SpO2: 98%      Body mass index is 51.4 kg/m.    ECOG FS:0 - Asymptomatic  General: Well-developed, well-nourished, no acute distress. Eyes: Pink conjunctiva, anicteric sclera. HEENT: Normocephalic, moist mucous membranes. Lungs: No audible wheezing or coughing. Heart: Regular rate and rhythm. Abdomen: Soft, nontender, no obvious distention. Musculoskeletal: No edema, cyanosis, or clubbing. Neuro: Alert, answering all questions appropriately. Cranial nerves grossly intact. Skin: No rashes or petechiae noted. Psych: Normal affect.  LAB RESULTS:  Lab Results  Component Value Date   NA 134 (L) 11/30/2022   K 4.2 11/30/2022   CL 101 11/30/2022   CO2 24 11/30/2022   GLUCOSE 243 (H) 11/30/2022   BUN 34 (H) 11/30/2022   CREATININE 0.92 11/30/2022   CALCIUM 9.5 11/30/2022   PROT 7.3 11/30/2022   ALBUMIN 4.0 11/30/2022   AST 24 11/30/2022   ALT 30 11/30/2022   ALKPHOS 44 11/30/2022   BILITOT 0.7 11/30/2022   GFRNONAA >60 11/30/2022   GFRAA >60 10/25/2016    Lab Results  Component Value Date   WBC 4.0 11/30/2022   NEUTROABS 3.3 11/30/2022   HGB 11.2 (L) 11/30/2022   HCT 32.6 (L) 11/30/2022   MCV 93.9 11/30/2022   PLT 246 11/30/2022     STUDIES: No results found.  ASSESSMENT:Idiopathic cytopenia of undetermined significance.  PLAN:     Idiopathic cytopenia of undetermined significance: Bone marrow biopsy on January 03, 2021 revealed a hypercellular marrow with erythroid hyperplasia and dyserythropoiesis.  Patient initially thought to have MDS, but second opinion from [facility] did not feel his bone marrow results supported this diagnosis.  No blasts, increased plasma cells, or clonality was noted.  Cytogenetics were reported as normal.  PET scan results from January 10, 2022 reviewed independently with no obvious evidence of underlying hypermetabolism suggestive of malignancy.  Patient has not required a blood transfusion since June 17, 2021.  His last Aranesp was given on January 31, 2022.  His fourth infusion of weekly Rituxan was given on February 28, 2022.  Patient's hemoglobin continues to trend up and is now 11.2.  No further treatments are needed at this time.  Return to clinic in 2 and 4 months for laboratory work only and then in 6 months for laboratory work and further evaluation.  If patient's hemoglobin remained stable, can possibly consider discharging from clinic.  Goal will be to maintain hemoglobin greater than 10.0 IgA deficiency: Patient reports he was diagnosed as a child, but has not had repeated infections throughout adulthood.  Recent and repeat IgA levels were undetectable with a normal IgM and IgG.  Patient also has anti-IgA antibodies.  Patients with isolated IgA deficiency can have anaphylactoid reactions to blood transfusions, therefor he needs matched and washed packed red blood cells.   Shortness of breath: Resolved.  Follow-up with pulmonary as indicated. Positive ANA: Likely clinically insignificant.   Leukopenia: Resolved. Splenomegaly: Mild.  PET scan results as above.  Possibly related to underlying NASH.   NASH: Continue follow-up at [facility] clinic as scheduled. Hyperglycemia: Patient continues to have poor blood glucose control.  Continue follow-up and treatment per primary care.  Patient expressed  understanding and was in agreement with this plan. He also understands that He can call clinic at any time with any questions, concerns, or complaints.    Jeralyn Ruths, MD   11/30/2022 3:56 PM

## 2023-01-30 ENCOUNTER — Ambulatory Visit: Payer: Medicare HMO

## 2023-01-30 ENCOUNTER — Inpatient Hospital Stay: Payer: Medicare HMO | Attending: Oncology

## 2023-01-30 ENCOUNTER — Inpatient Hospital Stay: Payer: Medicare HMO

## 2023-01-30 ENCOUNTER — Other Ambulatory Visit: Payer: Medicare HMO

## 2023-01-30 DIAGNOSIS — D759 Disease of blood and blood-forming organs, unspecified: Secondary | ICD-10-CM | POA: Diagnosis present

## 2023-01-30 DIAGNOSIS — D649 Anemia, unspecified: Secondary | ICD-10-CM

## 2023-01-30 LAB — CBC WITH DIFFERENTIAL/PLATELET
Abs Immature Granulocytes: 0.04 10*3/uL (ref 0.00–0.07)
Basophils Absolute: 0 10*3/uL (ref 0.0–0.1)
Basophils Relative: 0 %
Eosinophils Absolute: 0 10*3/uL (ref 0.0–0.5)
Eosinophils Relative: 0 %
HCT: 33 % — ABNORMAL LOW (ref 39.0–52.0)
Hemoglobin: 11.3 g/dL — ABNORMAL LOW (ref 13.0–17.0)
Immature Granulocytes: 1 %
Lymphocytes Relative: 4 %
Lymphs Abs: 0.2 10*3/uL — ABNORMAL LOW (ref 0.7–4.0)
MCH: 32.2 pg (ref 26.0–34.0)
MCHC: 34.2 g/dL (ref 30.0–36.0)
MCV: 94 fL (ref 80.0–100.0)
Monocytes Absolute: 0.5 10*3/uL (ref 0.1–1.0)
Monocytes Relative: 11 %
Neutro Abs: 3.4 10*3/uL (ref 1.7–7.7)
Neutrophils Relative %: 84 %
Platelets: 254 10*3/uL (ref 150–400)
RBC: 3.51 MIL/uL — ABNORMAL LOW (ref 4.22–5.81)
RDW: 13.2 % (ref 11.5–15.5)
WBC: 4.1 10*3/uL (ref 4.0–10.5)
nRBC: 0 % (ref 0.0–0.2)

## 2023-01-30 NOTE — Progress Notes (Signed)
 Hgb 11.3 today will hold aranesp

## 2023-02-16 ENCOUNTER — Ambulatory Visit (INDEPENDENT_AMBULATORY_CARE_PROVIDER_SITE_OTHER): Payer: Medicare HMO | Admitting: Nurse Practitioner

## 2023-02-16 ENCOUNTER — Encounter (INDEPENDENT_AMBULATORY_CARE_PROVIDER_SITE_OTHER): Payer: Self-pay | Admitting: Nurse Practitioner

## 2023-02-16 ENCOUNTER — Encounter: Payer: Self-pay | Admitting: Oncology

## 2023-02-16 VITALS — BP 179/69 | HR 76 | Resp 18 | Ht 67.0 in | Wt 335.0 lb

## 2023-02-16 DIAGNOSIS — L03116 Cellulitis of left lower limb: Secondary | ICD-10-CM | POA: Diagnosis not present

## 2023-02-16 DIAGNOSIS — I10 Essential (primary) hypertension: Secondary | ICD-10-CM

## 2023-02-16 DIAGNOSIS — M7989 Other specified soft tissue disorders: Secondary | ICD-10-CM

## 2023-02-16 NOTE — Progress Notes (Incomplete)
Subjective:    Patient ID: Patrick Duncan, male    DOB: Mar 06, 1965, 58 y.o.   MRN: [id number] Chief Complaint  Patient presents with  . New Patient (Initial Visit)    np. consult. bilateral LE edema. feldpausch, dale.    HPI  Review of Systems     Objective:   Physical Exam  BP (!) 179/69   Pulse 76   Resp 18   Ht 5\' 7"  (1.702 m)   Wt (!) 335 lb (152 kg)   BMI 52.47 kg/m   Past Medical History:  Diagnosis Date  . Arthritis   . Cellulitis and abscess of left leg   . Depression   . Diabetes mellitus without complication (HCC)   . Hearing loss   . History of IBS   . HLD (hyperlipidemia)   . Hypertension   . IgA deficiency (HCC)   . Morbid obesity (HCC)   . Sleep apnea     Social History   Socioeconomic History  . Marital status: Married    Spouse name: Not on file  . Number of children: Not on file  . Years of education: Not on file  . Highest education level: Not on file  Occupational History  . Not on file  Tobacco Use  . Smoking status: Never  . Smokeless tobacco: Never  Vaping Use  . Vaping status: Never Used  Substance and Sexual Activity  . Alcohol use: No  . Drug use: No  . Sexual activity: Not on file  Other Topics Concern  . Not on file  Social History Narrative  . Not on file   Social Drivers of Health   Financial Resource Strain: Low Risk  (12/29/2022)   Received from [facility]   Overall Financial Resource Strain (CARDIA)   . Difficulty of Paying Living Expenses: Not hard at all  Food Insecurity: No Food Insecurity (12/29/2022)   Received from [facility]   Hunger Vital Sign   . Worried About Programme researcher, broadcasting/film/video in the Last Year: Never true   . Ran Out of Food in the Last Year: Never true  Transportation Needs: No Transportation Needs (12/29/2022)   Received from [facility]   [facility] - Transportation   . In the past 12 months, has lack of transportation kept you from  medical appointments or from getting medications?: No   . Lack of Transportation (Non-Medical): No  Physical Activity: Not on file  Stress: No Stress Concern Present (03/18/2021)   Received from [facility], [facility]   [facility] - Occupational Stress Questionnaire   . Feeling of Stress : Only a little  Social Connections: Not on file  Intimate Partner Violence: Not on file    Past Surgical History:  Procedure Laterality Date  . COLONOSCOPY WITH PROPOFOL N/A 07/13/2020   Procedure: COLONOSCOPY WITH PROPOFOL;  Surgeon: Regis Bill, MD;  Location: [facility] ENDOSCOPY;  Service: Endoscopy;  Laterality: N/A;  . COLONOSCOPY WITH PROPOFOL N/A 12/23/2020   Procedure: COLONOSCOPY WITH PROPOFOL;  Surgeon: Midge Minium, MD;  Location: [facility] ENDOSCOPY;  Service: Endoscopy;  Laterality: N/A;  . ESOPHAGOGASTRODUODENOSCOPY (EGD) WITH PROPOFOL N/A 12/23/2020   Procedure: ESOPHAGOGASTRODUODENOSCOPY (EGD) WITH PROPOFOL;  Surgeon: Midge Minium, MD;  Location: [facility] ENDOSCOPY;  Service: Endoscopy;  Laterality: N/A;  . GIVENS CAPSULE STUDY N/A 12/29/2020   Procedure: GIVENS CAPSULE STUDY;  Surgeon: Toney Reil, MD;  Location: [facility] ENDOSCOPY;  Service: Gastroenterology;  Laterality: N/A;  . TOOTH EXTRACTION      Family History  Problem Relation Age of Onset  . Benign prostatic hyperplasia Father   . Psoriasis Father   . Hypertension Mother   . Hyperlipidemia Mother   . Diabetes Maternal Grandmother   . Diabetes Paternal Grandmother     Allergies  Allergen Reactions  . Ampicillin Diarrhea  . Bactrim [Sulfamethoxazole-Trimethoprim] Hives  . Cephalexin   . Claritin [Loratadine] Other (See Comments)    Irritates throat  . Penicillins Rash       Latest Ref Rng & Units 01/30/2023    7:58 AM 11/30/2022    8:01 AM 09/27/2022    8:02 AM  CBC  WBC 4.0 - 10.5 K/uL 4.1  4.0  3.9   Hemoglobin 13.0 - 17.0 g/dL 09.8  11.9  14.7   Hematocrit 39.0 - 52.0 %  33.0  32.6  31.3   Platelets 150 - 400 K/uL 254  246  223       CMP     Component Value Date/Time   NA 134 (L) 11/30/2022 0801   K 4.2 11/30/2022 0801   CL 101 11/30/2022 0801   CO2 24 11/30/2022 0801   GLUCOSE 243 (H) 11/30/2022 0801   BUN 34 (H) 11/30/2022 0801   CREATININE 0.92 11/30/2022 0801   CALCIUM 9.5 11/30/2022 0801   PROT 7.3 11/30/2022 0801   ALBUMIN 4.0 11/30/2022 0801   AST 24 11/30/2022 0801   ALT 30 11/30/2022 0801   ALKPHOS 44 11/30/2022 0801   BILITOT 0.7 11/30/2022 0801   GFRNONAA >60 11/30/2022 0801     No results found.     Assessment & Plan:   1. Cellulitis of left lower extremity (Primary) ***  2. Primary hypertension ***  3. Leg swelling ***   Current Outpatient Medications on File Prior to Visit  Medication Sig Dispense Refill  . acarbose (PRECOSE) 25 MG tablet Take 25 mg by mouth 3 (three) times daily with meals.    Marland Kitchen acetaminophen (TYLENOL) 500 MG tablet Take 500 mg by mouth every 6 (six) hours as needed.    Marland Kitchen amLODipine (NORVASC) 10 MG tablet Take 10 mg by mouth daily.    . Blood Pressure Monitor MISC Use 1 each once daily As directed    . Continuous Glucose Sensor (FREESTYLE LIBRE 3 PLUS SENSOR) MISC by Does not apply route. Change sensor every 15 days.    Marland Kitchen doxycycline (MONODOX) 100 MG capsule Take 1 capsule twice daily with food and drink when flared for 2 weeks. Prn for flares. 28 capsule 11  . fenofibrate 160 MG tablet Take 160 mg by mouth daily.    . fexofenadine (ALLEGRA) 180 MG tablet Take 180 mg by mouth daily.    . fluticasone (FLONASE) 50 MCG/ACT nasal spray Place 2 sprays into both nostrils daily as needed.    Marland Kitchen glimepiride (AMARYL) 4 MG tablet Take 4 mg by mouth daily with breakfast.    . HIBICLENS 4 % external liquid APPLY TOPICALLY DAILY AS NEEDED. TO WASHLEGS AND GROIN AREA 120 mL 3  . Hydrocortisone-Aloe 1 % CREA Apply 1 application  topically 2 (two) times daily as needed (itching). To itchy area on legs    .  insulin degludec (TRESIBA) 200 UNIT/ML FlexTouch Pen Inject 120 Units into the skin in the morning.    Marland Kitchen ketoconazole (NIZORAL) 2 % cream Apply to rash in groin once a day as needed for flares. 60 g 11  . latanoprost (  XALATAN) 0.005 % ophthalmic solution Place 1 drop into the right eye at bedtime.    . lidocaine (LMX) 4 % cream Apply 1 application topically as needed.    . lovastatin (MEVACOR) 10 MG tablet Take 10 mg by mouth daily.    . metFORMIN (GLUCOPHAGE-XR) 500 MG 24 hr tablet Take 2 tablets by mouth 2 (two) times daily.    . metoprolol tartrate (LOPRESSOR) 50 MG tablet Take 50 mg by mouth 2 (two) times daily.    . Multiple Vitamins-Minerals (MULTIVITAMIN WITH MINERALS) tablet Take 1 tablet by mouth daily.    . mupirocin ointment (BACTROBAN) 2 % Apply to boils and ulcers QD PRN flares. 22 g 3  . omeprazole (PRILOSEC) 20 MG capsule Take 20 mg by mouth daily.    . pioglitazone (ACTOS) 30 MG tablet Take 30 mg by mouth daily.    . ramipril (ALTACE) 10 MG capsule Take 10 mg by mouth 2 (two) times daily.    Marland Kitchen spironolactone (ALDACTONE) 50 MG tablet Take 50 mg by mouth daily.    Marland Kitchen terazosin (HYTRIN) 10 MG capsule Take 10 mg by mouth daily.     Marland Kitchen torsemide (DEMADEX) 20 MG tablet Take 20 mg by mouth daily.    . traZODone (DESYREL) 150 MG tablet Take 150 mg by mouth at bedtime.    . trolamine salicylate (ASPERCREME) 10 % cream Apply 1 application  topically as needed for muscle pain.    . vitamin B-12 (CYANOCOBALAMIN) 1000 MCG tablet Take 1,000 mcg by mouth daily.     No current facility-administered medications on file prior to visit.    There are no Patient Instructions on file for this visit. No follow-ups on file.   Georgiana Spinner, NP

## 2023-02-16 NOTE — Progress Notes (Signed)
Subjective:    Patient ID: Patrick Duncan, male    DOB: [birth year], 58 y.o.   MRN: [id number] Chief Complaint  Patient presents with   New Patient (Initial Visit)    np. consult. bilateral LE edema. feldpausch, dale.    The patient is a 58 year old male who presents today from his primary care Dr. Maryjane Hurter and relation to cellulitis and lower extremity edema.  He has been swelling over the last few months.  He is actually had episodes of cellulitis that have happened on and off for the last few years.  He notes that he is initial symptoms tend to be itching of the area.  He was most recently treated with doxycycline and steroids.  He does wear medical grade compression and due to history of hidradenitis suppurativa he uses Hibiclens and he notes that this is actually decreased his recurrence of cellulitis.  Currently there is no evidence of cellulitis and no open wounds or ulcerations.    Review of Systems  Cardiovascular:  Positive for leg swelling.  Musculoskeletal:  Positive for arthralgias and gait problem.  All other systems reviewed and are negative.      Objective:   Physical Exam Vitals reviewed.  HENT:     Head: Normocephalic.  Cardiovascular:     Rate and Rhythm: Normal rate.     Pulses:          Dorsalis pedis pulses are 2+ on the left side.  Pulmonary:     Effort: Pulmonary effort is normal.  Musculoskeletal:     Right lower leg: 2+ Edema present.     Left lower leg: 2+ Edema present.  Skin:    General: Skin is warm and dry.  Neurological:     Mental Status: He is alert and oriented to person, place, and time.  Psychiatric:        Mood and Affect: Mood normal.        Behavior: Behavior normal.        Thought Content: Thought content normal.        Judgment: Judgment normal.     BP (!) 179/69   Pulse 76   Resp 18   Ht 5\' 7"  (1.702 m)   Wt (!) 335 lb (152 kg)   BMI 52.47 kg/m   Past Medical History:  Diagnosis Date   Arthritis    Cellulitis  and abscess of left leg    Depression    Diabetes mellitus without complication (HCC)    Hearing loss    History of IBS    HLD (hyperlipidemia)    Hypertension    IgA deficiency (HCC)    Morbid obesity (HCC)    Sleep apnea     Social History   Socioeconomic History   Marital status: Married    Spouse name: Not on file   Number of children: Not on file   Years of education: Not on file   Highest education level: Not on file  Occupational History   Not on file  Tobacco Use   Smoking status: Never   Smokeless tobacco: Never  Vaping Use   Vaping status: Never Used  Substance and Sexual Activity   Alcohol use: No   Drug use: No   Sexual activity: Not on file  Other Topics Concern   Not on file  Social History Narrative   Not on file   Social Drivers of Health   Financial Resource Strain: Low Risk  (12/29/2022)   Received from  [facility]   Overall Financial Resource Strain (CARDIA)    Difficulty of Paying Living Expenses: Not hard at all  Food Insecurity: No Food Insecurity (12/29/2022)   Received from [facility]   Hunger Vital Sign    Worried About Running Out of Food in the Last Year: Never true    Ran Out of Food in the Last Year: Never true  Transportation Needs: No Transportation Needs (12/29/2022)   Received from [facility] - Transportation    In the past 12 months, has lack of transportation kept you from medical appointments or from getting medications?: No    Lack of Transportation (Non-Medical): No  Physical Activity: Not on file  Stress: No Stress Concern Present (03/18/2021)   Received from [facility], [facility]   [facility] of Occupational Health - Occupational Stress Questionnaire    Feeling of Stress : Only a little  Social Connections: Not on file  Intimate Partner Violence: Not on file    Past Surgical History:  Procedure Laterality Date   COLONOSCOPY WITH  PROPOFOL N/A 07/13/2020   Procedure: COLONOSCOPY WITH PROPOFOL;  Surgeon: Regis Bill, MD;  Location: [facility] ENDOSCOPY;  Service: Endoscopy;  Laterality: N/A;   COLONOSCOPY WITH PROPOFOL N/A 12/23/2020   Procedure: COLONOSCOPY WITH PROPOFOL;  Surgeon: Midge Minium, MD;  Location[facility] ENDOSCOPY;  Service: Endoscopy;  Laterality: N/A;   ESOPHAGOGASTRODUODENOSCOPY (EGD) WITH PROPOFOL N/A 12/23/2020   Procedure: ESOPHAGOGASTRODUODENOSCOPY (EGD) WITH PROPOFOL;  Surgeon: Midge Minium, MD;  Location: [facility] ENDOSCOPY;  Service: Endoscopy;  Laterality: N/A;   GIVENS CAPSULE STUDY N/A 12/29/2020   Procedure: GIVENS CAPSULE STUDY;  Surgeon: Toney Reil, MD;  Location: [facility] ENDOSCOPY;  Service: Gastroenterology;  Laterality: N/A;   TOOTH EXTRACTION      Family History  Problem Relation Age of Onset   Benign prostatic hyperplasia Father    Psoriasis Father    Hypertension Mother    Hyperlipidemia Mother    Diabetes Maternal Grandmother    Diabetes Paternal Grandmother     Allergies  Allergen Reactions   Ampicillin Diarrhea   Bactrim [Sulfamethoxazole-Trimethoprim] Hives   Cephalexin    Claritin [Loratadine] Other (See Comments)    Irritates throat   Penicillins Rash       Latest Ref Rng & Units 01/30/2023    7:58 AM 11/30/2022    8:01 AM 09/27/2022    8:02 AM  CBC  WBC 4.0 - 10.5 K/uL 4.1  4.0  3.9   Hemoglobin 13.0 - 17.0 g/dL 56.2  13.0  86.5   Hematocrit 39.0 - 52.0 % 33.0  32.6  31.3   Platelets 150 - 400 K/uL 254  246  223       CMP     Component Value Date/Time   NA 134 (L) 11/30/2022 0801   K 4.2 11/30/2022 0801   CL 101 11/30/2022 0801   CO2 24 11/30/2022 0801   GLUCOSE 243 (H) 11/30/2022 0801   BUN 34 (H) 11/30/2022 0801   CREATININE 0.92 11/30/2022 0801   CALCIUM 9.5 11/30/2022 0801   PROT 7.3 11/30/2022 0801   ALBUMIN 4.0 11/30/2022 0801   AST 24 11/30/2022 0801   ALT 30 11/30/2022 0801   ALKPHOS 44 11/30/2022 0801   BILITOT 0.7 11/30/2022 0801    GFRNONAA >60 11/30/2022 0801     No results found.     Assessment & Plan:   1. Cellulitis of left lower extremity (  Primary) Today the patient has no evidence of cellulitis.  He has strongly palpable pulses I do not feel that there is an arterial component.  I suspect that this is largely driven by his lower extremity edema as well as a previous injury years ago.  In addition given his multiple episodes there may need to be considered some consideration that he may have a partial colonization.  He does use Hibiclens at times and this will actually be helpful with reducing the amount of cellulitis.  2. Primary hypertension Continue antihypertensive medications as already ordered, these medications have been reviewed and there are no changes at this time.  3. Leg swelling Recommend:  I have had a long discussion with the patient regarding swelling and why it  causes symptoms.  Patient will begin wearing graduated compression on a daily basis a prescription was given. The patient will  wear the stockings first thing in the morning and removing them in the evening. The patient is instructed specifically not to sleep in the stockings.   In addition, behavioral modification will be initiated.  This will include frequent elevation, use of over the counter pain medications and exercise such as walking.  Consideration for a lymph pump will also be made based upon the effectiveness of conservative therapy.  This would help to improve the edema control and prevent sequela such as ulcers and infections   Patient should undergo duplex ultrasound of the venous system to ensure that DVT or reflux is not present.  The patient will follow-up with me after the ultrasound.    Current Outpatient Medications on File Prior to Visit  Medication Sig Dispense Refill   acarbose (PRECOSE) 25 MG tablet Take 25 mg by mouth 3 (three) times daily with meals.     acetaminophen (TYLENOL) 500 MG tablet Take 500 mg by  mouth every 6 (six) hours as needed.     amLODipine (NORVASC) 10 MG tablet Take 10 mg by mouth daily.     Blood Pressure Monitor MISC Use 1 each once daily As directed     Continuous Glucose Sensor (FREESTYLE LIBRE 3 PLUS SENSOR) MISC by Does not apply route. Change sensor every 15 days.     doxycycline (MONODOX) 100 MG capsule Take 1 capsule twice daily with food and drink when flared for 2 weeks. Prn for flares. 28 capsule 11   fenofibrate 160 MG tablet Take 160 mg by mouth daily.     fexofenadine (ALLEGRA) 180 MG tablet Take 180 mg by mouth daily.     fluticasone (FLONASE) 50 MCG/ACT nasal spray Place 2 sprays into both nostrils daily as needed.     glimepiride (AMARYL) 4 MG tablet Take 4 mg by mouth daily with breakfast.     HIBICLENS 4 % external liquid APPLY TOPICALLY DAILY AS NEEDED. TO WASHLEGS AND GROIN AREA 120 mL 3   Hydrocortisone-Aloe 1 % CREA Apply 1 application  topically 2 (two) times daily as needed (itching). To itchy area on legs     insulin degludec (TRESIBA) 200 UNIT/ML FlexTouch Pen Inject 120 Units into the skin in the morning.     ketoconazole (NIZORAL) 2 % cream Apply to rash in groin once a day as needed for flares. 60 g 11   latanoprost (XALATAN) 0.005 % ophthalmic solution Place 1 drop into the right eye at bedtime.     lidocaine (LMX) 4 % cream Apply 1 application topically as needed.     lovastatin (MEVACOR) 10 MG tablet Take 10  mg by mouth daily.     metFORMIN (GLUCOPHAGE-XR) 500 MG 24 hr tablet Take 2 tablets by mouth 2 (two) times daily.     metoprolol tartrate (LOPRESSOR) 50 MG tablet Take 50 mg by mouth 2 (two) times daily.     Multiple Vitamins-Minerals (MULTIVITAMIN WITH MINERALS) tablet Take 1 tablet by mouth daily.     mupirocin ointment (BACTROBAN) 2 % Apply to boils and ulcers QD PRN flares. 22 g 3   omeprazole (PRILOSEC) 20 MG capsule Take 20 mg by mouth daily.     pioglitazone (ACTOS) 30 MG tablet Take 30 mg by mouth daily.     ramipril (ALTACE) 10 MG  capsule Take 10 mg by mouth 2 (two) times daily.     spironolactone (ALDACTONE) 50 MG tablet Take 50 mg by mouth daily.     terazosin (HYTRIN) 10 MG capsule Take 10 mg by mouth daily.      torsemide (DEMADEX) 20 MG tablet Take 20 mg by mouth daily.     traZODone (DESYREL) 150 MG tablet Take 150 mg by mouth at bedtime.     trolamine salicylate (ASPERCREME) 10 % cream Apply 1 application  topically as needed for muscle pain.     vitamin B-12 (CYANOCOBALAMIN) 1000 MCG tablet Take 1,000 mcg by mouth daily.     No current facility-administered medications on file prior to visit.    There are no Patient Instructions on file for this visit. No follow-ups on file.   Georgiana Spinner, NP

## 2023-03-22 ENCOUNTER — Other Ambulatory Visit (INDEPENDENT_AMBULATORY_CARE_PROVIDER_SITE_OTHER): Payer: Self-pay | Admitting: Nurse Practitioner

## 2023-03-22 DIAGNOSIS — M7989 Other specified soft tissue disorders: Secondary | ICD-10-CM

## 2023-03-26 ENCOUNTER — Encounter (INDEPENDENT_AMBULATORY_CARE_PROVIDER_SITE_OTHER): Payer: Self-pay | Admitting: Nurse Practitioner

## 2023-03-26 ENCOUNTER — Ambulatory Visit (INDEPENDENT_AMBULATORY_CARE_PROVIDER_SITE_OTHER): Payer: Medicare HMO

## 2023-03-26 ENCOUNTER — Ambulatory Visit (INDEPENDENT_AMBULATORY_CARE_PROVIDER_SITE_OTHER): Payer: Medicare HMO | Admitting: Nurse Practitioner

## 2023-03-26 VITALS — BP 145/68 | HR 71 | Resp 18 | Ht 67.0 in | Wt 335.0 lb

## 2023-03-26 DIAGNOSIS — M7989 Other specified soft tissue disorders: Secondary | ICD-10-CM

## 2023-03-26 DIAGNOSIS — I10 Essential (primary) hypertension: Secondary | ICD-10-CM

## 2023-03-26 DIAGNOSIS — I890 Lymphedema, not elsewhere classified: Secondary | ICD-10-CM | POA: Diagnosis not present

## 2023-03-27 NOTE — Progress Notes (Signed)
 Subjective:    Patient ID: Patrick Duncan, male    DOB: [birth year], 58 y.o.   MRN: [id number] Chief Complaint  Patient presents with   Follow-up    fu pt conv + Bilat Venous Reflux    The patient returns to the office for followup evaluation regarding leg swelling.  The swelling has persisted and the pain associated with swelling continues. There have not been any interval development of a ulcerations or wounds.  Since the previous visit the patient has been wearing graduated compression stockings and has noted little if any improvement in the edema. The patient has been using compression routinely morning until night.  The patient also states elevation during the day and exercise is being done too.    Today noninvasive study showed no evidence of DVT or superficial thrombophlebitis bilaterally.  No evidence of deep venous insufficiency or superficial venous reflux bilaterally.    Review of Systems  Cardiovascular:  Positive for leg swelling.  All other systems reviewed and are negative.      Objective:   Physical Exam Vitals reviewed.  HENT:     Head: Normocephalic.  Cardiovascular:     Rate and Rhythm: Normal rate.  Pulmonary:     Effort: Pulmonary effort is normal.  Musculoskeletal:     Right lower leg: Edema present.     Left lower leg: Edema present.  Skin:    General: Skin is warm.  Neurological:     Mental Status: He is alert and oriented to person, place, and time.  Psychiatric:        Mood and Affect: Mood normal.        Behavior: Behavior normal.        Thought Content: Thought content normal.        Judgment: Judgment normal.     BP (!) 145/68   Pulse 71   Resp 18   Ht 5\' 7"  (1.702 m)   Wt (!) 335 lb (152 kg)   BMI 52.47 kg/m   Past Medical History:  Diagnosis Date   Arthritis    Cellulitis and abscess of left leg    Depression    Diabetes mellitus without complication (HCC)    Hearing loss    History of IBS    HLD (hyperlipidemia)     Hypertension    IgA deficiency (HCC)    Morbid obesity (HCC)    Sleep apnea     Social History   Socioeconomic History   Marital status: Married    Spouse name: Not on file   Number of children: Not on file   Years of education: Not on file   Highest education level: Not on file  Occupational History   Not on file  Tobacco Use   Smoking status: Never   Smokeless tobacco: Never  Vaping Use   Vaping status: Never Used  Substance and Sexual Activity   Alcohol use: No   Drug use: No   Sexual activity: Not on file  Other Topics Concern   Not on file  Social History Narrative   Not on file   Social Drivers of Health   Financial Resource Strain: Low Risk  (12/29/2022)   Received from [facility]   Overall Financial Resource Strain (CARDIA)    Difficulty of Paying Living Expenses: Not hard at all  Food Insecurity: No Food Insecurity (12/29/2022)   Received from [facility]   Hunger Vital Sign    Worried About  Running Out of Food in the Last Year: Never true    Ran Out of Food in the Last Year: Never true  Transportation Needs: No Transportation Needs (12/29/2022)   Received from [facility] - Transportation    In the past 12 months, has lack of transportation kept you from medical appointments or from getting medications?: No    Lack of Transportation (Non-Medical): No  Physical Activity: Not on file  Stress: No Stress Concern Present (03/18/2021)   Received from [facility], [facility]   [facility] of Occupational Health - Occupational Stress Questionnaire    Feeling of Stress : Only a little  Social Connections: Not on file  Intimate Partner Violence: Not on file    Past Surgical History:  Procedure Laterality Date   COLONOSCOPY WITH PROPOFOL N/A 07/13/2020   Procedure: COLONOSCOPY WITH PROPOFOL;  Surgeon: Regis Bill, MD;  Location: [facility] ENDOSCOPY;  Service: Endoscopy;   Laterality: N/A;   COLONOSCOPY WITH PROPOFOL N/A 12/23/2020   Procedure: COLONOSCOPY WITH PROPOFOL;  Surgeon: Midge Minium, MD;  Location: [facility] ENDOSCOPY;  Service: Endoscopy;  Laterality: N/A;   ESOPHAGOGASTRODUODENOSCOPY (EGD) WITH PROPOFOL N/A 12/23/2020   Procedure: ESOPHAGOGASTRODUODENOSCOPY (EGD) WITH PROPOFOL;  Surgeon: Midge Minium, MD;  Location: [facility] ENDOSCOPY;  Service: Endoscopy;  Laterality: N/A;   GIVENS CAPSULE STUDY N/A 12/29/2020   Procedure: GIVENS CAPSULE STUDY;  Surgeon: Toney Reil, MD;  Location: [facility] ENDOSCOPY;  Service: Gastroenterology;  Laterality: N/A;   TOOTH EXTRACTION      Family History  Problem Relation Age of Onset   Benign prostatic hyperplasia Father    Psoriasis Father    Hypertension Mother    Hyperlipidemia Mother    Diabetes Maternal Grandmother    Diabetes Paternal Grandmother     Allergies  Allergen Reactions   Ampicillin Diarrhea   Bactrim [Sulfamethoxazole-Trimethoprim] Hives   Cephalexin    Claritin [Loratadine] Other (See Comments)    Irritates throat   Penicillins Rash       Latest Ref Rng & Units 01/30/2023    7:58 AM 11/30/2022    8:01 AM 09/27/2022    8:02 AM  CBC  WBC 4.0 - 10.5 K/uL 4.1  4.0  3.9   Hemoglobin 13.0 - 17.0 g/dL 16.1  09.6  04.5   Hematocrit 39.0 - 52.0 % 33.0  32.6  31.3   Platelets 150 - 400 K/uL 254  246  223       CMP     Component Value Date/Time   NA 134 (L) 11/30/2022 0801   K 4.2 11/30/2022 0801   CL 101 11/30/2022 0801   CO2 24 11/30/2022 0801   GLUCOSE 243 (H) 11/30/2022 0801   BUN 34 (H) 11/30/2022 0801   CREATININE 0.92 11/30/2022 0801   CALCIUM 9.5 11/30/2022 0801   PROT 7.3 11/30/2022 0801   ALBUMIN 4.0 11/30/2022 0801   AST 24 11/30/2022 0801   ALT 30 11/30/2022 0801   ALKPHOS 44 11/30/2022 0801   BILITOT 0.7 11/30/2022 0801   GFRNONAA >60 11/30/2022 0801     No results found.     Assessment & Plan:   1. Lymphedema (Primary) Recommend:  No surgery or  intervention at this point in time.   The Patient is CEAP [id number].  The patient has been wearing compression for more than 12 weeks with no or little benefit.  The patient has been exercising daily for more than 12 weeks. The patient has been  elevating and taking OTC pain medications for more than 12 weeks.  None of these have have eliminated the pain related to the lymphedema or the discomfort regarding excessive swelling and venous congestion.    I have reviewed my discussion with the patient regarding lymphedema and why it  causes symptoms.  Patient will continue wearing graduated compression on a daily basis. The patient should put the compression on first thing in the morning and removing them in the evening. The patient should not sleep in the compression.   In addition, behavioral modification throughout the day will be continued.  This will include frequent elevation (such as in a recliner), use of over the counter pain medications as needed and exercise such as walking.  The systemic causes for chronic edema such as liver, kidney and cardiac etiologies do not appear to have significant changed over the past year.    The patient has chronic , severe lymphedema with hyperpigmentation of the skin and has done MLD, skin care, medication, diet, exercise, elevation and compression for 4 weeks with no improvement,  I am recommending a lymphedema pump.  The patient still has stage 3 lymphedema and therefore, I believe that a lymph pump is needed to improve the control of the patient's lymphedema and improve the quality of life.  Additionally, a lymph pump is warranted because it will reduce the risk of cellulitis and ulceration in the future.  Patient should follow-up in six months   2. Primary hypertension Continue antihypertensive medications as already ordered, these medications have been reviewed and there are no changes at this time.   Current Outpatient Medications on File Prior to Visit   Medication Sig Dispense Refill   acarbose (PRECOSE) 25 MG tablet Take 25 mg by mouth 3 (three) times daily with meals.     acetaminophen (TYLENOL) 500 MG tablet Take 500 mg by mouth every 6 (six) hours as needed.     amLODipine (NORVASC) 10 MG tablet Take 10 mg by mouth daily.     Blood Pressure Monitor MISC Use 1 each once daily As directed     Continuous Glucose Sensor (FREESTYLE LIBRE 3 PLUS SENSOR) MISC by Does not apply route. Change sensor every 15 days.     doxycycline (MONODOX) 100 MG capsule Take 1 capsule twice daily with food and drink when flared for 2 weeks. Prn for flares. 28 capsule 11   fenofibrate 160 MG tablet Take 160 mg by mouth daily.     fexofenadine (ALLEGRA) 180 MG tablet Take 180 mg by mouth daily.     fluticasone (FLONASE) 50 MCG/ACT nasal spray Place 2 sprays into both nostrils daily as needed.     glimepiride (AMARYL) 4 MG tablet Take 4 mg by mouth daily with breakfast.     HIBICLENS 4 % external liquid APPLY TOPICALLY DAILY AS NEEDED. TO WASHLEGS AND GROIN AREA 120 mL 3   Hydrocortisone-Aloe 1 % CREA Apply 1 application  topically 2 (two) times daily as needed (itching). To itchy area on legs     insulin degludec (TRESIBA) 200 UNIT/ML FlexTouch Pen Inject 120 Units into the skin in the morning.     ketoconazole (NIZORAL) 2 % cream Apply to rash in groin once a day as needed for flares. 60 g 11   latanoprost (XALATAN) 0.005 % ophthalmic solution Place 1 drop into the right eye at bedtime.     lidocaine (LMX) 4 % cream Apply 1 application topically as needed.     lovastatin (MEVACOR)  10 MG tablet Take 10 mg by mouth daily.     metFORMIN (GLUCOPHAGE-XR) 500 MG 24 hr tablet Take 2 tablets by mouth 2 (two) times daily.     metoprolol tartrate (LOPRESSOR) 50 MG tablet Take 50 mg by mouth 2 (two) times daily.     Multiple Vitamins-Minerals (MULTIVITAMIN WITH MINERALS) tablet Take 1 tablet by mouth daily.     mupirocin ointment (BACTROBAN) 2 % Apply to boils and ulcers QD  PRN flares. 22 g 3   omeprazole (PRILOSEC) 20 MG capsule Take 20 mg by mouth daily.     pioglitazone (ACTOS) 30 MG tablet Take 30 mg by mouth daily.     ramipril (ALTACE) 10 MG capsule Take 10 mg by mouth 2 (two) times daily.     spironolactone (ALDACTONE) 50 MG tablet Take 50 mg by mouth daily.     terazosin (HYTRIN) 10 MG capsule Take 10 mg by mouth daily.      torsemide (DEMADEX) 20 MG tablet Take 20 mg by mouth daily.     traZODone (DESYREL) 150 MG tablet Take 150 mg by mouth at bedtime.     trolamine salicylate (ASPERCREME) 10 % cream Apply 1 application  topically as needed for muscle pain.     vitamin B-12 (CYANOCOBALAMIN) 1000 MCG tablet Take 1,000 mcg by mouth daily.     No current facility-administered medications on file prior to visit.    There are no Patient Instructions on file for this visit. No follow-ups on file.   Georgiana Spinner, NP

## 2023-03-30 ENCOUNTER — Inpatient Hospital Stay: Payer: Medicare HMO | Attending: Oncology

## 2023-03-30 ENCOUNTER — Ambulatory Visit: Payer: Medicare HMO

## 2023-03-30 ENCOUNTER — Encounter: Payer: Self-pay | Admitting: Oncology

## 2023-03-30 ENCOUNTER — Ambulatory Visit: Payer: Medicare HMO | Admitting: Oncology

## 2023-03-30 ENCOUNTER — Inpatient Hospital Stay (HOSPITAL_BASED_OUTPATIENT_CLINIC_OR_DEPARTMENT_OTHER): Payer: Medicare HMO | Admitting: Oncology

## 2023-03-30 ENCOUNTER — Other Ambulatory Visit: Payer: Medicare HMO

## 2023-03-30 ENCOUNTER — Inpatient Hospital Stay: Payer: Medicare HMO

## 2023-03-30 VITALS — BP 130/52 | HR 70 | Temp 97.4°F | Resp 19 | Ht 67.0 in | Wt 324.6 lb

## 2023-03-30 DIAGNOSIS — E1165 Type 2 diabetes mellitus with hyperglycemia: Secondary | ICD-10-CM | POA: Insufficient documentation

## 2023-03-30 DIAGNOSIS — Z79899 Other long term (current) drug therapy: Secondary | ICD-10-CM | POA: Insufficient documentation

## 2023-03-30 DIAGNOSIS — Z794 Long term (current) use of insulin: Secondary | ICD-10-CM | POA: Diagnosis not present

## 2023-03-30 DIAGNOSIS — R161 Splenomegaly, not elsewhere classified: Secondary | ICD-10-CM | POA: Insufficient documentation

## 2023-03-30 DIAGNOSIS — D649 Anemia, unspecified: Secondary | ICD-10-CM

## 2023-03-30 DIAGNOSIS — D802 Selective deficiency of immunoglobulin A [IgA]: Secondary | ICD-10-CM | POA: Diagnosis not present

## 2023-03-30 DIAGNOSIS — D759 Disease of blood and blood-forming organs, unspecified: Secondary | ICD-10-CM | POA: Insufficient documentation

## 2023-03-30 DIAGNOSIS — Z7984 Long term (current) use of oral hypoglycemic drugs: Secondary | ICD-10-CM | POA: Diagnosis not present

## 2023-03-30 LAB — CMP (CANCER CENTER ONLY)
ALT: 32 U/L (ref 0–44)
AST: 27 U/L (ref 15–41)
Albumin: 4.2 g/dL (ref 3.5–5.0)
Alkaline Phosphatase: 41 U/L (ref 38–126)
Anion gap: 9 (ref 5–15)
BUN: 23 mg/dL — ABNORMAL HIGH (ref 6–20)
CO2: 22 mmol/L (ref 22–32)
Calcium: 8.9 mg/dL (ref 8.9–10.3)
Chloride: 100 mmol/L (ref 98–111)
Creatinine: 0.78 mg/dL (ref 0.61–1.24)
GFR, Estimated: >60 mL/min (ref >60)
Glucose, Bld: 225 mg/dL — ABNORMAL HIGH (ref 70–99)
Potassium: 3.9 mmol/L (ref 3.5–5.1)
Sodium: 131 mmol/L — ABNORMAL LOW (ref 135–145)
Total Bilirubin: 0.8 mg/dL (ref 0.0–1.2)
Total Protein: 7.4 g/dL (ref 6.5–8.1)

## 2023-03-30 LAB — CBC WITH DIFFERENTIAL/PLATELET
Abs Immature Granulocytes: 0.07 10*3/uL (ref 0.00–0.07)
Basophils Absolute: 0 10*3/uL (ref 0.0–0.1)
Basophils Relative: 0 %
Eosinophils Absolute: 0 10*3/uL (ref 0.0–0.5)
Eosinophils Relative: 0 %
HCT: 34.1 % — ABNORMAL LOW (ref 39.0–52.0)
Hemoglobin: 11.5 g/dL — ABNORMAL LOW (ref 13.0–17.0)
Immature Granulocytes: 2 %
Lymphocytes Relative: 5 %
Lymphs Abs: 0.2 10*3/uL — ABNORMAL LOW (ref 0.7–4.0)
MCH: 32.6 pg (ref 26.0–34.0)
MCHC: 33.7 g/dL (ref 30.0–36.0)
MCV: 96.6 fL (ref 80.0–100.0)
Monocytes Absolute: 0.4 10*3/uL (ref 0.1–1.0)
Monocytes Relative: 12 %
Neutro Abs: 2.5 10*3/uL (ref 1.7–7.7)
Neutrophils Relative %: 81 %
Platelets: 287 10*3/uL (ref 150–400)
RBC: 3.53 MIL/uL — ABNORMAL LOW (ref 4.22–5.81)
RDW: 13.5 % (ref 11.5–15.5)
WBC: 3.1 10*3/uL — ABNORMAL LOW (ref 4.0–10.5)
nRBC: 0 % (ref 0.0–0.2)

## 2023-03-30 NOTE — Progress Notes (Signed)
 Patient has been dealing with a headache.

## 2023-03-30 NOTE — Progress Notes (Signed)
 [facility]  Telephone:([phone]) [phone] Fax:(336) [phone]  ID: Patrick Duncan OB: 1965-09-23  MR#: [id number]  ZSW#:[id number]  Patient Care Team: Marina Goodell, MD as PCP - General (Family Medicine) Jeralyn Ruths, MD as Consulting Physician (Oncology)  CHIEF COMPLAINT: Idiopathic cytopenia of undetermined significance.    INTERVAL HISTORY: Patient returns to clinic today for repeat laboratory can further evaluation.  He currently feels well and is asymptomatic.  He does not complain of any weakness or fatigue. He has no neurologic complaints.  He denies any recent fevers.  He has a fair appetite, but denies weight loss.  He denies any chest pain, shortness of breath, cough, or hemoptysis.  He denies any nausea, vomiting, constipation, or diarrhea.  He has no melena or hematochezia.  He has no urinary complaints.  [name]  REVIEW OF SYSTEMS:   Review of Systems  Constitutional: Negative.  Negative for fever, malaise/fatigue and weight loss.  Respiratory: Negative.  Negative for cough, hemoptysis and shortness of breath.   Cardiovascular: Negative.  Negative for chest pain and leg swelling.  Gastrointestinal: Negative.  Negative for abdominal pain, blood in stool and melena.  Genitourinary: Negative.  Negative for hematuria.  Musculoskeletal: Negative.  Negative for back pain.  Skin: Negative.  Negative for rash.  Neurological: Negative.  Negative for dizziness, focal weakness, weakness and headaches.  Psychiatric/Behavioral: Negative.  The patient is not nervous/anxious.     As per HPI. Otherwise, a complete review of systems is negative.  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HCC)     Morbid obesity (HCC)    Sleep apnea     PAST SURGICAL HISTORY: Past Surgical History:  Procedure Laterality Date   COLONOSCOPY WITH PROPOFOL N/A 07/13/2020   Procedure: COLONOSCOPY WITH PROPOFOL;  Surgeon: Regis Bill, MD;  Location: [facility] ENDOSCOPY;  Service: Endoscopy;  Laterality: N/A;   COLONOSCOPY WITH PROPOFOL N/A 12/23/2020   Procedure: COLONOSCOPY WITH PROPOFOL;  Surgeon: Midge Minium, MD;  Location: [facility] ENDOSCOPY;  Service: Endoscopy;  Laterality: N/A;   ESOPHAGOGASTRODUODENOSCOPY (EGD) WITH PROPOFOL N/A 12/23/2020   Procedure: ESOPHAGOGASTRODUODENOSCOPY (EGD) WITH PROPOFOL;  Surgeon: Midge Minium, MD;  Location: [facility] ENDOSCOPY;  Service: Endoscopy;  Laterality: N/A;   GIVENS CAPSULE STUDY N/A 12/29/2020   Procedure: GIVENS CAPSULE STUDY;  Surgeon: Toney Reil, MD;  Location: [facility] ENDOSCOPY;  Service: Gastroenterology;  Laterality: N/A;   TOOTH EXTRACTION      FAMILY HISTORY: Family History  Problem Relation Age of Onset   Benign prostatic hyperplasia Father    Psoriasis Father    Hypertension Mother    Hyperlipidemia Mother    Diabetes Maternal Grandmother    Diabetes Paternal Grandmother     ADVANCED DIRECTIVES (Y/N):  N  HEALTH MAINTENANCE: Social History   Tobacco Use   Smoking status: Never   Smokeless tobacco: Never  Vaping Use   Vaping status: Never Used  Substance Use Topics   Alcohol use: No   Drug use: No     Colonoscopy:  PAP:  Bone density:  Lipid panel:  Allergies  Allergen Reactions   Ampicillin Diarrhea   Bactrim [Sulfamethoxazole-Trimethoprim] Hives   Cephalexin  Claritin [Loratadine] Other (See Comments)    Irritates throat   Penicillins Rash    Current Outpatient Medications  Medication Sig Dispense Refill   acarbose (PRECOSE) 25 MG tablet Take 25 mg by mouth 3 (three) times daily with meals.     acetaminophen (TYLENOL) 500 MG tablet Take 500 mg by mouth every 6 (six) hours as needed.     amLODipine (NORVASC) 10  MG tablet Take 10 mg by mouth daily.     Blood Pressure Monitor MISC Use 1 each once daily As directed     Continuous Glucose Sensor (FREESTYLE LIBRE 3 PLUS SENSOR) MISC by Does not apply route. Change sensor every 15 days.     doxycycline (MONODOX) 100 MG capsule Take 1 capsule twice daily with food and drink when flared for 2 weeks. Prn for flares. 28 capsule 11   fenofibrate 160 MG tablet Take 160 mg by mouth daily.     fexofenadine (ALLEGRA) 180 MG tablet Take 180 mg by mouth daily.     fluticasone (FLONASE) 50 MCG/ACT nasal spray Place 2 sprays into both nostrils daily as needed.     glimepiride (AMARYL) 4 MG tablet Take 4 mg by mouth daily with breakfast.     HIBICLENS 4 % external liquid APPLY TOPICALLY DAILY AS NEEDED. TO WASHLEGS AND GROIN AREA 120 mL 3   Hydrocortisone-Aloe 1 % CREA Apply 1 application  topically 2 (two) times daily as needed (itching). To itchy area on legs     insulin degludec (TRESIBA) 200 UNIT/ML FlexTouch Pen Inject 120 Units into the skin in the morning.     ketoconazole (NIZORAL) 2 % cream Apply to rash in groin once a day as needed for flares. 60 g 11   latanoprost (XALATAN) 0.005 % ophthalmic solution Place 1 drop into the right eye at bedtime.     lidocaine (LMX) 4 % cream Apply 1 application topically as needed.     lovastatin (MEVACOR) 10 MG tablet Take 10 mg by mouth daily.     metFORMIN (GLUCOPHAGE-XR) 500 MG 24 hr tablet Take 2 tablets by mouth 2 (two) times daily.     metoprolol tartrate (LOPRESSOR) 50 MG tablet Take 50 mg by mouth 2 (two) times daily.     Multiple Vitamins-Minerals (MULTIVITAMIN WITH MINERALS) tablet Take 1 tablet by mouth daily.     mupirocin ointment (BACTROBAN) 2 % Apply to boils and ulcers QD PRN flares. 22 g 3   omeprazole (PRILOSEC) 20 MG capsule Take 20 mg by mouth daily.     pioglitazone (ACTOS) 30 MG tablet Take 30 mg by mouth daily.     ramipril (ALTACE) 10 MG capsule Take 10 mg by mouth 2 (two) times daily.      spironolactone (ALDACTONE) 50 MG tablet Take 50 mg by mouth daily.     terazosin (HYTRIN) 10 MG capsule Take 10 mg by mouth daily.      torsemide (DEMADEX) 20 MG tablet Take 20 mg by mouth daily.     traZODone (DESYREL) 150 MG tablet Take 150 mg by mouth at bedtime.     trolamine salicylate (ASPERCREME) 10 % cream Apply 1 application  topically as needed for muscle pain.     vitamin B-12 (CYANOCOBALAMIN) 1000 MCG tablet Take 1,000 mcg by mouth daily.     No current facility-administered medications for this visit.    OBJECTIVE: Vitals:   03/30/23 0800  BP: (!) 130/52  Pulse: 70  Resp: 19  Temp: (!) 97.4 F (  36.3 C)  SpO2: 99%      Body mass index is 50.84 kg/m.    ECOG FS:0 - Asymptomatic  General: Well-developed, well-nourished, no acute distress. Eyes: Pink conjunctiva, anicteric sclera. HEENT: Normocephalic, moist mucous membranes. Lungs: No audible wheezing or coughing. Heart: Regular rate and rhythm. Abdomen: Soft, nontender, no obvious distention. Musculoskeletal: No edema, cyanosis, or clubbing. Neuro: Alert, answering all questions appropriately. Cranial nerves grossly intact. Skin: No rashes or petechiae noted. Psych: Normal affect.  LAB RESULTS:  Lab Results  Component Value Date   NA 131 (L) 03/30/2023   K 3.9 03/30/2023   CL 100 03/30/2023   CO2 22 03/30/2023   GLUCOSE 225 (H) 03/30/2023   BUN 23 (H) 03/30/2023   CREATININE 0.78 03/30/2023   CALCIUM 8.9 03/30/2023   PROT 7.4 03/30/2023   ALBUMIN 4.2 03/30/2023   AST 27 03/30/2023   ALT 32 03/30/2023   ALKPHOS 41 03/30/2023   BILITOT 0.8 03/30/2023   GFRNONAA >60 03/30/2023   GFRAA >60 10/25/2016    Lab Results  Component Value Date   WBC 3.1 (L) 03/30/2023   NEUTROABS 2.5 03/30/2023   HGB 11.5 (L) 03/30/2023   HCT 34.1 (L) 03/30/2023   MCV 96.6 03/30/2023   PLT 287 03/30/2023     STUDIES: VAS [place] LOWER EXTREMITY VENOUS REFLUX Result Date: 03/26/2023  Lower Venous Reflux Study Patient  Name:  DEVAUGHN SAVANT  Date of Exam:   03/26/2023 Medical Rec #: [id number]          Accession #:    [id number] Date of Birth: 07-Oct-1965         Patient Gender: M Patient Age:   70 years Exam Location:  [facility] Procedure:      VAS [place] LOWER EXTREMITY VENOUS REFLUX Referring Phys: Sheppard Plumber --------------------------------------------------------------------------------  Indications: Swelling, Edema, and Pain.  Limitations: Body habitus and patient discomfort. Performing Technologist: Hardie Lora RVT  Examination Guidelines: A complete evaluation includes B-mode imaging, spectral Doppler, color Doppler, and power Doppler as needed of all accessible portions of each vessel. Bilateral testing is considered an integral part of a complete examination. Limited examinations for reoccurring indications may be performed as noted. The reflux portion of the exam is performed with the patient in reverse Trendelenburg. Significant venous reflux is defined as >500 ms in the superficial venous system, and >1 second in the deep venous system.  Venous Reflux Times +--------------+---------+------+-----------+------------+--------+ RIGHT         Reflux NoRefluxReflux TimeDiameter cmsComments                         Yes                                  +--------------+---------+------+-----------+------------+--------+ CFV           no                                             +--------------+---------+------+-----------+------------+--------+ FV prox       no                                             +--------------+---------+------+-----------+------------+--------+  FV mid        no                                             +--------------+---------+------+-----------+------------+--------+ FV dist       no                                             +--------------+---------+------+-----------+------------+--------+ Popliteal     no                                              +--------------+---------+------+-----------+------------+--------+ GSV at [facility]    no                            0.82             +--------------+---------+------+-----------+------------+--------+ GSV prox thighno                            0.62             +--------------+---------+------+-----------+------------+--------+ GSV mid thigh no                            0.70             +--------------+---------+------+-----------+------------+--------+ GSV dist thighno                            0.52             +--------------+---------+------+-----------+------------+--------+ GSV at knee   no                            0.56             +--------------+---------+------+-----------+------------+--------+ GSV prox calf no                            0.52             +--------------+---------+------+-----------+------------+--------+ SSV Pop Fossa no                            0.86             +--------------+---------+------+-----------+------------+--------+ SSV prox calf no                            0.54             +--------------+---------+------+-----------+------------+--------+ SSV mid calf  no                            0.36             +--------------+---------+------+-----------+------------+--------+  +--------------+---------+------+-----------+------------+--------+ LEFT          Reflux NoRefluxReflux TimeDiameter cmsComments  Yes                                  +--------------+---------+------+-----------+------------+--------+ CFV           no                                             +--------------+---------+------+-----------+------------+--------+ FV prox       no                                             +--------------+---------+------+-----------+------------+--------+ FV mid        no                                              +--------------+---------+------+-----------+------------+--------+ FV dist       no                                             +--------------+---------+------+-----------+------------+--------+ Popliteal     no                                             +--------------+---------+------+-----------+------------+--------+ GSV at [facility]    no                            0.98             +--------------+---------+------+-----------+------------+--------+ GSV prox thighno                            0.85             +--------------+---------+------+-----------+------------+--------+ GSV mid thigh no                            0.79             +--------------+---------+------+-----------+------------+--------+ GSV dist thighno                            0.72             +--------------+---------+------+-----------+------------+--------+ GSV at knee   no                            0.61             +--------------+---------+------+-----------+------------+--------+ GSV prox calf no                            0.73             +--------------+---------+------+-----------+------------+--------+ SSV Pop Fossa no  0.67             +--------------+---------+------+-----------+------------+--------+ SSV prox calf no                            0.59             +--------------+---------+------+-----------+------------+--------+ SSV mid calf  no                            0.25             +--------------+---------+------+-----------+------------+--------+   Summary: Right: - No evidence of deep vein thrombosis seen in the right lower extremity, from the common femoral through the popliteal veins. - No evidence of superficial venous thrombosis in the right lower extremity. - No evidence of superficial venous reflux seen in the right greater saphenous vein. - No evidence of superficial venous reflux seen in the right short saphenous  vein.  Left: - No evidence of deep vein thrombosis seen in the left lower extremity, from the common femoral through the popliteal veins. - No evidence of superficial venous thrombosis in the left lower extremity. - No evidence of superficial venous reflux seen in the left greater saphenous vein. - No evidence of superficial venous reflux seen in the left short saphenous vein.  *See table(s) above for measurements and observations. Electronically signed by Levora Dredge MD on 03/26/2023 at 4:51:42 PM.    Final     ASSESSMENT:Idiopathic cytopenia of undetermined significance.  PLAN:    Idiopathic cytopenia of undetermined significance: Bone marrow biopsy on January 03, 2021 revealed a hypercellular marrow with erythroid hyperplasia and dyserythropoiesis.  Patient initially thought to have MDS, but second opinion from [facility] did not feel his bone marrow results supported this diagnosis.  No blasts, increased plasma cells, or clonality was noted.  Cytogenetics were reported as normal.  PET scan results from January 10, 2022 reviewed independently with no obvious evidence of underlying hypermetabolism suggestive of malignancy.  Patient has not required a blood transfusion since June 17, 2021.  His last Aranesp was given on January 31, 2022.  His fourth infusion of weekly Rituxan was given on February 28, 2022.  Patient's hemoglobin remained stable at 11.5 today.  Is now been over a year since patient required intervention and he can be discharged from clinic.  Recommend primary care physician continue to monitor CBC every 4 to 6 months and if his hemoglobin falls below 10.0 please refer him back for further evaluation.   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  Recent and repeat IgA levels were undetectable with a normal IgM and IgG.  Patient also has anti-IgA antibodies.  Patients with isolated IgA deficiency can have anaphylactoid  reactions to blood transfusions, therefor he needs matched and washed packed red blood cells.   Shortness of breath: Resolved.  Follow-up with pulmonary as indicated. Positive ANA: Likely clinically insignificant.   Splenomegaly: Mild.  Possibly related to underlying NASH.   NASH: Continue follow-up at [facility] clinic as scheduled. Hyperglycemia: Patient continues to have poor blood glucose control.  Continue follow-up and treatment per primary care.  I spent a total of 20 minutes reviewing chart data, face-to-face evaluation with the patient, counseling and coordination of care as detailed above.   Patient expressed understanding and was in agreement with this plan. He also understands that He can  call clinic at any time with any questions, concerns, or complaints.    Jeralyn Ruths, MD   03/30/2023 9:00 AM

## 2023-05-25 ENCOUNTER — Encounter: Payer: Self-pay | Admitting: "Endocrinology

## 2023-06-05 ENCOUNTER — Encounter (INDEPENDENT_AMBULATORY_CARE_PROVIDER_SITE_OTHER): Payer: Self-pay

## 2023-06-14 ENCOUNTER — Encounter: Payer: Self-pay | Admitting: Dietician

## 2023-06-14 ENCOUNTER — Encounter: Attending: Family Medicine | Admitting: Dietician

## 2023-06-14 VITALS — Ht 67.0 in | Wt 323.0 lb

## 2023-06-14 DIAGNOSIS — E1165 Type 2 diabetes mellitus with hyperglycemia: Secondary | ICD-10-CM | POA: Diagnosis present

## 2023-06-14 DIAGNOSIS — Z6841 Body Mass Index (BMI) 40.0 and over, adult: Secondary | ICD-10-CM | POA: Diagnosis not present

## 2023-06-14 DIAGNOSIS — Z713 Dietary counseling and surveillance: Secondary | ICD-10-CM | POA: Insufficient documentation

## 2023-06-14 DIAGNOSIS — E119 Type 2 diabetes mellitus without complications: Secondary | ICD-10-CM

## 2023-06-14 NOTE — Patient Instructions (Signed)
 Continue with healthy food choices, great job so far! Gradually add more steps in each day, short periods of walking to avoid pain.  Keep a journal of what and how much you eat, how you feel including pain, stress, or illness, and what blood sugar readings are. This can help figure out how to get more numbers in the target range.

## 2023-06-14 NOTE — Progress Notes (Signed)
 Diabetes Self-Management Education  Visit Type: First/Initial  Appt. Start Time: 0830 Appt. End Time: 1010  06/14/2023  Mr. Patrick Duncan, identified by name and date of birth, is a 58 y.o. male with a diagnosis of Diabetes: Type 2.   ASSESSMENT  Height 5\' 7"  (1.702 m), weight (!) 323 lb (146.5 kg). Body mass index is 50.59 kg/m.   Diabetes Self-Management Education - 06/14/23 0849       Visit Information   Visit Type First/Initial      Initial Visit   Diabetes Type Type 2    Are you currently following a meal plan? Yes    What type of meal plan do you follow? mediterranean eating pattern    Are you taking your medications as prescribed? Yes      Psychosocial Assessment   Patient Belief/Attitude about Diabetes Motivated to manage diabetes    Self-care barriers None    Special Needs None    Learning Readiness Change in progress    How often do you need to have someone help you when you read instructions, pamphlets, or other written materials from your doctor or pharmacy? 1 - Never    What is the last grade level you completed in school? 12      Pre-Education Assessment   Patient understands the diabetes disease and treatment process. Comprehends key points    Patient understands incorporating nutritional management into lifestyle. Comprehends key points    Patient undertands incorporating physical activity into lifestyle. Needs Review    Patient understands using medications safely. Comprehends key points    Patient understands monitoring blood glucose, interpreting and using results Needs Review    Patient understands prevention, detection, and treatment of acute complications. Needs Review    Patient understands prevention, detection, and treatment of chronic complications. Needs Review    Patient understands how to develop strategies to address psychosocial issues. Needs Review    Patient understands how to develop strategies to promote health/change behavior.  Comprehends key points      Complications   Last HgB A1C per patient/outside source --   result of 5% on 05/24/23, inacurrate due to low hemoglobin   How often do you check your blood sugar? > 4 times/day    Fasting Blood glucose range (mg/dL) 829-562;>130;865-784    Postprandial Blood glucose range (mg/dL) 696-295;284-132;>440    Number of hypoglycemic episodes per month 0    Have you had a dilated eye exam in the past 12 months? Yes    Have you had a dental exam in the past 12 months? Yes    Are you checking your feet? Yes    How many days per week are you checking your feet? 2      Dietary Intake   Breakfast egg, mushrooms, white american cheese, [place] sausage or vegetarian sausage    Snack (morning) none    Lunch chicken salad with mozzarella on ww bread; lean cuisine pizza cheese or veggie; veggie lasagna and salad; veggie burger or hot dog; mediterranean soup; occasionally tuna bowl or salad; sometimes harvest snaps/ sun chips    Snack (afternoon) whole grain crackers with peanut butter; natural peanut butter and fruit spread on whole grain bread    Dinner same as lunch; [place] tenderloin/chicken + frozen veg + brown rice or quinoa; has used some flax seeds    Snack (evening) none    Beverage(s) water      Activity / Exercise   Activity / Exercise Type ADL's  activity duration limited due to back and leg pain when standing     Patient Education   Previous Diabetes Education Yes (please comment)   1993   Healthy Eating Reviewed blood glucose goals for pre and post meals and how to evaluate the patients' food intake on their blood glucose level.;Meal options for control of blood glucose level and chronic complications.;Other (comment)   Mediterranean eating pattern, recipes, meal options   Being Active Role of exercise on diabetes management, blood pressure control and cardiac health.;Helped patient identify appropriate exercises in relation to his/her diabetes, diabetes complications  and other health issue.   gradual increase in daily steps/ activity   Monitoring Taught/evaluated CGM (comment);Identified appropriate SMBG and/or A1C goals.;Taught/discussed recording of test results and interpretation of SMBG.;Purpose and frequency of SMBG.   use of Libre reader, trend arrows, 7, 14-day averages, time in range   Diabetes Stress and Support Role of stress on diabetes      Individualized Goals (developed by patient)   Nutrition Follow meal plan discussed    Physical Activity 15 minutes per day   gradual increase in activity as tolerated   Monitoring  Other (comment)   keep food, activity, health and stress journal to help evaluate effects on glucose     Outcomes   Expected Outcomes Demonstrated interest in learning. Expect positive outcomes    Future DMSE 4-6 wks             Individualized Plan for Diabetes Self-Management Training:   Learning Objective:  Patient will have a greater understanding of diabetes self-management. Patient education plan is to attend individual and/or group sessions per assessed needs and concerns.   Plan:   Patient Instructions  Continue with healthy food choices, great job so far! Gradually add more steps in each day, short periods of walking to avoid pain.  Keep a journal of what and how much you eat, how you feel including pain, stress, or illness, and what blood sugar readings are. This can help figure out how to get more numbers in the target range.  Expected Outcomes:  Demonstrated interest in learning. Expect positive outcomes  Education material provided: Get Healthy with Mediterranean-Style Eating; Freestyle Libre3 insertion and adhesion handout; CGM monitoring overview handout (ADA)  If problems or questions, patient to contact team via:  Phone  Future DSME appointment: 4-6 wks

## 2023-06-19 IMAGING — CT CT BIOPSY AND ASPIRATION BONE MARROW
2 of 4 series · 7 of 14 positions shown, 8 images · non-contrast
Comparison: none

INDICATION: Anemia

[Series 2: i-spiral 5.0 br38 · axial · 0.98mm/px · z∈[+800,+874]mm · 4 of 35 slices shown, 5 images (1 of 2)]
[im 7/35  soft-tissue]
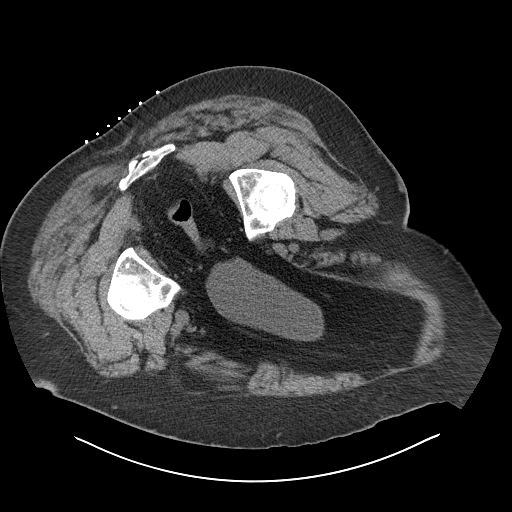
[im 7/35  bone]
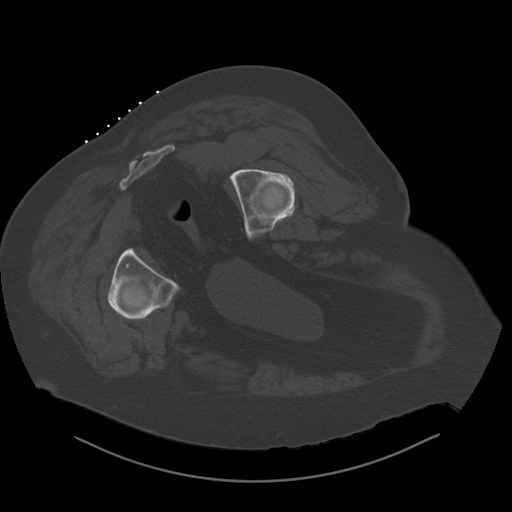
[im 14/35  bone]
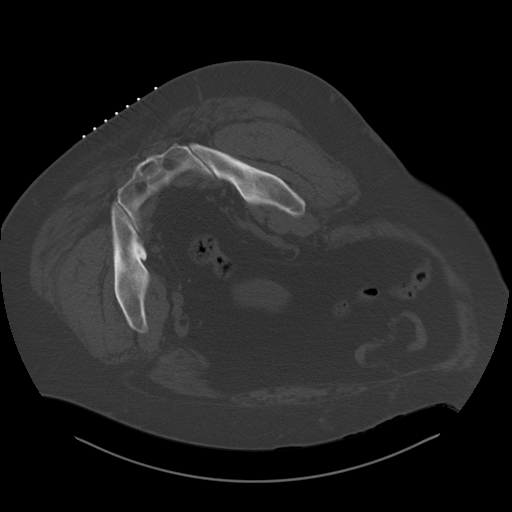
[im 21/35  bone]
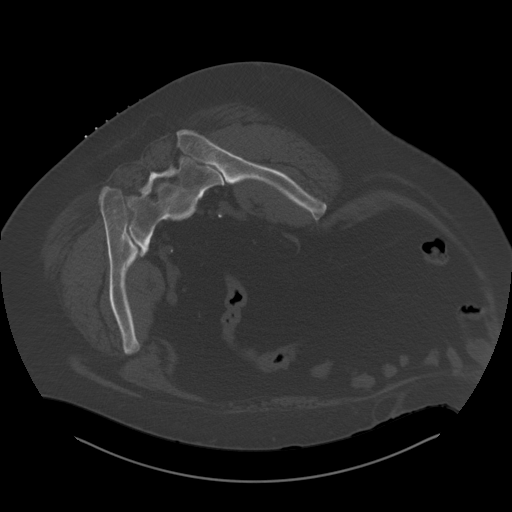
[im 28/35  bone]
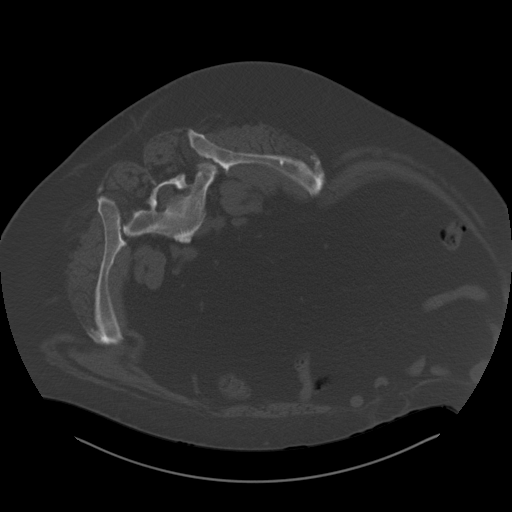

[Series 3: i-spiral 5.0 br38 · axial · 0.98mm/px · z∈[+832,+874]mm · 3 of 26 slices shown (2 of 2)]
[im 7/26  bone]
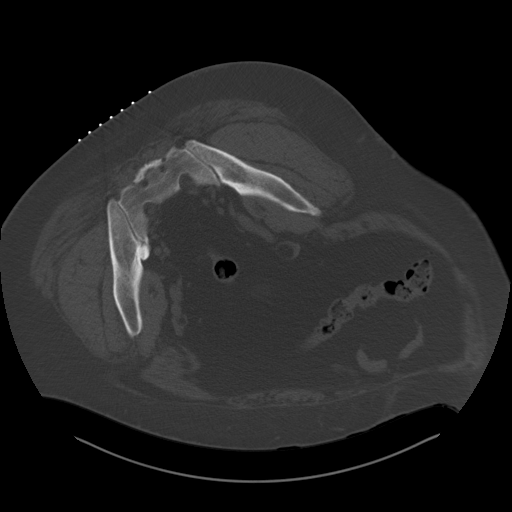
[im 13/26  bone]
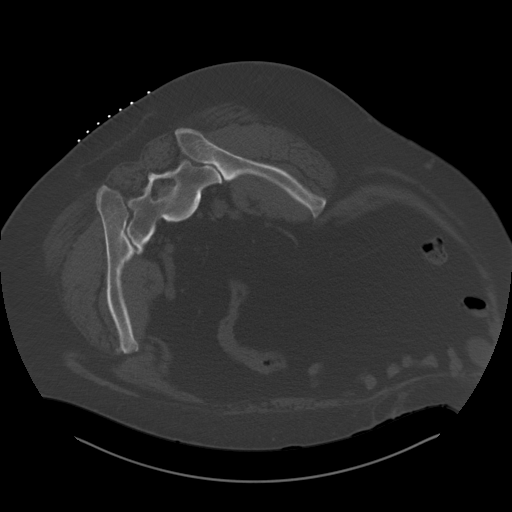
[im 19/26  bone]
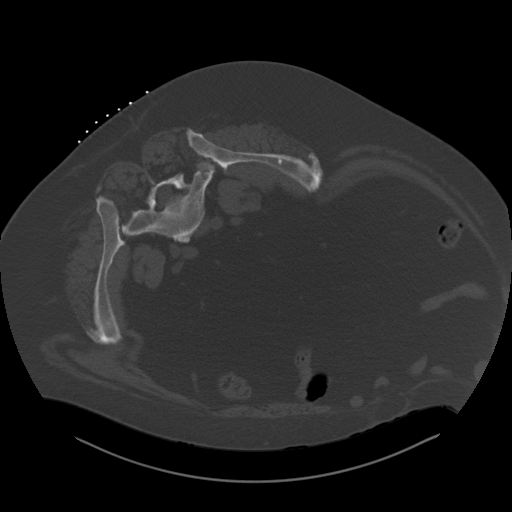

[7 of 14 positions shown; findings below may reference images not displayed]

EXAM:
CT BONE MARROW BIOPSY AND ASPIRATION

MEDICATIONS:
None.

ANESTHESIA/SEDATION:
Moderate (conscious) sedation was employed during this procedure. A
total of Versed 0.5 mg and Fentanyl 50 mcg was administered
intravenously.

Moderate Sedation Time: 13 minutes. The patient's level of
consciousness and vital signs were monitored continuously by
radiology nursing throughout the procedure under my direct
supervision.

FLUOROSCOPY TIME:  N/a

COMPLICATIONS:
None immediate.

PROCEDURE:
Informed written consent was obtained from the patient after a
thorough discussion of the procedural risks, benefits and
alternatives. All questions were addressed. Maximal Sterile Barrier
Technique was utilized including caps, mask, sterile gowns, sterile
gloves, sterile drape, hand hygiene and skin antiseptic. A timeout
was performed prior to the initiation of the procedure.

The patient was placed prone on the CT exam table. Limited CT of the
pelvis was performed for planning purposes. Skin entry site was
marked, and the overlying skin was prepped and draped in the
standard sterile fashion. Local analgesia was obtained with 1%
lidocaine. Using CT guidance, an 11 gauge needle was advanced just
deep to the cortex of the right posterior ilium. Subsequently, bone
marrow aspiration and core biopsy were performed. Specimens were
submitted to lab/pathology for handling. Hemostasis was achieved
with manual pressure, and a clean dressing was placed. The patient
tolerated the procedure well without immediate complication.
IMPRESSION: Successful CT-guided bone marrow aspiration and core biopsy of the
right posterior ilium.

## 2023-07-26 ENCOUNTER — Ambulatory Visit: Admitting: Dietician

## 2023-08-30 ENCOUNTER — Encounter: Attending: Family Medicine | Admitting: Dietician

## 2023-08-30 DIAGNOSIS — E119 Type 2 diabetes mellitus without complications: Secondary | ICD-10-CM

## 2023-08-30 DIAGNOSIS — Z713 Dietary counseling and surveillance: Secondary | ICD-10-CM | POA: Diagnosis not present

## 2023-08-30 DIAGNOSIS — E1165 Type 2 diabetes mellitus with hyperglycemia: Secondary | ICD-10-CM | POA: Diagnosis present

## 2023-08-30 NOTE — Patient Instructions (Signed)
 Keep making healthy food choices, great job! Keep carbs (total Carbohydrates on food label) close to 45 grams with each meal. Keep added sugars to 9 grams or less for each food.  When taking insulin  with the pen, push the button to inject, then hold the pen on skin for 5-10 seconds before pulling it away.

## 2023-08-30 NOTE — Progress Notes (Signed)
 Diabetes Self-Management Education  Visit Type:  Follow-up  Appt. Start Time: 0830 Appt. End Time: 0930  08/30/2023  Mr. Patrick Duncan, identified by name and date of birth, is a 58 y.o. male with a diagnosis of Diabetes: type 2     ASSESSMENT  There were no vitals taken for this visit.  There is no height or weight on file to calculate BMI. Patient declined weight check, but states he continues to lose weight gradually    Diabetes Self-Management Education - 08/30/23 0922       Complications   How often do you check your blood sugar? > 4 times/day    Fasting Blood glucose range (mg/dL) >799   average for past 30 days 6am - 12pm time frame; tir 35%, above 65%   Number of hypoglycemic episodes per month 0      Dietary Intake   Breakfast egg, mushrooms, cheese, occ plant-based sausage    Lunch chicken salad on 50-cal 100% whole wheat bread/ Lean Cuisine frozen meal    Snack (afternoon) natural peanut butter and fruit spread on whole wheat low-cal bread    Dinner [place] tenderloin baked + brown rice + cooked veg from frozen or salad; whole wheat pasta with ground [place] and sauce; tacos with ground [place]; does eat lima beans, pintos, black eyed peas, occ lentils    Beverage(s) water, occasionally sugar free lemonade      Activity / Exercise   Activity / Exercise Type ADL's      Patient Education   Healthy Eating Food label reading, portion sizes and measuring food.;Carbohydrate counting;Meal options for control of blood glucose level and chronic complications.    Medications Other (comment)   insulin  pens and needles comparisons per patient questions   Monitoring Taught/discussed recording of test results and interpretation of SMBG.   average glucose and time in range     Individualized Goals (developed by patient)   Nutrition Follow meal plan discussed          Learning Objective:  Patient will have a greater understanding of diabetes self-management. Patient education  plan is to attend individual and/or group sessions per assessed needs and concerns.   Plan:   Patient Instructions  Keep making healthy food choices, great job! Keep carbs (total Carbohydrates on food label) close to 45 grams with each meal. Keep added sugars to 9 grams or less for each food.  When taking insulin  with the pen, push the button to inject, then hold the pen on skin for 5-10 seconds before pulling it away.    Expected Outcomes:  Demonstrated interest in learning. Expect positive outcomes   If problems or questions, patient to contact team via:  Phone or patient portal  Future DSME appointment: - 3-4 months patient requested November visit

## 2023-08-31 ENCOUNTER — Other Ambulatory Visit: Payer: Self-pay

## 2023-09-24 ENCOUNTER — Encounter (INDEPENDENT_AMBULATORY_CARE_PROVIDER_SITE_OTHER): Payer: Self-pay | Admitting: Nurse Practitioner

## 2023-09-24 ENCOUNTER — Ambulatory Visit (INDEPENDENT_AMBULATORY_CARE_PROVIDER_SITE_OTHER): Admitting: Nurse Practitioner

## 2023-09-24 VITALS — BP 127/68 | HR 75 | Ht 67.0 in | Wt 322.4 lb

## 2023-09-24 DIAGNOSIS — I10 Essential (primary) hypertension: Secondary | ICD-10-CM | POA: Diagnosis not present

## 2023-09-24 DIAGNOSIS — I890 Lymphedema, not elsewhere classified: Secondary | ICD-10-CM | POA: Diagnosis not present

## 2023-09-24 DIAGNOSIS — L03116 Cellulitis of left lower limb: Secondary | ICD-10-CM

## 2023-09-25 ENCOUNTER — Other Ambulatory Visit: Payer: Self-pay

## 2023-10-01 ENCOUNTER — Encounter (INDEPENDENT_AMBULATORY_CARE_PROVIDER_SITE_OTHER): Payer: Self-pay | Admitting: Nurse Practitioner

## 2023-10-01 NOTE — Progress Notes (Signed)
 Subjective:    Patient ID: Patrick Duncan, male    DOB: 24-May-1965, 58 y.o.   MRN: [id number] Chief Complaint  Patient presents with   Follow-up    The patient returns to the office for followup evaluation regarding leg swelling.  The swelling has persisted but with the lymph pump is under much, much better controlled. The pain associated with swelling is decreased. There have not been any interval development of a ulcerations or wounds.  He has had a recent development of cellulitis and is currently taking antibiotics.  The patient denies problems with the pump, noting it is working well and the leggings are in good condition.  Since the previous visit the patient has been wearing graduated compression stockings and using the lymph pump on a routine basis and  has noted significant improvement in the lymphedema.   Patient stated the lymph pump has been helpful with the treatment of the lymphedema.      Review of Systems  Cardiovascular:  Positive for leg swelling.  Musculoskeletal:  Positive for arthralgias.  Neurological:  Positive for weakness.  All other systems reviewed and are negative.      Objective:   Physical Exam Vitals reviewed.  Constitutional:      Appearance: He is obese.  HENT:     Head: Normocephalic.  Cardiovascular:     Rate and Rhythm: Normal rate.     Pulses: Normal pulses.  Pulmonary:     Effort: Pulmonary effort is normal.  Musculoskeletal:     Right lower leg: Edema present.     Left lower leg: Edema present.  Skin:    General: Skin is warm and dry.  Neurological:     Mental Status: He is alert and oriented to person, place, and time.     Gait: Gait abnormal.  Psychiatric:        Mood and Affect: Mood normal.        Behavior: Behavior normal.        Thought Content: Thought content normal.        Judgment: Judgment normal.     BP 127/68   Pulse 75   Ht 5' 7 (1.702 m)   Wt (!) 322 lb 6 oz (146.2 kg)   BMI 50.49 kg/m   Past  Medical History:  Diagnosis Date   Arthritis    Cellulitis and abscess of left leg    Depression    Diabetes mellitus without complication (HCC)    Hearing loss    History of IBS    HLD (hyperlipidemia)    Hypertension    IgA deficiency (HCC)    Morbid obesity (HCC)    Sleep apnea     Social History   Socioeconomic History   Marital status: Married    Spouse name: Not on file   Number of children: Not on file   Years of education: Not on file   Highest education level: Not on file  Occupational History   Not on file  Tobacco Use   Smoking status: Never   Smokeless tobacco: Never  Vaping Use   Vaping status: Never Used  Substance and Sexual Activity   Alcohol use: No   Drug use: No   Sexual activity: Not on file  Other Topics Concern   Not on file  Social History Narrative   Not on file   Social Drivers of Health   Financial Resource Strain: Low Risk  (08/06/2023)   Received from [facility]   Overall Financial Resource Strain (CARDIA)    Difficulty of Paying Living Expenses: Not hard at all  Food Insecurity: No Food Insecurity (08/06/2023)   Received from [facility]   Hunger Vital Sign    Within the past 12 months, you worried that your food would run out before you got the money to buy more.: Never true    Within the past 12 months, the food you bought just didn't last and you didn't have money to get more.: Never true  Transportation Needs: No Transportation Needs (08/06/2023)   Received from [facility] - Transportation    In the past 12 months, has lack of transportation kept you from medical appointments or from getting medications?: No    Lack of Transportation (Non-Medical): No  Physical Activity: Not on file  Stress: No Stress Concern Present (03/18/2021)   Received from [facility] - Occupational Stress Questionnaire    Feeling of Stress : Only  a little  Social Connections: Not on file  Intimate Partner Violence: Not on file    Past Surgical History:  Procedure Laterality Date   COLONOSCOPY WITH PROPOFOL  N/A 07/13/2020   Procedure: COLONOSCOPY WITH PROPOFOL ;  Surgeon: Maryruth Ole DASEN, MD;  Location: [facility] ENDOSCOPY;  Service: Endoscopy;  Laterality: N/A;   COLONOSCOPY WITH PROPOFOL  N/A 12/23/2020   Procedure: COLONOSCOPY WITH PROPOFOL ;  Surgeon: Jinny Carmine, MD;  Location: [facility] ENDOSCOPY;  Service: Endoscopy;  Laterality: N/A;   ESOPHAGOGASTRODUODENOSCOPY (EGD) WITH PROPOFOL  N/A 12/23/2020   Procedure: ESOPHAGOGASTRODUODENOSCOPY (EGD) WITH PROPOFOL ;  Surgeon: Jinny Carmine, MD;  Location: [facility] ENDOSCOPY;  Service: Endoscopy;  Laterality: N/A;   GIVENS CAPSULE STUDY N/A 12/29/2020   Procedure: GIVENS CAPSULE STUDY;  Surgeon: Unk Corinn Skiff, MD;  Location: [facility] ENDOSCOPY;  Service: Gastroenterology;  Laterality: N/A;   TOOTH EXTRACTION      Family History  Problem Relation Age of Onset   Benign prostatic hyperplasia Father    Psoriasis Father    Hypertension Mother    Hyperlipidemia Mother    Diabetes Maternal Grandmother    Diabetes Paternal Grandmother     Allergies  Allergen Reactions   Ampicillin Diarrhea   Bactrim  [Sulfamethoxazole -Trimethoprim ] Hives   Cephalexin     Claritin  [Loratadine ] Other (See Comments)    Irritates throat   Penicillins Rash       Latest Ref Rng & Units 03/30/2023    7:54 AM 01/30/2023    7:58 AM 11/30/2022    8:01 AM  CBC  WBC 4.0 - 10.5 K/uL 3.1  4.1  4.0   Hemoglobin 13.0 - 17.0 g/dL 88.4  88.6  88.7   Hematocrit 39.0 - 52.0 % 34.1  33.0  32.6   Platelets 150 - 400 K/uL 287  254  246       CMP     Component Value Date/Time   NA 131 (L) 03/30/2023 0754   K 3.9 03/30/2023 0754   CL 100 03/30/2023 0754   CO2 22 03/30/2023 0754   GLUCOSE 225 (H) 03/30/2023 0754   BUN 23 (H) 03/30/2023 0754   CREATININE 0.78 03/30/2023 0754   CALCIUM 8.9 03/30/2023 0754   PROT 7.4  03/30/2023 0754   ALBUMIN 4.2 03/30/2023 0754   AST 27 03/30/2023 0754   ALT 32 03/30/2023 0754   ALKPHOS 41 03/30/2023 0754   BILITOT 0.8 03/30/2023 0754   GFRNONAA >60 03/30/2023 0754     No  results found.     Assessment & Plan:   1. Lymphedema (Primary) Recommend:  No surgery or intervention at this point in time.    I have reviewed my discussion with the patient regarding lymphedema and why it  causes symptoms.  Patient will continue wearing graduated compression on a daily basis. The patient should put the compression on first thing in the morning and removing them in the evening. The patient should not sleep in the compression.   In addition, behavioral modification throughout the day will be continued.  This will include frequent elevation (such as in a recliner), use of over the counter pain medications as needed and exercise such as walking.  The systemic causes for chronic edema such as liver, kidney and cardiac etiologies does not appear to have significant changed over the past year.    The patient will continue aggressive use of the  lymph pump.  This will continue to improve the edema control and prevent sequela such as ulcers and infections.    2. Primary hypertension Continue antihypertensive medications as already ordered, these medications have been reviewed and there are no changes at this time.  3. Cellulitis of left lower extremity Patient will continue with antibiotics.  Patient advised not to utilize pump until cellulitis is resolved.   Current Outpatient Medications on File Prior to Visit  Medication Sig Dispense Refill   acarbose  (PRECOSE ) 25 MG tablet Take 25 mg by mouth 3 (three) times daily with meals.     acetaminophen  (TYLENOL ) 500 MG tablet Take 500 mg by mouth every 6 (six) hours as needed.     amLODipine  (NORVASC ) 10 MG tablet Take 10 mg by mouth daily.     Blood Pressure Monitor MISC Use 1 each once daily As directed     Continuous Glucose  Sensor (FREESTYLE LIBRE 3 PLUS SENSOR) MISC by Does not apply route. Change sensor every 15 days.     doxycycline  (VIBRAMYCIN ) 100 MG capsule Take 100 mg by mouth 2 (two) times daily.     fenofibrate  160 MG tablet Take 160 mg by mouth daily.     fexofenadine (ALLEGRA) 180 MG tablet Take 180 mg by mouth daily.     fluticasone  (FLONASE ) 50 MCG/ACT nasal spray Place 2 sprays into both nostrils daily as needed.     glimepiride  (AMARYL ) 4 MG tablet Take 4 mg by mouth daily with breakfast. (Patient taking differently: Take 4 mg by mouth 2 (two) times daily.)     HIBICLENS  4 % external liquid APPLY TOPICALLY DAILY AS NEEDED. TO WASHLEGS AND GROIN AREA 120 mL 3   Hydrocortisone -Aloe 1 % CREA Apply 1 application  topically 2 (two) times daily as needed (itching). To itchy area on legs     insulin  degludec (TRESIBA) 200 UNIT/ML FlexTouch Pen Inject 120 Units into the skin in the morning.     ketoconazole  (NIZORAL ) 2 % cream Apply to rash in groin once a day as needed for flares. 60 g 11   latanoprost (XALATAN) 0.005 % ophthalmic solution Place 1 drop into the right eye at bedtime.     lidocaine  (LMX) 4 % cream Apply 1 application topically as needed.     lovastatin (MEVACOR) 10 MG tablet Take 10 mg by mouth daily.     metFORMIN  (GLUCOPHAGE -XR) 500 MG 24 hr tablet Take 2 tablets by mouth 2 (two) times daily.     metoprolol  tartrate (LOPRESSOR ) 50 MG tablet Take 50 mg by mouth 2 (two) times daily.  montelukast (SINGULAIR) 10 MG tablet Take 10 mg by mouth at bedtime.     Multiple Vitamins-Minerals (MULTIVITAMIN WITH MINERALS) tablet Take 1 tablet by mouth daily.     mupirocin  ointment (BACTROBAN ) 2 % Apply to boils and ulcers QD PRN flares. 22 g 3   omeprazole (PRILOSEC) 20 MG capsule Take 20 mg by mouth daily.     pioglitazone  (ACTOS ) 30 MG tablet Take 30 mg by mouth daily.     ramipril  (ALTACE ) 10 MG capsule Take 10 mg by mouth 2 (two) times daily.     spironolactone (ALDACTONE) 50 MG tablet Take 50 mg  by mouth daily.     terazosin  (HYTRIN ) 10 MG capsule Take 10 mg by mouth daily.      torsemide  (DEMADEX ) 20 MG tablet Take 20 mg by mouth daily.     traZODone  (DESYREL ) 150 MG tablet Take 150 mg by mouth at bedtime.     trolamine salicylate (ASPERCREME) 10 % cream Apply 1 application  topically as needed for muscle pain.     vitamin B-12 (CYANOCOBALAMIN ) 1000 MCG tablet Take 1,000 mcg by mouth daily.     doxycycline  (MONODOX ) 100 MG capsule Take 1 capsule twice daily with food and drink when flared for 2 weeks. Prn for flares. (Patient not taking: Reported on 09/24/2023) 28 capsule 11   No current facility-administered medications on file prior to visit.    There are no Patient Instructions on file for this visit. No follow-ups on file.   Damien Batty E Milla Wahlberg, NP

## 2023-11-14 ENCOUNTER — Ambulatory Visit: Payer: Medicare HMO | Admitting: Dermatology

## 2023-11-14 DIAGNOSIS — Z79899 Other long term (current) drug therapy: Secondary | ICD-10-CM

## 2023-11-14 DIAGNOSIS — L732 Hidradenitis suppurativa: Secondary | ICD-10-CM

## 2023-11-14 DIAGNOSIS — L03119 Cellulitis of unspecified part of limb: Secondary | ICD-10-CM

## 2023-11-14 DIAGNOSIS — L304 Erythema intertrigo: Secondary | ICD-10-CM | POA: Diagnosis not present

## 2023-11-14 DIAGNOSIS — Z7189 Other specified counseling: Secondary | ICD-10-CM

## 2023-11-14 DIAGNOSIS — B353 Tinea pedis: Secondary | ICD-10-CM | POA: Diagnosis not present

## 2023-11-14 DIAGNOSIS — B351 Tinea unguium: Secondary | ICD-10-CM | POA: Diagnosis not present

## 2023-11-14 DIAGNOSIS — L98491 Non-pressure chronic ulcer of skin of other sites limited to breakdown of skin: Secondary | ICD-10-CM

## 2023-11-14 DIAGNOSIS — Z8619 Personal history of other infectious and parasitic diseases: Secondary | ICD-10-CM

## 2023-11-14 MED ORDER — DOXYCYCLINE MONOHYDRATE 100 MG PO CAPS: ORAL_CAPSULE | ORAL | 6 refills | Status: AC

## 2023-11-14 MED ORDER — MUPIROCIN 2 % EX OINT: TOPICAL_OINTMENT | CUTANEOUS | 11 refills | Status: AC

## 2023-11-14 MED ORDER — TERBINAFINE HCL 250 MG PO TABS: ORAL_TABLET | ORAL | 0 refills | Status: DC

## 2023-11-14 MED ORDER — KETOCONAZOLE 2 % EX CREA: TOPICAL_CREAM | CUTANEOUS | 11 refills | Status: AC

## 2023-11-14 NOTE — Patient Instructions (Signed)

## 2023-11-14 NOTE — Progress Notes (Signed)
 Follow-Up Visit   Subjective  VIRGINIO ISIDORE is a 58 y.o. male who presents for the following: Patient here for follow up on HS, Intertrigo, and history of cellulitis. Pt c/o rash on the feet for which he was seen by [facility] urgent care and prescribed permethrin cream to use from the neck down. He Pt states rash on the feet is still present.  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 Treated by provider at [facility].   The patient has spots, moles and lesions to be evaluated, some may be new or changing and the patient may have concern these could be cancer.   The following portions of the chart were reviewed this encounter and updated as appropriate: medications, allergies, medical history  Review of Systems:  No other skin or systemic complaints except as noted in HPI or Assessment and Plan.  Objective  Well appearing patient in no apparent distress; mood and affect are within normal limits.    A focused examination was performed of the following areas: Legs, groin, buttocks  Relevant exam findings are noted in the Assessment and Plan.    Assessment & Plan   History of Cellulitis of lower extremity, seen on 10/08/23 by [facility] urgent care and prescribed Doxycycline  and Mupirocin .  Feet and lower legs Restart Doxycycline  100mg  po BID x 2 weeks and Mupirocin  2% ointment BID.   Intertrigo groin, buttock Exam: Clear at exam Chronic and persistent condition with duration or expected duration over one year. Condition is improving with treatment but not currently at goal. Intertrigo is a chronic recurrent rash that occurs in skin fold areas that may be associated with friction; heat; moisture; yeast; fungus; and bacteria.  It is exacerbated by increased movement / activity; sweating; and higher atmospheric temperature.    Improved with current treatment. Continue ketoconazole  2% cream Apply to AAs QD prn rash dsp 60g 5Rf.   Hidradenitis  suppurativa Groin Exam: red plaque with crust  Chronic condition with duration or expected duration over one year. Currently well-controlled. Hidradenitis Suppurativa is a chronic; persistent; non-curable, but treatable condition due to abnormal inflamed sweat glands in the body folds (axilla, inframammary, groin, medial thighs), causing recurrent painful draining cysts and scarring. It can be associated with severe scarring acne and cysts; also abscesses and scarring of scalp. The goal is control and prevention of flares, as it is not curable. Scars are permanent and can be thickened. Treatment may include daily use of topical medication and oral antibiotics.  Oral isotretinoin may also be helpful.  For more severe cases, Humira (a biologic injection) may be prescribed to decrease the inflammatory process and prevent flares.  When indicated, inflamed cysts may also be treated surgically.    Start Doxycycline  100 mg po BID x 2 weeks for each episode of boils. #28 6RF. Doxycycline  should be taken with food to prevent nausea. Do not lay down for 30 minutes after taking. Be cautious with sun exposure and use good sun protection while on this medication. Pregnant women should not take this medication.   Start Mupirocin  2% ointment BID to active boils/scabs/sores until healed. 22g 11RF.   TINEA PEDIS - reviewed labs from 07/30/23, WNL             Exam: Scaling and maceration web spaces and over distal and lateral soles.  Treatment Plan: Start Lamisil 250 mg po QD x 1 mth. Terbinafine Counseling  Terbinafine is an anti-fungal medicine that can be applied to the  skin (over the counter) or taken by mouth (prescription) to treat fungal infections. The pill version is often used to treat fungal infections of the nails or scalp. While most people do not have any side effects from taking terbinafine pills, some possible side effects of the medicine can include taste changes, headache, loss of  smell, vision changes, nausea, vomiting, or diarrhea.   Rare side effects can include irritation of the liver, allergic reaction, or decrease in blood counts (which may show up as not feeling well or developing an infection). If you are concerned about any of these side effects, please stop the medicine and call your doctor, or in the case of an emergency such as feeling very unwell, seek immediate medical care.   Hx of scabies- tx by [facility] urgent [facility]. Pt was treated with topical, but his wife refused treatment.  Return in about 6 weeks (around 12/26/2023) for rash follow up.  LILLETTE Rosina Mayans, CMA, am acting as scribe for Alm Rhyme, MD .  Documentation: I have reviewed the above documentation for accuracy and completeness, and I agree with the above.  Alm Rhyme, MD

## 2023-11-20 ENCOUNTER — Encounter: Payer: Self-pay | Admitting: Dermatology

## 2023-11-29 ENCOUNTER — Encounter: Attending: Family Medicine | Admitting: Dietician

## 2023-11-29 DIAGNOSIS — Z794 Long term (current) use of insulin: Secondary | ICD-10-CM | POA: Insufficient documentation

## 2023-11-29 DIAGNOSIS — E119 Type 2 diabetes mellitus without complications: Secondary | ICD-10-CM | POA: Diagnosis present

## 2023-11-29 NOTE — Progress Notes (Signed)
 Diabetes Self-Management Education  Visit Type:  Follow-up  Appt. Start Time: 0830 Appt. End Time: 0950  11/29/2023  Mr. Patrick Duncan, identified by name and date of birth, is a 58 y.o. male with a diagnosis of Diabetes: type 2   ASSESSMENT  There were no vitals taken for this visit. There is no height or weight on file to calculate BMI.    Diabetes Self-Management Education - 11/29/23 9161       Dietary Intake   Breakfast plant-based eggs (Just eggs), sometimes with plant-based sausage (mother has egg allergy)    Snack (afternoon) occ whole wheat crackers with peanut butter    Beverage(s) water, occ diet soda or sugar free lemonade      Activity / Exercise   Activity / Exercise Type ADL's      Patient Education   Healthy Eating Plate Method;Food label reading, portion sizes and measuring food.;Other (comment)   weight management behavioral strategies   Monitoring Taught/discussed recording of test results and interpretation of SMBG.;Identified appropriate SMBG and/or A1C goals.    Acute complications Taught prevention, symptoms, and  treatment of hypoglycemia - the 15 rule.      Individualized Goals (developed by patient)   Nutrition Follow meal plan discussed      Patient Self-Evaluation of Goals - Patient rates self as meeting previously set goals (% of time)   Nutrition >75% (most of the time)      Outcomes   Program Status Completed      Subsequent Visit   Since your last visit have you experienced any weight changes? --   some weight fluctuation due to lymphedema         Learning Objective:  Patient will have a greater understanding of diabetes self-management. Patient education plan is to attend individual and/or group sessions per assessed needs and concerns.   Plan:   Patient Instructions  Keep working to follow the recommendations from the clinic in [place]. Meditation before eating can be just taking some slow, deep breaths to relax for a few  minutes. This can help with eating more slowly and not eating as much.  A plate with 3 sections helps to keep balanced nutrition for helping with calorie control and blood sugar. Use the big section for veggies, and the small sections for meat/eggs/ cheese and the other for your starchy food. If you want to keep up with calories, aim for 1500 daily (average of 500 calories for each meal), not more than 2000. (2000 calories every day would not lead to weight loss).    Expected Outcomes:  Demonstrated interest in learning. Expect positive outcomes  Education material provided:   If problems or questions, patient to contact team via:  Phone and patient portal  Future DSME appointment: -  prn

## 2023-11-29 NOTE — Patient Instructions (Signed)
 Keep working to follow the recommendations from the clinic in [place]. Meditation before eating can be just taking some slow, deep breaths to relax for a few minutes. This can help with eating more slowly and not eating as much.  A plate with 3 sections helps to keep balanced nutrition for helping with calorie control and blood sugar. Use the big section for veggies, and the small sections for meat/eggs/ cheese and the other for your starchy food. If you want to keep up with calories, aim for 1500 daily (average of 500 calories for each meal), not more than 2000. (2000 calories every day would not lead to weight loss).

## 2023-12-11 ENCOUNTER — Other Ambulatory Visit: Payer: Self-pay | Admitting: Dermatology

## 2024-01-08 ENCOUNTER — Encounter: Payer: Self-pay | Admitting: Dermatology

## 2024-01-08 ENCOUNTER — Ambulatory Visit: Admitting: Dermatology

## 2024-01-08 DIAGNOSIS — L817 Pigmented purpuric dermatosis: Secondary | ICD-10-CM | POA: Diagnosis not present

## 2024-01-08 DIAGNOSIS — Z7189 Other specified counseling: Secondary | ICD-10-CM

## 2024-01-08 DIAGNOSIS — L03119 Cellulitis of unspecified part of limb: Secondary | ICD-10-CM

## 2024-01-08 DIAGNOSIS — Z79899 Other long term (current) drug therapy: Secondary | ICD-10-CM

## 2024-01-08 DIAGNOSIS — B353 Tinea pedis: Secondary | ICD-10-CM

## 2024-01-08 DIAGNOSIS — I891 Lymphangitis: Secondary | ICD-10-CM

## 2024-01-08 DIAGNOSIS — B351 Tinea unguium: Secondary | ICD-10-CM

## 2024-01-08 DIAGNOSIS — I872 Venous insufficiency (chronic) (peripheral): Secondary | ICD-10-CM | POA: Diagnosis not present

## 2024-01-08 MED ORDER — TERBINAFINE HCL 250 MG PO TABS: ORAL_TABLET | ORAL | 1 refills | Status: AC

## 2024-01-08 MED ORDER — TRIAMCINOLONE ACETONIDE 0.1 % EX CREA: TOPICAL_CREAM | CUTANEOUS | 1 refills | Status: AC

## 2024-01-08 NOTE — Progress Notes (Signed)
 "  Follow-Up Visit   Subjective  Patrick Duncan is a 58 y.o. male who presents for the following: cellulitis follow up of the lower legs, pt restarted doxycycline  100 mg po BID about a week ago, and recently refilled Mupirocin  2% ointment. Pt is concerned cellulitis may have spread to the R leg and would like checked today. Pt did notice an improvement in nail discoloration when taking terbinafine , but has noticed it worsening again since he has been off of medication.  The following portions of the chart were reviewed this encounter and updated as appropriate: medications, allergies, medical history  Review of Systems:  No other skin or systemic complaints except as noted in HPI or Assessment and Plan.  Objective  Well appearing patient in no apparent distress; mood and affect are within normal limits.  A focused examination was performed of the following areas: the feet and lower legs  Relevant exam findings are noted in the Assessment and Plan.    Assessment & Plan   STASIS DERMATITIS with Schamberg's purpura Exam: Erythematous, scaly patches involving the ankle and distal lower leg with associated lower leg edema. Chronic and persistent condition with duration or expected duration over one year. Condition is bothersome/symptomatic for patient. Currently flared. Stasis in the legs causes chronic leg swelling, which may result in itchy or painful rashes, skin discoloration, skin texture changes, and sometimes ulceration.  Recommend daily graduated compression hose/stockings- easiest to put on first thing in morning, remove at bedtime.  Elevate legs as much as possible. Avoid salt/sodium rich foods. Treatment Plan: Start TMC 0.1% cream to aa's lower legs BID.  Hx of cellulitis of the lower legs -  Finish 2 weeks course of doxycycline  100 mg po BID. Doxycycline  should be taken with food to prevent nausea. Do not lay down for 30 minutes after taking. Be cautious with sun exposure and use  good sun protection while on this medication. Pregnant women should not take this medication.   Continue Mupirocin  2% ointment to aa BID.  Lymphangitis- R leg, pt allergic to PCN, will not tx with Cephalexin ,     Continue doxycycline  100 mg po BID until follow up appointment in one week.   TINEA PEDIS with tinea unguium - LFT WNL 12/31/23, pt tolerated terbinafine  well with no s/e. Tinea may be contributing to recurrent cellulitis                  TINEA PEDIS with tinea unguium - LFT WNL 12/31/23, pt tolerated terbinafine  well with no s/e. Tinea may be contributing to recurrent cellulitis Exam: Scaling and maceration web spaces and over distal and lateral soles.  Treatment Plan: Restart terbinafine  250 mg po QD x 2 more months. Terbinafine  Counseling  Terbinafine  is an anti-fungal medicine that can be applied to the skin (over the counter) or taken by mouth (prescription) to treat fungal infections. The pill version is often used to treat fungal infections of the nails or scalp. While most people do not have any side effects from taking terbinafine  pills, some possible side effects of the medicine can include taste changes, headache, loss of smell, vision changes, nausea, vomiting, or diarrhea.   Rare side effects can include irritation of the liver, allergic reaction, or decrease in blood counts (which may show up as not feeling well or developing an infection). If you are concerned about any of these side effects, please stop the medicine and call your doctor, or in the case of an emergency such as  feeling very unwell, seek immediate medical care.    Return for rash follow up in one week.  LILLETTE Rosina Mayans, CMA, am acting as scribe for Alm Rhyme, MD .   Documentation: I have reviewed the above documentation for accuracy and completeness, and I agree with the above.  Alm Rhyme, MD    "

## 2024-01-08 NOTE — Patient Instructions (Addendum)
 STASIS DERMATITIS with Schamberg's purpura Exam: Erythematous, scaly patches involving the ankle and distal lower leg with associated lower leg edema.  Chronic and persistent condition with duration or expected duration over one year. Condition is bothersome/symptomatic for patient. Currently flared.   Stasis in the legs causes chronic leg swelling, which may result in itchy or painful rashes, skin discoloration, skin texture changes, and sometimes ulceration.  Recommend daily graduated compression hose/stockings- easiest to put on first thing in morning, remove at bedtime.  Elevate legs as much as possible. Avoid salt/sodium rich foods.  Treatment Plan: Start triamcinolone  0.1% cream to aa's lower legs twice daily .  Hx of cellulitis of the lower legs -  Finish 2 weeks course of doxycycline  100 mg po BID. Doxycycline  should be taken with food to prevent nausea. Do not lay down for 30 minutes after taking. Be cautious with sun exposure and use good sun protection while on this medication. Pregnant women should not take this medication.   Continue Mupirocin  2% ointment to aa twice daily.  Restart terbinafine  250 mg po take one tablet once daily for two more months for toenail and foot fungus. Terbinafine  Counseling  Terbinafine  is an anti-fungal medicine that can be applied to the skin (over the counter) or taken by mouth (prescription) to treat fungal infections. The pill version is often used to treat fungal infections of the nails or scalp. While most people do not have any side effects from taking terbinafine  pills, some possible side effects of the medicine can include taste changes, headache, loss of smell, vision changes, nausea, vomiting, or diarrhea.   Rare side effects can include irritation of the liver, allergic reaction, or decrease in blood counts (which may show up as not feeling well or developing an infection). If you are concerned about any of these side effects, please stop the  medicine and call your doctor, or in the case of an emergency such as feeling very unwell, seek immediate medical care.     Due to recent changes in healthcare laws, you may see results of your pathology and/or laboratory studies on MyChart before the doctors have had a chance to review them. We understand that in some cases there may be results that are confusing or concerning to you. Please understand that not all results are received at the same time and often the doctors may need to interpret multiple results in order to provide you with the best plan of care or course of treatment. Therefore, we ask that you please give us  2 business days to thoroughly review all your results before contacting the office for clarification. Should we see a critical lab result, you will be contacted sooner.   If You Need Anything After Your Visit  If you have any questions or concerns for your doctor, please call our main line at [phone] and press option 4 to reach your doctor's medical assistant. If no one answers, please leave a voicemail as directed and we will return your call as soon as possible. Messages left after 4 pm will be answered the following business day.   You may also send us  a message via MyChart. We typically respond to MyChart messages within 1-2 business days.  For prescription refills, please ask your pharmacy to contact our office. Our fax number is [phone].  If you have an urgent issue when the clinic is closed that cannot wait until the next business day, you can page your doctor at the number below.  Please note that while we do our best to be available for urgent issues outside of office hours, we are not available 24/7.   If you have an urgent issue and are unable to reach us , you may choose to seek medical care at your doctor's office, retail clinic, urgent care center, or emergency room.  If you have a medical emergency, please immediately call 911 or go to the  emergency department.  Pager Numbers  - Dr. Hester: [phone]  - Dr. Jackquline: [phone]  - Dr. Claudene: [phone]   - Dr. Raymund: [phone]  In the event of inclement weather, please call our main line at [phone] for an update on the status of any delays or closures.  Dermatology Medication Tips: Please keep the boxes that topical medications come in in order to help keep track of the instructions about where and how to use these. Pharmacies typically print the medication instructions only on the boxes and not directly on the medication tubes.   If your medication is too expensive, please contact our office at [phone] option 4 or send us  a message through MyChart.   We are unable to tell what your co-pay for medications will be in advance as this is different depending on your insurance coverage. However, we may be able to find a substitute medication at lower cost or fill out paperwork to get insurance to cover a needed medication.   If a prior authorization is required to get your medication covered by your insurance company, please allow us  1-2 business days to complete this process.  Drug prices often vary depending on where the prescription is filled and some pharmacies may offer cheaper prices.  The website www.goodrx.com contains coupons for medications through different pharmacies. The prices here do not account for what the cost may be with help from insurance (it may be cheaper with your insurance), but the website can give you the price if you did not use any insurance.  - You can print the associated coupon and take it with your prescription to the pharmacy.  - You may also stop by our office during regular business hours and pick up a GoodRx coupon card.  - If you need your prescription sent electronically to a different pharmacy, notify our office through [facility] or by phone at [phone] option 4.     Si Usted Necesita Algo Despus de  Su Visita  Tambin puede enviarnos un mensaje a travs de Clinical Cytogeneticist. Por lo general respondemos a los mensajes de MyChart en el transcurso de 1 a 2 das hbiles.  Para renovar recetas, por favor pida a su farmacia que se ponga en contacto con nuestra oficina. Randi lakes de fax es [place] [phone].  Si tiene un asunto urgente cuando la clnica est cerrada y que no puede esperar hasta el siguiente da hbil, puede llamar/localizar a su doctor(a) al nmero que aparece a continuacin.   Por favor, tenga en cuenta que aunque hacemos todo lo posible para estar disponibles para asuntos urgentes fuera del horario de [place], no estamos disponibles las 24 horas del da, los 7 [address]  [address] de la [place].   Si tiene un problema urgente y no puede comunicarse con nosotros, puede optar por buscar atencin mdica  en el consultorio de su doctor(a), en una clnica privada, en un centro de atencin urgente o en una sala de emergencias.  Si tiene engineer, drilling, por favor llame inmediatamente al 911 o vaya a la sala de emergencias.  Nmeros  de bper  - Dr. Hester: [phone]  - Dra. Jackquline: [phone]  - Dr. Claudene: [phone]  - Dra. Kitts: [phone]  En caso de inclemencias del [place], por favor llame a nuestra lnea principal al [phone] para una actualizacin sobre el estado de cualquier retraso o cierre.  Consejos para la medicacin en dermatologa: Por favor, guarde las cajas en las que vienen los medicamentos de uso tpico para ayudarle a seguir las instrucciones sobre dnde y cmo usarlos. Las farmacias generalmente imprimen las instrucciones del medicamento slo en las cajas y no directamente en los tubos del [place].   Si su medicamento es muy caro, por favor, pngase en contacto con landry rieger llamando al [phone] y presione la opcin 4 o envenos un mensaje a travs de Clinical Cytogeneticist.   No podemos decirle cul ser su copago por los medicamentos por adelantado ya que  esto es diferente dependiendo de la cobertura de su seguro. Sin embargo, es posible que podamos encontrar un medicamento sustituto a audiological scientist un formulario para que el seguro cubra el medicamento que se considera necesario.   Si se requiere una autorizacin previa para que su compaa de seguros cubra su medicamento, por favor permtanos de 1 a 2 das hbiles para completar este proceso.  Los precios de los medicamentos varan con frecuencia dependiendo del environmental consultant de dnde se surte la receta y alguna farmacias pueden ofrecer precios ms baratos.  El sitio web www.goodrx.com tiene cupones para medicamentos de health and safety inspector. Los precios aqu no tienen en cuenta lo que podra costar con la ayuda del seguro (puede ser ms barato con su seguro), pero el sitio web puede darle el precio si no utiliz tourist information centre manager.  - Puede imprimir el cupn correspondiente y llevarlo con su receta a la farmacia.  - Tambin puede pasar por nuestra oficina durante el horario de atencin regular y education officer, museum una tarjeta de cupones de GoodRx.  - Si necesita que su receta se enve electrnicamente a una farmacia diferente, informe a nuestra oficina a travs de MyChart de  o por telfono llamando al [phone] y presione la opcin 4.

## 2024-01-15 ENCOUNTER — Ambulatory Visit: Admitting: Dermatology

## 2024-01-15 DIAGNOSIS — I872 Venous insufficiency (chronic) (peripheral): Secondary | ICD-10-CM

## 2024-01-15 DIAGNOSIS — L817 Pigmented purpuric dermatosis: Secondary | ICD-10-CM

## 2024-01-15 DIAGNOSIS — I8311 Varicose veins of right lower extremity with inflammation: Secondary | ICD-10-CM

## 2024-01-15 DIAGNOSIS — L03119 Cellulitis of unspecified part of limb: Secondary | ICD-10-CM

## 2024-01-15 NOTE — Patient Instructions (Addendum)
 " Continue keeping legs elevated  Continue triamcinolone  cream - use twice daily to affected areas morning and night  Topical steroids (such as triamcinolone , fluocinolone, fluocinonide, mometasone, clobetasol, halobetasol, betamethasone, hydrocortisone ) can cause thinning and lightening of the skin if they are used for too long in the same area. Your physician has selected the right strength medicine for your problem and area affected on the body. Please use your medication only as directed by your physician to prevent side effects.   Continue mupirocin  2 % ointment as needed for any open areas      Due to recent changes in healthcare laws, you may see results of your pathology and/or laboratory studies on MyChart before the doctors have had a chance to review them. We understand that in some cases there may be results that are confusing or concerning to you. Please understand that not all results are received at the same time and often the doctors may need to interpret multiple results in order to provide you with the best plan of care or course of treatment. Therefore, we ask that you please give us  2 business days to thoroughly review all your results before contacting the office for clarification. Should we see a critical lab result, you will be contacted sooner.   If You Need Anything After Your Visit  If you have any questions or concerns for your doctor, please call our main line at [phone] and press option 4 to reach your doctor's medical assistant. If no one answers, please leave a voicemail as directed and we will return your call as soon as possible. Messages left after 4 pm will be answered the following business day.   You may also send us  a message via MyChart. We typically respond to MyChart messages within 1-2 business days.  For prescription refills, please ask your pharmacy to contact our office. Our fax number is [phone].  If you have an urgent issue when the  clinic is closed that cannot wait until the next business day, you can page your doctor at the number below.    Please note that while we do our best to be available for urgent issues outside of office hours, we are not available 24/7.   If you have an urgent issue and are unable to reach us , you may choose to seek medical care at your doctor's office, retail clinic, urgent care center, or emergency room.  If you have a medical emergency, please immediately call 911 or go to the emergency department.  Pager Numbers  - Dr. Hester: [phone]  - Dr. Jackquline: [phone]  - Dr. Claudene: [phone]   - Dr. Raymund: [phone]  In the event of inclement weather, please call our main line at [phone] for an update on the status of any delays or closures.  Dermatology Medication Tips: Please keep the boxes that topical medications come in in order to help keep track of the instructions about where and how to use these. Pharmacies typically print the medication instructions only on the boxes and not directly on the medication tubes.   If your medication is too expensive, please contact our office at [phone] option 4 or send us  a message through MyChart.   We are unable to tell what your co-pay for medications will be in advance as this is different depending on your insurance coverage. However, we may be able to find a substitute medication at lower cost or fill out paperwork to get insurance to cover a needed medication.  If a prior authorization is required to get your medication covered by your insurance company, please allow us  1-2 business days to complete this process.  Drug prices often vary depending on where the prescription is filled and some pharmacies may offer cheaper prices.  The website www.goodrx.com contains coupons for medications through different pharmacies. The prices here do not account for what the cost may be with help from insurance (it may be cheaper  with your insurance), but the website can give you the price if you did not use any insurance.  - You can print the associated coupon and take it with your prescription to the pharmacy.  - You may also stop by our office during regular business hours and pick up a GoodRx coupon card.  - If you need your prescription sent electronically to a different pharmacy, notify our office through [facility] or by phone at [phone] option 4.     Si Usted Necesita Algo Despus de Su Visita  Tambin puede enviarnos un mensaje a travs de Clinical Cytogeneticist. Por lo general respondemos a los mensajes de MyChart en el transcurso de 1 a 2 das hbiles.  Para renovar recetas, por favor pida a su farmacia que se ponga en contacto con nuestra oficina. Randi lakes de fax es [place] [phone].  Si tiene un asunto urgente cuando la clnica est cerrada y que no puede esperar hasta el siguiente da hbil, puede llamar/localizar a su doctor(a) al nmero que aparece a continuacin.   Por favor, tenga en cuenta que aunque hacemos todo lo posible para estar disponibles para asuntos urgentes fuera del horario de [place], no estamos disponibles las 24 horas del da, los 7 [address] de la [place].   Si tiene un problema urgente y no puede comunicarse con nosotros, puede optar por buscar atencin mdica  en el consultorio de su doctor(a), en una clnica privada, en un centro de atencin urgente o en una sala de emergencias.  Si tiene engineer, drilling, por favor llame inmediatamente al 911 o vaya a la sala de emergencias.  Nmeros de bper  - Dr. Hester: [phone]  - Dra. Jackquline: [phone]  - Dr. Claudene: [phone]  - Dra. Kitts: [phone]  En caso de inclemencias del [place], por favor llame a nuestra lnea principal al [phone] para una actualizacin sobre el estado de cualquier retraso o cierre.  Consejos para la medicacin en dermatologa: Por favor, guarde las cajas en las que vienen los  medicamentos de uso tpico para ayudarle a seguir las instrucciones sobre dnde y cmo usarlos. Las farmacias generalmente imprimen las instrucciones del medicamento slo en las cajas y no directamente en los tubos del [place].   Si su medicamento es muy caro, por favor, pngase en contacto con landry rieger llamando al [phone] y presione la opcin 4 o envenos un mensaje a travs de Clinical Cytogeneticist.   No podemos decirle cul ser su copago por los medicamentos por adelantado ya que esto es diferente dependiendo de la cobertura de su seguro. Sin embargo, es posible que podamos encontrar un medicamento sustituto a audiological scientist un formulario para que el seguro cubra el medicamento que se considera necesario.   Si se requiere una autorizacin previa para que su compaa de seguros cubra su medicamento, por favor permtanos de 1 a 2 das hbiles para completar este proceso.  Los precios de los medicamentos varan con frecuencia dependiendo del environmental consultant de dnde se surte la receta y alguna farmacias pueden ofrecer precios ms baratos.  El sitio web www.goodrx.com tiene cupones para medicamentos de health and safety inspector. Los precios aqu no tienen en cuenta lo que podra costar con la ayuda del seguro (puede ser ms barato con su seguro), pero el sitio web puede darle el precio si no utiliz tourist information centre manager.  - Puede imprimir el cupn correspondiente y llevarlo con su receta a la farmacia.  - Tambin puede pasar por nuestra oficina durante el horario de atencin regular y education officer, museum una tarjeta de cupones de GoodRx.  - Si necesita que su receta se enve electrnicamente a una farmacia diferente, informe a nuestra oficina a travs de MyChart de [place] o por telfono llamando al [phone] y presione la opcin 4.  "

## 2024-01-15 NOTE — Progress Notes (Unsigned)
" ° °  Follow-Up Visit   Subjective  Patrick Duncan is a 58 y.o. male who presents for the following: Patient here today for 1 week follow up on rash at b/l legs. Patient history of cellulitis / stasis dermatitis with schaumbergs purpura and lymphadenitis. Patient finished course of 2 week dose of doxycycline  100 mg bid. Has been using tmc 0.1 cream to lower extremities and mupirocin . Reports still itchy but has noticed small improvement. Unable to wear compression stockings due to discomfort but wearing diabetic socks.   The following portions of the chart were reviewed this encounter and updated as appropriate: medications, allergies, medical history  Review of Systems:  No other skin or systemic complaints except as noted in HPI or Assessment and Plan.  Objective  Well appearing patient in no apparent distress; mood and affect are within normal limits.   A focused examination was performed of the following areas: Lower legs   Relevant exam findings are noted in the Assessment and Plan. Stasis Dermatitis with Schaumberg's Purpura / hx of cellulitis                              Assessment & Plan   STASIS DERMATITIS with Schamberg's purpura No evidence of infection noted at today's exam   Exam: Erythematous, scaly patches involving the ankle and distal lower leg with associated lower leg edema. See photos  Chronic and persistent condition with duration or expected duration over one year. Condition is symptomatic/ bothersome to patient. Not currently at goal but much improved see photos   Stasis in the legs causes chronic leg swelling, which may result in itchy or painful rashes, skin discoloration, skin texture changes, and sometimes ulceration.  Recommend daily graduated compression hose/stockings- easiest to put on first thing in morning, remove at bedtime.  Elevate legs as much as possible. Avoid salt/sodium rich foods.  Treatment Plan: Continue Elevating  legs  Avoid scratching  Continue TMC 0.1% cream to aa's lower legs BID in morning and at night  Continue Mupirocin  2% ointment to aa BID.   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 Larger vessels may be covered if symptomatic and we would refer to vascular surgeon if treatment desired.    No follow-ups on file.  I, Eleanor Blush, CMA, am acting as scribe for Rexene Rattler, MD.   Documentation: I have reviewed the above documentation for accuracy and completeness, and I agree with the above.  Rexene Rattler, MD    "

## 2024-02-11 ENCOUNTER — Ambulatory Visit: Admitting: Dermatology

## 2024-02-12 ENCOUNTER — Ambulatory Visit: Admitting: Dermatology

## 2024-02-28 ENCOUNTER — Ambulatory Visit: Admitting: Dermatology

## 2024-09-23 ENCOUNTER — Ambulatory Visit (INDEPENDENT_AMBULATORY_CARE_PROVIDER_SITE_OTHER): Admitting: Nurse Practitioner
